# Patient Record
Sex: Male | Born: 1935 | Race: White | Hispanic: No | Smoking: Former smoker
Health system: Southern US, Community
[De-identification: ages and names within clinical notes are randomized; demographics above are authoritative.]

## PROBLEM LIST (undated history)

## (undated) DIAGNOSIS — IMO0001 Reserved for inherently not codable concepts without codable children: Secondary | ICD-10-CM

## (undated) DIAGNOSIS — E785 Hyperlipidemia, unspecified: Secondary | ICD-10-CM

## (undated) DIAGNOSIS — I10 Essential (primary) hypertension: Secondary | ICD-10-CM

## (undated) DIAGNOSIS — M1990 Unspecified osteoarthritis, unspecified site: Secondary | ICD-10-CM

## (undated) DIAGNOSIS — I2510 Atherosclerotic heart disease of native coronary artery without angina pectoris: Secondary | ICD-10-CM

## (undated) DIAGNOSIS — Z5189 Encounter for other specified aftercare: Secondary | ICD-10-CM

## (undated) DIAGNOSIS — C801 Malignant (primary) neoplasm, unspecified: Secondary | ICD-10-CM

## (undated) DIAGNOSIS — I739 Peripheral vascular disease, unspecified: Secondary | ICD-10-CM

## (undated) DIAGNOSIS — N289 Disorder of kidney and ureter, unspecified: Secondary | ICD-10-CM

## (undated) DIAGNOSIS — Z8613 Personal history of malaria: Secondary | ICD-10-CM

## (undated) HISTORY — DX: Disorder of kidney and ureter, unspecified: N28.9

## (undated) HISTORY — DX: Personal history of malaria: Z86.13

## (undated) HISTORY — DX: Hyperlipidemia, unspecified: E78.5

## (undated) HISTORY — PX: PACEMAKER INSERTION: SHX728

## (undated) HISTORY — DX: Essential (primary) hypertension: I10

## (undated) HISTORY — PX: INSERT / REPLACE / REMOVE PACEMAKER: SUR710

## (undated) HISTORY — PX: SPINE SURGERY: SHX786

## (undated) HISTORY — DX: Peripheral vascular disease, unspecified: I73.9

---
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
## 1999-02-05 HISTORY — PX: CORONARY ARTERY BYPASS GRAFT: SHX141

## 2009-02-04 HISTORY — PX: FEMORAL BYPASS: SHX50

## 2010-05-18 ENCOUNTER — Encounter: Payer: Medicare Other | Admitting: Cardiothoracic Surgery

## 2010-06-05 ENCOUNTER — Encounter: Payer: Medicare Other | Admitting: Cardiothoracic Surgery

## 2010-07-06 ENCOUNTER — Encounter: Payer: Medicare Other | Admitting: Cardiothoracic Surgery

## 2010-09-11 ENCOUNTER — Ambulatory Visit: Payer: Medicare Other | Admitting: Physical Medicine and Rehabilitation

## 2010-09-28 ENCOUNTER — Ambulatory Visit (INDEPENDENT_AMBULATORY_CARE_PROVIDER_SITE_OTHER): Payer: Medicare Other | Admitting: Internal Medicine

## 2010-09-28 ENCOUNTER — Encounter: Payer: Self-pay | Admitting: Internal Medicine

## 2010-09-28 DIAGNOSIS — E785 Hyperlipidemia, unspecified: Secondary | ICD-10-CM | POA: Insufficient documentation

## 2010-09-28 DIAGNOSIS — E1151 Type 2 diabetes mellitus with diabetic peripheral angiopathy without gangrene: Secondary | ICD-10-CM | POA: Insufficient documentation

## 2010-09-28 DIAGNOSIS — E1140 Type 2 diabetes mellitus with diabetic neuropathy, unspecified: Secondary | ICD-10-CM | POA: Insufficient documentation

## 2010-09-28 DIAGNOSIS — E1340 Other specified diabetes mellitus with diabetic neuropathy, unspecified: Secondary | ICD-10-CM

## 2010-09-28 DIAGNOSIS — I10 Essential (primary) hypertension: Secondary | ICD-10-CM

## 2010-09-28 DIAGNOSIS — IMO0001 Reserved for inherently not codable concepts without codable children: Secondary | ICD-10-CM

## 2010-09-28 DIAGNOSIS — E119 Type 2 diabetes mellitus without complications: Secondary | ICD-10-CM

## 2010-09-28 DIAGNOSIS — M48061 Spinal stenosis, lumbar region without neurogenic claudication: Secondary | ICD-10-CM | POA: Insufficient documentation

## 2010-09-28 DIAGNOSIS — Z79899 Other long term (current) drug therapy: Secondary | ICD-10-CM

## 2010-09-28 DIAGNOSIS — Z1211 Encounter for screening for malignant neoplasm of colon: Secondary | ICD-10-CM | POA: Insufficient documentation

## 2010-09-28 DIAGNOSIS — E1351 Other specified diabetes mellitus with diabetic peripheral angiopathy without gangrene: Secondary | ICD-10-CM | POA: Insufficient documentation

## 2010-09-28 DIAGNOSIS — I240 Acute coronary thrombosis not resulting in myocardial infarction: Secondary | ICD-10-CM

## 2010-09-28 NOTE — Patient Instructions (Addendum)
You may increase your hydrocodone usage to 3 daily, or even 4 if you need to get your pain under control.  Start with adding  one at bedtime , and then one during the day if needed.  You can add dulcolax or ex lax if needed for constipation to your prune intake.

## 2010-09-28 NOTE — Progress Notes (Signed)
  Subjective:    Patient ID: [name], male    DOB: [birth year], 77 y.o.   MRN: [id number]  HPI 77 yr old male presents for establishment of primary care.  He has no chief complaint today, but has chronic low back pain due to  spinal stenosiswhich has been treated medically, with prior gabapentin trial, now taking vicodin now with persistent back pain radiating to both legs  HIs medications are prescribed by Dr. Loistine Chance the new neurosurgon at [place].   He has been prescribed 3 daily but has only been taking 2 daily and his pain is not controlled.  He has not been sleeping well due to pain he  Denies urinary retention and constipation.  Review of Systems  Constitutional: Negative for fever, chills, diaphoresis, activity change, appetite change, fatigue and unexpected weight change.  HENT: Positive for hearing loss. Negative for ear pain, nosebleeds, congestion, sore throat, facial swelling, rhinorrhea, sneezing, drooling, mouth sores, trouble swallowing, neck pain, neck stiffness, dental problem, voice change, postnasal drip, sinus pressure, tinnitus and ear discharge.   Eyes: Negative for photophobia, pain, discharge, redness, itching and visual disturbance.  Respiratory: Negative for apnea, cough, choking, chest tightness, shortness of breath, wheezing and stridor.   Cardiovascular: Negative for chest pain, palpitations and leg swelling.  Gastrointestinal: Negative for nausea, vomiting, abdominal pain, diarrhea, constipation, blood in stool, abdominal distention, anal bleeding and rectal pain.  Genitourinary: Negative for dysuria, urgency, frequency, hematuria, flank pain, decreased urine volume, scrotal swelling, difficulty urinating and testicular pain.  Musculoskeletal: Positive for back pain. Negative for myalgias, joint swelling, arthralgias and gait problem.  Skin: Negative for color change, rash and wound.  Neurological: Negative for dizziness, tremors, seizures, syncope, speech  difficulty, weakness, light-headedness, numbness and headaches.  Psychiatric/Behavioral: Negative for suicidal ideas, hallucinations, behavioral problems, confusion, sleep disturbance, dysphoric mood, decreased concentration and agitation. The patient is not nervous/anxious.        Objective:   Physical Exam  Constitutional: He is oriented to person, place, and time. He appears well-developed and well-nourished.  HENT:  Head: Normocephalic.  Eyes: EOM are normal. Pupils are equal, round, and reactive to light.  Neck: Normal range of motion. Neck supple. No thyromegaly present.  Cardiovascular: Normal rate, regular rhythm and normal heart sounds.   Pulmonary/Chest: Effort normal and breath sounds normal.  Abdominal: Soft. Bowel sounds are normal.  Musculoskeletal: Normal range of motion.  Neurological: He is alert and oriented to person, place, and time.  Skin: Skin is warm and dry.  Psychiatric: He has a normal mood and affect.          Assessment & Plan:

## 2010-09-29 NOTE — Assessment & Plan Note (Signed)
He does not c remember the value of his last a1c which was one month ago.  Sugars running 150 to 200.   No changes today, repeat A1c in 3 months.  Up to date on eye exams

## 2010-09-29 NOTE — Assessment & Plan Note (Addendum)
Currently asymptomatic, tolerating statin ,ARB plavix , and beta blocker.

## 2010-09-29 NOTE — Assessment & Plan Note (Signed)
Well controlled currently on current medications

## 2010-09-29 NOTE — Assessment & Plan Note (Signed)
With persistent pain radiating to both legs,  Not controlled on two vicodin daily.  Discussed increasing his dose to three or 4 tablets daily

## 2010-09-29 NOTE — Assessment & Plan Note (Signed)
Prior gabapentin trial was apparently ineffective.  Symptoms are mild.

## 2010-10-30 ENCOUNTER — Other Ambulatory Visit: Payer: Self-pay | Admitting: Internal Medicine

## 2010-10-31 MED ORDER — CYCLOBENZAPRINE HCL 10 MG PO TABS: 10.0000 mg | ORAL_TABLET | Freq: Every day | ORAL | Status: DC

## 2010-10-31 MED ORDER — ATORVASTATIN CALCIUM 20 MG PO TABS: 20.0000 mg | ORAL_TABLET | Freq: Every day | ORAL | Status: DC

## 2010-11-13 ENCOUNTER — Other Ambulatory Visit (HOSPITAL_COMMUNITY): Payer: Self-pay | Admitting: Neurosurgery

## 2010-11-13 DIAGNOSIS — M48061 Spinal stenosis, lumbar region without neurogenic claudication: Secondary | ICD-10-CM

## 2010-12-18 ENCOUNTER — Ambulatory Visit (HOSPITAL_COMMUNITY)
Admission: RE | Admit: 2010-12-18 | Discharge: 2010-12-18 | Disposition: A | Discharge status: Home / Self Care | Payer: Medicare Other | Source: Ambulatory Visit | Attending: Neurosurgery | Admitting: Neurosurgery

## 2010-12-18 DIAGNOSIS — M51379 Other intervertebral disc degeneration, lumbosacral region without mention of lumbar back pain or lower extremity pain: Secondary | ICD-10-CM | POA: Insufficient documentation

## 2010-12-18 DIAGNOSIS — M519 Unspecified thoracic, thoracolumbar and lumbosacral intervertebral disc disorder: Secondary | ICD-10-CM | POA: Insufficient documentation

## 2010-12-18 DIAGNOSIS — M545 Low back pain, unspecified: Secondary | ICD-10-CM | POA: Insufficient documentation

## 2010-12-18 DIAGNOSIS — M48061 Spinal stenosis, lumbar region without neurogenic claudication: Secondary | ICD-10-CM | POA: Insufficient documentation

## 2010-12-18 DIAGNOSIS — M5137 Other intervertebral disc degeneration, lumbosacral region: Secondary | ICD-10-CM | POA: Insufficient documentation

## 2010-12-18 LAB — GLUCOSE, CAPILLARY
Glucose-Capillary: 166 mg/dL — ABNORMAL HIGH (ref 70–99)
Glucose-Capillary: 144 mg/dL — ABNORMAL HIGH (ref 70–99)

## 2010-12-18 MED ORDER — IOHEXOL 180 MG/ML  SOLN
20.0000 mL | Freq: Once | INTRAMUSCULAR | Status: AC | PRN
  Administered 2010-12-18: 20 mL via INTRATHECAL

## 2010-12-18 MED ORDER — OXYCODONE-ACETAMINOPHEN 5-325 MG PO TABS
ORAL_TABLET | ORAL | Status: AC
  Administered 2010-12-18: 1 via GASTROSTOMY
  Filled 2010-12-18: qty 1

## 2010-12-18 MED ORDER — MORPHINE SULFATE 2 MG/ML IJ SOLN: 2.0000 mg | INTRAMUSCULAR | Status: DC | PRN

## 2010-12-18 MED ORDER — OXYCODONE-ACETAMINOPHEN 5-325 MG PO TABS: 1.0000 | ORAL_TABLET | ORAL | Status: DC | PRN

## 2010-12-18 MED ORDER — DIAZEPAM 5 MG PO TABS
10.0000 mg | ORAL_TABLET | Freq: Once | ORAL | Status: AC
  Administered 2010-12-18: 10 mg via ORAL

## 2010-12-18 NOTE — Progress Notes (Signed)
Family at bedside.  Taking po's

## 2010-12-18 NOTE — Procedures (Signed)
Brief history: The patient complains of back pain.  Procedure: Lumbar myelogram  Surgeon: Dr. Earle Gell  Asst.: None  Level injected:l5/s1  Dye injected: 15 cc of Omnipaque 180  CSF appearance: Clear  Estimated blood loss minimal  Complications: None

## 2010-12-18 NOTE — H&P (Addendum)
  Brief history: The patient is an 77 year old white male with back hip and leg pain.  Past medical history, past surgical history, present medications, allergies, family medical history, social history: As per 11/13/2010 office note   Physical exam: Has not changed and is as per his 11/13/2010 physical  Assessment and plan: Lumbar spinal stenosis lumbago neurogenic claudication. I discussed situation with patient. We discussed further workup with a lumbar myelo CT. I've explained the procedure the risks benefits and alternatives as well as expected outcome of the procedure. The patient was to proceed.

## 2010-12-31 ENCOUNTER — Other Ambulatory Visit: Payer: Self-pay | Admitting: Neurosurgery

## 2010-12-31 ENCOUNTER — Ambulatory Visit (INDEPENDENT_AMBULATORY_CARE_PROVIDER_SITE_OTHER): Payer: Medicare Other | Admitting: Internal Medicine

## 2010-12-31 ENCOUNTER — Encounter: Payer: Self-pay | Admitting: Internal Medicine

## 2010-12-31 DIAGNOSIS — N183 Chronic kidney disease, stage 3 unspecified: Secondary | ICD-10-CM

## 2010-12-31 DIAGNOSIS — E1121 Type 2 diabetes mellitus with diabetic nephropathy: Secondary | ICD-10-CM | POA: Insufficient documentation

## 2010-12-31 DIAGNOSIS — E1349 Other specified diabetes mellitus with other diabetic neurological complication: Secondary | ICD-10-CM

## 2010-12-31 DIAGNOSIS — E1142 Type 2 diabetes mellitus with diabetic polyneuropathy: Secondary | ICD-10-CM

## 2010-12-31 DIAGNOSIS — I739 Peripheral vascular disease, unspecified: Secondary | ICD-10-CM | POA: Insufficient documentation

## 2010-12-31 DIAGNOSIS — E119 Type 2 diabetes mellitus without complications: Secondary | ICD-10-CM

## 2010-12-31 DIAGNOSIS — E1340 Other specified diabetes mellitus with diabetic neuropathy, unspecified: Secondary | ICD-10-CM

## 2010-12-31 DIAGNOSIS — E785 Hyperlipidemia, unspecified: Secondary | ICD-10-CM

## 2010-12-31 DIAGNOSIS — E1149 Type 2 diabetes mellitus with other diabetic neurological complication: Secondary | ICD-10-CM

## 2010-12-31 MED ORDER — OXYCODONE-ACETAMINOPHEN 7.5-325 MG PO TABS: 1.0000 | ORAL_TABLET | ORAL | Status: DC | PRN

## 2010-12-31 NOTE — Patient Instructions (Addendum)
Try the percocet up to 4 daily for pain .   Please use dulcolax  ( a stimulant laxative,  or Ex L ax or Sennakot)   every night to prevent constipation IF you use  the percocet.   Pick up a small paperback that estimates the carbohydrate content of common foods.   We are checking an LDL and your HgbA1c today.  Please send me a log of your last 2 weeks of blood sugars so we can make some adjustments to your insulin if needed..  My goals is to reduce your injections to twice daily and to keep your fasting sugars between 120 to 150,  2 hour Post prandials of 120 to 150 as well

## 2010-12-31 NOTE — Progress Notes (Signed)
Subjective:    Patient ID: [name], male    DOB: [birth year], 77 y.o.   MRN: [id number]  HPI  77 yo white male with history of chronic back pain secondary to spinal stenosis here for followup on medical conditions which include diabetes.  He had a recent positive myelogram and is scheduled for back surgery  by Earle Gell on Dec 12th .  His pain is currently not relieved with vicodin.  His only comfortable position is sitting in an upright chair.  He has been sleeping in a recliner.  He denies bowel or bladder incontinence. Has one or two bowel movements daily normally. He hs had preoperative clearance by his cardiologist.  His second issue is Diabetes.  His sugars have been reported as uncontrolled because he has been following the advice of his daughter who is a physician and  has been advocating looser glycemic control.  His fasting glucoses have been averaging from 180s to 200s and he has been using a sliding scale insulin regimen of 70/30 18 units three times daily .    He saw his cardiologist Paraschos in October for preoperative clearance.  Received flu shot at [facility]. Pneumonia vaccine has been at least twice.  Recalls having a tetanus shot in [place] before his move from [place].   Past Medical History  Diagnosis Date  . H/O: malaria 78    in [place]  . Hyperlipidemia   . Hypertension   . Diabetes mellitus   . Renal insufficiency     by Nov 2012 labs, no old records available  . Peripheral artery disease     folowed by [place] bypass 2011 [place]   Current Outpatient Prescriptions on File Prior to Visit  Medication Sig Dispense Refill  . amLODipine (NORVASC) 10 MG tablet Take 10 mg by mouth daily.        Marland Kitchen aspirin 325 MG tablet Take 325 mg by mouth daily.        Marland Kitchen atorvastatin (LIPITOR) 20 MG tablet Take 1 tablet (20 mg total) by mouth daily.  90 tablet  2  . Calcium Carbonate-Vitamin D (CALCIUM + D) 600-200 MG-UNIT TABS Take 1 tablet by mouth 2 (two) times  daily.        . carbamazepine (TEGRETOL) 200 MG tablet Take 200 mg by mouth daily.        . clopidogrel (PLAVIX) 75 MG tablet Take 75 mg by mouth daily.        . cyclobenzaprine (FLEXERIL) 10 MG tablet Take 1 tablet (10 mg total) by mouth daily.  90 tablet  0  . fenofibrate (TRICOR) 48 MG tablet Take 48 mg by mouth daily.        . Insulin Aspart Prot & Aspart (NOVOLOG MIX 70/30 FLEXPEN [place]) Inject 17 Units into the skin 3 (three) times daily before meals.        . metoprolol (LOPRESSOR) 50 MG tablet Take 50 mg by mouth 2 (two) times daily.        . valsartan (DIOVAN) 160 MG tablet Take 160 mg by mouth daily.           Review of Systems  Constitutional: Negative for fever, chills, diaphoresis, activity change, appetite change, fatigue and unexpected weight change.  HENT: Positive for hearing loss. Negative for ear pain, nosebleeds, congestion, sore throat, facial swelling, rhinorrhea, sneezing, drooling, mouth sores, trouble swallowing, neck pain, neck stiffness, dental problem, voice change, postnasal drip, sinus pressure, tinnitus and ear discharge.  Eyes: Negative for photophobia, pain, discharge, redness, itching and visual disturbance.  Respiratory: Negative for apnea, cough, choking, chest tightness, shortness of breath, wheezing and stridor.   Cardiovascular: Negative for chest pain, palpitations and leg swelling.  Gastrointestinal: Negative for nausea, vomiting, abdominal pain, diarrhea, constipation, blood in stool, abdominal distention, anal bleeding and rectal pain.  Genitourinary: Negative for dysuria, urgency, frequency, hematuria, flank pain, decreased urine volume, scrotal swelling, difficulty urinating and testicular pain.  Musculoskeletal: Positive for back pain. Negative for myalgias, joint swelling, arthralgias and gait problem.  Skin: Negative for color change, rash and wound.  Neurological: Negative for dizziness, tremors, seizures, syncope, speech difficulty, weakness,  light-headedness, numbness and headaches.  Psychiatric/Behavioral: Negative for suicidal ideas, hallucinations, behavioral problems, confusion, sleep disturbance, dysphoric mood, decreased concentration and agitation. The patient is not nervous/anxious.        Objective:   Physical Exam  Constitutional: He is oriented to person, place, and time. He appears well-developed and well-nourished.  HENT:  Head: Normocephalic.  Eyes: EOM are normal. Pupils are equal, round, and reactive to light.  Neck: Normal range of motion. Neck supple. No thyromegaly present.  Cardiovascular: Normal rate, regular rhythm and normal heart sounds.   Pulmonary/Chest: Effort normal and breath sounds normal.  Abdominal: Soft. Bowel sounds are normal.  Musculoskeletal: Normal range of motion.  Neurological: He is alert and oriented to person, place, and time.  Skin: Skin is warm and dry.  Psychiatric: He has a normal mood and affect.          Assessment & Plan:

## 2011-01-01 ENCOUNTER — Encounter: Payer: Self-pay | Admitting: Internal Medicine

## 2011-01-01 ENCOUNTER — Encounter (HOSPITAL_COMMUNITY): Payer: Self-pay | Admitting: Pharmacy Technician

## 2011-01-01 LAB — MICROALBUMIN / CREATININE URINE RATIO: Microalb Creat Ratio: 3.2 mg/g (ref 0.0–30.0)

## 2011-01-01 LAB — HEMOGLOBIN A1C: Hgb A1c MFr Bld: 8 % — ABNORMAL HIGH (ref 4.6–6.5)

## 2011-01-01 MED ORDER — ATORVASTATIN CALCIUM 40 MG PO TABS: 40.0000 mg | ORAL_TABLET | Freq: Every day | ORAL | Status: DC

## 2011-01-01 NOTE — Assessment & Plan Note (Signed)
pER PATIENT, HE HAS HAD STABLE KIDNEY DISEASE. HOWEVER NO RECORDS FOR REVIEW ARE CURRENTLY AVAILABLE.

## 2011-01-01 NOTE — Assessment & Plan Note (Signed)
His LDL is 98.  Will increase his Lipitor to 40 mg daily

## 2011-01-01 NOTE — Progress Notes (Signed)
Addended by: Deborra Medina on: 01/01/2011 11:15 PM   Modules accepted: Orders

## 2011-01-01 NOTE — Assessment & Plan Note (Signed)
We ( again ) discussed the goals of glycemic control in the elderly,  the noncritical patient, and the postoperative patient. I reiterated that his current blood sugars will not likely correspond to a hgba1c of 7.0, and recommended that he send me a 2 week log of his blood sugars so I can adjust his regimen and eventually discontinue the lunchtime dose of 70/30, which I esplained is going to overlap with his other insulin doses and cause lows.

## 2011-01-02 ENCOUNTER — Other Ambulatory Visit: Payer: Self-pay | Admitting: Internal Medicine

## 2011-01-02 ENCOUNTER — Telehealth: Payer: Self-pay | Admitting: Internal Medicine

## 2011-01-02 MED ORDER — INSULIN NPH ISOPHANE & REGULAR (70-30) 100 UNIT/ML ~~LOC~~ SUSP: SUBCUTANEOUS | Status: DC

## 2011-01-02 NOTE — Telephone Encounter (Signed)
Received his log of blood sugars,  He needs to reduce his insulin does to twice daily, before breakfast and before dinner.  Start with 20 units of insulin  before breakfast and  26 units before dinner.  Rather than giving him an absolute number of carbohydrates,  I would estimate a ballpark of around80  carbs per day, which he can do if he is limiting his starches to two or 3 servings per day and avoiding cereal for breakfast Going forward, I want him to substitute a  2 hour post prandial blood sugar once a day , pick a different meal each time so we get a sense of how the new insulin regimen controls his sugars after a meal.

## 2011-01-02 NOTE — Telephone Encounter (Signed)
Patient notified. He will call and let us know what his readings are.

## 2011-01-03 ENCOUNTER — Telehealth: Payer: Self-pay | Admitting: Internal Medicine

## 2011-01-03 NOTE — Telephone Encounter (Signed)
Dr. Derrel Nip has already sent in Rx for patient.

## 2011-01-03 NOTE — Telephone Encounter (Signed)
[phone] Pt came in he needs rx for atorvastatin tab 40mg  take 1 daily.  What he has now is atorvastatin tab 20mg   He takes 2 daily Please send to [facility]

## 2011-01-09 ENCOUNTER — Encounter (HOSPITAL_COMMUNITY): Payer: Self-pay

## 2011-01-09 ENCOUNTER — Other Ambulatory Visit: Payer: Self-pay

## 2011-01-09 ENCOUNTER — Encounter (HOSPITAL_COMMUNITY)
Admission: RE | Admit: 2011-01-09 | Discharge: 2011-01-09 | Disposition: A | Discharge status: Home / Self Care | Payer: Medicare Other | Source: Ambulatory Visit | Attending: Neurosurgery | Admitting: Neurosurgery

## 2011-01-09 ENCOUNTER — Encounter (HOSPITAL_COMMUNITY)
Admission: RE | Admit: 2011-01-09 | Discharge: 2011-01-09 | Disposition: A | Discharge status: Home / Self Care | Payer: Medicare Other | Source: Ambulatory Visit | Attending: Anesthesiology | Admitting: Anesthesiology

## 2011-01-09 HISTORY — DX: Reserved for inherently not codable concepts without codable children: IMO0001

## 2011-01-09 HISTORY — DX: Encounter for other specified aftercare: Z51.89

## 2011-01-09 LAB — BASIC METABOLIC PANEL
BUN: 31 mg/dL — ABNORMAL HIGH (ref 6–23)
CO2: 24 mEq/L (ref 19–32)
Calcium: 9.9 mg/dL (ref 8.4–10.5)
Creatinine, Ser: 1.71 mg/dL — ABNORMAL HIGH (ref 0.50–1.35)
Glucose, Bld: 132 mg/dL — ABNORMAL HIGH (ref 70–99)

## 2011-01-09 LAB — CBC
MCH: 31.7 pg (ref 26.0–34.0)
MCHC: 32.5 g/dL (ref 30.0–36.0)
MCV: 97.6 fL (ref 78.0–100.0)
Platelets: 215 10*3/uL (ref 150–400)
RBC: 4.1 MIL/uL — ABNORMAL LOW (ref 4.22–5.81)
RDW: 14.4 % (ref 11.5–15.5)

## 2011-01-09 LAB — SURGICAL PCR SCREEN: MRSA, PCR: NEGATIVE

## 2011-01-09 NOTE — Progress Notes (Signed)
Cardiac Clearance from Dr Saralyn Pilar received from [facility].  ICD order sheet faxed to Dr Saralyn Pilar at [facility].

## 2011-01-09 NOTE — Pre-Procedure Instructions (Signed)
[place]  01/09/2011   Your procedure is scheduled on:  DEC. 12  Report to [place] at 0930 AM.  Call this number if you have problems the morning of surgery: [phone]   Remember:   Do not eat food:After Midnight.  May have clear liquids: up to 4 Hours before arrival.  Clear liquids include soda, tea, black coffee, apple or grape juice, broth.  Take these medicines the morning of surgery with A SIP OF WATER: NORVASC,METOPROLOL,PERCOCET,STOP ASPIRIN AND PLAVIX   Do not wear jewelry, make-up or nail polish.  Do not wear lotions, powders, or perfumes. You may wear deodorant.  Do not shave 48 hours prior to surgery.  Do not bring valuables to the hospital.  Contacts, dentures or bridgework may not be worn into surgery.  Leave suitcase in the car. After surgery it may be brought to your room.  For patients admitted to the hospital, checkout time is 11:00 AM the day of discharge.   Patients discharged the day of surgery will not be allowed to drive home.  Name and phone number of your driver: PHYLIS  [phone]  Special Instructions: CHG Shower Use Special Wash: 1/2 bottle night before surgery and 1/2 bottle morning of surgery.   Please read over the following fact sheets that you were given: Pain Booklet, Blood Transfusion Information and Surgical Site Infection Prevention

## 2011-01-10 NOTE — Progress Notes (Signed)
Left message for [facility] at Dr. Saralyn Pilar office about AICD form- need to have it faxed back.

## 2011-01-15 ENCOUNTER — Other Ambulatory Visit: Payer: Self-pay | Admitting: Internal Medicine

## 2011-01-15 MED ORDER — CEFAZOLIN SODIUM-DEXTROSE 2-3 GM-% IV SOLR
2.0000 g | INTRAVENOUS | Status: AC
  Administered 2011-01-16: 2 g via INTRAVENOUS
  Filled 2011-01-15: qty 50

## 2011-01-15 MED ORDER — INSULIN ASPART PROT & ASPART (70-30 MIX) 100 UNIT/ML ~~LOC~~ SUSP: SUBCUTANEOUS | Status: DC

## 2011-01-16 ENCOUNTER — Encounter (HOSPITAL_COMMUNITY): Payer: Self-pay | Admitting: Anesthesiology

## 2011-01-16 ENCOUNTER — Inpatient Hospital Stay (HOSPITAL_COMMUNITY): Payer: Medicare Other | Admitting: Anesthesiology

## 2011-01-16 ENCOUNTER — Inpatient Hospital Stay (HOSPITAL_COMMUNITY): Payer: Medicare Other

## 2011-01-16 ENCOUNTER — Encounter (HOSPITAL_COMMUNITY): Payer: Self-pay

## 2011-01-16 ENCOUNTER — Encounter (HOSPITAL_COMMUNITY)
Admission: RE | Disposition: A | Discharge status: Home / Self Care | Payer: Self-pay | Source: Ambulatory Visit | Attending: Neurosurgery

## 2011-01-16 ENCOUNTER — Telehealth: Payer: Self-pay | Admitting: Internal Medicine

## 2011-01-16 ENCOUNTER — Inpatient Hospital Stay (HOSPITAL_COMMUNITY)
Admission: RE | Admit: 2011-01-16 | Discharge: 2011-01-17 | DRG: 491 | Disposition: A | Discharge status: Home / Self Care | Payer: Medicare Other | Source: Ambulatory Visit | Attending: Neurosurgery | Admitting: Neurosurgery

## 2011-01-16 DIAGNOSIS — I10 Essential (primary) hypertension: Secondary | ICD-10-CM | POA: Diagnosis present

## 2011-01-16 DIAGNOSIS — Z794 Long term (current) use of insulin: Secondary | ICD-10-CM

## 2011-01-16 DIAGNOSIS — Z87891 Personal history of nicotine dependence: Secondary | ICD-10-CM

## 2011-01-16 DIAGNOSIS — IMO0002 Reserved for concepts with insufficient information to code with codable children: Secondary | ICD-10-CM | POA: Diagnosis present

## 2011-01-16 DIAGNOSIS — E785 Hyperlipidemia, unspecified: Secondary | ICD-10-CM

## 2011-01-16 DIAGNOSIS — M48062 Spinal stenosis, lumbar region with neurogenic claudication: Principal | ICD-10-CM | POA: Diagnosis present

## 2011-01-16 DIAGNOSIS — I739 Peripheral vascular disease, unspecified: Secondary | ICD-10-CM | POA: Diagnosis present

## 2011-01-16 DIAGNOSIS — Z7982 Long term (current) use of aspirin: Secondary | ICD-10-CM

## 2011-01-16 DIAGNOSIS — Z950 Presence of cardiac pacemaker: Secondary | ICD-10-CM

## 2011-01-16 DIAGNOSIS — Z951 Presence of aortocoronary bypass graft: Secondary | ICD-10-CM

## 2011-01-16 DIAGNOSIS — E119 Type 2 diabetes mellitus without complications: Secondary | ICD-10-CM | POA: Diagnosis present

## 2011-01-16 DIAGNOSIS — I2510 Atherosclerotic heart disease of native coronary artery without angina pectoris: Secondary | ICD-10-CM | POA: Diagnosis present

## 2011-01-16 DIAGNOSIS — Z79899 Other long term (current) drug therapy: Secondary | ICD-10-CM

## 2011-01-16 HISTORY — PX: LUMBAR LAMINECTOMY/DECOMPRESSION MICRODISCECTOMY: SHX5026

## 2011-01-16 HISTORY — DX: Atherosclerotic heart disease of native coronary artery without angina pectoris: I25.10

## 2011-01-16 LAB — GLUCOSE, CAPILLARY
Glucose-Capillary: 102 mg/dL — ABNORMAL HIGH (ref 70–99)
Glucose-Capillary: 123 mg/dL — ABNORMAL HIGH (ref 70–99)

## 2011-01-16 SURGERY — LUMBAR LAMINECTOMY/DECOMPRESSION MICRODISCECTOMY
Anesthesia: General | Site: Back | Wound class: Clean

## 2011-01-16 MED ORDER — ACETAMINOPHEN 650 MG RE SUPP: 650.0000 mg | RECTAL | Status: DC | PRN

## 2011-01-16 MED ORDER — LACTATED RINGERS IV SOLN: INTRAVENOUS | Status: DC

## 2011-01-16 MED ORDER — THROMBIN 5000 UNITS EX KIT
PACK | CUTANEOUS | Status: DC | PRN
  Administered 2011-01-16: 5000 [IU] via TOPICAL

## 2011-01-16 MED ORDER — DOCUSATE SODIUM 100 MG PO CAPS
100.0000 mg | ORAL_CAPSULE | Freq: Two times a day (BID) | ORAL | Status: DC
  Administered 2011-01-16: 100 mg via ORAL
  Filled 2011-01-16: qty 1

## 2011-01-16 MED ORDER — EPHEDRINE SULFATE 50 MG/ML IJ SOLN
INTRAMUSCULAR | Status: DC | PRN
  Administered 2011-01-16: 10 mg via INTRAVENOUS
  Administered 2011-01-16 (×3): 5 mg via INTRAVENOUS

## 2011-01-16 MED ORDER — SODIUM CHLORIDE 0.9 % IR SOLN
Status: DC | PRN
  Administered 2011-01-16: 1000 mL

## 2011-01-16 MED ORDER — PHENOL 1.4 % MT LIQD: 1.0000 | OROMUCOSAL | Status: DC | PRN

## 2011-01-16 MED ORDER — HYDROCODONE-ACETAMINOPHEN 5-325 MG PO TABS: 1.0000 | ORAL_TABLET | ORAL | Status: DC | PRN

## 2011-01-16 MED ORDER — OLMESARTAN MEDOXOMIL 20 MG PO TABS
20.0000 mg | ORAL_TABLET | Freq: Every day | ORAL | Status: DC
  Administered 2011-01-16: 20 mg via ORAL
  Filled 2011-01-16 (×2): qty 1

## 2011-01-16 MED ORDER — FENTANYL CITRATE 0.05 MG/ML IJ SOLN
INTRAMUSCULAR | Status: DC | PRN
  Administered 2011-01-16: 50 ug via INTRAVENOUS
  Administered 2011-01-16: 100 ug via INTRAVENOUS

## 2011-01-16 MED ORDER — AMLODIPINE BESYLATE 10 MG PO TABS
10.0000 mg | ORAL_TABLET | Freq: Every day | ORAL | Status: DC
Start: 2011-01-16 — End: 2011-01-17
  Filled 2011-01-16 (×2): qty 1

## 2011-01-16 MED ORDER — ONDANSETRON HCL 4 MG/2ML IJ SOLN: 4.0000 mg | Freq: Once | INTRAMUSCULAR | Status: DC | PRN

## 2011-01-16 MED ORDER — HYDROMORPHONE HCL PF 1 MG/ML IJ SOLN: 0.2500 mg | INTRAMUSCULAR | Status: DC | PRN

## 2011-01-16 MED ORDER — PROPOFOL 10 MG/ML IV EMUL
INTRAVENOUS | Status: DC | PRN
  Administered 2011-01-16: 200 mg via INTRAVENOUS

## 2011-01-16 MED ORDER — HEMOSTATIC AGENTS (NO CHARGE) OPTIME
TOPICAL | Status: DC | PRN
  Administered 2011-01-16: 1 via TOPICAL

## 2011-01-16 MED ORDER — ONDANSETRON HCL 4 MG/2ML IJ SOLN: 4.0000 mg | INTRAMUSCULAR | Status: DC | PRN

## 2011-01-16 MED ORDER — LACTATED RINGERS IV SOLN
INTRAVENOUS | Status: DC | PRN
  Administered 2011-01-16 (×2): via INTRAVENOUS

## 2011-01-16 MED ORDER — FENOFIBRATE 54 MG PO TABS
54.0000 mg | ORAL_TABLET | Freq: Every day | ORAL | Status: DC
  Administered 2011-01-16: 54 mg via ORAL
  Filled 2011-01-16 (×2): qty 1

## 2011-01-16 MED ORDER — PHENYLEPHRINE HCL 10 MG/ML IJ SOLN
INTRAMUSCULAR | Status: DC | PRN
  Administered 2011-01-16: 80 ug via INTRAVENOUS
  Administered 2011-01-16: 40 ug via INTRAVENOUS
  Administered 2011-01-16: 80 ug via INTRAVENOUS

## 2011-01-16 MED ORDER — BUPIVACAINE LIPOSOME 1.3 % IJ SUSP
20.0000 mL | Freq: Once | INTRAMUSCULAR | Status: DC
  Filled 2011-01-16: qty 20

## 2011-01-16 MED ORDER — INSULIN ASPART 100 UNIT/ML ~~LOC~~ SOLN
0.0000 [IU] | Freq: Three times a day (TID) | SUBCUTANEOUS | Status: DC
  Administered 2011-01-17: 2 [IU] via SUBCUTANEOUS

## 2011-01-16 MED ORDER — OXYCODONE-ACETAMINOPHEN 5-325 MG PO TABS
1.0000 | ORAL_TABLET | ORAL | Status: DC | PRN
  Administered 2011-01-16 – 2011-01-17 (×2): 1 via ORAL
  Filled 2011-01-16 (×2): qty 1

## 2011-01-16 MED ORDER — DIAZEPAM 5 MG PO TABS
5.0000 mg | ORAL_TABLET | Freq: Four times a day (QID) | ORAL | Status: DC | PRN
  Administered 2011-01-16 – 2011-01-17 (×2): 5 mg via ORAL
  Filled 2011-01-16 (×2): qty 1

## 2011-01-16 MED ORDER — METOPROLOL TARTRATE 50 MG PO TABS
50.0000 mg | ORAL_TABLET | Freq: Two times a day (BID) | ORAL | Status: DC
  Filled 2011-01-16 (×3): qty 1

## 2011-01-16 MED ORDER — ACETAMINOPHEN 325 MG PO TABS: 650.0000 mg | ORAL_TABLET | ORAL | Status: DC | PRN

## 2011-01-16 MED ORDER — INSULIN ASPART PROT & ASPART (70-30 MIX) 100 UNIT/ML ~~LOC~~ SUSP
20.0000 [IU] | Freq: Two times a day (BID) | SUBCUTANEOUS | Status: DC
  Administered 2011-01-16 – 2011-01-17 (×2): 20 [IU] via SUBCUTANEOUS
  Filled 2011-01-16 (×3): qty 3

## 2011-01-16 MED ORDER — BUPIVACAINE-EPINEPHRINE PF 0.5-1:200000 % IJ SOLN
INTRAMUSCULAR | Status: DC | PRN
  Administered 2011-01-16: 10 mL

## 2011-01-16 MED ORDER — ROCURONIUM BROMIDE 100 MG/10ML IV SOLN
INTRAVENOUS | Status: DC | PRN
  Administered 2011-01-16: 50 mg via INTRAVENOUS

## 2011-01-16 MED ORDER — CEFAZOLIN SODIUM 1-5 GM-% IV SOLN
1.0000 g | Freq: Three times a day (TID) | INTRAVENOUS | Status: AC
  Administered 2011-01-16 – 2011-01-17 (×2): 1 g via INTRAVENOUS
  Filled 2011-01-16 (×2): qty 50

## 2011-01-16 MED ORDER — MENTHOL 3 MG MT LOZG: 1.0000 | LOZENGE | OROMUCOSAL | Status: DC | PRN

## 2011-01-16 MED ORDER — INSULIN ASPART 100 UNIT/ML ~~LOC~~ SOLN: 0.0000 [IU] | Freq: Every day | SUBCUTANEOUS | Status: DC

## 2011-01-16 MED ORDER — MORPHINE SULFATE 4 MG/ML IJ SOLN: 1.0000 mg | INTRAMUSCULAR | Status: DC | PRN

## 2011-01-16 MED ORDER — BACITRACIN ZINC 500 UNIT/GM EX OINT
TOPICAL_OINTMENT | CUTANEOUS | Status: DC | PRN
  Administered 2011-01-16: 1 via TOPICAL

## 2011-01-16 MED ORDER — CARBAMAZEPINE 200 MG PO TABS
200.0000 mg | ORAL_TABLET | Freq: Every day | ORAL | Status: DC
Start: 2011-01-16 — End: 2011-01-17
  Administered 2011-01-16: 200 mg via ORAL
  Filled 2011-01-16 (×2): qty 1

## 2011-01-16 MED ORDER — SIMVASTATIN 40 MG PO TABS
40.0000 mg | ORAL_TABLET | Freq: Every day | ORAL | Status: DC
  Administered 2011-01-16: 40 mg via ORAL
  Filled 2011-01-16 (×2): qty 1

## 2011-01-16 MED ORDER — ZOLPIDEM TARTRATE 5 MG PO TABS: 5.0000 mg | ORAL_TABLET | Freq: Every evening | ORAL | Status: DC | PRN

## 2011-01-16 MED ORDER — INSULIN ASPART 100 UNIT/ML ~~LOC~~ SOLN
0.0000 [IU] | SUBCUTANEOUS | Status: DC
  Filled 2011-01-16: qty 3

## 2011-01-16 MED ORDER — SODIUM CHLORIDE 0.9 % IR SOLN
Status: DC | PRN
  Administered 2011-01-16: 14:00:00

## 2011-01-16 MED ORDER — BUPIVACAINE LIPOSOME 1.3 % IJ SUSP
INTRAMUSCULAR | Status: DC | PRN
  Administered 2011-01-16: 20 mL

## 2011-01-16 SURGICAL SUPPLY — 53 items
BAG DECANTER FOR FLEXI CONT (MISCELLANEOUS) ×2 IMPLANT
BENZOIN TINCTURE PRP APPL 2/3 (GAUZE/BANDAGES/DRESSINGS) ×2 IMPLANT
BLADE SURG ROTATE 9660 (MISCELLANEOUS) IMPLANT
BRUSH SCRUB EZ PLAIN DRY (MISCELLANEOUS) ×2 IMPLANT
BUR ACORN 6.0 (BURR) ×2 IMPLANT
BUR MATCHSTICK NEURO 3.0 LAGG (BURR) ×2 IMPLANT
CANISTER SUCTION 2500CC (MISCELLANEOUS) ×2 IMPLANT
CLOTH BEACON ORANGE TIMEOUT ST (SAFETY) ×2 IMPLANT
CONT SPEC 4OZ CLIKSEAL STRL BL (MISCELLANEOUS) ×2 IMPLANT
DRAPE LAPAROTOMY 100X72X124 (DRAPES) ×2 IMPLANT
DRAPE MICROSCOPE LEICA (MISCELLANEOUS) ×2 IMPLANT
DRAPE POUCH INSTRU U-SHP 10X18 (DRAPES) ×2 IMPLANT
DRAPE SURG 17X23 STRL (DRAPES) ×8 IMPLANT
ELECT BLADE 4.0 EZ CLEAN MEGAD (MISCELLANEOUS) ×2
ELECT REM PT RETURN 9FT ADLT (ELECTROSURGICAL) ×2
ELECTRODE BLDE 4.0 EZ CLN MEGD (MISCELLANEOUS) ×1 IMPLANT
ELECTRODE REM PT RTRN 9FT ADLT (ELECTROSURGICAL) ×1 IMPLANT
GAUZE SPONGE 4X4 12PLY STRL LF (GAUZE/BANDAGES/DRESSINGS) ×2 IMPLANT
GAUZE SPONGE 4X4 16PLY XRAY LF (GAUZE/BANDAGES/DRESSINGS) ×2 IMPLANT
GLOVE BIO SURGEON STRL SZ8.5 (GLOVE) ×2 IMPLANT
GLOVE BIOGEL PI IND STRL 7.0 (GLOVE) ×1 IMPLANT
GLOVE BIOGEL PI INDICATOR 7.0 (GLOVE) ×1
GLOVE EXAM NITRILE LRG STRL (GLOVE) IMPLANT
GLOVE EXAM NITRILE MD LF STRL (GLOVE) ×2 IMPLANT
GLOVE EXAM NITRILE XL STR (GLOVE) IMPLANT
GLOVE EXAM NITRILE XS STR PU (GLOVE) IMPLANT
GLOVE SS BIOGEL STRL SZ 8 (GLOVE) ×1 IMPLANT
GLOVE SUPERSENSE BIOGEL SZ 8 (GLOVE) ×1
GLOVE SURG SS PI 6.5 STRL IVOR (GLOVE) ×2 IMPLANT
GOWN BRE IMP SLV AUR LG STRL (GOWN DISPOSABLE) ×2 IMPLANT
GOWN BRE IMP SLV AUR XL STRL (GOWN DISPOSABLE) ×6 IMPLANT
GOWN STRL REIN 2XL LVL4 (GOWN DISPOSABLE) IMPLANT
KIT BASIN OR (CUSTOM PROCEDURE TRAY) ×2 IMPLANT
KIT ROOM TURNOVER OR (KITS) ×2 IMPLANT
NEEDLE HYPO 21X1.5 SAFETY (NEEDLE) ×2 IMPLANT
NEEDLE HYPO 22GX1.5 SAFETY (NEEDLE) ×2 IMPLANT
NS IRRIG 1000ML POUR BTL (IV SOLUTION) ×2 IMPLANT
PACK LAMINECTOMY NEURO (CUSTOM PROCEDURE TRAY) ×2 IMPLANT
PAD ARMBOARD 7.5X6 YLW CONV (MISCELLANEOUS) ×6 IMPLANT
PATTIES SURGICAL .5 X1 (DISPOSABLE) IMPLANT
RUBBERBAND STERILE (MISCELLANEOUS) ×4 IMPLANT
SPONGE GAUZE 4X4 12PLY (GAUZE/BANDAGES/DRESSINGS) ×2 IMPLANT
SPONGE SURGIFOAM ABS GEL SZ50 (HEMOSTASIS) ×2 IMPLANT
STRIP CLOSURE SKIN 1/2X4 (GAUZE/BANDAGES/DRESSINGS) ×2 IMPLANT
SUT VIC AB 1 CT1 18XBRD ANBCTR (SUTURE) ×2 IMPLANT
SUT VIC AB 1 CT1 8-18 (SUTURE) ×2
SUT VIC AB 2-0 CP2 18 (SUTURE) ×4 IMPLANT
SYR 20CC LL (SYRINGE) ×2 IMPLANT
SYR 20ML ECCENTRIC (SYRINGE) ×2 IMPLANT
TAPE HYPAFIX 4 X10 (GAUZE/BANDAGES/DRESSINGS) ×2 IMPLANT
TOWEL OR 17X24 6PK STRL BLUE (TOWEL DISPOSABLE) ×2 IMPLANT
TOWEL OR 17X26 10 PK STRL BLUE (TOWEL DISPOSABLE) ×2 IMPLANT
WATER STERILE IRR 1000ML POUR (IV SOLUTION) ×2 IMPLANT

## 2011-01-16 NOTE — Telephone Encounter (Signed)
Reviewed his sugars,  Pleas ask him to increase his evening dose to 28 units. Regarding the insulni bottle he received due to our error in ordering it instead of a pen,  Please apologizem  and We cannot accept the insulin either,  It will keep for a while,  He may need it if he ever enters the "doughnut hole"

## 2011-01-16 NOTE — Anesthesia Preprocedure Evaluation (Addendum)
Anesthesia Evaluation  Patient identified by MRN, date of birth, ID band Patient awake    Reviewed: Allergy & Precautions, H&P , NPO status , Patient's Chart, lab work & pertinent test results  Airway Mallampati: I TM Distance: >3 FB Neck ROM: full    Dental  (+) Teeth Intact   Pulmonary former smoker   Pulmonary exam normal       Cardiovascular hypertension, + CAD and + CABG + pacemaker regular Normal    Neuro/Psych Negative Neurological ROS     GI/Hepatic negative GI ROS, Neg liver ROS,   Endo/Other  Diabetes mellitus-, Well Controlled, Type 2, Insulin Dependent  Renal/GU negative Renal ROS  Genitourinary negative   Musculoskeletal   Abdominal   Peds  Hematology negative hematology ROS (+)   Anesthesia Other Findings   Reproductive/Obstetrics                          Anesthesia Physical Anesthesia Plan  ASA: III  Anesthesia Plan: General ETT and General   Post-op Pain Management:    Induction: Intravenous  Airway Management Planned: Oral ETT  Additional Equipment:   Intra-op Plan:   Post-operative Plan:   Informed Consent: I have reviewed the patients History and Physical, chart, labs and discussed the procedure including the risks, benefits and alternatives for the proposed anesthesia with the patient or authorized representative who has indicated his/her understanding and acceptance.     Plan Discussed with: Anesthesiologist, CRNA and Surgeon  Anesthesia Plan Comments:         Anesthesia Quick Evaluation

## 2011-01-16 NOTE — Preoperative (Signed)
Beta Blockers   Reason not to administer Beta Blockers:Not Applicable 

## 2011-01-16 NOTE — Anesthesia Procedure Notes (Signed)
Procedure Name: Intubation Date/Time: 01/16/2011 1:40 PM Performed by: Sabra Heck Pre-anesthesia Checklist: Patient identified, Timeout performed, Emergency Drugs available, Suction available and Patient being monitored Patient Re-evaluated:Patient Re-evaluated prior to inductionOxygen Delivery Method: Circle System Utilized Preoxygenation: Pre-oxygenation with 100% oxygen Intubation Type: IV induction Ventilation: Mask ventilation without difficulty and Oral airway inserted - appropriate to patient size Laryngoscope Size: Miller and 2 Grade View: Grade I Tube type: Oral Tube size: 7.5 mm Number of attempts: 1 Airway Equipment and Method: stylet Placement Confirmation: ETT inserted through vocal cords under direct vision,  breath sounds checked- equal and bilateral and positive ETCO2 Secured at: 21 cm Tube secured with: Tape Dental Injury: Teeth and Oropharynx as per pre-operative assessment

## 2011-01-16 NOTE — Progress Notes (Signed)
CALL  To ASamul Dada, reviewed notes & cardio test results that have arrived from [employer], pt. Ready for OR

## 2011-01-16 NOTE — Progress Notes (Signed)
Discussed case briefly with Myra Gianotti, [facility], currently will try to get records from cardiac.

## 2011-01-16 NOTE — Progress Notes (Signed)
Subjective:  Is mildly somnolent but easily arousable. He looks well and has no complaints.  Objective: Vital signs in last 24 hours: Temp:  [97.8 F (36.6 C)-98 F (36.7 C)] 98 F (36.7 C) (12/12 1530) Pulse Rate:  [75] 75  (12/12 0922) Resp:  [18] 18  (12/12 0922) BP: (168)/(70) 168/70 mmHg (12/12 0922) SpO2:  [99 %] 99 % (12/12 0922)  Intake/Output from previous day:   Intake/Output this shift: Total I/O In: 1300 [I.V.:1300] Out: 100 [Blood:100]  Physical exam the patient is somnolent but easily arousable. Glasgow Coma Scale 13. He is moving all 4 extremities well. His dressing is clean and dry.  Lab Results: No results found for this basename: WBC:2,HGB:2,HCT:2,PLT:2 in the last 72 hours BMET No results found for this basename: NA:2,K:2,CL:2,CO2:2,GLUCOSE:2,BUN:2,CREATININE:2,CALCIUM:2 in the last 72 hours  Studies/Results: No results found.  Assessment/Plan: The patient is doing well.  LOS: 0 days     [name] D 01/16/2011, 3:37 PM

## 2011-01-16 NOTE — Progress Notes (Signed)
Call to [place], [place] , spoke with Lelan Pons, she reports that the pt. Has only been to their office to establish care for his pacemaker, a full H&P was not accomplished. Call to Dr. Derrel Nip, [place], [place], line busy.

## 2011-01-16 NOTE — Progress Notes (Signed)
Call to [place], Dr. Derrel Nip who sees pt. as PCP, was prev. /w [place]. [facility], to call their office for records. Call to [facility], in [place] spoke with Lelon Frohlich & she will fax last ov note & stress/echo.

## 2011-01-16 NOTE — Op Note (Signed)
Brief history: The patient is an 77 year old white male who has complained of back buttocks and leg pain consistent with neurogenic claudication. He has failed medical management worked up with a lumbar MRI and a lumbar myelo CT. These demonstrated patient's spinal stenosis at L4-5 and L5-S1. I discussed the various treatment options with the patient including surgery. The patient has weighed the risks, benefits, and alternatives to the surgery and decided proceed with the operation.  Preoperative diagnosis: L4-5 and L5-S1 spinal stenosis, neurogenic claudication, lumbar radiculopathy, lumbago.  Postoperative diagnosis: The same  Procedure: L4 and L5 laminectomy with L3 laminotomies to decompress the bilateral L4, L5 and S1 nerve roots using microdissection.  Surgeon: Dr. Earle Gell  Asst.: Dr. Erline Levine  Anesthesia: Gen. endotracheal  Estimated blood loss: 75 cc  Drains: None  Complications: None  Description of procedure: The patient was brought to the operating room by the anesthesia team. General endotracheal anesthesia was induced. The patient was turned to the prone position on the Wilson frame. The patient's lumbosacral region was then prepared with Betadine scrub and Betadine solution. Sterile drapes were applied.  I then injected the area to be incised with Marcaine with epinephrine solution. I then used a scalpel to make a linear midline incision over the L3-4, L4-5, L5-S1 intervertebral disc space. I then used electrocautery to perform a bilateral  subperiosteal dissection exposing the spinous process and lamina of L3, L4, L5 and the upper sacrum. We obtained intraoperative radiograph to confirm our location. I then inserted the [facility] retractor for exposure.  We then brought the operative microscope into the field. Under its magnification and illumination we completed the microdissection. I used a high-speed drill to perform a laminotomy at L3, L4 and L5 bilaterally. I  then used a Kerrison punches to widen the laminotomy at L3 and complete the laminectomy L4 and L5 and removed the ligamentum flavum at L3-4, L4-5 and L5-S1. We then used microdissection to free up the thecal sac and the bilateral L4, L5 and S1 nerve root from the epidural tissue. I then used a Kerrison punch to perform a foraminotomy at about the bilateral L4, L5 and S1 nerve root. We then using the nerve root retractor to gently retract the thecal sac and the nerve roots medially. This exposed the intervertebral disc. We inspected the bilateral L3-4, L4-5 and L5-S1 intervertebral disc bilaterally. There were no significant disc herniations   I then palpated along the ventral surface of the thecal sac and along exit route of the bilateral L4, L5 and S1 nerve root and noted that the neural structures were well decompressed. This completed the decompression.  We then obtained hemostasis using bipolar electrocautery. We irrigated the wound out with bacitracin solution. We then removed the retractor. We then reapproximated the patient's thoracolumbar fascia with interrupted #1 Vicryl suture. We then reapproximated the patient's subcutaneous tissue with interrupted 3-0 Vicryl suture. We then reapproximated patient's skin with Steri-Strips and benzoin. The was then coated with bacitracin ointment. The drapes were removed. The patient was subsequently returned to the supine position where they were extubated by the anesthesia team. The patient was then transported to the postanesthesia care unit in stable condition. All sponge instrument and needle counts were correct at the end of this case.

## 2011-01-16 NOTE — Progress Notes (Signed)
Pt. moved here from [place], [place], April 2012

## 2011-01-16 NOTE — Transfer of Care (Signed)
Immediate Anesthesia Transfer of Care Note  Patient: [name]  Procedure(s) Performed:  LUMBAR LAMINECTOMY/DECOMPRESSION MICRODISCECTOMY - Lumbar four and lumbar five laminectomies   Patient Location: PACU  Anesthesia Type: General  Level of Consciousness: awake, alert  and oriented  Airway & Oxygen Therapy: Patient Spontanous Breathing and Patient connected to nasal cannula oxygen  Post-op Assessment: Report given to PACU RN, Post -op Vital signs reviewed and stable and Patient moving all extremities X 4  Post vital signs: Reviewed and stable  Complications: No apparent anesthesia complications

## 2011-01-16 NOTE — H&P (Signed)
Subjective: The patient is an 77 year old white male complains of chronic back and leg pain. He has failed medical management and was further worked up with a lumbar myelo CT. This demonstrated the patient had spinal stenosis at L4-5 and L5-S1. I discussed the various treatment options with him including surgery. The patient has weighed the risks, benefits, and alternatives surgery decided proceed with the operation.   Past Medical History  Diagnosis Date  . H/O: malaria 40    in [place]  . Hyperlipidemia   . Hypertension   . Diabetes mellitus   . Renal insufficiency     by Nov 2012 labs, no old records available  . Peripheral artery disease     folowed by Dew post bypass 2011 [place]  . Blood transfusion   . Coronary artery disease     h/o bypass coronary- 2001 & peripheral- 2011 , last full cardiac visit  in [place] , [place], [employer]    Past Surgical History  Procedure Date  . Spine surgery   . Coronary artery bypass graft 2001    4 vessel, done [place]   . Pacemaker insertion   . Femoral bypass 2011    done in [place],  now followed by [facility]  . Insert / replace / remove pacemaker     2004    No Known Allergies  History  Substance Use Topics  . Smoking status: Former Smoker    Quit date: 09/27/1985  . Smokeless tobacco: Never Used  . Alcohol Use: No    Family History  Problem Relation Age of Onset  . Diabetes Mother   . Stroke Father   . Anesthesia problems Neg Hx   . Hypotension Neg Hx   . Malignant hyperthermia Neg Hx   . Pseudochol deficiency Neg Hx    Prior to Admission medications   Medication Sig Start Date End Date Taking? Authorizing Provider  amLODipine (NORVASC) 10 MG tablet Take 10 mg by mouth daily.      Historical Provider, MD  aspirin 325 MG tablet Take 325 mg by mouth daily.      Historical Provider, MD  atorvastatin (LIPITOR) 40 MG tablet Take 1 tablet (40 mg total) by mouth daily. 01/01/11   Deborra Medina, MD  Calcium  Carbonate-Vitamin D (CALCIUM + D) 600-200 MG-UNIT TABS Take 1 tablet by mouth 2 (two) times daily.      Historical Provider, MD  carbamazepine (TEGRETOL) 200 MG tablet Take 200 mg by mouth daily.      Historical Provider, MD  clopidogrel (PLAVIX) 75 MG tablet Take 75 mg by mouth daily.      Historical Provider, MD  cyclobenzaprine (FLEXERIL) 10 MG tablet Take 1 tablet (10 mg total) by mouth daily. 10/30/10   Deborra Medina, MD  fenofibrate (TRICOR) 48 MG tablet Take 48 mg by mouth daily.      Historical Provider, MD  insulin aspart protamine-insulin aspart (NOVOLOG MIX 70/30 FLEXPEN) (70-30) 100 UNIT/ML injection 20 units before breakfast, 26 units before dinner 01/15/11   Deborra Medina, MD  metoprolol (LOPRESSOR) 50 MG tablet Take 50 mg by mouth 2 (two) times daily.      Historical Provider, MD  oxyCODONE-acetaminophen (PERCOCET) 7.5-325 MG per tablet Take 1 tablet by mouth every 4 (four) hours as needed. For pain    Historical Provider, MD  valsartan (DIOVAN) 160 MG tablet Take 160 mg by mouth daily.      Historical Provider, MD     Review of Systems  Positive ROS: Negative except as above  All other systems have been reviewed and were otherwise negative with the exception of those mentioned in the HPI and as above.  Objective: Vital signs in last 24 hours: Temp:  [97.8 F (36.6 C)] 97.8 F (36.6 C) (12/12 0922) Pulse Rate:  [75] 75  (12/12 0922) Resp:  [18] 18  (12/12 0922) BP: (168)/(70) 168/70 mmHg (12/12 0922) SpO2:  [99 %] 99 % (12/12 0922)  General Appearance: Alert, cooperative, no distress, appears stated age Head: Normocephalic, without obvious abnormality, atraumatic Eyes: PERRL, conjunctiva/corneas clear, EOM's intact, fundi benign, both eyes      Ears: Normal TM's and external ear canals, both ears Throat: Lips, mucosa, and tongue normal; teeth and gums normal Neck: Supple, symmetrical, trachea midline, no adenopathy; thyroid: No enlargement/tenderness/nodules; no carotid  bruit or JVD Back: Symmetric, no curvature, ROM normal, no CVA tenderness Lungs: Clear to auscultation bilaterally, respirations unlabored Heart: Regular rate and rhythm, S1 and S2 normal, no murmur, rub or gallop Abdomen: Soft, non-tender, bowel sounds active all four quadrants, no masses, no organomegaly Extremities: Extremities normal, atraumatic, no cyanosis or edema Pulses: 2+ and symmetric all extremities Skin: Skin color, texture, turgor normal, no rashes or lesions  NEUROLOGIC:   Mental status: alert and oriented, no aphasia, good attention span, Fund of knowledge/ memory ok Motor Exam - grossly normal Sensory Exam - grossly normal Reflexes:  Coordination - grossly normal Gait - grossly normal Balance - grossly normal Cranial Nerves: I: smell Not tested  II: visual acuity  OS: Normal    OD: Normal   II: visual fields Full to confrontation  II: pupils Equal, round, reactive to light  III,VII: ptosis None  III,IV,VI: extraocular muscles  Full ROM  V: mastication Normal  V: facial light touch sensation  Normal  V,VII: corneal reflex  Present  VII: facial muscle function - upper  Normal  VII: facial muscle function - lower Normal  VIII: hearing Not tested  IX: soft palate elevation  Normal  IX,X: gag reflex Present  XI: trapezius strength  5/5  XI: sternocleidomastoid strength 5/5  XI: neck flexion strength  5/5  XII: tongue strength  Normal    Data Review Lab Results  Component Value Date   WBC 6.4 01/09/2011   HGB 13.0 01/09/2011   HCT 40.0 01/09/2011   MCV 97.6 01/09/2011   PLT 215 01/09/2011   Lab Results  Component Value Date   NA 143 01/09/2011   K 4.5 01/09/2011   CL 109 01/09/2011   CO2 24 01/09/2011   BUN 31* 01/09/2011   CREATININE 1.71* 01/09/2011   GLUCOSE 132* 01/09/2011   No results found for this basename: INR, PROTIME    Imaging studies: I reviewed the patient's lumbar MRI performed at [facility] on 09/11/2010. It demonstrates  the patient had spinal stenosis most common and L4-5.  I've also reviewed the patient's lumbar myelogram/CT scan formed at [facility] 12/18/2010. Demonstrate severe multifactorial spinal stenosis at L4-5 with more moderate stenosis at L5-S1.  Assessment/Plan: L4-5 and L5-S1 spinal stenosis, lumbago, lumbar radiculopathy, neurogenic claudication: I discussed situation with patient as well as with his wife and son. I reviewed the imaging studies with them and pointed out the abnormalities. We have discussed the various treatment options including surgery. I described the surgical option of an L4 and L5 laminectomy to treat his spinal stenosis at L4-5 and L5-S1. I discussed the risks, benefits, alternatives and the likelihood of achieving  our goals. I have answered all the patient and his family's questions. He wants to proceed with surgery.   Nazareth Norenberg D 01/16/2011 1:00 PM

## 2011-01-16 NOTE — Anesthesia Postprocedure Evaluation (Signed)
  Anesthesia Post-op Note  Patient: [name]  Procedure(s) Performed:  LUMBAR LAMINECTOMY/DECOMPRESSION MICRODISCECTOMY - Lumbar four and lumbar five laminectomies   Patient Location: PACU  Anesthesia Type: General  Level of Consciousness: awake, oriented, sedated and patient cooperative  Airway and Oxygen Therapy: Patient Spontanous Breathing and Patient connected to nasal cannula oxygen  Post-op Pain: mild  Post-op Assessment: Post-op Vital signs reviewed, Patient's Cardiovascular Status Stable, Respiratory Function Stable, Patent Airway, No signs of Nausea or vomiting and Pain level controlled  Post-op Vital Signs: stable  Complications: No apparent anesthesia complications

## 2011-01-17 ENCOUNTER — Encounter (HOSPITAL_COMMUNITY): Payer: Self-pay | Admitting: Neurosurgery

## 2011-01-17 MED ORDER — OXYCODONE-ACETAMINOPHEN 5-325 MG PO TABS: 1.0000 | ORAL_TABLET | ORAL | Status: AC | PRN

## 2011-01-17 MED ORDER — DSS 100 MG PO CAPS: 100.0000 mg | ORAL_CAPSULE | Freq: Two times a day (BID) | ORAL | Status: AC

## 2011-01-17 NOTE — Progress Notes (Signed)
Order received, chart reviewed, Mr. Wilfert is up and walking the hallways and is already dressed in his street clothes.  He is aware of his back precautions and says his wife can A him with LB dressing prn. No needs identified, thus eval not completed. Acute OT signing off.  Golden Circle, OTR/L [id number] 01/17/2011

## 2011-01-17 NOTE — Progress Notes (Signed)
Physical Therapy Evaluation Patient Details Name: [name] MRN: [id number] DOB: [birth year] Today's Date: 01/17/2011  Problem List:  Patient Active Problem List  Diagnoses  . Diabetes mellitus  . Hypertension  . Hyperlipidemia LDL goal < 70  . Coronary artery occlusion without or not resulting in myocardial infarction  . Spinal stenosis of lumbar region  . Screening for colon cancer  . Neuropathy due to secondary diabetes  . Peripheral vascular disease due to secondary diabetes  . Peripheral artery disease  . Kidney disease, chronic, stage III (moderate, EGFR 30-59 ml/min)    Past Medical History:  Past Medical History  Diagnosis Date  . H/O: malaria 7    in [place]  . Hyperlipidemia   . Hypertension   . Diabetes mellitus   . Renal insufficiency     by Nov 2012 labs, no old records available  . Peripheral artery disease     folowed by Dew post bypass 2011 [place]  . Blood transfusion   . Coronary artery disease     h/o bypass coronary- 2001 & peripheral- 2011 , last full cardiac visit  in [place] , [place], [employer]   Past Surgical History:  Past Surgical History  Procedure Date  . Spine surgery   . Coronary artery bypass graft 2001    4 vessel, done [place]   . Pacemaker insertion   . Femoral bypass 2011    done in [place],  now followed by [facility]  . Insert / replace / remove pacemaker     2004    PT Assessment/Plan/Recommendation PT Assessment Clinical Impression Statement: Patient presents status post L4/5 laminectomies. He has been educated in correct posture, back precautions, and body mechanics for optimal healing. No further skilled PT needs. PT Recommendation/Assessment: Patent does not need any further PT services No Skilled PT: All education completed;Patient is supervision for all activity/mobility PT Recommendation Follow Up Recommendations: None Equipment Recommended: None recommended by PT  PT  Evaluation Precautions/Restrictions  Precautions Precautions: Back Precaution Booklet Issued: Yes (comment) Precaution Comments: Educated in posture, body mechanics, and back precautions as per post-op handout. Also discussed potential future need for cane secondary to peripheral neuropathy leading to increased fall risk Prior Functioning  Home Living Lives With: Spouse Receives Help From: Family Type of Home: Apartment Home Layout: One level Home Access: Level entry Bathroom Shower/Tub: Walk-in Sales promotion account executive: Standard Home Adaptive Equipment: None Additional Comments: Wife has equipment and an elevated toilet in her bathroom Prior Function Level of Independence: Independent with basic ADLs;Independent with homemaking with ambulation Vocation: Retired Public librarian Arousal/Alertness: Awake/alert Overall Cognitive Status: Appears within functional limits for tasks assessed Orientation Level: Oriented X4 Sensation/Coordination Sensation Light Touch: Impaired Detail Additional Comments: Peripheral neuropathy Extremity Assessment RLE Assessment RLE Assessment: Within Functional Limits LLE Assessment LLE Assessment: Within Functional Limits Mobility (including Balance) Bed Mobility Bed Mobility: Yes Rolling Right: 6: Modified independent (Device/Increase time) Right Sidelying to Sit: 6: Modified independent (Device/Increase time) Sitting - Scoot to Edge of Bed: 6: Modified independent (Device/Increase time) Sit to Supine - Right: 6: Modified independent (Device/Increase time) Transfers Transfers: Yes Sit to Stand: 6: Modified independent (Device/Increase time) Stand to Sit: 6: Modified independent (Device/Increase time) Ambulation/Gait Ambulation/Gait: Yes Ambulation/Gait Assistance: 5: Supervision Ambulation/Gait Assistance Details (indicate cue type and reason): Initial guarding secondary to increased postural sway, but imporved with increased  distance ? related to peripheral neuropathy.  Ambulation Distance (Feet): 150 Feet Assistive device: None Gait Pattern: Decreased stride length;Decreased dorsiflexion -  right;Decreased dorsiflexion - left    End of Session PT - End of Session Activity Tolerance: Patient tolerated treatment well Patient left: in bed;with family/visitor present Nurse Communication: Mobility status for ambulation General Behavior During Session: [facility] for tasks performed Cognition: [facility] for tasks performed  Jake Bathe, PT  Pager [phone]  01/17/2011, 8:33 AM

## 2011-01-17 NOTE — Discharge Summary (Signed)
Physician Discharge Summary  Patient ID: [name] MRN: [id number] DOB/AGE: Jul 15, 1924 77 y.o.  Admit date: 01/16/2011 Discharge date: 01/17/2011  Admission Diagnoses:  Discharge Diagnoses:  Active Problems:  * No active hospital problems. *    Discharged Condition: good  Hospital Course: I admitted the patient to [facility] on 01/16/2011. On that day I performed a L4 and L5 laminectomy. The surgery went well. Patient's postoperative course was unremarkable he was discharged home postop day #1.  Consults: None Significant Diagnostic Studies: None Treatments: L4 and L5 laminectomy Discharge Exam: Blood pressure 117/43, pulse 70, temperature 98.8 F (37.1 C), temperature source Oral, resp. rate 18, SpO2 97.00%. The patient is alert and oriented. His strength is normal.  Disposition: Home  Discharge Orders    Future Appointments: Provider: Department: Dept Phone: Center:   04/02/2011 2:00 PM Deborra Medina, MD [facility] [phone] None     Future Orders Please Complete By Expires   Diet - low sodium heart healthy      Increase activity slowly      Discharge instructions      Comments:   Call [phone] for a follow up appointment.   Remove dressing in 48 hours      Call MD for:  temperature >100.4      Call MD for:  persistant nausea and vomiting      Call MD for:  severe uncontrolled pain      Call MD for:  redness, tenderness, or signs of infection (pain, swelling, redness, odor or green/yellow discharge around incision site)      Call MD for:  difficulty breathing, headache or visual disturbances      Call MD for:  hives      Call MD for:  persistant dizziness or light-headedness      Call MD for:  extreme fatigue        Current Discharge Medication List    START taking these medications   Details  docusate sodium 100 MG CAPS Take 100 mg by mouth 2 (two) times daily. Qty: 60 capsule, Refills: 1    oxyCODONE-acetaminophen (PERCOCET)  5-325 MG per tablet Take 1-2 tablets by mouth every 4 (four) hours as needed. Qty: 100 tablet, Refills: 0      CONTINUE these medications which have NOT CHANGED   Details  amLODipine (NORVASC) 10 MG tablet Take 10 mg by mouth daily.      aspirin 325 MG tablet Take 325 mg by mouth daily.      atorvastatin (LIPITOR) 40 MG tablet Take 1 tablet (40 mg total) by mouth daily. Qty: 90 tablet, Refills: 2   Associated Diagnoses: Other and unspecified hyperlipidemia    Calcium Carbonate-Vitamin D (CALCIUM + D) 600-200 MG-UNIT TABS Take 1 tablet by mouth 2 (two) times daily.      carbamazepine (TEGRETOL) 200 MG tablet Take 200 mg by mouth daily.      clopidogrel (PLAVIX) 75 MG tablet Take 75 mg by mouth daily.      cyclobenzaprine (FLEXERIL) 10 MG tablet Take 1 tablet (10 mg total) by mouth daily. Qty: 90 tablet, Refills: 0    fenofibrate (TRICOR) 48 MG tablet Take 48 mg by mouth daily.      insulin aspart protamine-insulin aspart (NOVOLOG MIX 70/30 FLEXPEN) (70-30) 100 UNIT/ML injection 20 units before breakfast, 26 units before dinner Qty: 10 mL, Refills: 12    metoprolol (LOPRESSOR) 50 MG tablet Take 50 mg by mouth 2 (two) times daily.  valsartan (DIOVAN) 160 MG tablet Take 160 mg by mouth daily.        STOP taking these medications     oxyCODONE-acetaminophen (PERCOCET) 7.5-325 MG per tablet          Signed: Dyshaun Bonzo D 01/17/2011, 7:48 AM

## 2011-01-17 NOTE — Progress Notes (Signed)
Patient ID: [name], male   DOB: [birth year], 77 y.o.   MRN: [id number] Subjective:  The patient is alert and pleasant. He looks well. He wants to go home.  Objective: Vital signs in last 24 hours: Temp:  [97 F (36.1 C)-98.8 F (37.1 C)] 98.8 F (37.1 C) (12/13 0400) Pulse Rate:  [60-75] 70  (12/13 0400) Resp:  [11-19] 18  (12/13 0400) BP: (117-168)/(43-73) 117/43 mmHg (12/13 0400) SpO2:  [97 %-100 %] 97 % (12/13 0400)  Intake/Output from previous day: 12/12 0701 - 12/13 0700 In: 1760 [P.O.:360; I.V.:1300; IV Piggyback:100] Out: 100 [Blood:100] Intake/Output this shift:    Physical exam the patient is alert and oriented. Strength is normal his bilateral lower extremities.  Lab Results: No results found for this basename: WBC:2,HGB:2,HCT:2,PLT:2 in the last 72 hours BMET No results found for this basename: NA:2,K:2,CL:2,CO2:2,GLUCOSE:2,BUN:2,CREATININE:2,CALCIUM:2 in the last 72 hours  Studies/Results: Dg Lumbar Spine 1 View  01/16/2011  *RADIOLOGY REPORT*  Clinical Data: 05/09/2008.  LUMBAR SPINE - 1 VIEW  Comparison: 12/18/2010 post myelogram CT.  Findings: Single intraoperative lateral view of the lumbar spine submitted for review at surgery.  Metallic probe directed towards the L5-S1 level.  Metallic rakes at the [id number] level.  Surgical sponge space between the upper L4 and the lower L5 vertebra level.  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  Aortic stent is in place.  IMPRESSION: Localization from the upper L4 to the lower L5 level as detailed above.  Original Report Authenticated By: Doug Sou, M.D.    Assessment/Plan: Postop day #1: The patient is doing well. I will discharge him to home. I have given him oral and written discharge instructions. I answered all the patient and his son's questions.  LOS: 1 day     [name] D 01/17/2011, 7:47 AM

## 2011-01-23 ENCOUNTER — Telehealth: Payer: Self-pay | Admitting: Internal Medicine

## 2011-01-23 MED ORDER — GLUCOSE BLOOD VI STRP: ORAL_STRIP | Status: DC

## 2011-01-23 NOTE — Telephone Encounter (Signed)
Ok to refill 

## 2011-01-23 NOTE — Telephone Encounter (Signed)
Rx sent to pharmacy, line at home number was busy.

## 2011-01-23 NOTE — Telephone Encounter (Signed)
Yes you can refill  #120/month or 360/90 day . thanks

## 2011-01-23 NOTE — Telephone Encounter (Signed)
[id number] Pt called his rx for accu check aviva plus test strips  He test 4 times daily  90 day supply [facility] mail order

## 2011-02-07 ENCOUNTER — Telehealth: Payer: Self-pay | Admitting: Internal Medicine

## 2011-02-07 MED ORDER — GLUCOSE BLOOD VI STRP: ORAL_STRIP | Status: DC

## 2011-02-07 NOTE — Telephone Encounter (Signed)
Pt requests 3 month supply on test strips, he states he checks his blood sugar 4 times a day.

## 2011-03-05 DIAGNOSIS — I70219 Atherosclerosis of native arteries of extremities with intermittent claudication, unspecified extremity: Secondary | ICD-10-CM | POA: Diagnosis not present

## 2011-03-05 DIAGNOSIS — I739 Peripheral vascular disease, unspecified: Secondary | ICD-10-CM | POA: Diagnosis not present

## 2011-03-05 DIAGNOSIS — I10 Essential (primary) hypertension: Secondary | ICD-10-CM | POA: Diagnosis not present

## 2011-03-05 DIAGNOSIS — M79609 Pain in unspecified limb: Secondary | ICD-10-CM | POA: Diagnosis not present

## 2011-03-11 ENCOUNTER — Telehealth: Payer: Self-pay | Admitting: Internal Medicine

## 2011-03-11 MED ORDER — CLOPIDOGREL BISULFATE 75 MG PO TABS: 75.0000 mg | ORAL_TABLET | Freq: Every day | ORAL | Status: DC

## 2011-03-11 NOTE — Telephone Encounter (Signed)
[id number] Pt walked in needed rx for clopidogrel tab 75mg  [facility] mail order

## 2011-03-26 DIAGNOSIS — I495 Sick sinus syndrome: Secondary | ICD-10-CM | POA: Diagnosis not present

## 2011-03-26 DIAGNOSIS — I10 Essential (primary) hypertension: Secondary | ICD-10-CM | POA: Diagnosis not present

## 2011-03-26 DIAGNOSIS — I2510 Atherosclerotic heart disease of native coronary artery without angina pectoris: Secondary | ICD-10-CM | POA: Diagnosis not present

## 2011-03-28 ENCOUNTER — Other Ambulatory Visit (INDEPENDENT_AMBULATORY_CARE_PROVIDER_SITE_OTHER): Payer: Medicare Other | Admitting: *Deleted

## 2011-03-28 DIAGNOSIS — E119 Type 2 diabetes mellitus without complications: Secondary | ICD-10-CM | POA: Diagnosis not present

## 2011-03-28 DIAGNOSIS — Z79899 Other long term (current) drug therapy: Secondary | ICD-10-CM | POA: Diagnosis not present

## 2011-03-28 LAB — COMPREHENSIVE METABOLIC PANEL
Albumin: 4 g/dL (ref 3.5–5.2)
Alkaline Phosphatase: 70 U/L (ref 39–117)
BUN: 25 mg/dL — ABNORMAL HIGH (ref 6–23)
CO2: 27 mEq/L (ref 19–32)
GFR: 37.38 mL/min — ABNORMAL LOW (ref >60.00)
Glucose, Bld: 72 mg/dL (ref 70–99)
Sodium: 145 mEq/L (ref 135–145)
Total Bilirubin: 0.3 mg/dL (ref 0.3–1.2)
Total Protein: 6.8 g/dL (ref 6.0–8.3)

## 2011-03-28 NOTE — Progress Notes (Signed)
Addended by: Alois Cliche on: 03/28/2011 01:57 PM   Modules accepted: Orders

## 2011-04-02 ENCOUNTER — Ambulatory Visit (INDEPENDENT_AMBULATORY_CARE_PROVIDER_SITE_OTHER): Payer: Medicare Other | Admitting: Internal Medicine

## 2011-04-02 ENCOUNTER — Encounter: Payer: Self-pay | Admitting: Internal Medicine

## 2011-04-02 DIAGNOSIS — E1159 Type 2 diabetes mellitus with other circulatory complications: Secondary | ICD-10-CM

## 2011-04-02 DIAGNOSIS — I739 Peripheral vascular disease, unspecified: Secondary | ICD-10-CM

## 2011-04-02 DIAGNOSIS — I798 Other disorders of arteries, arterioles and capillaries in diseases classified elsewhere: Secondary | ICD-10-CM | POA: Diagnosis not present

## 2011-04-02 DIAGNOSIS — I10 Essential (primary) hypertension: Secondary | ICD-10-CM

## 2011-04-02 DIAGNOSIS — M48061 Spinal stenosis, lumbar region without neurogenic claudication: Secondary | ICD-10-CM

## 2011-04-02 DIAGNOSIS — E1151 Type 2 diabetes mellitus with diabetic peripheral angiopathy without gangrene: Secondary | ICD-10-CM

## 2011-04-02 NOTE — Assessment & Plan Note (Signed)
His glycemic control has improved and as I anticipated and predicted at his last visit his use of insulin 3 times daily is causing lows due to overlapping peaks. He insists that her trials of twice daily were not effective. Therefore he reduced his insulin today to the following schedule 18 units in the morning 14 units at lunch 16 units at dinner this represents a reducing reduction of 6 units compared to prior total insulin use.

## 2011-04-02 NOTE — Assessment & Plan Note (Signed)
Well-controlled on current regimen. No changes today. 

## 2011-04-02 NOTE — Assessment & Plan Note (Signed)
Prior persistent unrelenting back pain and radiculopathy. He is now pain-free after successful lumbar laminectomy and microdiscectomy in December by Dr. Arnoldo Morale. We are stopping his Flexeril and Tegretol.

## 2011-04-02 NOTE — Progress Notes (Signed)
Subjective:    Patient ID: [name], male    DOB: Oct 23, 1924, 78 y.o.   MRN: [id number]  HPI [name] is an 78 year old white male with a history of diabetes mellitus, insulin requiring hypertension, hyperlipidemia, sick sinus syndrome status post pacemaker, and peripheral vascular disease who presents for regular followup of chronic medical conditions. He underwent a lumbar laminectomy and microdiscectomy in December by Dr. Earle Gell and for the first time in 5 or 6 years has been pain-free. He is quite pleased with the results and is eager to get back to be physically active. He brings a log of his blood sugars in with him. He has been taking 18 units of 7030 insulin 3 times daily as are prior trial of twice daily 7030 able to achieve good results with glycemic control. His blood sugars indicate that he is having frequent sugars in the 70s in the morning and for his evening meal. He has had no blood sugars over 120. His last hemoglobin A1c was markedly improved at 6.7 compared to a 8.0 months ago. His blood pressures have been well controlled. The pain in his lower legs has resolved and he is requesting discontinuation of his Flexeril and carbamazepine. He continues to have recurrent insomnia which he manages with over-the-counter Tylenol PM.  Past Medical History  Diagnosis Date  . H/O: malaria 42    in [place]  . Hyperlipidemia   . Hypertension   . Diabetes mellitus   . Renal insufficiency     by Nov 2012 labs, no old records available  . Peripheral artery disease     folowed by Dew post bypass 2011 [place]  . Blood transfusion   . Coronary artery disease     h/o bypass coronary- 2001 & peripheral- 2011 , last full cardiac visit  in [place] , [place], [employer] [facility]   Current Outpatient Prescriptions on File Prior to Visit  Medication Sig Dispense Refill  . amLODipine (NORVASC) 10 MG tablet Take 10 mg by mouth daily.        Marland Kitchen aspirin 325 MG tablet Take 325 mg by mouth daily.         Marland Kitchen atorvastatin (LIPITOR) 40 MG tablet Take 1 tablet (40 mg total) by mouth daily.  90 tablet  2  . Calcium Carbonate-Vitamin D (CALCIUM + D) 600-200 MG-UNIT TABS Take 1 tablet by mouth 2 (two) times daily.        . carbamazepine (TEGRETOL) 200 MG tablet Take 200 mg by mouth daily.        . clopidogrel (PLAVIX) 75 MG tablet Take 1 tablet (75 mg total) by mouth daily.  90 tablet  3  . cyclobenzaprine (FLEXERIL) 10 MG tablet Take 1 tablet (10 mg total) by mouth daily.  90 tablet  0  . fenofibrate (TRICOR) 48 MG tablet Take 48 mg by mouth daily.        Marland Kitchen glucose blood (ACCU-CHEK AVIVA PLUS) test strip Check blood sugar 4 times a day  400 each  3  . metoprolol (LOPRESSOR) 50 MG tablet Take 50 mg by mouth 2 (two) times daily.        . valsartan (DIOVAN) 160 MG tablet Take 160 mg by mouth daily.          Review of Systems  Constitutional: Negative for fever, chills, diaphoresis, activity change, appetite change, fatigue and unexpected weight change.  HENT: Positive for hearing loss. Negative for ear pain, nosebleeds, congestion, sore throat, facial swelling, rhinorrhea, sneezing,  drooling, mouth sores, trouble swallowing, neck pain, neck stiffness, dental problem, voice change, postnasal drip, sinus pressure, tinnitus and ear discharge.   Eyes: Negative for photophobia, pain, discharge, redness, itching and visual disturbance.  Respiratory: Negative for apnea, cough, choking, chest tightness, shortness of breath, wheezing and stridor.   Cardiovascular: Negative for chest pain, palpitations and leg swelling.  Gastrointestinal: Negative for nausea, vomiting, abdominal pain, diarrhea, constipation, blood in stool, abdominal distention, anal bleeding and rectal pain.  Genitourinary: Negative for dysuria, urgency, frequency, hematuria, flank pain, decreased urine volume, scrotal swelling, difficulty urinating and testicular pain.  Musculoskeletal: Negative for myalgias, back pain, joint swelling,  arthralgias and gait problem.  Skin: Negative for color change, rash and wound.  Neurological: Negative for dizziness, tremors, seizures, syncope, speech difficulty, weakness, light-headedness, numbness and headaches.  Psychiatric/Behavioral: Positive for sleep disturbance. Negative for suicidal ideas, hallucinations, behavioral problems, confusion, dysphoric mood, decreased concentration and agitation. The patient is not nervous/anxious.        Objective:   Physical Exam  Constitutional: He is oriented to person, place, and time.  HENT:  Head: Normocephalic and atraumatic.  Mouth/Throat: Oropharynx is clear and moist.  Eyes: Conjunctivae and EOM are normal.  Neck: Normal range of motion. Neck supple. No JVD present. No thyromegaly present.  Cardiovascular: Normal rate, regular rhythm and normal heart sounds.   Pulmonary/Chest: Effort normal and breath sounds normal. He has no wheezes. He has no rales.  Abdominal: Soft. Bowel sounds are normal. He exhibits no mass. There is no tenderness. There is no rebound.  Musculoskeletal: Normal range of motion. He exhibits no edema.  Neurological: He is alert and oriented to person, place, and time.  Skin: Skin is warm and dry.  Psychiatric: He has a normal mood and affect.       Assessment & Plan:   Diabetes mellitus type 2 with peripheral artery disease His glycemic control has improved and as I anticipated and predicted at his last visit his use of insulin 3 times daily is causing lows due to overlapping peaks. He insists that her trials of twice daily were not effective. Therefore he reduced his insulin today to the following schedule 18 units in the morning 14 units at lunch 16 units at dinner this represents a reducing reduction of 6 units compared to prior total insulin use.  Hypertension Well-controlled on current regimen. No changes today.  Kidney disease, chronic, stage III (moderate, EGFR 30-59 ml/min) His GFR is stable. He has had  no nephrology followup in years. We'll recommend followup with local nephrologist in the in the coming months. Continue valsartan, strict control of hypertension and diabetes.  Spinal stenosis of lumbar region Prior persistent unrelenting back pain and radiculopathy. He is now pain-free after successful lumbar laminectomy and microdiscectomy in December by Dr. Arnoldo Morale. We are stopping his Flexeril and Tegretol.    Updated Medication List Outpatient Encounter Prescriptions as of 04/02/2011  Medication Sig Dispense Refill  . amLODipine (NORVASC) 10 MG tablet Take 10 mg by mouth daily.        Marland Kitchen aspirin 325 MG tablet Take 325 mg by mouth daily.        Marland Kitchen atorvastatin (LIPITOR) 40 MG tablet Take 1 tablet (40 mg total) by mouth daily.  90 tablet  2  . Calcium Carbonate-Vitamin D (CALCIUM + D) 600-200 MG-UNIT TABS Take 1 tablet by mouth 2 (two) times daily.        . carbamazepine (TEGRETOL) 200 MG tablet Take 200 mg by mouth  daily.        . clopidogrel (PLAVIX) 75 MG tablet Take 1 tablet (75 mg total) by mouth daily.  90 tablet  3  . cyclobenzaprine (FLEXERIL) 10 MG tablet Take 1 tablet (10 mg total) by mouth daily.  90 tablet  0  . fenofibrate (TRICOR) 48 MG tablet Take 48 mg by mouth daily.        Marland Kitchen glucose blood (ACCU-CHEK AVIVA PLUS) test strip Check blood sugar 4 times a day  400 each  3  . insulin aspart protamine-insulin aspart (NOVOLOG 70/30) (70-30) 100 UNIT/ML injection 18 units three times a day      . metoprolol (LOPRESSOR) 50 MG tablet Take 50 mg by mouth 2 (two) times daily.        . valsartan (DIOVAN) 160 MG tablet Take 160 mg by mouth daily.        Marland Kitchen DISCONTD: insulin aspart protamine-insulin aspart (NOVOLOG MIX 70/30 FLEXPEN) (70-30) 100 UNIT/ML injection 20 units before breakfast, 26 units before dinner  10 mL  12

## 2011-04-02 NOTE — Assessment & Plan Note (Signed)
His GFR is stable. He has had no nephrology followup in years. We'll recommend followup with local nephrologist in the in the coming months. Continue valsartan, strict control of hypertension and diabetes.

## 2011-04-03 ENCOUNTER — Telehealth: Payer: Self-pay | Admitting: Internal Medicine

## 2011-04-03 MED ORDER — VALSARTAN 160 MG PO TABS: 160.0000 mg | ORAL_TABLET | Freq: Every day | ORAL | Status: DC

## 2011-04-03 MED ORDER — FENOFIBRATE 48 MG PO TABS: 48.0000 mg | ORAL_TABLET | Freq: Every day | ORAL | Status: DC

## 2011-04-03 NOTE — Telephone Encounter (Signed)
Both Rxs have been faxed in.

## 2011-04-03 NOTE — Telephone Encounter (Signed)
[id number] Pt came in wanted to get refill rx for  diovan tab 160 mg 1 tablet once daily 90 day supply Fenofibrate tab 48mg  take 1 tablet once daily 90 day supply [facility]

## 2011-04-12 DIAGNOSIS — H4010X0 Unspecified open-angle glaucoma, stage unspecified: Secondary | ICD-10-CM | POA: Diagnosis not present

## 2011-04-23 DIAGNOSIS — I495 Sick sinus syndrome: Secondary | ICD-10-CM | POA: Diagnosis not present

## 2011-05-15 ENCOUNTER — Telehealth: Payer: Self-pay | Admitting: Internal Medicine

## 2011-05-15 MED ORDER — AMLODIPINE BESYLATE 10 MG PO TABS: 10.0000 mg | ORAL_TABLET | Freq: Every day | ORAL | Status: DC

## 2011-05-15 MED ORDER — METOPROLOL TARTRATE 50 MG PO TABS: 50.0000 mg | ORAL_TABLET | Freq: Two times a day (BID) | ORAL | Status: DC

## 2011-05-15 NOTE — Telephone Encounter (Signed)
[id number] Pt came in to get refill on  Metoprolol tab tar 50mg  take 1 tablet twice a day Amlodipine besylate tablet 10mg   1 daily  [facility] mail order

## 2011-06-10 ENCOUNTER — Telehealth: Payer: Self-pay | Admitting: Internal Medicine

## 2011-06-10 DIAGNOSIS — E119 Type 2 diabetes mellitus without complications: Secondary | ICD-10-CM

## 2011-06-10 NOTE — Telephone Encounter (Signed)
Patient wants to know if he needs blood work prior to his appointment on 5.29.13.

## 2011-06-11 NOTE — Telephone Encounter (Signed)
Yes:  Fasting lipids, HgbA1c urine microalb.cr ratio,  And CMET .  I will place orders

## 2011-06-12 NOTE — Telephone Encounter (Signed)
Patient notified, he made an appt.

## 2011-06-24 ENCOUNTER — Other Ambulatory Visit: Payer: Medicare Other

## 2011-06-26 ENCOUNTER — Other Ambulatory Visit (INDEPENDENT_AMBULATORY_CARE_PROVIDER_SITE_OTHER): Payer: Medicare Other | Admitting: *Deleted

## 2011-06-26 DIAGNOSIS — E119 Type 2 diabetes mellitus without complications: Secondary | ICD-10-CM | POA: Diagnosis not present

## 2011-06-26 DIAGNOSIS — IMO0001 Reserved for inherently not codable concepts without codable children: Secondary | ICD-10-CM

## 2011-06-26 LAB — LIPID PANEL
Cholesterol: 85 mg/dL (ref 0–200)
LDL Cholesterol: 33 mg/dL (ref 0–99)
Triglycerides: 95 mg/dL (ref 0.0–149.0)
VLDL: 19 mg/dL (ref 0.0–40.0)

## 2011-06-26 LAB — HEMOGLOBIN A1C: Hgb A1c MFr Bld: 6.6 % — ABNORMAL HIGH (ref 4.6–6.5)

## 2011-06-26 LAB — MICROALBUMIN / CREATININE URINE RATIO: Microalb, Ur: 5.1 mg/dL — ABNORMAL HIGH (ref 0.0–1.9)

## 2011-06-27 LAB — COMPLETE METABOLIC PANEL WITH GFR
Albumin: 3.9 g/dL (ref 3.5–5.2)
BUN: 27 mg/dL — ABNORMAL HIGH (ref 6–23)
Calcium: 9.1 mg/dL (ref 8.4–10.5)
Chloride: 111 mEq/L (ref 96–112)
Creat: 1.7 mg/dL — ABNORMAL HIGH (ref 0.50–1.35)
GFR, Est Non African American: 36 mL/min — ABNORMAL LOW
Glucose, Bld: 99 mg/dL (ref 70–99)
Potassium: 5 mEq/L (ref 3.5–5.3)

## 2011-07-01 ENCOUNTER — Other Ambulatory Visit: Payer: Medicare Other

## 2011-07-03 ENCOUNTER — Encounter: Payer: Self-pay | Admitting: Internal Medicine

## 2011-07-03 ENCOUNTER — Ambulatory Visit (INDEPENDENT_AMBULATORY_CARE_PROVIDER_SITE_OTHER): Payer: Medicare Other | Admitting: Internal Medicine

## 2011-07-03 VITALS — BP 148/60 | HR 86 | Temp 98.4°F | Resp 18 | Wt 196.0 lb

## 2011-07-03 DIAGNOSIS — E1349 Other specified diabetes mellitus with other diabetic neurological complication: Secondary | ICD-10-CM

## 2011-07-03 DIAGNOSIS — E1149 Type 2 diabetes mellitus with other diabetic neurological complication: Secondary | ICD-10-CM

## 2011-07-03 DIAGNOSIS — E785 Hyperlipidemia, unspecified: Secondary | ICD-10-CM | POA: Diagnosis not present

## 2011-07-03 DIAGNOSIS — E1159 Type 2 diabetes mellitus with other circulatory complications: Secondary | ICD-10-CM

## 2011-07-03 DIAGNOSIS — G4700 Insomnia, unspecified: Secondary | ICD-10-CM | POA: Insufficient documentation

## 2011-07-03 DIAGNOSIS — E1151 Type 2 diabetes mellitus with diabetic peripheral angiopathy without gangrene: Secondary | ICD-10-CM | POA: Insufficient documentation

## 2011-07-03 DIAGNOSIS — E1142 Type 2 diabetes mellitus with diabetic polyneuropathy: Secondary | ICD-10-CM

## 2011-07-03 DIAGNOSIS — E1340 Other specified diabetes mellitus with diabetic neuropathy, unspecified: Secondary | ICD-10-CM

## 2011-07-03 DIAGNOSIS — I798 Other disorders of arteries, arterioles and capillaries in diseases classified elsewhere: Secondary | ICD-10-CM | POA: Diagnosis not present

## 2011-07-03 DIAGNOSIS — I10 Essential (primary) hypertension: Secondary | ICD-10-CM

## 2011-07-03 DIAGNOSIS — L989 Disorder of the skin and subcutaneous tissue, unspecified: Secondary | ICD-10-CM | POA: Diagnosis not present

## 2011-07-03 MED ORDER — "INSULIN SYRINGE-NEEDLE U-100 31G X 5/16"" 0.5 ML MISC": 1.0000 "application " | Freq: Three times a day (TID) | Status: DC

## 2011-07-03 MED ORDER — INSULIN ASPART PROT & ASPART (70-30 MIX) 100 UNIT/ML ~~LOC~~ SUSP: 18.0000 [IU] | Freq: Three times a day (TID) | SUBCUTANEOUS | Status: DC

## 2011-07-03 MED ORDER — GABAPENTIN 100 MG PO CAPS: ORAL_CAPSULE | ORAL | Status: DC

## 2011-07-03 MED ORDER — ATORVASTATIN CALCIUM 40 MG PO TABS: 20.0000 mg | ORAL_TABLET | Freq: Every day | ORAL | Status: DC

## 2011-07-03 NOTE — Patient Instructions (Signed)
We are reducing your lipitor to 20 mg daily  For 3 months   trial of gapentin for insomnia and tingling  Return in 3 monhtsw for fasting labs and OV

## 2011-07-07 ENCOUNTER — Encounter: Payer: Self-pay | Admitting: Internal Medicine

## 2011-07-07 NOTE — Assessment & Plan Note (Signed)
His GFR has been stable.  continue ARB.

## 2011-07-07 NOTE — Assessment & Plan Note (Signed)
Trial of gabapentin offered today for qhs use.

## 2011-07-07 NOTE — Assessment & Plan Note (Signed)
Well controlled on current medications.  No changes today. 

## 2011-07-07 NOTE — Assessment & Plan Note (Signed)
Managed with OTC aids.  Trial of gabapentin for neuropathy discussed with sedating effects of medication hopefully helpful to condition.

## 2011-07-07 NOTE — Progress Notes (Signed)
Patient ID: [name], male   DOB: 1924/03/21, 78 y.o.   MRN: [id number] Patient Active Problem List  Diagnoses  . Hypertension  . Hyperlipidemia LDL goal < 70  . Coronary artery occlusion without or not resulting in myocardial infarction  . Spinal stenosis of lumbar region  . Screening for colon cancer  . Neuropathy due to secondary diabetes  . Peripheral vascular disease due to secondary diabetes  . Peripheral artery disease  . Kidney disease, chronic, stage III (moderate, EGFR 30-59 ml/min)  . Diabetes mellitus with peripheral vascular disease  . Skin lesion of scalp  . Insomnia    Subjective:  CC:   Chief Complaint  Patient presents with  . Follow-up    3 month    HPI:   [name] is a 78 y.o. male who presents Follow up on chronic conditions including diabetes mellitus type 2 now insulin dependent., hypertension and hyperlipidemia.  He has been using 70/30 insulin three times daily despite my recommendations at prior visits to reduce to twice daily . He denies any history of hypglycemic episodes  And has ho trouble keeping his blood sugars in range with current regimen.  Fasting sugars have for the most part been less than 140.  Post prandials < 180.  Jenell Milliner some tingling/numbness/burnign in feet mainly at night,  Which is interfering with sleep. Checks the soles of his feet daily with bathing and does not walk barefoot.Marland Kitchen  Has seen his opthalmologist within th e last 12 months.    Past Medical History  Diagnosis Date  . H/O: malaria 48    in [place]  . Hyperlipidemia   . Hypertension   . Diabetes mellitus   . Renal insufficiency     by Nov 2012 labs, no old records available  . Peripheral artery disease     folowed by Dew post bypass 2011 [place]  . Blood transfusion   . Coronary artery disease     h/o bypass coronary- 2001 & peripheral- 2011 , last full cardiac visit  in [place] , [place], [employer]    Past Surgical History  Procedure Date    . Spine surgery   . Coronary artery bypass graft 2001    4 vessel, done [place]   . Pacemaker insertion   . Femoral bypass 2011    done in [place],  now followed by [facility]  . Insert / replace / remove pacemaker     2004  . Lumbar laminectomy/decompression microdiscectomy 01/16/2011    Procedure: LUMBAR LAMINECTOMY/DECOMPRESSION MICRODISCECTOMY;  Surgeon: Ophelia Charter;  Location: [facility];  Service: Neurosurgery;  Laterality: N/A;  Lumbar four and lumbar five laminectomies          The following portions of the patient's history were reviewed and updated as appropriate: Allergies, current medications, and problem list.    Review of Systems:   12 Pt  review of systems was negative except those addressed in the HPI,     History   Social History  . Marital Status: Married    Spouse Name: N/A    Number of Children: N/A  . Years of Education: N/A   Occupational History  . Not on file.   Social History Main Topics  . Smoking status: Former Smoker    Quit date: 09/27/1985  . Smokeless tobacco: Never Used  . Alcohol Use: No  . Drug Use: No  . Sexually Active: Not on file   Other Topics Concern  .  Not on file   Social History Narrative  . No narrative on file    Objective:  BP 148/60  Pulse 86  Temp(Src) 98.4 F (36.9 C) (Oral)  Resp 18  Wt 196 lb (88.905 kg)  SpO2 97%  General appearance: alert, cooperative and appears stated age Ears: normal TM's and external ear canals both ears Throat: lips, mucosa, and tongue normal; teeth and gums normal Neck: no adenopathy, no carotid bruit, supple, symmetrical, trachea midline and thyroid not enlarged, symmetric, no tenderness/mass/nodules Back: symmetric, no curvature. ROM normal. No CVA tenderness. Lungs: clear to auscultation bilaterally Heart: regular rate and rhythm, S1, S2 normal, no murmur, click, rub or gallop Abdomen: soft, non-tender; bowel sounds normal; no masses,  no organomegaly Pulses:  2+ and symmetric Skin: Skin color, texture, turgor normal. No rashes or lesions Lymph nodes: Cervical, supraclavicular, and axillary nodes normal.  Assessment and Plan:  Hyperlipidemia LDL goal < 70 His LDL is now 40 due to increase in dose of statin after last fasting lipids panel,  Will decrease dose today for goal LDL of 70.   Hypertension Well controlled on current medications.  No changes today.  Neuropathy due to secondary diabetes Trial of gabapentin offered today for qhs use.   Kidney disease, chronic, stage III (moderate, EGFR 30-59 ml/min) His GFR has been stable.  continue ARB.   Insomnia Managed with OTC aids.  Trial of gabapentin for neuropathy discussed with sedating effects of medication hopefully helpful to condition.     Updated Medication List Outpatient Encounter Prescriptions as of 07/03/2011  Medication Sig Dispense Refill  . amLODipine (NORVASC) 10 MG tablet Take 1 tablet (10 mg total) by mouth daily.  90 tablet  3  . aspirin 325 MG tablet Take 325 mg by mouth daily.        Marland Kitchen atorvastatin (LIPITOR) 40 MG tablet Take 0.5 tablets (20 mg total) by mouth daily.  90 tablet  2  . Calcium Carbonate-Vitamin D (CALCIUM + D) 600-200 MG-UNIT TABS Take 1 tablet by mouth 2 (two) times daily.        . carbamazepine (TEGRETOL) 200 MG tablet Take 200 mg by mouth daily.        . clopidogrel (PLAVIX) 75 MG tablet Take 1 tablet (75 mg total) by mouth daily.  90 tablet  3  . fenofibrate (TRICOR) 48 MG tablet Take 1 tablet (48 mg total) by mouth daily.  90 tablet  3  . glucose blood (ACCU-CHEK AVIVA PLUS) test strip Check blood sugar 4 times a day  400 each  3  . insulin aspart protamine-insulin aspart (NOVOLOG 70/30) (70-30) 100 UNIT/ML injection Inject 18 Units into the skin 3 (three) times daily before meals. 18 units three times a day  Means he need  7.5 pens per month or 22 pens per 90 day period  72 mL  3  . metoprolol (LOPRESSOR) 50 MG tablet Take 1 tablet (50 mg total) by  mouth 2 (two) times daily.  90 tablet  3  . valsartan (DIOVAN) 160 MG tablet Take 1 tablet (160 mg total) by mouth daily.  90 tablet  3  . DISCONTD: atorvastatin (LIPITOR) 40 MG tablet Take 1 tablet (40 mg total) by mouth daily.  90 tablet  2  . DISCONTD: insulin aspart protamine-insulin aspart (NOVOLOG 70/30) (70-30) 100 UNIT/ML injection 18 units three times a day      . gabapentin (NEURONTIN) 100 MG capsule 1 to 3 capsules at bedtime as needed for tingling/numbness  90 capsule  3  . Insulin Syringe-Needle U-100 (B-D INS SYRINGE 0.5CC/31GX5/16) 31G X 5/16" 0.5 ML MISC 1 application by Does not apply route 3 (three) times daily before meals.  300 each  3  . DISCONTD: cyclobenzaprine (FLEXERIL) 10 MG tablet Take 1 tablet (10 mg total) by mouth daily.  90 tablet  0     No orders of the defined types were placed in this encounter.    No Follow-up on file.

## 2011-07-07 NOTE — Assessment & Plan Note (Signed)
His LDL is now 40 due to increase in dose of statin after last fasting lipids panel,  Will decrease dose today for goal LDL of 70.

## 2011-07-12 DIAGNOSIS — Z961 Presence of intraocular lens: Secondary | ICD-10-CM | POA: Diagnosis not present

## 2011-07-12 DIAGNOSIS — H40009 Preglaucoma, unspecified, unspecified eye: Secondary | ICD-10-CM | POA: Diagnosis not present

## 2011-09-06 DIAGNOSIS — I70219 Atherosclerosis of native arteries of extremities with intermittent claudication, unspecified extremity: Secondary | ICD-10-CM | POA: Diagnosis not present

## 2011-09-16 DIAGNOSIS — L570 Actinic keratosis: Secondary | ICD-10-CM | POA: Diagnosis not present

## 2011-09-16 DIAGNOSIS — D235 Other benign neoplasm of skin of trunk: Secondary | ICD-10-CM | POA: Diagnosis not present

## 2011-09-17 DIAGNOSIS — I495 Sick sinus syndrome: Secondary | ICD-10-CM | POA: Diagnosis not present

## 2011-09-17 DIAGNOSIS — I2510 Atherosclerotic heart disease of native coronary artery without angina pectoris: Secondary | ICD-10-CM | POA: Diagnosis not present

## 2011-09-30 ENCOUNTER — Telehealth: Payer: Self-pay | Admitting: Internal Medicine

## 2011-09-30 DIAGNOSIS — E119 Type 2 diabetes mellitus without complications: Secondary | ICD-10-CM

## 2011-09-30 NOTE — Telephone Encounter (Signed)
Left detailed message asking patient to call the office back to schedule an appt.

## 2011-09-30 NOTE — Telephone Encounter (Addendum)
Patient wants blood work prior to him coming for his office visit on September 10 th. I don't see any labs in the system. Please give patient an appointment if he is to have lab work.

## 2011-09-30 NOTE — Telephone Encounter (Signed)
Please call patient with lab appt.

## 2011-10-09 ENCOUNTER — Ambulatory Visit: Payer: Medicare Other | Admitting: Internal Medicine

## 2011-10-09 ENCOUNTER — Other Ambulatory Visit: Payer: Medicare Other

## 2011-10-10 ENCOUNTER — Other Ambulatory Visit: Payer: Medicare Other

## 2011-10-11 ENCOUNTER — Other Ambulatory Visit (INDEPENDENT_AMBULATORY_CARE_PROVIDER_SITE_OTHER): Payer: Medicare Other | Admitting: *Deleted

## 2011-10-11 DIAGNOSIS — E119 Type 2 diabetes mellitus without complications: Secondary | ICD-10-CM

## 2011-10-11 LAB — LIPID PANEL
Total CHOL/HDL Ratio: 3
Triglycerides: 92 mg/dL (ref 0.0–149.0)

## 2011-10-11 LAB — COMPREHENSIVE METABOLIC PANEL
Alkaline Phosphatase: 68 U/L (ref 39–117)
BUN: 27 mg/dL — ABNORMAL HIGH (ref 6–23)
Creatinine, Ser: 1.7 mg/dL — ABNORMAL HIGH (ref 0.4–1.5)
GFR: 40.63 mL/min — ABNORMAL LOW (ref >60.00)
Glucose, Bld: 86 mg/dL (ref 70–99)
Sodium: 142 mEq/L (ref 135–145)
Total Bilirubin: 0.5 mg/dL (ref 0.3–1.2)
Total Protein: 7 g/dL (ref 6.0–8.3)

## 2011-10-11 LAB — MICROALBUMIN / CREATININE URINE RATIO: Microalb, Ur: 6.9 mg/dL — ABNORMAL HIGH (ref 0.0–1.9)

## 2011-10-11 LAB — HEMOGLOBIN A1C: Hgb A1c MFr Bld: 6.5 % (ref 4.6–6.5)

## 2011-10-15 ENCOUNTER — Ambulatory Visit: Payer: Medicare Other | Admitting: Internal Medicine

## 2011-10-15 DIAGNOSIS — I495 Sick sinus syndrome: Secondary | ICD-10-CM | POA: Diagnosis not present

## 2011-10-21 ENCOUNTER — Encounter: Payer: Self-pay | Admitting: Internal Medicine

## 2011-10-21 ENCOUNTER — Ambulatory Visit (INDEPENDENT_AMBULATORY_CARE_PROVIDER_SITE_OTHER): Payer: Medicare Other | Admitting: Internal Medicine

## 2011-10-21 VITALS — BP 140/62 | HR 88 | Temp 98.5°F | Resp 16 | Wt 197.5 lb

## 2011-10-21 DIAGNOSIS — Z23 Encounter for immunization: Secondary | ICD-10-CM

## 2011-10-21 DIAGNOSIS — I798 Other disorders of arteries, arterioles and capillaries in diseases classified elsewhere: Secondary | ICD-10-CM

## 2011-10-21 DIAGNOSIS — I10 Essential (primary) hypertension: Secondary | ICD-10-CM | POA: Diagnosis not present

## 2011-10-21 DIAGNOSIS — E785 Hyperlipidemia, unspecified: Secondary | ICD-10-CM

## 2011-10-21 DIAGNOSIS — E1340 Other specified diabetes mellitus with diabetic neuropathy, unspecified: Secondary | ICD-10-CM

## 2011-10-21 DIAGNOSIS — R600 Localized edema: Secondary | ICD-10-CM | POA: Insufficient documentation

## 2011-10-21 DIAGNOSIS — E1151 Type 2 diabetes mellitus with diabetic peripheral angiopathy without gangrene: Secondary | ICD-10-CM

## 2011-10-21 DIAGNOSIS — E1149 Type 2 diabetes mellitus with other diabetic neurological complication: Secondary | ICD-10-CM

## 2011-10-21 DIAGNOSIS — E1349 Other specified diabetes mellitus with other diabetic neurological complication: Secondary | ICD-10-CM

## 2011-10-21 DIAGNOSIS — R609 Edema, unspecified: Secondary | ICD-10-CM

## 2011-10-21 DIAGNOSIS — E1142 Type 2 diabetes mellitus with diabetic polyneuropathy: Secondary | ICD-10-CM

## 2011-10-21 DIAGNOSIS — E1159 Type 2 diabetes mellitus with other circulatory complications: Secondary | ICD-10-CM

## 2011-10-21 MED ORDER — FUROSEMIDE 20 MG PO TABS: ORAL_TABLET | ORAL | Status: DC

## 2011-10-21 MED ORDER — ATORVASTATIN CALCIUM 20 MG PO TABS: 20.0000 mg | ORAL_TABLET | Freq: Every day | ORAL | Status: DC

## 2011-10-21 NOTE — Assessment & Plan Note (Signed)
Well-controlled on a torsed and 40 mg we have reduced his dose to 20 mg for LDL 46 currently.

## 2011-10-21 NOTE — Progress Notes (Signed)
Patient ID: [name], male   DOB: Mar 19, 1924, 78 y.o.   MRN: [id number]  Patient Active Problem List  Diagnosis  . Hypertension  . Hyperlipidemia LDL goal < 70  . Coronary artery occlusion without or not resulting in myocardial infarction  . Spinal stenosis of lumbar region  . Screening for colon cancer  . Neuropathy due to secondary diabetes  . Peripheral vascular disease due to secondary diabetes  . Peripheral artery disease  . Kidney disease, chronic, stage III (moderate, EGFR 30-59 ml/min)  . Diabetes mellitus with peripheral vascular disease  . Skin lesion of scalp  . Insomnia  . Edema extremities    Subjective:  CC:   Chief Complaint  Patient presents with  . Follow-up    3 month    HPI:   [name] a 78 y.o. male who presents for a 3 month follow up on diabetes, hyperlipidemia and neuropathy.  He has brought a log of his blood sugars which shows that his fastings are in her ranging from 78-120. His evenings are usually 110 to 140. He has had several occasions where his blood sugar in the evening is 150-170 at which time he has started taking 12 units of insulin this as he has noticed that if he does note that his fastings are in the 150 range the following morning. He continues to have insomnia which has not improved with trial of gabapentin. He has noticed increased lower extremity edema since starting the gabapentin. He does not have lower extremity pain but is bothered by a feeling og numbness affecting both feet.    Past Medical History  Diagnosis Date  . H/O: malaria 38    in [place]  . Hyperlipidemia   . Hypertension   . Diabetes mellitus   . Renal insufficiency     by Nov 2012 labs, no old records available  . Peripheral artery disease     folowed by Dew post bypass 2011 [place]  . Blood transfusion   . Coronary artery disease     h/o bypass coronary- 2001 & peripheral- 2011 , last full cardiac visit  in [place] , [place], [employer]     Past Surgical History  Procedure Date  . Spine surgery   . Coronary artery bypass graft 2001    4 vessel, done [place]   . Pacemaker insertion   . Femoral bypass 2011    done in [place],  now followed by [facility]  . Insert / replace / remove pacemaker     2004  . Lumbar laminectomy/decompression microdiscectomy 01/16/2011    Procedure: LUMBAR LAMINECTOMY/DECOMPRESSION MICRODISCECTOMY;  Surgeon: Ophelia Charter;  Location: [facility];  Service: Neurosurgery;  Laterality: N/A;  Lumbar four and lumbar five laminectomies          The following portions of the patient's history were reviewed and updated as appropriate: Allergies, current medications, and problem list.    Review of Systems:   A comprehensive ROS was done and positive for edema and numbness.   The rest was negative.  History   Social History  . Marital Status: Married    Spouse Name: N/A    Number of Children: N/A  . Years of Education: N/A   Occupational History  . Not on file.   Social History Main Topics  . Smoking status: Former Smoker    Quit date: 09/27/1985  . Smokeless tobacco: Never Used  . Alcohol Use: No  . Drug Use:  No  . Sexually Active: Not on file   Other Topics Concern  . Not on file   Social History Narrative  . No narrative on file    Objective:  BP 140/62  Pulse 88  Temp 98.5 F (36.9 C) (Oral)  Resp 16  Wt 197 lb 8 oz (89.585 kg)  SpO2 96%  General appearance: alert, cooperative and appears stated age Ears: normal TM's and external ear canals both ears Throat: lips, mucosa, and tongue normal; teeth and gums normal Neck: no adenopathy, no carotid bruit, supple, symmetrical, trachea midline and thyroid not enlarged, symmetric, no tenderness/mass/nodules Back: symmetric, no curvature. ROM normal. No CVA tenderness. Lungs: clear to auscultation bilaterally Heart: regular rate and rhythm, S1, S2 normal, no murmur, click, rub or gallop Abdomen: soft, non-tender;  bowel sounds normal; no masses,  no organomegaly Pulses: 2+ and symmetric Skin: Skin color, texture, turgor normal. No rashes or lesions Lymph nodes: Cervical, supraclavicular, and axillary nodes normal.  Assessment and Plan:  Hyperlipidemia LDL goal < 70 Well-controlled on a torsed and 40 mg we have reduced his dose to 20 mg for LDL 46 currently.  Hypertension Well controlled on current regimen. Renal function stable, no changes today.  Neuropathy due to secondary diabetes Since then gabapentin he has noticed increased lower extremity edema and his nausea the gabapentin is helping at all. Suggested he stop it to see whether he is making a difference in his neuropathic symptoms.  Diabetes mellitus with peripheral vascular disease Been well controlled on tid 70/30 insulin.  He's had no recent hypoglycemic events. A1c is less than 7. No changes today   Edema extremities Chronic, aggravated by use of gabapentin. Continue suppressive and not more than twice weekly.   Updated Medication List Outpatient Encounter Prescriptions as of 10/21/2011  Medication Sig Dispense Refill  . amLODipine (NORVASC) 10 MG tablet Take 1 tablet (10 mg total) by mouth daily.  90 tablet  3  . aspirin 325 MG tablet Take 325 mg by mouth daily.        . Calcium Carbonate-Vitamin D (CALCIUM + D) 600-200 MG-UNIT TABS Take 1 tablet by mouth 2 (two) times daily.        . clopidogrel (PLAVIX) 75 MG tablet Take 1 tablet (75 mg total) by mouth daily.  90 tablet  3  . fenofibrate (TRICOR) 48 MG tablet Take 1 tablet (48 mg total) by mouth daily.  90 tablet  3  . gabapentin (NEURONTIN) 100 MG capsule 1 to 3 capsules at bedtime as needed for tingling/numbness  90 capsule  3  . glucose blood (ACCU-CHEK AVIVA PLUS) test strip Check blood sugar 4 times a day  400 each  3  . insulin aspart protamine-insulin aspart (NOVOLOG 70/30) (70-30) 100 UNIT/ML injection Inject 18 Units into the skin 3 (three) times daily before meals. 18  units three times a day  Means he need  7.5 pens per month or 22 pens per 90 day period  72 mL  3  . Insulin Syringe-Needle U-100 (B-D INS SYRINGE 0.5CC/31GX5/16) 31G X 5/16" 0.5 ML MISC 1 application by Does not apply route 3 (three) times daily before meals.  300 each  3  . metoprolol (LOPRESSOR) 50 MG tablet Take 1 tablet (50 mg total) by mouth 2 (two) times daily.  90 tablet  3  . valsartan (DIOVAN) 160 MG tablet Take 1 tablet (160 mg total) by mouth daily.  90 tablet  3  . DISCONTD: atorvastatin (LIPITOR) 40 MG  tablet Take 0.5 tablets (20 mg total) by mouth daily.  90 tablet  2  . atorvastatin (LIPITOR) 20 MG tablet Take 1 tablet (20 mg total) by mouth daily.  90 tablet  3  . furosemide (LASIX) 20 MG tablet One tablet  once or twice weekly as needed for fluid retention  30 tablet  3  . DISCONTD: carbamazepine (TEGRETOL) 200 MG tablet Take 200 mg by mouth daily.           No orders of the defined types were placed in this encounter.    Return in about 3 months (around 01/20/2012).

## 2011-10-21 NOTE — Assessment & Plan Note (Signed)
Chronic, aggravated by use of gabapentin. Continue suppressive and not more than twice weekly.

## 2011-10-21 NOTE — Patient Instructions (Addendum)
Your diabetes and cholesterol are under excellent control  I have sent a new prescription for 20 [facility]atin ([facility]itor ) [facility]   Your gabapentin may be aggravating your lower extremity edema.  Suspend the gabapentin to see if it is making a difference in your foot numbness. If it isn't , stop it.   You can use furosemide once or twice a week if needed for  fluid retention.,  Take in the morning whe you first get up.

## 2011-10-21 NOTE — Assessment & Plan Note (Signed)
Well controlled on current regimen. Renal function stable, no changes today. 

## 2011-10-21 NOTE — Assessment & Plan Note (Signed)
Since then gabapentin he has noticed increased lower extremity edema and his nausea the gabapentin is helping at all. Suggested he stop it to see whether he is making a difference in his neuropathic symptoms.

## 2011-10-21 NOTE — Assessment & Plan Note (Signed)
Been well controlled on tid 70/30 insulin.  He's had no recent hypoglycemic events. A1c is less than 7. No changes today

## 2011-11-05 ENCOUNTER — Encounter: Payer: Self-pay | Admitting: Internal Medicine

## 2011-11-06 NOTE — Telephone Encounter (Signed)
Dr. Derrel Nip, can you help with this?

## 2011-11-13 DIAGNOSIS — H40009 Preglaucoma, unspecified, unspecified eye: Secondary | ICD-10-CM | POA: Diagnosis not present

## 2011-12-12 ENCOUNTER — Other Ambulatory Visit: Payer: Self-pay | Admitting: Internal Medicine

## 2011-12-12 ENCOUNTER — Other Ambulatory Visit: Payer: Self-pay

## 2011-12-12 MED ORDER — METOPROLOL TARTRATE 50 MG PO TABS: 50.0000 mg | ORAL_TABLET | Freq: Two times a day (BID) | ORAL | Status: DC

## 2011-12-12 NOTE — Telephone Encounter (Signed)
Refill request for Metoprolol 50 mg #90 1 R sent to [facility]. Advised patient needs OV for further refills.

## 2012-01-10 ENCOUNTER — Telehealth: Payer: Self-pay | Admitting: *Deleted

## 2012-01-10 NOTE — Telephone Encounter (Signed)
This patient has labs (Monday, Dec 9th) what labs and diagnostic code would you like for this patient?

## 2012-01-10 NOTE — Telephone Encounter (Signed)
cmet and a1c  250.00 thanks

## 2012-01-13 ENCOUNTER — Other Ambulatory Visit (INDEPENDENT_AMBULATORY_CARE_PROVIDER_SITE_OTHER): Payer: Medicare Other

## 2012-01-13 DIAGNOSIS — E119 Type 2 diabetes mellitus without complications: Secondary | ICD-10-CM

## 2012-01-13 LAB — HEMOGLOBIN A1C: Hgb A1c MFr Bld: 6.7 % — ABNORMAL HIGH (ref 4.6–6.5)

## 2012-01-13 LAB — COMPREHENSIVE METABOLIC PANEL
ALT: 16 U/L (ref 0–53)
AST: 17 U/L (ref 0–37)
Albumin: 4.1 g/dL (ref 3.5–5.2)
Alkaline Phosphatase: 62 U/L (ref 39–117)
Calcium: 9.1 mg/dL (ref 8.4–10.5)
Chloride: 107 mEq/L (ref 96–112)
Potassium: 4.5 mEq/L (ref 3.5–5.1)
Sodium: 141 mEq/L (ref 135–145)
Total Protein: 6.9 g/dL (ref 6.0–8.3)

## 2012-01-20 ENCOUNTER — Encounter: Payer: Self-pay | Admitting: Internal Medicine

## 2012-01-20 ENCOUNTER — Ambulatory Visit (INDEPENDENT_AMBULATORY_CARE_PROVIDER_SITE_OTHER): Payer: Medicare Other | Admitting: Internal Medicine

## 2012-01-20 VITALS — BP 138/62 | HR 95 | Temp 98.0°F | Resp 12 | Ht 67.5 in | Wt 193.8 lb

## 2012-01-20 DIAGNOSIS — E1349 Other specified diabetes mellitus with other diabetic neurological complication: Secondary | ICD-10-CM

## 2012-01-20 DIAGNOSIS — Z1331 Encounter for screening for depression: Secondary | ICD-10-CM

## 2012-01-20 DIAGNOSIS — I798 Other disorders of arteries, arterioles and capillaries in diseases classified elsewhere: Secondary | ICD-10-CM

## 2012-01-20 DIAGNOSIS — E1159 Type 2 diabetes mellitus with other circulatory complications: Secondary | ICD-10-CM

## 2012-01-20 DIAGNOSIS — E119 Type 2 diabetes mellitus without complications: Secondary | ICD-10-CM

## 2012-01-20 DIAGNOSIS — Z1211 Encounter for screening for malignant neoplasm of colon: Secondary | ICD-10-CM

## 2012-01-20 DIAGNOSIS — E1142 Type 2 diabetes mellitus with diabetic polyneuropathy: Secondary | ICD-10-CM

## 2012-01-20 DIAGNOSIS — I10 Essential (primary) hypertension: Secondary | ICD-10-CM

## 2012-01-20 DIAGNOSIS — E785 Hyperlipidemia, unspecified: Secondary | ICD-10-CM

## 2012-01-20 DIAGNOSIS — E1151 Type 2 diabetes mellitus with diabetic peripheral angiopathy without gangrene: Secondary | ICD-10-CM

## 2012-01-20 DIAGNOSIS — E1340 Other specified diabetes mellitus with diabetic neuropathy, unspecified: Secondary | ICD-10-CM

## 2012-01-20 NOTE — Progress Notes (Signed)
Patient ID: [name], male   DOB: Feb 09, 1924, 78 y.o.   MRN: [id number]  Patient Active Problem List  Diagnosis  . Hypertension  . Hyperlipidemia LDL goal < 70  . Coronary artery occlusion without or not resulting in myocardial infarction  . Spinal stenosis of lumbar region  . Screening for colon cancer  . Neuropathy due to secondary diabetes  . Peripheral vascular disease due to secondary diabetes  . Peripheral artery disease  . Kidney disease, chronic, stage III (moderate, EGFR 30-59 ml/min)  . Diabetes mellitus with peripheral vascular disease  . Skin lesion of scalp  . Insomnia  . Edema extremities    Subjective:  CC:   Chief Complaint  Patient presents with  . Follow-up    HPI:   [name] a 78 y.o. male who presents for 3 month followup on diabetes hypertension and hyperlipidemia. He feels generally well. He is taking his medications without side effects or lapses. His blood sugars have been within range of 80-120 both fasting and preprandial. Postprandial blood sugars have been from 120-150 historically. He is sleeping well with occasional episodes of insomnia managed without medications. He is active physically. He has had a diabetic eye exam in the last year.   Past Medical History  Diagnosis Date  . H/O: malaria 55    in [place]  . Hyperlipidemia   . Hypertension   . Diabetes mellitus   . Renal insufficiency     by Nov 2012 labs, no old records available  . Peripheral artery disease     folowed by Dew post bypass 2011 [place]  . Blood transfusion   . Coronary artery disease     h/o bypass coronary- 2001 & peripheral- 2011 , last full cardiac visit  in [place] , [place], [employer]    Past Surgical History  Procedure Date  . Spine surgery   . Coronary artery bypass graft 2001    4 vessel, done [place]   . Pacemaker insertion   . Femoral bypass 2011    done in [place],  now followed by [facility]  . Insert / replace / remove  pacemaker     2004  . Lumbar laminectomy/decompression microdiscectomy 01/16/2011    Procedure: LUMBAR LAMINECTOMY/DECOMPRESSION MICRODISCECTOMY;  Surgeon: Ophelia Charter;  Location: [place] [facility];  Service: Neurosurgery;  Laterality: N/A;  Lumbar four and lumbar five laminectomies          The following portions of the patient's history were reviewed and updated as appropriate: Allergies, current medications, and problem list.    Review of Systems:  Patient denies headache, fevers, malaise, unintentional weight loss, skin rash, eye pain, sinus congestion and sinus pain, sore throat, dysphagia,  hemoptysis , cough, dyspnea, wheezing, chest pain, palpitations, orthopnea, edema, abdominal pain, nausea, melena, diarrhea, constipation, flank pain, dysuria, hematuria, urinary  Frequency, nocturia, numbness, tingling, seizures,  Focal weakness, Loss of consciousness,  Tremor, insomnia, depression, anxiety, and suicidal ideation.       History   Social History  . Marital Status: Married    Spouse Name: N/A    Number of Children: N/A  . Years of Education: N/A   Occupational History  . Not on file.   Social History Main Topics  . Smoking status: Former Smoker    Quit date: 09/27/1985  . Smokeless tobacco: Never Used  . Alcohol Use: No  . Drug Use: No  . Sexually Active: Not on file   Other Topics Concern  .  Not on file   Social History Narrative  . No narrative on file    Objective:  BP 138/62  Pulse 95  Temp 98 F (36.7 C) (Oral)  Resp 12  Ht 5' 7.5" (1.715 m)  Wt 193 lb 12 oz (87.884 kg)  BMI 29.90 kg/m2  SpO2 97%  General appearance: alert, cooperative and appears stated age Ears: normal TM's and external ear canals both ears Throat: lips, mucosa, and tongue normal; teeth and gums normal Neck: no adenopathy, no carotid bruit, supple, symmetrical, trachea midline and thyroid not enlarged, symmetric, no tenderness/mass/nodules Back: symmetric, no curvature.  ROM normal. No CVA tenderness. Lungs: clear to auscultation bilaterally Heart: regular rate and rhythm, S1, S2 normal, no murmur, click, rub or gallop Abdomen: soft, non-tender; bowel sounds normal; no masses,  no organomegaly Pulses: 2+ and symmetric Skin: Skin color, texture, turgor normal. No rashes or lesions Lymph nodes: Cervical, supraclavicular, and axillary nodes normal.  Assessment and Plan:  Neuropathy due to secondary diabetes Feet are in good condition. Trial of gabapentin did not help his insomnia.  Hypertension well controlled on current regimen. Renal function is stable. No changes today.   Hyperlipidemia LDL goal < 70 Well-controlled on current regimen. No changes today.  Diabetes mellitus with peripheral vascular disease Well-controlled on current regimen. No changes today.  Kidney disease, chronic, stage III (moderate, EGFR 30-59 ml/min) Secondary to diabetes mellitus. His creatinine is stable by repeat labs.  Screening for colon cancer No further screening given his age.   Updated Medication List Outpatient Encounter Prescriptions as of 01/20/2012  Medication Sig Dispense Refill  . amLODipine (NORVASC) 10 MG tablet Take 1 tablet (10 mg total) by mouth daily.  90 tablet  3  . aspirin 325 MG tablet Take 325 mg by mouth daily.        Marland Kitchen atorvastatin (LIPITOR) 20 MG tablet Take 1 tablet (20 mg total) by mouth daily.  90 tablet  3  . Calcium Carbonate-Vitamin D (CALCIUM + D) 600-200 MG-UNIT TABS Take 1 tablet by mouth 2 (two) times daily.        . clopidogrel (PLAVIX) 75 MG tablet Take 1 tablet (75 mg total) by mouth daily.  90 tablet  3  . fenofibrate (TRICOR) 48 MG tablet Take 1 tablet (48 mg total) by mouth daily.  90 tablet  3  . glucose blood (ACCU-CHEK AVIVA PLUS) test strip Check blood sugar 4 times a day  400 each  3  . insulin aspart protamine-insulin aspart (NOVOLOG 70/30) (70-30) 100 UNIT/ML injection Inject 18 Units into the skin 3 (three) times daily  before meals. 18 units three times a day  Means he need  7.5 pens per month or 22 pens per 90 day period  72 mL  3  . Insulin Syringe-Needle U-100 (B-D INS SYRINGE 0.5CC/31GX5/16) 31G X 5/16" 0.5 ML MISC 1 application by Does not apply route 3 (three) times daily before meals.  300 each  3  . metoprolol (LOPRESSOR) 50 MG tablet Take 1 tablet (50 mg total) by mouth 2 (two) times daily.  90 tablet  1  . valsartan (DIOVAN) 160 MG tablet Take 1 tablet (160 mg total) by mouth daily.  90 tablet  3  . furosemide (LASIX) 20 MG tablet One tablet  once or twice weekly as needed for fluid retention  30 tablet  3  . gabapentin (NEURONTIN) 100 MG capsule 1 to 3 capsules at bedtime as needed for tingling/numbness  90 capsule  3  Orders Placed This Encounter  Procedures  . Microalbumin / creatinine urine ratio  . Comprehensive metabolic panel  . Hemoglobin A1c  . Lipid panel    No Follow-up on file.

## 2012-01-22 ENCOUNTER — Encounter: Payer: Self-pay | Admitting: Internal Medicine

## 2012-01-22 NOTE — Assessment & Plan Note (Signed)
No further screening given his age.

## 2012-01-22 NOTE — Assessment & Plan Note (Signed)
Feet are in good condition. Trial of gabapentin did not help his insomnia.

## 2012-01-22 NOTE — Assessment & Plan Note (Signed)
well controlled on current regimen. Renal function is stable. No changes today.

## 2012-01-22 NOTE — Assessment & Plan Note (Signed)
Secondary to diabetes mellitus. His creatinine is stable by repeat labs.

## 2012-01-22 NOTE — Assessment & Plan Note (Signed)
Well-controlled on current regimen. No changes today. 

## 2012-01-27 ENCOUNTER — Other Ambulatory Visit: Payer: Self-pay | Admitting: Internal Medicine

## 2012-01-27 ENCOUNTER — Other Ambulatory Visit: Payer: Self-pay | Admitting: General Practice

## 2012-01-27 MED ORDER — GLUCOSE BLOOD VI STRP: ORAL_STRIP | Status: DC

## 2012-01-27 NOTE — Telephone Encounter (Signed)
Med filled.  

## 2012-01-27 NOTE — Telephone Encounter (Signed)
Pt came in today needing refill on accu-check tes avia pl Use to check blood sugar 4 times a day  Pt has about 5 left [address] [address]

## 2012-03-10 ENCOUNTER — Other Ambulatory Visit: Payer: Self-pay | Admitting: General Practice

## 2012-03-10 MED ORDER — METOPROLOL TARTRATE 50 MG PO TABS: 50.0000 mg | ORAL_TABLET | Freq: Two times a day (BID) | ORAL | Status: DC

## 2012-03-11 DIAGNOSIS — I70219 Atherosclerosis of native arteries of extremities with intermittent claudication, unspecified extremity: Secondary | ICD-10-CM | POA: Diagnosis not present

## 2012-03-11 DIAGNOSIS — E785 Hyperlipidemia, unspecified: Secondary | ICD-10-CM | POA: Diagnosis not present

## 2012-03-11 DIAGNOSIS — I10 Essential (primary) hypertension: Secondary | ICD-10-CM | POA: Diagnosis not present

## 2012-03-11 DIAGNOSIS — I2510 Atherosclerotic heart disease of native coronary artery without angina pectoris: Secondary | ICD-10-CM | POA: Diagnosis not present

## 2012-03-25 DIAGNOSIS — Z961 Presence of intraocular lens: Secondary | ICD-10-CM | POA: Diagnosis not present

## 2012-03-25 DIAGNOSIS — H40009 Preglaucoma, unspecified, unspecified eye: Secondary | ICD-10-CM | POA: Diagnosis not present

## 2012-03-26 DIAGNOSIS — I10 Essential (primary) hypertension: Secondary | ICD-10-CM | POA: Diagnosis not present

## 2012-03-26 DIAGNOSIS — I2510 Atherosclerotic heart disease of native coronary artery without angina pectoris: Secondary | ICD-10-CM | POA: Diagnosis not present

## 2012-03-26 DIAGNOSIS — I495 Sick sinus syndrome: Secondary | ICD-10-CM | POA: Diagnosis not present

## 2012-04-01 ENCOUNTER — Other Ambulatory Visit: Payer: Self-pay | Admitting: *Deleted

## 2012-04-02 MED ORDER — CLOPIDOGREL BISULFATE 75 MG PO TABS: 75.0000 mg | ORAL_TABLET | Freq: Every day | ORAL | Status: DC

## 2012-04-02 MED ORDER — FENOFIBRATE 48 MG PO TABS: 48.0000 mg | ORAL_TABLET | Freq: Every day | ORAL | Status: DC

## 2012-04-02 MED ORDER — VALSARTAN 160 MG PO TABS: 160.0000 mg | ORAL_TABLET | Freq: Every day | ORAL | Status: DC

## 2012-04-20 ENCOUNTER — Other Ambulatory Visit (INDEPENDENT_AMBULATORY_CARE_PROVIDER_SITE_OTHER): Payer: Medicare Other

## 2012-04-20 DIAGNOSIS — E119 Type 2 diabetes mellitus without complications: Secondary | ICD-10-CM

## 2012-04-20 LAB — COMPREHENSIVE METABOLIC PANEL
ALT: 15 U/L (ref 0–53)
AST: 17 U/L (ref 0–37)
Albumin: 4 g/dL (ref 3.5–5.2)
BUN: 31 mg/dL — ABNORMAL HIGH (ref 6–23)
CO2: 25 mEq/L (ref 19–32)
Calcium: 8.9 mg/dL (ref 8.4–10.5)
Chloride: 108 mEq/L (ref 96–112)
Creatinine, Ser: 1.8 mg/dL — ABNORMAL HIGH (ref 0.4–1.5)
GFR: 37.52 mL/min — ABNORMAL LOW (ref >60.00)
Potassium: 4.2 mEq/L (ref 3.5–5.1)

## 2012-04-20 LAB — LIPID PANEL
Cholesterol: 100 mg/dL (ref 0–200)
LDL Cholesterol: 48 mg/dL (ref 0–99)
Total CHOL/HDL Ratio: 3

## 2012-04-21 ENCOUNTER — Encounter: Payer: Self-pay | Admitting: Internal Medicine

## 2012-04-23 ENCOUNTER — Encounter: Payer: Self-pay | Admitting: General Practice

## 2012-04-27 ENCOUNTER — Ambulatory Visit (INDEPENDENT_AMBULATORY_CARE_PROVIDER_SITE_OTHER): Payer: Medicare Other | Admitting: Internal Medicine

## 2012-04-27 ENCOUNTER — Encounter: Payer: Self-pay | Admitting: Internal Medicine

## 2012-04-27 VITALS — BP 128/64 | HR 78 | Temp 97.3°F | Resp 16 | Wt 187.0 lb

## 2012-04-27 DIAGNOSIS — E1151 Type 2 diabetes mellitus with diabetic peripheral angiopathy without gangrene: Secondary | ICD-10-CM

## 2012-04-27 DIAGNOSIS — I10 Essential (primary) hypertension: Secondary | ICD-10-CM | POA: Diagnosis not present

## 2012-04-27 DIAGNOSIS — E663 Overweight: Secondary | ICD-10-CM | POA: Insufficient documentation

## 2012-04-27 DIAGNOSIS — E785 Hyperlipidemia, unspecified: Secondary | ICD-10-CM | POA: Diagnosis not present

## 2012-04-27 DIAGNOSIS — E669 Obesity, unspecified: Secondary | ICD-10-CM

## 2012-04-27 DIAGNOSIS — N183 Chronic kidney disease, stage 3 unspecified: Secondary | ICD-10-CM

## 2012-04-27 DIAGNOSIS — I798 Other disorders of arteries, arterioles and capillaries in diseases classified elsewhere: Secondary | ICD-10-CM

## 2012-04-27 DIAGNOSIS — E1159 Type 2 diabetes mellitus with other circulatory complications: Secondary | ICD-10-CM

## 2012-04-27 LAB — HM DIABETES FOOT EXAM

## 2012-04-27 NOTE — Patient Instructions (Addendum)
This is  One version of a  "Low GI"  Diet:  It will still lower your blood sugars and allow you to lose 4 to 8  lbs  per month if you follow it carefully and combine it with 30 minutes of aerobic exercise 5 days per week .   All of the foods can be found at grocery stores and in bulk at [employer].  The Atkins protein bars and shakes are available in more varieties at [employer], [employer] and [place].     7 AM Breakfast:  Choose from the following:  Low carbohydrate Protein  Shakes (I recommend the EAS AdvantEdge "Carb Control" shakes  Or the low carb shakes by Atkins.    2.5 carbs   Arnold's "Sandwhich Thin"toasted  w/ peanut butter (no jelly: about 20 net carbs  "Bagel Thin" with cream cheese and salmon: about 20 carbs   a scrambled egg/bacon/cheese burrito made with Mission's "carb balance" whole wheat tortilla  (about 10 net carbs )   Avoid cereal and bananas, oatmeal and cream of wheat and grits. They are loaded with carbohydrates!   10 AM: high protein snack  Protein bar by Atkins (the snack size, under 200 cal, usually < 6 net carbs).    A stick of cheese:  Around 1 carb,  100 cal     Dannon Light n Fit [place] Yogurt  (80 cal, 8 carbs)  Other so called "protein bars" and Greek yogurts tend to be loaded with carbohydrates.  Remember, in food advertising, the word "energy" is synonymous for " carbohydrate."  Lunch:   A Sandwich using the bread choices listed, Can use any  Eggs,  lunchmeat, grilled meat or canned tuna), avocado, regular mayo/mustard  and cheese.  A Salad using clue cheese, ranch,  Goddess or vinagrette,  No croutons or "confetti" and no "candied nuts" but regular nuts OK.   No pretzels or chips.  Pickles and miniature sweet peppers are a good low carb alternative  The bread is the only source or carbohydrate that can be decreased (Joseph's makes a pita bread and a flat bread that are 50 cal and 4 net carbs ; Toufayan makes a low carb flatbread that's 100 cal and 9 net  carbs  and  Mission's carb balance whole wheat tortilla  That is 210 cal and 6 net carbs) Avoid "Low fat dressings, as well as Barry Brunner and Buhler dressings They are loaded with sugar!   3 PM/ Mid day  Snack:  Consider  1 ounce of  almonds, walnuts, pistachios, pecans, peanuts,  Macadamia nuts or a nut medley.  Avoid "granola"; the dried cranberries and raisins are loaded with carbohydrates. Mixed nuts ok if no raisins or cranberries or dried fruit.     6 PM  Dinner:    "mean and green, "  Meat/chicken/fish with a green salad, and broccoli, cauliflower, green beans, spinach, brussel sprouts or  Lima beans::       There is a low carb pasta by Dreamfield's available at [employer] that is acceptable and tastes great only 5 diestible carbs/serving.   Try Michel Angelo's chicken piccata or chicken or eggplant parm over low carb pasta.   Marjory Lies Sanchez's "Carnitas" (pulled pork, no sauce,  0 carbs) or his beef pot roast to make a dinner burrito  Whole wheat pasta is still full of digestible carbs and  Not as low in glycemic index as Dreamfield's.   Brown rice is still rice,  So skip the rice and noodles if you eat [place] or [place]  9 PM snack :   Breyer's "low carb" fudgsicle or  ice cream bar (Carb Smart line), or  Weight Watcher's ice cream bar , or another "no sugar added" ice cream;  a serving of fresh berries/cherries with whipped cream   Cheese or yogurt  Avoid bananas, pineapple, grapes  and watermelon on a regular basis because they are high in sugar)   Remember that snack Substitutions should be less than 10 carbs per serving and meals < 20 carbs. Remember to subtract fiber grams to get the "net carbs."

## 2012-04-27 NOTE — Progress Notes (Signed)
Patient ID: [name], male   DOB: [birth year], 79 y.o.   MRN: [id number]       Patient Active Problem List  Diagnosis  . Hypertension  . Hyperlipidemia LDL goal < 70  . Coronary artery occlusion without or not resulting in myocardial infarction  . Spinal stenosis of lumbar region  . Screening for colon cancer  . Neuropathy due to secondary diabetes  . Peripheral vascular disease due to secondary diabetes  . Peripheral artery disease  . Kidney disease, chronic, stage III (moderate, EGFR 30-59 ml/min)  . Diabetes mellitus with peripheral vascular disease  . Skin lesion of scalp  . Insomnia  . Edema extremities  . Obesity, unspecified    Subjective:  CC:   Chief Complaint  Patient presents with  . Follow-up    HPI:   [name] a 79 y.o. male who presents 3 month diabetes, hypertension and hyperliidemia follow up. He has No new issues.  His blood sugars are well controlled so he does not check them on a daily basis. He has lost 6 pounds by cutting back on his carbohydrates. He denies chest pain shortness of breath abdominal pain. He continues to have neuropathy affecting his feet which does not affect his sleep.   Had cardiology follow up last week;  A  TTE was done last week by paraschos.  His last functional study was a nuc med study done in narrowing of prior to his move to [place].     Past Medical History  Diagnosis Date  . H/O: malaria 18    in [place]  . Hyperlipidemia   . Hypertension   . Diabetes mellitus   . Renal insufficiency     by Nov 2012 labs, no old records available  . Peripheral artery disease     folowed by Dew post bypass 2011 [place]  . Blood transfusion   . Coronary artery disease     h/o bypass coronary- 2001 & peripheral- 2011 , last full cardiac visit  in [place] , [place], [employer]    Past Surgical History  Procedure Laterality Date  . Spine surgery    . Coronary artery bypass graft  2001    4 vessel,  done [place]   . Pacemaker insertion    . Femoral bypass  2011    done in [place],  now followed by [facility]  . Insert / replace / remove pacemaker      2004  . Lumbar laminectomy/decompression microdiscectomy  01/16/2011    Procedure: LUMBAR LAMINECTOMY/DECOMPRESSION MICRODISCECTOMY;  Surgeon: Ophelia Charter;  Location: [facility];  Service: Neurosurgery;  Laterality: N/A;  Lumbar four and lumbar five laminectomies        The following portions of the patient's history were reviewed and updated as appropriate: Allergies, current medications, and problem list.    Review of Systems:   Patient denies headache, fevers, malaise, unintentional weight loss, skin rash, eye pain, sinus congestion and sinus pain, sore throat, dysphagia,  hemoptysis , cough, dyspnea, wheezing, chest pain, palpitations, orthopnea, edema, abdominal pain, nausea, melena, diarrhea, constipation, flank pain, dysuria, hematuria, urinary  Frequency, nocturia, numbness, tingling, seizures,  Focal weakness, Loss of consciousness,  Tremor, insomnia, depression, anxiety, and suicidal ideation.       History   Social History  . Marital Status: Married    Spouse Name: N/A    Number of Children: N/A  . Years of Education: N/A   Occupational History  .  Not on file.   Social History Main Topics  . Smoking status: Former Smoker    Quit date: 09/27/1985  . Smokeless tobacco: Never Used  . Alcohol Use: No  . Drug Use: No  . Sexually Active: Not on file   Other Topics Concern  . Not on file   Social History Narrative  . No narrative on file    Objective:  BP 128/64  Pulse 78  Temp(Src) 97.3 F (36.3 C) (Oral)  Resp 16  Wt 187 lb (84.823 kg)  BMI 28.84 kg/m2  SpO2 95%  General appearance: alert, cooperative and appears stated age Ears: normal TM's and external ear canals both ears Throat: lips, mucosa, and tongue normal; teeth and gums normal Neck: no adenopathy, no carotid bruit, supple,  symmetrical, trachea midline and thyroid not enlarged, symmetric, no tenderness/mass/nodules Back: symmetric, no curvature. ROM normal. No CVA tenderness. Lungs: clear to auscultation bilaterally Heart: regular rate and rhythm, S1, S2 normal, no murmur, click, rub or gallop Abdomen: soft, non-tender; bowel sounds normal; no masses,  no organomegaly Pulses: 2+ and symmetric Skin: Skin color, texture, turgor normal. No rashes or lesions Lymph nodes: Cervical, supraclavicular, and axillary nodes normal. Foot exam:  Nails are well trimmed,  No callouses,  Sensation not intact to microfilament  Assessment and Plan:  Hypertension Well controlled on current regimen. Renal function stable, no changes today.  Hyperlipidemia LDL goal < 70 LDL and triglycerides are at goal on current medications. He has no side effects and liver enzymes are normal. No changes today   Kidney disease, chronic, stage III (moderate, EGFR 30-59 ml/min) Renal function is stable.   Obesity, unspecified Addressed with low carb diet. His lost 6 pounds in the last 3 months and his BMI is now below 30. Encouragement given  Diabetes mellitus with peripheral vascular disease Well-controlled on current regimen of -70/30 insulin twice daily. A1c is below 7.0. No changes today.  Foot exam was done. He is on appropriate medications. He is up-to-date on his eye exams.   Updated Medication List Outpatient Encounter Prescriptions as of 04/27/2012  Medication Sig Dispense Refill  . amLODipine (NORVASC) 10 MG tablet Take 1 tablet (10 mg total) by mouth daily.  90 tablet  3  . aspirin 325 MG tablet Take 325 mg by mouth daily.        Marland Kitchen atorvastatin (LIPITOR) 20 MG tablet Take 1 tablet (20 mg total) by mouth daily.  90 tablet  3  . Calcium Carbonate-Vitamin D (CALCIUM + D) 600-200 MG-UNIT TABS Take 1 tablet by mouth 2 (two) times daily.        . clopidogrel (PLAVIX) 75 MG tablet Take 1 tablet (75 mg total) by mouth daily.  90 tablet   3  . fenofibrate (TRICOR) 48 MG tablet Take 1 tablet (48 mg total) by mouth daily.  90 tablet  3  . furosemide (LASIX) 20 MG tablet One tablet  once or twice weekly as needed for fluid retention  30 tablet  3  . gabapentin (NEURONTIN) 100 MG capsule 1 to 3 capsules at bedtime as needed for tingling/numbness  90 capsule  3  . glucose blood (ACCU-CHEK AVIVA PLUS) test strip Check blood sugar 4 times a day  400 each  3  . insulin aspart protamine-insulin aspart (NOVOLOG 70/30) (70-30) 100 UNIT/ML injection Inject 18 Units into the skin 3 (three) times daily before meals. 18 units three times a day  Means he need  7.5 pens per month or  22 pens per 90 day period  72 mL  3  . Insulin Syringe-Needle U-100 (B-D INS SYRINGE 0.5CC/31GX5/16) 31G X 5/16" 0.5 ML MISC 1 application by Does not apply route 3 (three) times daily before meals.  300 each  3  . metoprolol (LOPRESSOR) 50 MG tablet Take 1 tablet (50 mg total) by mouth 2 (two) times daily.  180 tablet  1  . valsartan (DIOVAN) 160 MG tablet Take 1 tablet (160 mg total) by mouth daily.  90 tablet  3  . travoprost, benzalkonium, (TRAVATAN) 0.004 % ophthalmic solution Place 1 drop into both eyes at bedtime.        No facility-administered encounter medications on file as of 04/27/2012.     Orders Placed This Encounter  Procedures  . HM DIABETES EYE EXAM  . HM DIABETES FOOT EXAM    No Follow-up on file.

## 2012-04-28 ENCOUNTER — Encounter: Payer: Self-pay | Admitting: Internal Medicine

## 2012-04-28 NOTE — Assessment & Plan Note (Signed)
Addressed with low carb diet. His lost 6 pounds in the last 3 months and his BMI is now below 30. Encouragement given

## 2012-04-28 NOTE — Assessment & Plan Note (Addendum)
Well-controlled on current regimen of -70/30 insulin twice daily. A1c is below 7.0. No changes today.  Foot exam was done. He is on appropriate medications. He is up-to-date on his eye exams.

## 2012-04-28 NOTE — Assessment & Plan Note (Signed)
Well controlled on current regimen. Renal function stable, no changes today. 

## 2012-04-28 NOTE — Assessment & Plan Note (Signed)
Renal function is stable.

## 2012-04-28 NOTE — Assessment & Plan Note (Signed)
LDL and triglycerides are at goal on current medications. He has no side effects and liver enzymes are normal. No changes today

## 2012-05-01 ENCOUNTER — Other Ambulatory Visit: Payer: Self-pay | Admitting: Internal Medicine

## 2012-05-01 NOTE — Telephone Encounter (Signed)
Dr Derrel Nip, please help me determine how many ml's patient needs for a 3 month supply of pens.  He uses 54 units a day.  Thanks.

## 2012-05-04 ENCOUNTER — Other Ambulatory Visit: Payer: Self-pay | Admitting: General Practice

## 2012-05-04 MED ORDER — INSULIN ASPART PROT & ASPART (70-30 MIX) 100 UNIT/ML ~~LOC~~ SUSP: 18.0000 [IU] | Freq: Three times a day (TID) | SUBCUTANEOUS | Status: DC

## 2012-05-04 NOTE — Telephone Encounter (Signed)
Pt insulin filled for [facility] days. [facility] was [facility].

## 2012-05-27 ENCOUNTER — Other Ambulatory Visit: Payer: Self-pay | Admitting: *Deleted

## 2012-05-27 MED ORDER — AMLODIPINE BESYLATE 10 MG PO TABS: 10.0000 mg | ORAL_TABLET | Freq: Every day | ORAL | Status: DC

## 2012-05-27 NOTE — Telephone Encounter (Signed)
Pharmacy called stating patient no longer wants to use mail order pharmacy, [facility]. Okayed by Dr. Derrel Nip

## 2012-06-11 DIAGNOSIS — I495 Sick sinus syndrome: Secondary | ICD-10-CM | POA: Diagnosis not present

## 2012-06-24 ENCOUNTER — Other Ambulatory Visit: Payer: Self-pay | Admitting: *Deleted

## 2012-06-24 MED ORDER — AMLODIPINE BESYLATE 10 MG PO TABS: 10.0000 mg | ORAL_TABLET | Freq: Every day | ORAL | Status: DC

## 2012-06-24 NOTE — Telephone Encounter (Signed)
Pharmacy called stating patient stated he always get a 90 day supply not sure why only 30 was sent. New prescription sent to pharmacy

## 2012-07-21 ENCOUNTER — Encounter: Payer: Self-pay | Admitting: Internal Medicine

## 2012-07-22 DIAGNOSIS — H40009 Preglaucoma, unspecified, unspecified eye: Secondary | ICD-10-CM | POA: Diagnosis not present

## 2012-07-28 ENCOUNTER — Ambulatory Visit (INDEPENDENT_AMBULATORY_CARE_PROVIDER_SITE_OTHER): Payer: Medicare Other | Admitting: Internal Medicine

## 2012-07-28 ENCOUNTER — Encounter: Payer: Self-pay | Admitting: Internal Medicine

## 2012-07-28 VITALS — BP 148/60 | HR 88 | Temp 97.8°F | Resp 14 | Wt 191.8 lb

## 2012-07-28 DIAGNOSIS — E1129 Type 2 diabetes mellitus with other diabetic kidney complication: Secondary | ICD-10-CM

## 2012-07-28 DIAGNOSIS — Z0000 Encounter for general adult medical examination without abnormal findings: Secondary | ICD-10-CM | POA: Diagnosis not present

## 2012-07-28 DIAGNOSIS — E785 Hyperlipidemia, unspecified: Secondary | ICD-10-CM

## 2012-07-28 DIAGNOSIS — Z7189 Other specified counseling: Secondary | ICD-10-CM | POA: Insufficient documentation

## 2012-07-28 DIAGNOSIS — E1151 Type 2 diabetes mellitus with diabetic peripheral angiopathy without gangrene: Secondary | ICD-10-CM

## 2012-07-28 DIAGNOSIS — R350 Frequency of micturition: Secondary | ICD-10-CM

## 2012-07-28 DIAGNOSIS — E1029 Type 1 diabetes mellitus with other diabetic kidney complication: Secondary | ICD-10-CM | POA: Diagnosis not present

## 2012-07-28 DIAGNOSIS — I798 Other disorders of arteries, arterioles and capillaries in diseases classified elsewhere: Secondary | ICD-10-CM

## 2012-07-28 DIAGNOSIS — Z6825 Body mass index (BMI) 25.0-25.9, adult: Secondary | ICD-10-CM

## 2012-07-28 DIAGNOSIS — E1351 Other specified diabetes mellitus with diabetic peripheral angiopathy without gangrene: Secondary | ICD-10-CM

## 2012-07-28 DIAGNOSIS — E1159 Type 2 diabetes mellitus with other circulatory complications: Secondary | ICD-10-CM

## 2012-07-28 DIAGNOSIS — E663 Overweight: Secondary | ICD-10-CM

## 2012-07-28 DIAGNOSIS — E1121 Type 2 diabetes mellitus with diabetic nephropathy: Secondary | ICD-10-CM

## 2012-07-28 DIAGNOSIS — N058 Unspecified nephritic syndrome with other morphologic changes: Secondary | ICD-10-CM

## 2012-07-28 LAB — COMPREHENSIVE METABOLIC PANEL
ALT: 14 U/L (ref 0–53)
Alkaline Phosphatase: 62 U/L (ref 39–117)
Sodium: 142 mEq/L (ref 135–145)
Total Bilirubin: 0.5 mg/dL (ref 0.3–1.2)
Total Protein: 7.1 g/dL (ref 6.0–8.3)

## 2012-07-28 LAB — MICROALBUMIN / CREATININE URINE RATIO: Microalb Creat Ratio: 3.4 mg/g (ref 0.0–30.0)

## 2012-07-28 MED ORDER — ZOSTER VACCINE LIVE 19400 UNT/0.65ML ~~LOC~~ SOLR: 0.6500 mL | Freq: Once | SUBCUTANEOUS | Status: DC

## 2012-07-28 MED ORDER — TETANUS-DIPHTH-ACELL PERTUSSIS 5-2.5-18.5 LF-MCG/0.5 IM SUSP: 0.5000 mL | Freq: Once | INTRAMUSCULAR | Status: DC

## 2012-07-28 NOTE — Assessment & Plan Note (Signed)
Sess dew again in august for 6 month follow up on LE PAD.Marland Kitchen  Foot exam was normal excep for diminished pulses.no symptoms except numbvness forem spinal stunoes,,

## 2012-07-28 NOTE — Progress Notes (Signed)
Patient ID: [name], male   DOB: [birth year], 79 y.o.   MRN: [id number]  The patient is here for his annual Medicare wellness examination and management of other chronic and acute problems.   The risk factors are reflected in the social history.  The roster of all physicians providing medical care to patient - is listed in the Snapshot section of the chart.  Activities of daily living:  The patient is 100% independent in all ADLs: dressing, toileting, feeding as well as independent mobility  Home safety : The patient has smoke detectors in the home. They wear seatbelts.  There are no firearms at home. There is no violence in the home.   There is no risks for hepatitis, STDs or HIV. There is no   history of blood transfusion. They have no travel history to infectious disease endemic areas of the world.  The patient has seen their dentist in the last six month. They have seen their eye doctor in the last year. They admit to slight hearing difficulty with regard to whispered voices and some television programs.  They have deferred audiologic testing in the last year.  They do not  have excessive sun exposure. Discussed the need for sun protection: hats, long sleeves and use of sunscreen if there is significant sun exposure.   Diet: the importance of a healthy diet is discussed. They do have a healthy diet.  The benefits of regular aerobic exercise were discussed. She walks 4 times per week ,  20 minutes.   Depression screen: there are no signs or vegative symptoms of depression- irritability, change in appetite, anhedonia, sadness/tearfullness.  Cognitive assessment: the patient manages all their financial and personal affairs and is actively engaged. They could relate day,date,year and events; recalled 2/3 objects at 3 minutes; performed clock-face test normally.  The following portions of the patient's history were reviewed and updated as appropriate: allergies, current medications, past  family history, past medical history,  past surgical history, past social history  and problem list.  Visual acuity was not assessed per patient preference since she has regular follow up with her ophthalmologist. Hearing and body mass index were assessed and reviewed.   During the course of the visit the patient was educated and counseled about appropriate screening and preventive services including : fall prevention , diabetes screening, nutrition counseling, colorectal cancer screening, and recommended immunizations.    Objective:  BP 148/60  Pulse 88  Temp(Src) 97.8 F (36.6 C) (Oral)  Resp 14  Wt 191 lb 12 oz (86.977 kg)  BMI 29.57 kg/m2  SpO2 98%  .BP 148/60  Pulse 88  Temp(Src) 97.8 F (36.6 C) (Oral)  Resp 14  Wt 191 lb 12 oz (86.977 kg)  BMI 29.57 kg/m2  SpO2 98%  General Appearance:    Alert, cooperative, no distress, appears stated age  Head:    Normocephalic, without obvious abnormality, atraumatic  Eyes:    PERRL, conjunctiva/corneas clear, EOM's intact, fundi    benign, both eyes       Ears:    Normal TM's and external ear canals, both ears  Nose:   Nares normal, septum midline, mucosa normal, no drainage   or sinus tenderness  Throat:   Lips, mucosa, and tongue normal; teeth and gums normal  Neck:   Supple, symmetrical, trachea midline, no adenopathy;       thyroid:  No enlargement/tenderness/nodules; no carotid   bruit or JVD  Back:     Symmetric, no curvature,  ROM normal, no CVA tenderness  Lungs:     Clear to auscultation bilaterally, respirations unlabored  Chest wall:    No tenderness or deformity  Heart:    Regular rate and rhythm, S1 and S2 normal, no murmur, rub   or gallop  Abdomen:     Soft, non-tender, bowel sounds active all four quadrants,    no masses, no organomegaly  Extremities:   Extremities normal, atraumatic, no cyanosis or edema  Pulses:   2+ and symmetric all extremities  Skin:   Skin color, texture, turgor normal, no rashes or  lesions  Lymph nodes:   Cervical, supraclavicular, and axillary nodes normal  Neurologic:   CNII-XII intact. Normal strength, sensation and reflexes      throughout   Assessment and Plan  Peripheral vascular disease due to secondary diabetes Sess dew again in august for 6 month follow up on LE PAD.Marland Kitchen  Foot exam was normal excep for diminished pulses.no symptoms except numbvness forem spinal stunoes,,    Diabetes mellitus with peripheral vascular disease Historically well-controlled on current medications.  hemoglobin A1c has been consistently less than 7.0 .He has had occasional lows in mid morning  And has been adjusting his insulin.  Suggested that he reduce his morning dose by 5  Units  He is up-to-date on eye exams and his foot exam is unchanged from prior. we'll repeat his cholesterol  at next visit. He is on the appropriate medications.  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 He is on the appropriate medications.     Hyperlipidemia LDL goal < 70 LDL and triglycerides are well under goal on current medications. He has no side effects and liver enzymes are normal. No changes today     Overweight (BMI 25.0-29.9) BMI is stable at 29.  I have addressed  BMI and encouraged a  low glycemic index diet utilizing smaller more frequent meals to increase metabolism.    Routine general medical examination at a health care facility Annual wellness  exam was done excluding testicular and prostate exam due to age and lack of urinary symptoms. Colon ca screening was addressed with home stool test    Updated Medication List Outpatient Encounter Prescriptions as of 07/28/2012  Medication Sig Dispense Refill  . amLODipine (NORVASC) 10 MG tablet Take 1 tablet (10 mg total) by mouth daily.  90 tablet  1  . aspirin 325 MG tablet Take 325 mg by mouth daily.        Marland Kitchen atorvastatin (LIPITOR) 20 MG tablet Take 1 tablet (20 mg total) by mouth daily.  90  tablet  3  . Calcium Carbonate-Vitamin D (CALCIUM + D) 600-200 MG-UNIT TABS Take 1 tablet by mouth 2 (two) times daily.        . clopidogrel (PLAVIX) 75 MG tablet Take 1 tablet (75 mg total) by mouth daily.  90 tablet  3  . fenofibrate (TRICOR) 48 MG tablet Take 1 tablet (48 mg total) by mouth daily.  90 tablet  3  . furosemide (LASIX) 20 MG tablet One tablet  once or twice weekly as needed for fluid retention  30 tablet  3  . glucose blood (ACCU-CHEK AVIVA PLUS) test strip Check blood sugar 4 times a day  400 each  3  . insulin aspart protamine-insulin aspart (NOVOLOG MIX 70/30 FLEXPEN) (70-30) 100 UNIT/ML injection Inject 18 Units into the skin 3 (three) times daily before meals.  60 mL  3  . Insulin  Syringe-Needle U-100 (B-D INS SYRINGE 0.5CC/31GX5/16) 31G X 5/16" 0.5 ML MISC 1 application by Does not apply route 3 (three) times daily before meals.  300 each  3  . metoprolol (LOPRESSOR) 50 MG tablet Take 1 tablet (50 mg total) by mouth 2 (two) times daily.  180 tablet  1  . travoprost, benzalkonium, (TRAVATAN) 0.004 % ophthalmic solution Place 1 drop into both eyes at bedtime.       . valsartan (DIOVAN) 160 MG tablet Take 1 tablet (160 mg total) by mouth daily.  90 tablet  3  . gabapentin (NEURONTIN) 100 MG capsule 1 to 3 capsules at bedtime as needed for tingling/numbness  90 capsule  3  . TDaP (BOOSTRIX) 5-2.5-18.5 LF-MCG/0.5 injection Inject 0.5 mLs into the muscle once.  0.5 mL  0  . zoster vaccine live, PF, (ZOSTAVAX) 13244 UNT/0.65ML injection Inject 19,400 Units into the skin once.  1 each  0   No facility-administered encounter medications on file as of 07/28/2012.

## 2012-07-28 NOTE — Patient Instructions (Addendum)
You had your annual Medicare wellness exam today   Please use the stool kit to send [place] back a sample to test for blood.  This is your colon CA screening test.   I recommedn that you have a TDaP vaccine and a Shingles vaccine.  I have given you prescriptions for thses because they will be cheaper at the health Dept or at your local  pharmacy because Medicare will not reimburse for them.    We will contact you with the bloodwork results

## 2012-07-29 NOTE — Assessment & Plan Note (Signed)
Renal function is stable. He is on the appropriate medications.

## 2012-07-29 NOTE — Assessment & Plan Note (Signed)
Annual wellness  exam was done excluding testicular and prostate exam due to age and lack of urinary symptoms. Colon ca screening was addressed with home stool test

## 2012-07-29 NOTE — Assessment & Plan Note (Addendum)
Historically well-controlled on current medications.  hemoglobin A1c has been consistently less than 7.0 .He has had occasional lows in mid morning  And has been adjusting his insulin.  Suggested that he reduce his morning dose by 5  Units  He is up-to-date on eye exams and his foot exam is unchanged from prior. we'll repeat his cholesterol  at next visit. He is on the appropriate medications.

## 2012-07-29 NOTE — Assessment & Plan Note (Signed)
BMI is stable at 29.  I have addressed  BMI and encouraged a  low glycemic index diet utilizing smaller more frequent meals to increase metabolism.

## 2012-07-29 NOTE — Assessment & Plan Note (Signed)
LDL and triglycerides are well under goal on current medications. He has no side effects and liver enzymes are normal. No changes today

## 2012-08-02 ENCOUNTER — Encounter: Payer: Self-pay | Admitting: Internal Medicine

## 2012-08-10 ENCOUNTER — Other Ambulatory Visit (INDEPENDENT_AMBULATORY_CARE_PROVIDER_SITE_OTHER): Payer: Medicare Other

## 2012-08-10 ENCOUNTER — Other Ambulatory Visit: Payer: Self-pay | Admitting: *Deleted

## 2012-08-10 DIAGNOSIS — Z1211 Encounter for screening for malignant neoplasm of colon: Secondary | ICD-10-CM

## 2012-08-11 ENCOUNTER — Encounter: Payer: Self-pay | Admitting: *Deleted

## 2012-08-11 MED ORDER — ZOSTER VACCINE LIVE 19400 UNT/0.65ML ~~LOC~~ SOLR: 0.6500 mL | Freq: Once | SUBCUTANEOUS | Status: DC

## 2012-08-11 MED ORDER — TETANUS-DIPHTH-ACELL PERTUSSIS 5-2.5-18.5 LF-MCG/0.5 IM SUSP: 0.5000 mL | Freq: Once | INTRAMUSCULAR | Status: DC

## 2012-08-11 NOTE — Telephone Encounter (Signed)
Pt lost immunization Rx. Faxed to pharmacy.

## 2012-08-14 DIAGNOSIS — Z23 Encounter for immunization: Secondary | ICD-10-CM | POA: Diagnosis not present

## 2012-09-01 ENCOUNTER — Other Ambulatory Visit: Payer: Self-pay | Admitting: Internal Medicine

## 2012-09-08 DIAGNOSIS — E119 Type 2 diabetes mellitus without complications: Secondary | ICD-10-CM | POA: Diagnosis not present

## 2012-09-08 DIAGNOSIS — I739 Peripheral vascular disease, unspecified: Secondary | ICD-10-CM | POA: Diagnosis not present

## 2012-09-08 DIAGNOSIS — E785 Hyperlipidemia, unspecified: Secondary | ICD-10-CM | POA: Diagnosis not present

## 2012-09-08 DIAGNOSIS — I10 Essential (primary) hypertension: Secondary | ICD-10-CM | POA: Diagnosis not present

## 2012-09-09 ENCOUNTER — Other Ambulatory Visit: Payer: Self-pay

## 2012-09-15 DIAGNOSIS — L570 Actinic keratosis: Secondary | ICD-10-CM | POA: Diagnosis not present

## 2012-09-15 DIAGNOSIS — L219 Seborrheic dermatitis, unspecified: Secondary | ICD-10-CM | POA: Diagnosis not present

## 2012-09-16 ENCOUNTER — Other Ambulatory Visit: Payer: Self-pay | Admitting: *Deleted

## 2012-09-16 MED ORDER — ATORVASTATIN CALCIUM 20 MG PO TABS: 20.0000 mg | ORAL_TABLET | Freq: Every day | ORAL | Status: DC

## 2012-09-21 ENCOUNTER — Encounter: Payer: Self-pay | Admitting: Internal Medicine

## 2012-09-29 DIAGNOSIS — R0609 Other forms of dyspnea: Secondary | ICD-10-CM | POA: Diagnosis not present

## 2012-09-29 DIAGNOSIS — I2510 Atherosclerotic heart disease of native coronary artery without angina pectoris: Secondary | ICD-10-CM | POA: Diagnosis not present

## 2012-09-29 DIAGNOSIS — E782 Mixed hyperlipidemia: Secondary | ICD-10-CM | POA: Diagnosis not present

## 2012-09-29 DIAGNOSIS — I10 Essential (primary) hypertension: Secondary | ICD-10-CM | POA: Diagnosis not present

## 2012-10-19 DIAGNOSIS — E782 Mixed hyperlipidemia: Secondary | ICD-10-CM | POA: Diagnosis not present

## 2012-10-19 DIAGNOSIS — I2510 Atherosclerotic heart disease of native coronary artery without angina pectoris: Secondary | ICD-10-CM | POA: Diagnosis not present

## 2012-10-19 DIAGNOSIS — I10 Essential (primary) hypertension: Secondary | ICD-10-CM | POA: Diagnosis not present

## 2012-10-27 DIAGNOSIS — H409 Unspecified glaucoma: Secondary | ICD-10-CM | POA: Diagnosis not present

## 2012-11-03 ENCOUNTER — Encounter: Payer: Self-pay | Admitting: Internal Medicine

## 2012-11-03 ENCOUNTER — Ambulatory Visit (INDEPENDENT_AMBULATORY_CARE_PROVIDER_SITE_OTHER): Payer: Medicare Other | Admitting: Internal Medicine

## 2012-11-03 VITALS — BP 136/60 | HR 85 | Temp 98.3°F | Resp 14 | Ht 67.5 in | Wt 193.0 lb

## 2012-11-03 DIAGNOSIS — N058 Unspecified nephritic syndrome with other morphologic changes: Secondary | ICD-10-CM | POA: Diagnosis not present

## 2012-11-03 DIAGNOSIS — E1129 Type 2 diabetes mellitus with other diabetic kidney complication: Secondary | ICD-10-CM

## 2012-11-03 DIAGNOSIS — E875 Hyperkalemia: Secondary | ICD-10-CM

## 2012-11-03 DIAGNOSIS — R609 Edema, unspecified: Secondary | ICD-10-CM | POA: Diagnosis not present

## 2012-11-03 DIAGNOSIS — Z23 Encounter for immunization: Secondary | ICD-10-CM

## 2012-11-03 DIAGNOSIS — E1149 Type 2 diabetes mellitus with other diabetic neurological complication: Secondary | ICD-10-CM | POA: Diagnosis not present

## 2012-11-03 DIAGNOSIS — I240 Acute coronary thrombosis not resulting in myocardial infarction: Secondary | ICD-10-CM

## 2012-11-03 DIAGNOSIS — E1121 Type 2 diabetes mellitus with diabetic nephropathy: Secondary | ICD-10-CM

## 2012-11-03 DIAGNOSIS — IMO0001 Reserved for inherently not codable concepts without codable children: Secondary | ICD-10-CM | POA: Diagnosis not present

## 2012-11-03 DIAGNOSIS — E1151 Type 2 diabetes mellitus with diabetic peripheral angiopathy without gangrene: Secondary | ICD-10-CM

## 2012-11-03 DIAGNOSIS — E1159 Type 2 diabetes mellitus with other circulatory complications: Secondary | ICD-10-CM | POA: Diagnosis not present

## 2012-11-03 DIAGNOSIS — I798 Other disorders of arteries, arterioles and capillaries in diseases classified elsewhere: Secondary | ICD-10-CM

## 2012-11-03 LAB — HM DIABETES FOOT EXAM

## 2012-11-03 MED ORDER — ZOSTER VACCINE LIVE 19400 UNT/0.65ML ~~LOC~~ SOLR: 0.6500 mL | Freq: Once | SUBCUTANEOUS | Status: DC

## 2012-11-03 NOTE — Assessment & Plan Note (Signed)
Chronic, managed with twice weekly Lasix. Last known EF was 57% by stress test in 2010. Records from Dr. Saralyn Pilar are not currently available.

## 2012-11-03 NOTE — Patient Instructions (Addendum)
Remember that your insulin does will overlap if you take more than 2-3 shots daily and could cuase an extremely low blood sugar   Try [employer]'s Carb Smart Ice cream Line.  It is lower in sugar:   Chocolate Vanilla fudgesicle Ice cream bar (covered in chocolate with our without almonds)   Found at [employer].  [employer] , and [employer]   Try Dannon light N fit Greek yogurt in [employer] ( [employer] and [place])

## 2012-11-03 NOTE — Assessment & Plan Note (Signed)
Well-controlled on current medications.  hemoglobin A1c has been consistently less than 7.0 . He is up-to-date on eye exams and his foot exam is done with each visit due to neuropathy. Marland Kitchen He is on the appropriate medications. I have again stressed to him the need to avoid using 70/30 insulin more than twice daily because of the risk of hypoglycemia due to overlapping peaks. He appears to understand this today.

## 2012-11-03 NOTE — Assessment & Plan Note (Signed)
He is currently asymptomatic and tolerating all medications

## 2012-11-03 NOTE — Progress Notes (Signed)
Patient ID: [name], male   DOB: 1924-11-06, 79 y.o.   MRN: [id number]  Patient Active Problem List   Diagnosis Date Noted  . Edema 11/03/2012  . Routine general medical examination at a health care facility 07/28/2012  . Overweight (BMI 25.0-29.9) 04/27/2012  . Diabetes mellitus with peripheral vascular disease 07/03/2011  . Skin lesion of scalp 07/03/2011  . Insomnia 07/03/2011  . 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 Hypertension 09/28/2010  . Hyperlipidemia LDL goal < 70 09/28/2010  . Coronary artery occlusion without or not resulting in myocardial infarction 09/28/2010  . Spinal stenosis of lumbar region 09/28/2010  . Screening for colon cancer 09/28/2010  . Neuropathy due to secondary diabetes 09/28/2010  . Peripheral vascular disease due to secondary diabetes 09/28/2010    Subjective:  CC:   No chief complaint on file.   HPI:   [name] a 79 y.o. male who presents  Past Medical History  Diagnosis Date  . H/O: malaria 28    in [place]  . Hyperlipidemia   . Hypertension   . Diabetes mellitus   . Renal insufficiency     by Nov 2012 labs, no old records available  . Peripheral artery disease     folowed by Dew post bypass 2011 [place]  . Blood transfusion   . Coronary artery disease     h/o bypass coronary- 2001 & peripheral- 2011 , last full cardiac visit  in [place] , [place], [employer]    Past Surgical History  Procedure Laterality Date  . Spine surgery    . Coronary artery bypass graft  2001    4 vessel, done in [place]   . Pacemaker insertion    . Femoral bypass  2011    done in [place],  now followed by [facility]  . Insert / replace / remove pacemaker      2004  . Lumbar laminectomy/decompression microdiscectomy  01/16/2011    Procedure: LUMBAR LAMINECTOMY/DECOMPRESSION MICRODISCECTOMY;  Surgeon: Ophelia Charter;  Location: [facility];  Service: Neurosurgery;  Laterality: N/A;  Lumbar  four and lumbar five laminectomies        The following portions of the patient's history were reviewed and updated as appropriate: Allergies, current medications, and problem list.    Review of Systems:   12 Pt  review of systems was negative except those addressed in the HPI,     History   Social History  . Marital Status: Married    Spouse Name: N/A    Number of Children: N/A  . Years of Education: N/A   Occupational History  . Not on file.   Social History Main Topics  . Smoking status: Former Smoker    Quit date: 09/27/1985  . Smokeless tobacco: Never Used  . Alcohol Use: No  . Drug Use: No  . Sexual Activity: Not on file   Other Topics Concern  . Not on file   Social History Narrative  . No narrative on file    Objective:  Filed Vitals:   11/03/12 1439  BP: 136/60  Pulse: 85  Temp: 98.3 F (36.8 C)  Resp: 14     General appearance: alert, cooperative and appears stated age Ears: normal TM's and external ear canals both ears Throat: lips, mucosa, and tongue normal; teeth and gums normal Neck: no adenopathy, no carotid bruit, supple, symmetrical, trachea midline and thyroid not enlarged, symmetric, no tenderness/mass/nodules Back: symmetric, no curvature. ROM  normal. No CVA tenderness. Lungs: clear to auscultation bilaterally Heart: regular rate and rhythm, S1, S2 normal, no murmur, click, rub or gallop Abdomen: soft, non-tender; bowel sounds normal; no masses,  no organomegaly Pulses: 2+ and symmetric Skin: Skin color, texture, turgor normal. No rashes or lesions Lymph nodes: Cervical, supraclavicular, and axillary nodes normal. Foot: Decreased sensation to light touch bilaterally, skin intact, nails well trimmed    Assessment and Plan:  Diabetes mellitus with peripheral vascular disease Well-controlled on current medications.  hemoglobin A1c has been consistently less than 7.0 . He is up-to-date on eye exams and his foot exam is done with  each visit due to neuropathy. Marland Kitchen He is on the appropriate medications. I have again stressed to him the need to avoid using 70/30 insulin more than twice daily because of the risk of hypoglycemia due to overlapping peaks. He appears to understand this today.   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 He does not desire nephrology followup. His diabetes is well-controlled and he is on angiotensin receptor blocker ,statin, and aspirin  Edema Chronic, managed with twice weekly Lasix. Last known EF was 57% by stress test in 2010. Records from Dr. Saralyn Pilar are not currently available.   Updated Medication List Outpatient Encounter Prescriptions as of 11/03/2012  Medication Sig Dispense Refill  . amLODipine (NORVASC) 10 MG tablet Take 1 tablet (10 mg total) by mouth daily.  90 tablet  1  . aspirin 325 MG tablet Take 325 mg by mouth daily.        Marland Kitchen atorvastatin (LIPITOR) 20 MG tablet Take 1 tablet (20 mg total) by mouth daily.  90 tablet  1  . Calcium Carbonate-Vitamin D (CALCIUM + D) 600-200 MG-UNIT TABS Take 1 tablet by mouth 2 (two) times daily.        . clopidogrel (PLAVIX) 75 MG tablet Take 1 tablet (75 mg total) by mouth daily.  90 tablet  3  . fenofibrate (TRICOR) 48 MG tablet Take 1 tablet (48 mg total) by mouth daily.  90 tablet  3  . furosemide (LASIX) 20 MG tablet One tablet  once or twice weekly as needed for fluid retention  30 tablet  3  . glucose blood (ACCU-CHEK AVIVA PLUS) test strip Check blood sugar 4 times a day  400 each  3  . insulin aspart protamine-insulin aspart (NOVOLOG MIX 70/30 FLEXPEN) (70-30) 100 UNIT/ML injection Inject 18 Units into the skin 3 (three) times daily before meals.  60 mL  3  . Insulin Syringe-Needle U-100 (B-D INS SYRINGE 0.5CC/31GX5/16) 31G X 5/16" 0.5 ML MISC 1 application  by Does not apply route 3 (three) times daily before meals.  300 each  3  . metoprolol (LOPRESSOR) 50 MG tablet TAKE 1 TABLET BY MOUTH TWICE A DAY  180 tablet  1  . TDaP (BOOSTRIX) 5-2.5-18.5 LF-MCG/0.5 injection Inject 0.5 mLs into the muscle once.  0.5 mL  0  . travoprost, benzalkonium, (TRAVATAN) 0.004 % ophthalmic solution Place 1 drop into both eyes at bedtime.       . valsartan (DIOVAN) 160 MG tablet Take 1 tablet (160 mg total) by mouth daily.  90 tablet  3  . zoster vaccine live, PF, (ZOSTAVAX) 60454 UNT/0.65ML injection Inject 19,400 Units into the skin once.  1 each  0  . gabapentin (NEURONTIN) 100 MG capsule 1 to  3 capsules at bedtime as needed for tingling/numbness  90 capsule  3  . zoster vaccine live, PF, (ZOSTAVAX) 57846 UNT/0.65ML injection Inject 19,400 Units into the skin once.  1 each  0   No facility-administered encounter medications on file as of 11/03/2012.     Orders Placed This Encounter  Procedures  . Flu Vaccine QUAD 36+ mos PF IM (Fluarix)  . Hemoglobin A1c  . LDL cholesterol, direct  . Comprehensive metabolic panel    No Follow-up on file.

## 2012-11-03 NOTE — Assessment & Plan Note (Signed)
His creatinine is stable. He does not desire nephrology followup. His diabetes is well-controlled and he is on angiotensin receptor blocker ,statin, and aspirin

## 2012-11-04 LAB — COMPREHENSIVE METABOLIC PANEL
Albumin: 4 g/dL (ref 3.5–5.2)
BUN: 30 mg/dL — ABNORMAL HIGH (ref 6–23)
Calcium: 9.2 mg/dL (ref 8.4–10.5)
Chloride: 110 mEq/L (ref 96–112)
Creatinine, Ser: 2 mg/dL — ABNORMAL HIGH (ref 0.4–1.5)
Glucose, Bld: 179 mg/dL — ABNORMAL HIGH (ref 70–99)
Potassium: 5.3 mEq/L — ABNORMAL HIGH (ref 3.5–5.1)

## 2012-11-05 NOTE — Addendum Note (Signed)
Addended by: Crecencio Mc on: 11/05/2012 01:41 PM   Modules accepted: Orders

## 2012-11-06 ENCOUNTER — Encounter: Payer: Self-pay | Admitting: Internal Medicine

## 2012-11-11 ENCOUNTER — Other Ambulatory Visit (INDEPENDENT_AMBULATORY_CARE_PROVIDER_SITE_OTHER): Payer: Medicare Other

## 2012-11-11 DIAGNOSIS — E875 Hyperkalemia: Secondary | ICD-10-CM

## 2012-11-11 LAB — BASIC METABOLIC PANEL
Calcium: 9.4 mg/dL (ref 8.4–10.5)
GFR: 35.03 mL/min — ABNORMAL LOW (ref >60.00)
Potassium: 4.9 mEq/L (ref 3.5–5.1)
Sodium: 145 mEq/L (ref 135–145)

## 2012-11-12 ENCOUNTER — Encounter: Payer: Self-pay | Admitting: Internal Medicine

## 2012-12-11 ENCOUNTER — Other Ambulatory Visit: Payer: Self-pay | Admitting: Internal Medicine

## 2012-12-15 DIAGNOSIS — I495 Sick sinus syndrome: Secondary | ICD-10-CM | POA: Diagnosis not present

## 2012-12-22 ENCOUNTER — Other Ambulatory Visit: Payer: Self-pay | Admitting: *Deleted

## 2012-12-22 MED ORDER — FUROSEMIDE 20 MG PO TABS: ORAL_TABLET | ORAL | Status: DC

## 2013-01-27 ENCOUNTER — Other Ambulatory Visit: Payer: Self-pay | Admitting: Internal Medicine

## 2013-02-02 ENCOUNTER — Telehealth: Payer: Self-pay | Admitting: *Deleted

## 2013-02-02 ENCOUNTER — Encounter: Payer: Self-pay | Admitting: Internal Medicine

## 2013-02-02 ENCOUNTER — Ambulatory Visit (INDEPENDENT_AMBULATORY_CARE_PROVIDER_SITE_OTHER): Payer: Medicare Other | Admitting: Internal Medicine

## 2013-02-02 VITALS — BP 130/60 | HR 66 | Temp 97.8°F | Resp 12 | Wt 191.0 lb

## 2013-02-02 DIAGNOSIS — I10 Essential (primary) hypertension: Secondary | ICD-10-CM

## 2013-02-02 DIAGNOSIS — E119 Type 2 diabetes mellitus without complications: Secondary | ICD-10-CM | POA: Diagnosis not present

## 2013-02-02 DIAGNOSIS — E1159 Type 2 diabetes mellitus with other circulatory complications: Secondary | ICD-10-CM | POA: Diagnosis not present

## 2013-02-02 DIAGNOSIS — E785 Hyperlipidemia, unspecified: Secondary | ICD-10-CM | POA: Diagnosis not present

## 2013-02-02 DIAGNOSIS — E1121 Type 2 diabetes mellitus with diabetic nephropathy: Secondary | ICD-10-CM

## 2013-02-02 DIAGNOSIS — Z6825 Body mass index (BMI) 25.0-25.9, adult: Secondary | ICD-10-CM

## 2013-02-02 DIAGNOSIS — I240 Acute coronary thrombosis not resulting in myocardial infarction: Secondary | ICD-10-CM

## 2013-02-02 DIAGNOSIS — E1151 Type 2 diabetes mellitus with diabetic peripheral angiopathy without gangrene: Secondary | ICD-10-CM

## 2013-02-02 DIAGNOSIS — IMO0001 Reserved for inherently not codable concepts without codable children: Secondary | ICD-10-CM

## 2013-02-02 DIAGNOSIS — E663 Overweight: Secondary | ICD-10-CM

## 2013-02-02 DIAGNOSIS — E1129 Type 2 diabetes mellitus with other diabetic kidney complication: Secondary | ICD-10-CM

## 2013-02-02 DIAGNOSIS — N058 Unspecified nephritic syndrome with other morphologic changes: Secondary | ICD-10-CM

## 2013-02-02 DIAGNOSIS — I798 Other disorders of arteries, arterioles and capillaries in diseases classified elsewhere: Secondary | ICD-10-CM

## 2013-02-02 LAB — COMPREHENSIVE METABOLIC PANEL
Albumin: 4.3 g/dL (ref 3.5–5.2)
CO2: 25 mEq/L (ref 19–32)
Calcium: 9.2 mg/dL (ref 8.4–10.5)
Chloride: 111 mEq/L (ref 96–112)
Creatinine, Ser: 2 mg/dL — ABNORMAL HIGH (ref 0.4–1.5)
GFR: 33.23 mL/min — ABNORMAL LOW (ref >60.00)
Glucose, Bld: 45 mg/dL — CL (ref 70–99)
Sodium: 141 mEq/L (ref 135–145)
Total Bilirubin: 0.6 mg/dL (ref 0.3–1.2)
Total Protein: 6.9 g/dL (ref 6.0–8.3)

## 2013-02-02 LAB — HM DIABETES EYE EXAM

## 2013-02-02 NOTE — Telephone Encounter (Signed)
CRITICAL GLUCOSE  45  THIS WAS DRAWN TODAY AT 233

## 2013-02-02 NOTE — Progress Notes (Signed)
Patient ID: [name], male   DOB: [birth year], 79 y.o.   MRN: [id number]   Patient Active Problem List   Diagnosis Date Noted  . Edema 11/03/2012  . Routine general medical examination at a health care facility 07/28/2012  . Overweight (BMI 25.0-29.9) 04/27/2012  . Diabetes mellitus with peripheral vascular disease 07/03/2011  . Skin lesion of scalp 07/03/2011  . Insomnia 07/03/2011  . 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 Hypertension 09/28/2010  . Hyperlipidemia LDL goal < 70 09/28/2010  . Coronary artery occlusion without or not resulting in myocardial infarction 09/28/2010  . Spinal stenosis of lumbar region 09/28/2010  . Screening for colon cancer 09/28/2010  . Neuropathy due to secondary diabetes 09/28/2010  . Peripheral vascular disease due to secondary diabetes 09/28/2010    Subjective:  CC:   Chief Complaint  Patient presents with  . Follow-up    3 months    HPI:   [name] a 79 y.o. male who presents for 3 month follow up on DM type 2, hyperlipidemia and hypertension.  He is not feeling well currently and requesting orange juice for low blood sugar.   cbg 58 .  Occurred because he skipped lunch and took 18 units of 70/30 this morning, his usual dose.  continues to use 70/30 three times daily despite my warnings to reduce to bid. Has had rare hypoglycemic events in the recent past .     Past Medical History  Diagnosis Date  . H/O: malaria 56    in [place]  . Hyperlipidemia   . Hypertension   . Diabetes mellitus   . Renal insufficiency     by Nov 2012 labs, no old records available  . Peripheral artery disease     folowed by Dew post bypass 2011 [place]  . Blood transfusion   . Coronary artery disease     h/o bypass coronary- 2001 & peripheral- 2011 , last full cardiac visit  in [employer] , [place], [employer]    Past Surgical History  Procedure Laterality Date  . Spine surgery    . Coronary  artery bypass graft  2001    4 vessel, done in [place]   . Pacemaker insertion    . Femoral bypass  2011    done in [place],  now followed by Leotis Pain  . Insert / replace / remove pacemaker      2004  . Lumbar laminectomy/decompression microdiscectomy  01/16/2011    Procedure: LUMBAR LAMINECTOMY/DECOMPRESSION MICRODISCECTOMY;  Surgeon: Ophelia Charter;  Location: [facility];  Service: Neurosurgery;  Laterality: N/A;  Lumbar four and lumbar five laminectomies        The following portions of the patient's history were reviewed and updated as appropriate: Allergies, current medications, and problem list.    Review of Systems:   12 Pt  review of systems was negative except those addressed in the HPI,     History   Social History  . Marital Status: Married    Spouse Name: N/A    Number of Children: N/A  . Years of Education: N/A   Occupational History  . Not on file.   Social History Main Topics  . Smoking status: Former Smoker    Quit date: 09/27/1985  . Smokeless tobacco: Never Used  . Alcohol Use: No  . Drug Use: No  . Sexual Activity: Not on file   Other Topics Concern  . Not on file  Social History Narrative  . No narrative on file    Objective:  Filed Vitals:   02/02/13 1351  BP: 130/60  Pulse: 66  Temp: 97.8 F (36.6 C)  Resp: 12     General appearance: alert, cooperative and appears stated age Ears: normal TM's and external ear canals both ears Throat: lips, mucosa, and tongue normal; teeth and gums normal Neck: no adenopathy, no carotid bruit, supple, symmetrical, trachea midline and thyroid not enlarged, symmetric, no tenderness/mass/nodules Back: symmetric, no curvature. ROM normal. No CVA tenderness. Lungs: clear to auscultation bilaterally Heart: regular rate and rhythm, S1, S2 normal, no murmur, click, rub or gallop Abdomen: soft, non-tender; bowel sounds normal; no masses,  no organomegaly Pulses: 2+ and symmetric Skin: Skin color,  texture, turgor normal. No rashes or lesions Lymph nodes: Cervical, supraclavicular, and axillary nodes normal.  Assessment and Plan:  Diabetes mellitus with peripheral vascular disease Having recurrent hypoglycemia due to continued overuse of 70/30 insulin tid.  Reducing am dose to 15 units and advised to omit lunchtime dose and not skip meals.  His repeat blood sugar was > 80 prior to leaving the office today.    hemoglobin A1c has been consistently less than 7.0 . He is up-to-date on eye exams and his foot exam is abnormal due to neuropathy and PAD.  e'll repeat his urine microalbumin to creatinine ratio at next visit. He is on the appropriate medications.  Lab Results  Component Value Date   HGBA1C 6.3 02/02/2013   Lab Results  Component Value Date   MICROALBUR 7.1* 07/28/2012    Coronary artery occlusion without or not resulting in myocardial infarction He is currently asymptomatic and tolerating all medications  Lab Results  Component Value Date   CHOL 100 04/20/2012   HDL 30.80* 04/20/2012   LDLCALC 48 04/20/2012   LDLDIRECT 58.7 11/03/2012   TRIG 106.0 04/20/2012   CHOLHDL 3 04/20/2012    Hyperlipidemia LDL goal < 70 He is on the apprpriate therapy based on current ACC guidelines.   Liver enzymes are normal , no changes today.  Lab Results  Component Value Date   ALT 17 02/02/2013   AST 18 02/02/2013   ALKPHOS 55 02/02/2013   BILITOT 0.6 02/02/2013    Hypertension Well controlled on current regimen. Renal function stable, no changes today.  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  I have addressed  BMI and recommended a low glycemic index diet utilizing smaller more frequent meals to increase metabolism.  He is  exercising with a goal of 30 minutes of aerobic exercise a minimum of 5 days per week.    Updated Medication List Outpatient Encounter Prescriptions as of 02/02/2013  Medication Sig  . amLODipine (NORVASC) 10 MG tablet TAKE 1 TABLET (10 MG TOTAL) BY MOUTH DAILY.  Marland Kitchen aspirin 325 MG tablet Take 325 mg by mouth daily.    Marland Kitchen atorvastatin (LIPITOR) 20 MG tablet Take 1 tablet (20 mg total) by mouth daily.  . Calcium Carbonate-Vitamin D (CALCIUM + D) 600-200 MG-UNIT  TABS Take 1 tablet by mouth 2 (two) times daily.    . clopidogrel (PLAVIX) 75 MG tablet Take 1 tablet (75 mg total) by mouth daily.  . fenofibrate (TRICOR) 48 MG tablet Take 1 tablet (48 mg total) by mouth daily.  . furosemide (LASIX) 20 MG tablet One tablet  once or twice weekly as needed for fluid retention  . gabapentin (NEURONTIN) 100 MG capsule 1 to 3 capsules at bedtime as needed for tingling/numbness  . glucose blood (ACCU-CHEK AVIVA PLUS) test strip Check blood sugar 4 times daily Dx 250.70  . insulin aspart protamine-insulin aspart (NOVOLOG MIX 70/30 FLEXPEN) (70-30) 100 UNIT/ML injection Inject 18 Units into the skin 3 (three) times daily before meals.  . Insulin Syringe-Needle U-100 (B-D INS SYRINGE 0.5CC/31GX5/16) 31G X 5/16" 0.5 ML MISC 1 application by Does not apply route 3 (three) times daily before meals.  . metoprolol (LOPRESSOR) 50 MG tablet TAKE 1 TABLET BY MOUTH TWICE A DAY  . travoprost, benzalkonium, (TRAVATAN) 0.004 % ophthalmic solution Place 1 drop into both eyes at bedtime.   . valsartan (DIOVAN) 160 MG tablet Take 1 tablet (160 mg total) by mouth daily.  . TDaP (BOOSTRIX) 5-2.5-18.5 LF-MCG/0.5 injection Inject 0.5 mLs into the muscle once.  . zoster vaccine live, PF, (ZOSTAVAX) 53664 UNT/0.65ML injection Inject 19,400 Units into the skin once.  . zoster vaccine  live, PF, (ZOSTAVAX) 40347 UNT/0.65ML injection Inject 19,400 Units into the skin once.

## 2013-02-02 NOTE — Patient Instructions (Addendum)
Your low blood sugar today could have been dangerous if you were driving,   Please decrease your morning insulin dose to 15 units To avoid recurrence

## 2013-02-02 NOTE — Telephone Encounter (Signed)
This was addressed in office.  Patient had skipped lunch and had symptoms and was treated.  Repeat was 85 before he left.

## 2013-02-02 NOTE — Assessment & Plan Note (Signed)
ow today of 54,  Was treated with glucose tablets and peanubt butter crackers.  repeat was 85.  Patient had skipped lunch. mornign dose of insulin was reduced to 15 units .

## 2013-02-02 NOTE — Progress Notes (Signed)
Pre-visit discussion using our clinic review tool. No additional management support is needed unless otherwise documented below in the visit note.  

## 2013-02-04 ENCOUNTER — Encounter: Payer: Self-pay | Admitting: Internal Medicine

## 2013-02-04 NOTE — Assessment & Plan Note (Signed)
He is on the apprpriate therapy based on current ACC guidelines.   Liver enzymes are normal , no changes today.  Lab Results  Component Value Date   ALT 17 02/02/2013   AST 18 02/02/2013   ALKPHOS 55 02/02/2013   BILITOT 0.6 02/02/2013

## 2013-02-04 NOTE — Assessment & Plan Note (Signed)
He is currently asymptomatic and tolerating all medications  Lab Results  Component Value Date   CHOL 100 04/20/2012   HDL 30.80* 04/20/2012   LDLCALC 48 04/20/2012   LDLDIRECT 58.7 11/03/2012   TRIG 106.0 04/20/2012   CHOLHDL 3 04/20/2012

## 2013-02-04 NOTE — Assessment & Plan Note (Signed)
Well controlled on current regimen. Renal function stable, no changes today.  Lab Results  Component Value Date   CREATININE 2.0* 02/02/2013

## 2013-02-04 NOTE — Assessment & Plan Note (Addendum)
Having recurrent hypoglycemia due to continued overuse of 70/30 insulin tid.  Reducing am dose to 15 units and advised to omit lunchtime dose and not skip meals.  His repeat blood sugar was > 80 prior to leaving the office today.    hemoglobin A1c has been consistently less than 7.0 . He is up-to-date on eye exams and his foot exam is abnormal due to neuropathy and PAD.  e'll repeat his urine microalbumin to creatinine ratio at next visit. He is on the appropriate medications.  Lab Results  Component Value Date   HGBA1C 6.3 02/02/2013   Lab Results  Component Value Date   MICROALBUR 7.1* 07/28/2012

## 2013-02-04 NOTE — Assessment & Plan Note (Signed)
Renal function is stable,  A1c < 7.0  Lab Results  Component Value Date   MICROALBUR 7.1* 07/28/2012   Lab Results  Component Value Date   CREATININE 2.0* 02/02/2013   Lab Results  Component Value Date   HGBA1C 6.3 02/02/2013

## 2013-02-04 NOTE — Assessment & Plan Note (Signed)
Has lost 2 ls since last visit, BMI now 29.  I have addressed  BMI and recommended a low glycemic index diet utilizing smaller more frequent meals to increase metabolism.  He is  exercising with a goal of 30 minutes of aerobic exercise a minimum of 5 days per week.

## 2013-02-07 ENCOUNTER — Other Ambulatory Visit: Payer: Self-pay | Admitting: Internal Medicine

## 2013-02-08 ENCOUNTER — Other Ambulatory Visit: Payer: Self-pay | Admitting: Internal Medicine

## 2013-02-25 ENCOUNTER — Other Ambulatory Visit: Payer: Self-pay | Admitting: Internal Medicine

## 2013-03-10 ENCOUNTER — Other Ambulatory Visit: Payer: Self-pay | Admitting: Internal Medicine

## 2013-03-16 ENCOUNTER — Other Ambulatory Visit: Payer: Self-pay | Admitting: Internal Medicine

## 2013-04-14 ENCOUNTER — Other Ambulatory Visit: Payer: Self-pay | Admitting: Internal Medicine

## 2013-04-16 DIAGNOSIS — I10 Essential (primary) hypertension: Secondary | ICD-10-CM | POA: Diagnosis not present

## 2013-04-16 DIAGNOSIS — I2510 Atherosclerotic heart disease of native coronary artery without angina pectoris: Secondary | ICD-10-CM | POA: Diagnosis not present

## 2013-04-16 DIAGNOSIS — I495 Sick sinus syndrome: Secondary | ICD-10-CM | POA: Diagnosis not present

## 2013-04-20 DIAGNOSIS — H409 Unspecified glaucoma: Secondary | ICD-10-CM | POA: Diagnosis not present

## 2013-05-04 ENCOUNTER — Ambulatory Visit (INDEPENDENT_AMBULATORY_CARE_PROVIDER_SITE_OTHER): Payer: Medicare Other | Admitting: Internal Medicine

## 2013-05-04 ENCOUNTER — Encounter: Payer: Self-pay | Admitting: Internal Medicine

## 2013-05-04 VITALS — BP 136/70 | HR 89 | Temp 98.0°F | Resp 16 | Wt 191.2 lb

## 2013-05-04 DIAGNOSIS — I798 Other disorders of arteries, arterioles and capillaries in diseases classified elsewhere: Secondary | ICD-10-CM

## 2013-05-04 DIAGNOSIS — I10 Essential (primary) hypertension: Secondary | ICD-10-CM | POA: Diagnosis not present

## 2013-05-04 DIAGNOSIS — E1151 Type 2 diabetes mellitus with diabetic peripheral angiopathy without gangrene: Secondary | ICD-10-CM

## 2013-05-04 DIAGNOSIS — E1129 Type 2 diabetes mellitus with other diabetic kidney complication: Secondary | ICD-10-CM

## 2013-05-04 DIAGNOSIS — E1351 Other specified diabetes mellitus with diabetic peripheral angiopathy without gangrene: Secondary | ICD-10-CM | POA: Diagnosis not present

## 2013-05-04 DIAGNOSIS — E1159 Type 2 diabetes mellitus with other circulatory complications: Secondary | ICD-10-CM

## 2013-05-04 DIAGNOSIS — Z23 Encounter for immunization: Secondary | ICD-10-CM

## 2013-05-04 DIAGNOSIS — E1121 Type 2 diabetes mellitus with diabetic nephropathy: Secondary | ICD-10-CM

## 2013-05-04 DIAGNOSIS — R609 Edema, unspecified: Secondary | ICD-10-CM

## 2013-05-04 DIAGNOSIS — N058 Unspecified nephritic syndrome with other morphologic changes: Secondary | ICD-10-CM

## 2013-05-04 NOTE — Assessment & Plan Note (Signed)
His creatinine is stable. He does not desire nephrology followup. His diabetes is well-controlled and he is on angiotensin receptor blocker ,statin, and aspirin.  Lab Results  Component Value Date   CREATININE 2.0* 02/02/2013

## 2013-05-04 NOTE — Progress Notes (Signed)
Patient ID: [name], male   DOB: [birth year], 81 y.o.   MRN: [id number]   Patient Active Problem List   Diagnosis Date Noted  . Diabetes mellitus with peripheral vascular disease 07/03/2011    Priority: Medium  . Edema 11/03/2012  . Routine general medical examination at a health care facility 07/28/2012  . Overweight (BMI 25.0-29.9) 04/27/2012  . Skin lesion of scalp 07/03/2011  . Insomnia 07/03/2011  . 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 Hypertension 09/28/2010  . Hyperlipidemia LDL goal < 70 09/28/2010  . Coronary artery occlusion without or not resulting in myocardial infarction 09/28/2010  . Spinal stenosis of lumbar region 09/28/2010  . Screening for colon cancer 09/28/2010  . Neuropathy due to secondary diabetes 09/28/2010  . Peripheral vascular disease due to secondary diabetes 09/28/2010    Subjective:  CC:   Chief Complaint  Patient presents with  . Follow-up    3 month    HPI:   [name] is a 80 y.o. male who presents for   Past Medical History  Diagnosis Date  . H/O: malaria 84    in [place]  . Hyperlipidemia   . Hypertension   . Diabetes mellitus   . Renal insufficiency     by Nov 2012 labs, no old records available  . Peripheral artery disease     folowed by Dew post bypass 2011 [place]  . Blood transfusion   . Coronary artery disease     h/o bypass coronary- 2001 & peripheral- 2011 , last full cardiac visit  in [place] , [place], [employer]    Past Surgical History  Procedure Laterality Date  . Spine surgery    . Coronary artery bypass graft  2001    4 vessel, done in [place]   . Pacemaker insertion    . Femoral bypass  2011    done in [place],  now followed by Leotis Pain  . Insert / replace / remove pacemaker      2004  . Lumbar laminectomy/decompression microdiscectomy  01/16/2011    Procedure: LUMBAR LAMINECTOMY/DECOMPRESSION MICRODISCECTOMY;  Surgeon: Ophelia Charter;   Location: [facility];  Service: Neurosurgery;  Laterality: N/A;  Lumbar four and lumbar five laminectomies        The following portions of the patient's history were reviewed and updated as appropriate: Allergies, current medications, and problem list.    Review of Systems:   Patient denies headache, fevers, malaise, unintentional weight loss, skin rash, eye pain, sinus congestion and sinus pain, sore throat, dysphagia,  hemoptysis , cough, dyspnea, wheezing, chest pain, palpitations, orthopnea, edema, abdominal pain, nausea, melena, diarrhea, constipation, flank pain, dysuria, hematuria, urinary  Frequency, nocturia, numbness, tingling, seizures,  Focal weakness, Loss of consciousness,  Tremor, insomnia, depression, anxiety, and suicidal ideation.     History   Social History  . Marital Status: Married    Spouse Name: N/A    Number of Children: N/A  . Years of Education: N/A   Occupational History  . Not on file.   Social History Main Topics  . Smoking status: Former Smoker    Quit date: 09/27/1985  . Smokeless tobacco: Never Used  . Alcohol Use: No  . Drug Use: No  . Sexual Activity: Not on file   Other Topics Concern  . Not on file   Social History Narrative  . No narrative on file    Objective:  Filed Vitals:   05/04/13 1156  BP: 136/70  Pulse: 89  Temp: 98 F (36.7 C)  Resp: 16     General appearance: alert, cooperative and appears stated age Ears: normal TM's and external ear canals both ears Throat: lips, mucosa, and tongue normal; teeth and gums normal Neck: no adenopathy, no carotid bruit, supple, symmetrical, trachea midline and thyroid not enlarged, symmetric, no tenderness/mass/nodules Back: symmetric, no curvature. ROM normal. No CVA tenderness. Lungs: clear to auscultation bilaterally Heart: regular rate and rhythm, S1, S2 normal, no murmur, click, rub or gallop Abdomen: soft, non-tender; bowel sounds normal; no masses,  no  organomegaly Pulses: 2+ and symmetric Skin: Skin color, texture, turgor normal. No rashes or lesions Lymph nodes: Cervical, supraclavicular, and axillary nodes normal.  Assessment and Plan:  Diabetes mellitus with peripheral vascular disease Well controlled with recurrent  hypoglycemia occurring from overuse of insulin.  Discussed today .   He needs to reduce his use of 70/30 insulin to twice daily.  Lab Results  Component Value Date   MICROALBUR 7.1* 07/28/2012   Lab Results  Component Value Date   CREATININE 2.0* 02/02/2013   Lab Results  Component Value Date   HGBA1C 6.3 02/02/2013   Hypertension Well controlled on current regimen. Renal function  due, no changes today.  Peripheral vascular disease due to secondary diabetes He has known PVD of LE.Marland Kitchen  Foot exam was normal except for diminished pulses.no symptoms except numbness from spinal stenosis.  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 He does not desire nephrology followup. His diabetes is well-controlled and he is on angiotensin receptor blocker ,statin, and aspirin.  Lab Results  Component Value Date   CREATININE 2.0* 02/02/2013    Edema Chronic, managed with twice weekly Lasix. Last known EF was 57% by stress test in 2010.     Updated Medication List Outpatient Encounter Prescriptions as of 05/04/2013  Medication Sig  . amLODipine (NORVASC) 10 MG tablet TAKE 1 TABLET (10 MG TOTAL) BY MOUTH DAILY.  Marland Kitchen aspirin 325 MG tablet Take 325 mg by mouth daily.    Marland Kitchen atorvastatin (LIPITOR) 20 MG tablet TAKE 1 TABLET (20 MG TOTAL) BY MOUTH DAILY.  Marland Kitchen B-D ULTRAFINE III SHORT PEN 31G X 8 MM MISC USE AS DIRECTED 3 TIMES A DAY  . Calcium Carbonate-Vitamin D (CALCIUM + D) 600-200 MG-UNIT TABS Take 1 tablet by mouth 2 (two) times daily.    . clopidogrel (PLAVIX) 75 MG tablet TAKE 1 TABLET (75 MG TOTAL) BY MOUTH DAILY.  . fenofibrate  (TRICOR) 48 MG tablet TAKE 1 TABLET BY MOUTH DAILY  . furosemide (LASIX) 20 MG tablet One tablet  once or twice weekly as needed for fluid retention  . glucose blood (ACCU-CHEK AVIVA PLUS) test strip Check blood sugar 4 times daily Dx 250.70  . insulin aspart protamine-insulin aspart (NOVOLOG MIX 70/30 FLEXPEN) (70-30) 100 UNIT/ML injection Inject 18 Units into the skin 3 (three) times daily before meals.  . Insulin Syringe-Needle U-100 (B-D INS SYRINGE 0.5CC/31GX5/16) 31G X 5/16" 0.5 ML MISC 1 application by Does not apply route 3 (three) times daily before meals.  . metoprolol (LOPRESSOR) 50 MG tablet TAKE 1 TABLET BY MOUTH TWICE A DAY  . NOVOLOG MIX 70/30 FLEXPEN (70-30) 100 UNIT/ML Pen INJECT 18 UNITS INTO THE SKIN 3 (THREE) TIMES DAILY BEFORE MEALS.  Marland Kitchen travoprost, benzalkonium, (TRAVATAN) 0.004 % ophthalmic solution Place 1 drop into both eyes at  bedtime.   . valsartan (DIOVAN) 160 MG tablet TAKE 1 TABLET (160 MG TOTAL) BY MOUTH DAILY.  Marland Kitchen zoster vaccine live, PF, (ZOSTAVAX) 21308 UNT/0.65ML injection Inject 19,400 Units into the skin once.  . zoster vaccine live, PF, (ZOSTAVAX) 65784 UNT/0.65ML injection Inject 19,400 Units into the skin once.  . TDaP (BOOSTRIX) 5-2.5-18.5 LF-MCG/0.5 injection Inject 0.5 mLs into the muscle once.  . [DISCONTINUED] gabapentin (NEURONTIN) 100 MG capsule 1 to 3 capsules at bedtime as needed for tingling/numbness     Orders Placed This Encounter  Procedures  . Pneumococcal conjugate vaccine 13-valent  . Ambulatory referral to Vascular Surgery    Return in about 3 months (around 08/03/2013).

## 2013-05-04 NOTE — Assessment & Plan Note (Signed)
Well controlled on current regimen. Renal function due.,   no changes today. 

## 2013-05-04 NOTE — Assessment & Plan Note (Signed)
Chronic, managed with twice weekly Lasix. Last known EF was 57% by stress test in 2010.

## 2013-05-04 NOTE — Progress Notes (Signed)
Pre-visit discussion using our clinic review tool. No additional management support is needed unless otherwise documented below in the visit note.  

## 2013-05-04 NOTE — Assessment & Plan Note (Addendum)
He has known PVD of LE.Marland Kitchen  Foot exam was normal except for diminished pulses.no symptoms except numbness from spinal stenosis.  He is due for 6 month follow up with [facility]

## 2013-05-04 NOTE — Patient Instructions (Addendum)
You should not be having low bloodsugars if you are eating regularly and taking the right amount of insulin  70/30 is NOT meant to be taken three times daily;  This is th cause of your low blood sugars  Please submit a log of your blood sugars and the amoutn of insuli yo have been taking in one week so I can adjust your insulin  Return for fasting labs ASAP

## 2013-05-04 NOTE — Assessment & Plan Note (Signed)
Well controlled with hypoglycemi occurring from overuse of insulin.  Discussed today   Lab Results  Component Value Date   MICROALBUR 7.1* 07/28/2012   Lab Results  Component Value Date   CREATININE 2.0* 02/02/2013   Lab Results  Component Value Date   HGBA1C 6.3 02/02/2013

## 2013-05-11 DIAGNOSIS — I70219 Atherosclerosis of native arteries of extremities with intermittent claudication, unspecified extremity: Secondary | ICD-10-CM | POA: Diagnosis not present

## 2013-05-11 DIAGNOSIS — M79609 Pain in unspecified limb: Secondary | ICD-10-CM | POA: Diagnosis not present

## 2013-05-12 ENCOUNTER — Telehealth: Payer: Self-pay | Admitting: *Deleted

## 2013-05-12 DIAGNOSIS — E119 Type 2 diabetes mellitus without complications: Secondary | ICD-10-CM

## 2013-05-12 NOTE — Telephone Encounter (Signed)
Pt is coming in tomorrow what labs and dx?  

## 2013-05-13 ENCOUNTER — Other Ambulatory Visit (INDEPENDENT_AMBULATORY_CARE_PROVIDER_SITE_OTHER): Payer: Medicare Other

## 2013-05-13 DIAGNOSIS — E119 Type 2 diabetes mellitus without complications: Secondary | ICD-10-CM | POA: Diagnosis not present

## 2013-05-13 LAB — COMPREHENSIVE METABOLIC PANEL
ALT: 14 U/L (ref 0–53)
AST: 18 U/L (ref 0–37)
Albumin: 3.8 g/dL (ref 3.5–5.2)
Alkaline Phosphatase: 52 U/L (ref 39–117)
BUN: 28 mg/dL — ABNORMAL HIGH (ref 6–23)
CALCIUM: 9.2 mg/dL (ref 8.4–10.5)
CHLORIDE: 109 meq/L (ref 96–112)
CO2: 27 mEq/L (ref 19–32)
Creatinine, Ser: 1.8 mg/dL — ABNORMAL HIGH (ref 0.4–1.5)
GFR: 38.15 mL/min — ABNORMAL LOW (ref >60.00)
Glucose, Bld: 85 mg/dL (ref 70–99)
POTASSIUM: 4.5 meq/L (ref 3.5–5.1)
Sodium: 142 mEq/L (ref 135–145)
Total Bilirubin: 0.7 mg/dL (ref 0.3–1.2)
Total Protein: 6.6 g/dL (ref 6.0–8.3)

## 2013-05-13 LAB — LIPID PANEL
CHOLESTEROL: 96 mg/dL (ref 0–200)
HDL: 33.5 mg/dL — AB (ref >39.00)
LDL Cholesterol: 45 mg/dL (ref 0–99)
TRIGLYCERIDES: 88 mg/dL (ref 0.0–149.0)
Total CHOL/HDL Ratio: 3
VLDL: 17.6 mg/dL (ref 0.0–40.0)

## 2013-05-13 LAB — MICROALBUMIN / CREATININE URINE RATIO
Creatinine,U: 133.2 mg/dL
MICROALB/CREAT RATIO: 3.5 mg/g (ref 0.0–30.0)
Microalb, Ur: 4.6 mg/dL — ABNORMAL HIGH (ref 0.0–1.9)

## 2013-05-13 LAB — HEMOGLOBIN A1C: Hgb A1c MFr Bld: 6.6 % — ABNORMAL HIGH (ref 4.6–6.5)

## 2013-05-14 ENCOUNTER — Encounter: Payer: Self-pay | Admitting: Internal Medicine

## 2013-05-14 ENCOUNTER — Telehealth: Payer: Self-pay | Admitting: Internal Medicine

## 2013-05-17 ENCOUNTER — Telehealth: Payer: Self-pay | Admitting: Internal Medicine

## 2013-05-17 NOTE — Telephone Encounter (Signed)
Pt dropped off envelope for Dr. Derrel Nip.  Placed in Dr. Lupita Dawn box.

## 2013-05-17 NOTE — Telephone Encounter (Signed)
Placed in red folder  

## 2013-05-18 ENCOUNTER — Telehealth: Payer: Self-pay | Admitting: Internal Medicine

## 2013-05-18 ENCOUNTER — Telehealth: Payer: Self-pay

## 2013-05-18 NOTE — Telephone Encounter (Signed)
Patient notified and voiced understanding.

## 2013-05-18 NOTE — Telephone Encounter (Signed)
Relevant patient education assigned to patient using Emmi. ° °

## 2013-05-18 NOTE — Telephone Encounter (Signed)
New blood sugars reviewed. i would like him to resume the twice daily dosing of 70/30 insulin that I recommended, but increase to 20 units in the morning and 25 units before evening meal. This is an increase from our starting point of 18 units  and 22 units.  There is no danger in having a few elevated blood sugars   a low blood sugar from roo much insulin can kill him in his sleep.

## 2013-05-24 ENCOUNTER — Telehealth: Payer: Self-pay | Admitting: Internal Medicine

## 2013-05-24 MED ORDER — INSULIN ASPART PROT & ASPART (70-30 MIX) 100 UNIT/ML ~~LOC~~ SUSP: SUBCUTANEOUS | Status: DC

## 2013-05-24 NOTE — Telephone Encounter (Signed)
Patient notified and voiced understanding.

## 2013-05-24 NOTE — Telephone Encounter (Signed)
Pt dropped off envelope with paperwork.  Placed in Dr. Lupita Dawn box.

## 2013-05-24 NOTE — Telephone Encounter (Signed)
In red folder. 

## 2013-05-24 NOTE — Telephone Encounter (Signed)
New blood sugars reviewed per April 20 letter.  We are making progress, and avoiding lows.  Please Continue twice daily insulin injections, using the same  20 units in the morning and increase evening dose to 30 units

## 2013-06-02 ENCOUNTER — Telehealth: Payer: Self-pay | Admitting: Internal Medicine

## 2013-06-02 NOTE — Telephone Encounter (Signed)
Pt responded via mychart, message forwarded to Dr. Derrel Nip.

## 2013-06-02 NOTE — Telephone Encounter (Signed)
Please confirm that your are  taking your 70/30  insulin BEFORE dinner.  Your most recent  log of blood sugars makes no sense, because they are often much  higher 2 hours after dinner than they are at dinner, which is unusual. If you are taking it correctly,  Increase your insulin to 35 units before dinner.

## 2013-06-05 ENCOUNTER — Other Ambulatory Visit: Payer: Self-pay | Admitting: Internal Medicine

## 2013-06-11 DIAGNOSIS — E119 Type 2 diabetes mellitus without complications: Secondary | ICD-10-CM | POA: Diagnosis not present

## 2013-06-11 DIAGNOSIS — I70219 Atherosclerosis of native arteries of extremities with intermittent claudication, unspecified extremity: Secondary | ICD-10-CM | POA: Diagnosis not present

## 2013-06-11 DIAGNOSIS — I739 Peripheral vascular disease, unspecified: Secondary | ICD-10-CM | POA: Diagnosis not present

## 2013-06-11 DIAGNOSIS — I10 Essential (primary) hypertension: Secondary | ICD-10-CM | POA: Diagnosis not present

## 2013-06-14 ENCOUNTER — Telehealth: Payer: Self-pay | Admitting: Internal Medicine

## 2013-06-14 MED ORDER — INSULIN ASPART PROT & ASPART (70-30 MIX) 100 UNIT/ML ~~LOC~~ SUSP: SUBCUTANEOUS | Status: DC

## 2013-06-14 MED ORDER — INSULIN ASPART PROT & ASPART (70-30 MIX) 100 UNIT/ML PEN: PEN_INJECTOR | SUBCUTANEOUS | Status: DC

## 2013-06-14 NOTE — Telephone Encounter (Signed)
plase ask [name] to add 10 units of 70/30 at lunch time.  continue 20 units in the am and 35 units in the pm

## 2013-06-15 ENCOUNTER — Encounter: Payer: Self-pay | Admitting: Internal Medicine

## 2013-06-15 NOTE — Telephone Encounter (Signed)
Patient voiced understanding repeated directions as given.

## 2013-06-24 ENCOUNTER — Telehealth: Payer: Self-pay | Admitting: Internal Medicine

## 2013-06-24 NOTE — Telephone Encounter (Signed)
Patient notified and voiced understanding and placed CBG's to be scanned to chart.

## 2013-06-24 NOTE — Telephone Encounter (Signed)
Recent blood sugars are much better,  continue current regimen

## 2013-07-09 LAB — HM DIABETES EYE EXAM

## 2013-07-26 DIAGNOSIS — H409 Unspecified glaucoma: Secondary | ICD-10-CM | POA: Diagnosis not present

## 2013-08-03 ENCOUNTER — Other Ambulatory Visit: Payer: Self-pay | Admitting: *Deleted

## 2013-08-03 MED ORDER — FUROSEMIDE 20 MG PO TABS: ORAL_TABLET | ORAL | Status: DC

## 2013-08-09 ENCOUNTER — Ambulatory Visit: Payer: Medicare Other | Admitting: Internal Medicine

## 2013-08-23 ENCOUNTER — Other Ambulatory Visit: Payer: Self-pay | Admitting: *Deleted

## 2013-08-23 MED ORDER — METOPROLOL TARTRATE 50 MG PO TABS: ORAL_TABLET | ORAL | Status: DC

## 2013-08-23 MED ORDER — CLOPIDOGREL BISULFATE 75 MG PO TABS: ORAL_TABLET | ORAL | Status: DC

## 2013-08-27 ENCOUNTER — Ambulatory Visit: Payer: Medicare Other | Admitting: Internal Medicine

## 2013-09-03 ENCOUNTER — Other Ambulatory Visit: Payer: Self-pay | Admitting: Internal Medicine

## 2013-09-08 ENCOUNTER — Encounter: Payer: Self-pay | Admitting: Internal Medicine

## 2013-09-08 ENCOUNTER — Ambulatory Visit (INDEPENDENT_AMBULATORY_CARE_PROVIDER_SITE_OTHER): Payer: Medicare Other | Admitting: Internal Medicine

## 2013-09-08 VITALS — BP 140/62 | HR 83 | Temp 97.7°F | Resp 16 | Ht 67.5 in | Wt 191.0 lb

## 2013-09-08 DIAGNOSIS — I10 Essential (primary) hypertension: Secondary | ICD-10-CM | POA: Diagnosis not present

## 2013-09-08 DIAGNOSIS — E1151 Type 2 diabetes mellitus with diabetic peripheral angiopathy without gangrene: Secondary | ICD-10-CM

## 2013-09-08 DIAGNOSIS — I798 Other disorders of arteries, arterioles and capillaries in diseases classified elsewhere: Secondary | ICD-10-CM

## 2013-09-08 DIAGNOSIS — E1159 Type 2 diabetes mellitus with other circulatory complications: Secondary | ICD-10-CM

## 2013-09-08 DIAGNOSIS — E1351 Other specified diabetes mellitus with diabetic peripheral angiopathy without gangrene: Secondary | ICD-10-CM

## 2013-09-08 DIAGNOSIS — E1121 Type 2 diabetes mellitus with diabetic nephropathy: Secondary | ICD-10-CM

## 2013-09-08 DIAGNOSIS — E785 Hyperlipidemia, unspecified: Secondary | ICD-10-CM

## 2013-09-08 DIAGNOSIS — N058 Unspecified nephritic syndrome with other morphologic changes: Secondary | ICD-10-CM

## 2013-09-08 DIAGNOSIS — E1129 Type 2 diabetes mellitus with other diabetic kidney complication: Secondary | ICD-10-CM | POA: Diagnosis not present

## 2013-09-08 LAB — COMPREHENSIVE METABOLIC PANEL
ALK PHOS: 58 U/L (ref 39–117)
ALT: 13 U/L (ref 0–53)
AST: 18 U/L (ref 0–37)
Albumin: 3.9 g/dL (ref 3.5–5.2)
BUN: 29 mg/dL — ABNORMAL HIGH (ref 6–23)
CO2: 25 mEq/L (ref 19–32)
CREATININE: 1.9 mg/dL — AB (ref 0.4–1.5)
Calcium: 9.3 mg/dL (ref 8.4–10.5)
Chloride: 108 mEq/L (ref 96–112)
GFR: 36.93 mL/min — ABNORMAL LOW (ref >60.00)
Glucose, Bld: 74 mg/dL (ref 70–99)
POTASSIUM: 4.4 meq/L (ref 3.5–5.1)
Sodium: 141 mEq/L (ref 135–145)
Total Bilirubin: 0.6 mg/dL (ref 0.2–1.2)
Total Protein: 6.4 g/dL (ref 6.0–8.3)

## 2013-09-08 LAB — HEMOGLOBIN A1C: Hgb A1c MFr Bld: 6.6 % — ABNORMAL HIGH (ref 4.6–6.5)

## 2013-09-08 LAB — HM DIABETES FOOT EXAM

## 2013-09-08 MED ORDER — INSULIN ASPART PROT & ASPART (70-30 MIX) 100 UNIT/ML PEN: PEN_INJECTOR | SUBCUTANEOUS | Status: DC

## 2013-09-08 NOTE — Patient Instructions (Signed)
You will need 5 boxes of insulin pens for the next 90 days   See you in 3 months

## 2013-09-08 NOTE — Progress Notes (Signed)
Pre-visit discussion using our clinic review tool. No additional management support is needed unless otherwise documented below in the visit note.  

## 2013-09-09 ENCOUNTER — Encounter: Payer: Self-pay | Admitting: Internal Medicine

## 2013-09-11 ENCOUNTER — Other Ambulatory Visit: Payer: Self-pay | Admitting: Internal Medicine

## 2013-09-12 NOTE — Assessment & Plan Note (Addendum)
His creatinine is stable. He has no claudication symptoms and does not desire nephrology followup. His diabetes is well-controlled currently using lower dose of 70/30 tid without recurrent hypoglycemic events, and he is on angiotensin receptor blocker ,statin, and aspirin.  Lab Results  Component Value Date   HGBA1C 6.6* 09/08/2013     Lab Results  Component Value Date   CREATININE 1.9* 09/08/2013   Lab Results  Component Value Date   MICROALBUR 4.6* 05/13/2013

## 2013-09-12 NOTE — Assessment & Plan Note (Signed)
Well controlled on current statin therapy.   Liver enzymes are normal , no changes today.  Lab Results  Component Value Date   CHOL 96 05/13/2013   HDL 33.50* 05/13/2013   LDLCALC 45 05/13/2013   LDLDIRECT 58.7 11/03/2012   TRIG 88.0 05/13/2013   CHOLHDL 3 05/13/2013   Lab Results  Component Value Date   ALT 13 09/08/2013   AST 18 09/08/2013   ALKPHOS 58 09/08/2013   BILITOT 0.6 09/08/2013

## 2013-09-12 NOTE — Assessment & Plan Note (Signed)
He has known PVD of LE.Marland Kitchen  Foot exam was normal except for diminished pulses, and he has no symptoms except numbness from spinal stenosis.

## 2013-09-12 NOTE — Assessment & Plan Note (Addendum)
His creatinine is stable. He does not desire nephrology followup. His diabetes is well-controlled and he is on angiotensin receptor blocker ,statin, and aspirin.  Lab Results  Component Value Date   CREATININE 1.9* 09/08/2013   Lab Results  Component Value Date   MICROALBUR 4.6* 05/13/2013

## 2013-09-12 NOTE — Progress Notes (Signed)
Patient ID: [name], male   DOB: September 10, 1924, 80 y.o.   MRN: [id number]  Patient Active Problem List   Diagnosis Date Noted  . Diabetes mellitus with peripheral vascular disease 07/03/2011    Priority: Medium  . Edema 11/03/2012  . Routine general medical examination at a health care facility 07/28/2012  . Overweight (BMI 25.0-29.9) 04/27/2012  . Skin lesion of scalp 07/03/2011  . Insomnia 07/03/2011  . 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 Hypertension 09/28/2010  . Hyperlipidemia with target LDL less than 70 09/28/2010  . Coronary artery occlusion without or not resulting in myocardial infarction 09/28/2010  . Spinal stenosis of lumbar region 09/28/2010  . Screening for colon cancer 09/28/2010  . Neuropathy due to secondary diabetes 09/28/2010  . Peripheral vascular disease due to secondary diabetes 09/28/2010    Subjective:  CC:   Chief Complaint  Patient presents with  . Follow-up  . Diabetes    HPI:   [name] is a 80 y.o. male who presents for 3 month follow up on DM Type 2, controlled with insulin, hypertension, and PAD. Since his last visit his insulin regimen was modified to avoid recurrent hypoglycemic events.  He had been taking 70/30 three times daily despite multiple recommendations to stop using it so often. His blood sugars were elevated for a few weeks, but weekly titrations were done  And currently he has them under control.  He has no new issues.       Past Medical History  Diagnosis Date  . H/O: malaria 58    in [place]  . Hyperlipidemia   . Hypertension   . Diabetes mellitus   . Renal insufficiency     by Nov 2012 labs, no old records available  . Peripheral artery disease     folowed by Dew post bypass 2011 [place]  . Blood transfusion   . Coronary artery disease     h/o bypass coronary- 2001 & peripheral- 2011 , last full cardiac visit  in [place] , [place], [employer]    Past  Surgical History  Procedure Laterality Date  . Spine surgery    . Coronary artery bypass graft  2001    4 vessel, done [place]   . Pacemaker insertion    . Femoral bypass  2011    done in [place],  now followed by [facility]  . Insert / replace / remove pacemaker      2004  . Lumbar laminectomy/decompression microdiscectomy  01/16/2011    Procedure: LUMBAR LAMINECTOMY/DECOMPRESSION MICRODISCECTOMY;  Surgeon: Ophelia Charter;  Location: [facility];  Service: Neurosurgery;  Laterality: N/A;  Lumbar four and lumbar five laminectomies        The following portions of the patient's history were reviewed and updated as appropriate: Allergies, current medications, and problem list.    Review of Systems:   Patient denies headache, fevers, malaise, unintentional weight loss, skin rash, eye pain, sinus congestion and sinus pain, sore throat, dysphagia,  hemoptysis , cough, dyspnea, wheezing, chest pain, palpitations, orthopnea, edema, abdominal pain, nausea, melena, diarrhea, constipation, flank pain, dysuria, hematuria, urinary  Frequency, nocturia, numbness, tingling, seizures,  Focal weakness, Loss of consciousness,  Tremor, insomnia, depression, anxiety, and suicidal ideation.     History   Social History  . Marital Status: Married    Spouse Name: N/A    Number of Children: N/A  . Years of Education: N/A   Occupational History  .  Not on file.   Social History Main Topics  . Smoking status: Former Smoker    Quit date: 09/27/1985  . Smokeless tobacco: Never Used  . Alcohol Use: No  . Drug Use: No  . Sexual Activity: Not on file   Other Topics Concern  . Not on file   Social History Narrative  . No narrative on file    Objective:  Filed Vitals:   09/08/13 1438  BP: 140/62  Pulse: 83  Temp: 97.7 F (36.5 C)  Resp: 16     General appearance: alert, cooperative and appears stated age Ears: normal TM's and external ear canals both ears Throat: lips, mucosa, and  tongue normal; teeth and gums normal Neck: no adenopathy, no carotid bruit, supple, symmetrical, trachea midline and thyroid not enlarged, symmetric, no tenderness/mass/nodules Back: symmetric, no curvature. ROM normal. No CVA tenderness. Lungs: clear to auscultation bilaterally Heart: regular rate and rhythm, S1, S2 normal, no murmur, click, rub or gallop Abdomen: soft, non-tender; bowel sounds normal; no masses,  no organomegaly Pulses: 2+ and symmetric Skin: Skin color, texture, turgor normal. No rashes or lesions Lymph nodes: Cervical, supraclavicular, and axillary nodes normal.  Assessment and Plan:  Diabetes mellitus with peripheral vascular disease His creatinine is stable. He has no claudication symptoms and does not desire nephrology followup. His diabetes is well-controlled currently using lower dose of 70/30 tid without recurrent hypoglycemic events, and he is on angiotensin receptor blocker ,statin, and aspirin.  Lab Results  Component Value Date   HGBA1C 6.6* 09/08/2013     Lab Results  Component Value Date   CREATININE 1.9* 09/08/2013   Lab Results  Component Value Date   MICROALBUR 4.6* 05/13/2013        Peripheral vascular disease due to secondary diabetes He has known PVD of LE.Marland Kitchen  Foot exam was normal except for diminished pulses, and he has no symptoms except numbness from spinal stenosis.      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 He does not desire nephrology followup. His diabetes is well-controlled and he is on angiotensin receptor blocker ,statin, and aspirin.  Lab Results  Component Value Date   CREATININE 1.9* 09/08/2013   Lab Results  Component Value Date   MICROALBUR 4.6* 05/13/2013        Hypertension Well controlled on current regimen. Renal function stable, no changes today.  Lab Results  Component Value Date   CREATININE 1.9* 09/08/2013    Hyperlipidemia with target LDL less than  56 Well controlled on current statin therapy.   Liver enzymes are normal , no changes today.  Lab Results  Component Value Date   CHOL 96 05/13/2013   HDL 33.50* 05/13/2013   LDLCALC 45 05/13/2013   LDLDIRECT 58.7 11/03/2012   TRIG 88.0 05/13/2013   CHOLHDL 3 05/13/2013   Lab Results  Component Value Date   ALT 13 09/08/2013   AST 18 09/08/2013   ALKPHOS 58 09/08/2013   BILITOT 0.6 09/08/2013       Updated Medication List Outpatient Encounter Prescriptions as of 09/08/2013  Medication Sig  . amLODipine (NORVASC) 10 MG tablet TAKE 1 TABLET (10 MG TOTAL) BY MOUTH DAILY.  Marland Kitchen aspirin 325 MG tablet Take 325 mg by mouth daily.    Marland Kitchen atorvastatin (LIPITOR) 20 MG tablet TAKE 1 TABLET (20 MG TOTAL) BY MOUTH DAILY.  Marland Kitchen B-D ULTRAFINE III SHORT PEN 31G X 8 MM MISC USE AS DIRECTED 3 TIMES A DAY  .  Calcium Carbonate-Vitamin D (CALCIUM + D) 600-200 MG-UNIT TABS Take 1 tablet by mouth 2 (two) times daily.    . clopidogrel (PLAVIX) 75 MG tablet TAKE 1 TABLET (75 MG TOTAL) BY MOUTH DAILY.  . fenofibrate (TRICOR) 48 MG tablet TAKE 1 TABLET BY MOUTH DAILY  . furosemide (LASIX) 20 MG tablet One tablet  once or twice weekly as needed for fluid retention  . glucose blood (ACCU-CHEK AVIVA PLUS) test strip Check blood sugar 4 times daily Dx 250.70  . Insulin Aspart Prot & Aspart (NOVOLOG 70/30 MIX) (70-30) 100 UNIT/ML Pen INJECT 20 UNITS  Before breakfast,.   20 units at lunch,  And 35 units before dinner  . Insulin Syringe-Needle U-100 (B-D INS SYRINGE 0.5CC/31GX5/16) 31G X 5/16" 0.5 ML MISC 1 application by Does not apply route 3 (three) times daily before meals.  . metoprolol (LOPRESSOR) 50 MG tablet TAKE 1 TABLET BY MOUTH TWICE A DAY  . travoprost, benzalkonium, (TRAVATAN) 0.004 % ophthalmic solution Place 1 drop into both eyes at bedtime.   . valsartan (DIOVAN) 160 MG tablet TAKE 1 TABLET BY MOUTH DAILY  . [DISCONTINUED] Insulin Aspart Prot & Aspart (NOVOLOG 70/30 MIX) (70-30) 100 UNIT/ML Pen INJECT 20 UNITS  Before  breakfast,.   20 units at lunch,  And 35 units before dinner  . [DISCONTINUED] Insulin Aspart Prot & Aspart (NOVOLOG MIX 70/30 FLEXPEN) (70-30) 100 UNIT/ML Pen INJECT 20 UNITS  Before breakfast,.   10 units at lunch,  And 35 units before dinner  . [DISCONTINUED] insulin aspart protamine- aspart (NOVOLOG MIX 70/30) (70-30) 100 UNIT/ML injection 20 units in the AM and 30 units in the PM before meals  . [DISCONTINUED] TDaP (BOOSTRIX) 5-2.5-18.5 LF-MCG/0.5 injection Inject 0.5 mLs into the muscle once.  . [DISCONTINUED] zoster vaccine live, PF, (ZOSTAVAX) 16109 UNT/0.65ML injection Inject 19,400 Units into the skin once.  . [DISCONTINUED] zoster vaccine live, PF, (ZOSTAVAX) 60454 UNT/0.65ML injection Inject 19,400 Units into the skin once.     Orders Placed This Encounter  Procedures  . Hemoglobin A1c  . Comprehensive metabolic panel  . HM DIABETES EYE EXAM  . HM DIABETES FOOT EXAM    Return in about 3 months (around 12/09/2013) for follow up diabetes.

## 2013-09-12 NOTE — Assessment & Plan Note (Signed)
Well controlled on current regimen. Renal function stable, no changes today.  Lab Results  Component Value Date   CREATININE 1.9* 09/08/2013

## 2013-09-14 DIAGNOSIS — L219 Seborrheic dermatitis, unspecified: Secondary | ICD-10-CM | POA: Diagnosis not present

## 2013-09-14 DIAGNOSIS — D235 Other benign neoplasm of skin of trunk: Secondary | ICD-10-CM | POA: Diagnosis not present

## 2013-09-14 DIAGNOSIS — L570 Actinic keratosis: Secondary | ICD-10-CM | POA: Diagnosis not present

## 2013-10-01 ENCOUNTER — Other Ambulatory Visit: Payer: Self-pay | Admitting: *Deleted

## 2013-10-01 MED ORDER — GLUCOSE BLOOD VI STRP: ORAL_STRIP | Status: DC

## 2013-10-01 NOTE — Progress Notes (Signed)
Patient needed refill on test strips

## 2013-10-18 DIAGNOSIS — I25810 Atherosclerosis of coronary artery bypass graft(s) without angina pectoris: Secondary | ICD-10-CM | POA: Diagnosis not present

## 2013-10-18 DIAGNOSIS — I10 Essential (primary) hypertension: Secondary | ICD-10-CM | POA: Diagnosis not present

## 2013-10-18 DIAGNOSIS — E785 Hyperlipidemia, unspecified: Secondary | ICD-10-CM | POA: Diagnosis not present

## 2013-10-30 ENCOUNTER — Ambulatory Visit (INDEPENDENT_AMBULATORY_CARE_PROVIDER_SITE_OTHER): Payer: Medicare Other

## 2013-10-30 DIAGNOSIS — Z23 Encounter for immunization: Secondary | ICD-10-CM | POA: Diagnosis not present

## 2013-11-09 DIAGNOSIS — H4011X2 Primary open-angle glaucoma, moderate stage: Secondary | ICD-10-CM | POA: Diagnosis not present

## 2013-12-05 ENCOUNTER — Other Ambulatory Visit: Payer: Self-pay | Admitting: Internal Medicine

## 2013-12-10 ENCOUNTER — Telehealth: Payer: Self-pay | Admitting: *Deleted

## 2013-12-10 ENCOUNTER — Other Ambulatory Visit (INDEPENDENT_AMBULATORY_CARE_PROVIDER_SITE_OTHER): Payer: Medicare Other

## 2013-12-10 DIAGNOSIS — E119 Type 2 diabetes mellitus without complications: Secondary | ICD-10-CM

## 2013-12-10 LAB — COMPREHENSIVE METABOLIC PANEL
ALBUMIN: 3.4 g/dL — AB (ref 3.5–5.2)
ALT: 12 U/L (ref 0–53)
AST: 15 U/L (ref 0–37)
Alkaline Phosphatase: 51 U/L (ref 39–117)
BUN: 33 mg/dL — ABNORMAL HIGH (ref 6–23)
CO2: 27 mEq/L (ref 19–32)
Calcium: 9.3 mg/dL (ref 8.4–10.5)
Chloride: 111 mEq/L (ref 96–112)
Creatinine, Ser: 2.2 mg/dL — ABNORMAL HIGH (ref 0.4–1.5)
GFR: 30.22 mL/min — AB (ref >60.00)
GLUCOSE: 58 mg/dL — AB (ref 70–99)
POTASSIUM: 4.5 meq/L (ref 3.5–5.1)
SODIUM: 144 meq/L (ref 135–145)
Total Bilirubin: 0.5 mg/dL (ref 0.2–1.2)
Total Protein: 6.4 g/dL (ref 6.0–8.3)

## 2013-12-10 LAB — LIPID PANEL
Cholesterol: 103 mg/dL (ref 0–200)
HDL: 26.9 mg/dL — ABNORMAL LOW (ref >39.00)
LDL Cholesterol: 60 mg/dL (ref 0–99)
NonHDL: 76.1
TRIGLYCERIDES: 81 mg/dL (ref 0.0–149.0)
Total CHOL/HDL Ratio: 4
VLDL: 16.2 mg/dL (ref 0.0–40.0)

## 2013-12-10 LAB — HEMOGLOBIN A1C: Hgb A1c MFr Bld: 6.4 % (ref 4.6–6.5)

## 2013-12-10 NOTE — Telephone Encounter (Signed)
What labs and dx?  

## 2013-12-13 ENCOUNTER — Encounter: Payer: Self-pay | Admitting: Internal Medicine

## 2013-12-13 ENCOUNTER — Ambulatory Visit (INDEPENDENT_AMBULATORY_CARE_PROVIDER_SITE_OTHER): Payer: Medicare Other | Admitting: Internal Medicine

## 2013-12-13 VITALS — BP 118/62 | HR 88 | Temp 97.7°F | Resp 16 | Ht 67.5 in | Wt 191.0 lb

## 2013-12-13 DIAGNOSIS — E1121 Type 2 diabetes mellitus with diabetic nephropathy: Secondary | ICD-10-CM

## 2013-12-13 DIAGNOSIS — N183 Chronic kidney disease, stage 3 unspecified: Secondary | ICD-10-CM

## 2013-12-13 DIAGNOSIS — E785 Hyperlipidemia, unspecified: Secondary | ICD-10-CM | POA: Diagnosis not present

## 2013-12-13 DIAGNOSIS — E1151 Type 2 diabetes mellitus with diabetic peripheral angiopathy without gangrene: Secondary | ICD-10-CM | POA: Diagnosis not present

## 2013-12-13 MED ORDER — INSULIN ASPART PROT & ASPART (70-30 MIX) 100 UNIT/ML PEN: PEN_INJECTOR | SUBCUTANEOUS | Status: DC

## 2013-12-13 NOTE — Progress Notes (Signed)
Pre-visit discussion using our clinic review tool. No additional management support is needed unless otherwise documented below in the visit note.  

## 2013-12-13 NOTE — Patient Instructions (Addendum)
Your diabetes remains under excellent control  .   Please continue your current medications. return in 6 months for follow up on diabetes and make sure you are seeing your eye doctor at least once a year.     I want you to eat an ounce of nuts daily to help bring up your HDL (the good cholesterol)

## 2013-12-13 NOTE — Progress Notes (Signed)
Patient ID: [name], male   DOB: 26-Nov-1924, 80 y.o.   MRN: [id number]  Patient Active Problem List   Diagnosis Date Noted  . Diabetes mellitus with peripheral vascular disease 07/03/2011    Priority: Medium  . CKD (chronic kidney disease) stage 3, GFR 30-59 ml/min 12/14/2013  . Edema 11/03/2012  . Routine general medical examination at a health care facility 07/28/2012  . Overweight (BMI 25.0-29.9) 04/27/2012  . Skin lesion of scalp 07/03/2011  . Insomnia 07/03/2011  . 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 Hypertension 09/28/2010  . Hyperlipidemia with target LDL less than 70 09/28/2010  . Coronary artery occlusion without or not resulting in myocardial infarction 09/28/2010  . Spinal stenosis of lumbar region 09/28/2010  . Screening for colon cancer 09/28/2010  . Neuropathy due to secondary diabetes 09/28/2010  . Peripheral vascular disease due to secondary diabetes 09/28/2010    Subjective:  CC:   Chief Complaint  Patient presents with  . Follow-up  . Diabetes    HPI:   [name] is a 80 y.o. male who presents for 3 month follow up on type 2 DM controlled with insulin.  After an attempt to simply his regimen to two injections of 70/30 daily,  He has reverted back to his former habit of administering 18 units 70/30 twice daily and breakfast and lunch,  And 36 units before dinner.  He denies any hypoglycemic events.  Fasting Labs reviewed.     He has no complaints today.        Past Medical History  Diagnosis Date  . H/O: malaria 66    in [place]  . Hyperlipidemia   . Hypertension   . Diabetes mellitus   . Renal insufficiency     by Nov 2012 labs, no old records available  . Peripheral artery disease     folowed by Dew post bypass 2011 [place]  . Blood transfusion   . Coronary artery disease     h/o bypass coronary- 2001 & peripheral- 2011 , last full cardiac visit  in [place] , [place], [employer]     Past Surgical History  Procedure Laterality Date  . Spine surgery    . Coronary artery bypass graft  2001    4 vessel, done in [place]   . Pacemaker insertion    . Femoral bypass  2011    done in [place],  now followed by [facility]  . Insert / replace / remove pacemaker      2004  . Lumbar laminectomy/decompression microdiscectomy  01/16/2011    Procedure: LUMBAR LAMINECTOMY/DECOMPRESSION MICRODISCECTOMY;  Surgeon: Ophelia Charter;  Location: [facility];  Service: Neurosurgery;  Laterality: N/A;  Lumbar four and lumbar five laminectomies        The following portions of the patient's history were reviewed and updated as appropriate: Allergies, current medications, and problem list.    Review of Systems:   Patient denies headache, fevers, malaise, unintentional weight loss, skin rash, eye pain, sinus congestion and sinus pain, sore throat, dysphagia,  hemoptysis , cough, dyspnea, wheezing, chest pain, palpitations, orthopnea, edema, abdominal pain, nausea, melena, diarrhea, constipation, flank pain, dysuria, hematuria, urinary  Frequency, nocturia, numbness, tingling, seizures,  Focal weakness, Loss of consciousness,  Tremor, insomnia, depression, anxiety, and suicidal ideation.     History   Social History  . Marital Status: Married    Spouse Name: N/A    Number of Children: N/A  . Years  of Education: N/A   Occupational History  . Not on file.   Social History Main Topics  . Smoking status: Former Smoker    Quit date: 09/27/1985  . Smokeless tobacco: Never Used  . Alcohol Use: No  . Drug Use: No  . Sexual Activity: Not on file   Other Topics Concern  . Not on file   Social History Narrative    Objective:  Filed Vitals:   12/13/13 1423  BP: 118/62  Pulse: 88  Temp: 97.7 F (36.5 C)  Resp: 16     General appearance: alert, cooperative and appears stated age Ears: normal TM's and external ear canals both ears Throat: lips, mucosa, and tongue normal;  teeth and gums normal Neck: no adenopathy, no carotid bruit, supple, symmetrical, trachea midline and thyroid not enlarged, symmetric, no tenderness/mass/nodules Back: symmetric, no curvature. ROM normal. No CVA tenderness. Lungs: clear to auscultation bilaterally Heart: regular rate and rhythm, S1, S2 normal, no murmur, click, rub or gallop Abdomen: soft, non-tender; bowel sounds normal; no masses,  no organomegaly Pulses: 2+ and symmetric Skin: Skin color, texture, turgor normal. No rashes or lesions Lymph nodes: Cervical, supraclavicular, and axillary nodes normal.  Assessment and Plan:  Diabetes mellitus with peripheral vascular disease  His diabetes is well-controlled currently using lower dose of 70/30 tid without recurrent hypoglycemic events, and he is on angiotensin receptor blocker ,statin, and aspirin.  Lab Results  Component Value Date   HGBA1C 6.4 12/10/2013     Lab Results  Component Value Date   CREATININE 2.1* 12/13/2013   Lab Results  Component Value Date   MICROALBUR 4.6* 05/13/2013           CKD (chronic kidney disease) stage 3, GFR 30-59 ml/min His creatinine  Has increased recently but is  stable. He does not desire nephrology followup. His diabetes is well-controlled and he is on angiotensin receptor blocker ,statin, and aspirin.  Lab Results  Component Value Date   CREATININE 2.1* 12/13/2013   Lab Results  Component Value Date   MICROALBUR 4.6* 05/13/2013          Hyperlipidemia with target LDL less than 33 Well controlled on current statin therapy.   Liver enzymes are normal , no changes today.  Lab Results  Component Value Date   CHOL 103 12/10/2013   HDL 26.90* 12/10/2013   LDLCALC 60 12/10/2013   LDLDIRECT 58.7 11/03/2012   TRIG 81.0 12/10/2013   CHOLHDL 4 12/10/2013   Lab Results  Component Value Date   ALT 12 12/10/2013   AST 15 12/10/2013   ALKPHOS 51 12/10/2013   BILITOT 0.5 12/10/2013       Type 2 diabetes  mellitus with established diabetic nephropathy His creatinine has increased slightly on recent fasting labs and on repeat is .   He does not desire nephrology followup. His diabetes is well-controlled and he is on angiotensin receptor blocker ,statin, and aspirin. H eis not using NSAIDs. Will repeat Cr nonfasting   Lab Results  Component Value Date   CREATININE 2.1* 12/13/2013   Lab Results  Component Value Date   MICROALBUR 4.6* 05/13/2013           Updated Medication List Outpatient Encounter Prescriptions as of 12/13/2013  Medication Sig  . amLODipine (NORVASC) 10 MG tablet TAKE 1 TABLET (10 MG TOTAL) BY MOUTH DAILY.  Marland Kitchen aspirin 325 MG tablet Take 325 mg by mouth daily.    Marland Kitchen atorvastatin (LIPITOR) 20 MG tablet TAKE 1 TABLET (  20 MG TOTAL) BY MOUTH DAILY.  Marland Kitchen B-D ULTRAFINE III SHORT PEN 31G X 8 MM MISC USE AS DIRECTED 3 TIMES A DAY  . Calcium Carbonate-Vitamin D (CALCIUM + D) 600-200 MG-UNIT TABS Take 1 tablet by mouth 2 (two) times daily.    . clopidogrel (PLAVIX) 75 MG tablet TAKE 1 TABLET (75 MG TOTAL) BY MOUTH DAILY.  . fenofibrate (TRICOR) 48 MG tablet TAKE 1 TABLET BY MOUTH DAILY  . furosemide (LASIX) 20 MG tablet One tablet  once or twice weekly as needed for fluid retention  . glucose blood (ACCU-CHEK AVIVA PLUS) test strip Check blood sugar 4 times daily, and PRN Dx 250.70  . hydrocortisone 2.5 % cream Apply 1 application topically 2 (two) times daily as needed.  . Insulin Aspart Prot & Aspart (NOVOLOG 70/30 MIX) (70-30) 100 UNIT/ML Pen INJECT 24 UNITS  Before breakfast,.   24 units at lunch,  And 35 units before dinner as needed  . metoprolol (LOPRESSOR) 50 MG tablet TAKE 1 TABLET BY MOUTH TWICE A DAY  . travoprost, benzalkonium, (TRAVATAN) 0.004 % ophthalmic solution Place 1 drop into both eyes at bedtime.   . valsartan (DIOVAN) 160 MG tablet TAKE 1 TABLET BY MOUTH DAILY  . [DISCONTINUED] Insulin Aspart Prot & Aspart (NOVOLOG 70/30 MIX) (70-30) 100 UNIT/ML Pen INJECT 20  UNITS  Before breakfast,.   20 units at lunch,  And 35 units before dinner  . Insulin Syringe-Needle U-100 (B-D INS SYRINGE 0.5CC/31GX5/16) 31G X 5/16" 0.5 ML MISC 1 application by Does not apply route 3 (three) times daily before meals.     Orders Placed This Encounter  Procedures  . Basic metabolic panel  . PTH, intact and calcium    Return in about 6 months (around 06/13/2014) for follow up diabetes.

## 2013-12-14 DIAGNOSIS — N183 Chronic kidney disease, stage 3 unspecified: Secondary | ICD-10-CM | POA: Insufficient documentation

## 2013-12-14 LAB — BASIC METABOLIC PANEL
BUN: 35 mg/dL — AB (ref 6–23)
CO2: 22 meq/L (ref 19–32)
CREATININE: 2.1 mg/dL — AB (ref 0.4–1.5)
Calcium: 9.4 mg/dL (ref 8.4–10.5)
Chloride: 109 mEq/L (ref 96–112)
GFR: 32.24 mL/min — ABNORMAL LOW (ref >60.00)
Glucose, Bld: 95 mg/dL (ref 70–99)
POTASSIUM: 4.6 meq/L (ref 3.5–5.1)
Sodium: 144 mEq/L (ref 135–145)

## 2013-12-14 LAB — PTH, INTACT AND CALCIUM
CALCIUM: 9.4 mg/dL (ref 8.4–10.5)
PTH: 58 pg/mL (ref 14–64)

## 2013-12-14 NOTE — Assessment & Plan Note (Signed)
His diabetes is well-controlled currently using lower dose of 70/30 tid without recurrent hypoglycemic events, and he is on angiotensin receptor blocker ,statin, and aspirin.  Lab Results  Component Value Date   HGBA1C 6.4 12/10/2013     Lab Results  Component Value Date   CREATININE 2.1* 12/13/2013   Lab Results  Component Value Date   MICROALBUR 4.6* 05/13/2013

## 2013-12-14 NOTE — Assessment & Plan Note (Signed)
Well controlled on current statin therapy.   Liver enzymes are normal , no changes today.  Lab Results  Component Value Date   CHOL 103 12/10/2013   HDL 26.90* 12/10/2013   LDLCALC 60 12/10/2013   LDLDIRECT 58.7 11/03/2012   TRIG 81.0 12/10/2013   CHOLHDL 4 12/10/2013   Lab Results  Component Value Date   ALT 12 12/10/2013   AST 15 12/10/2013   ALKPHOS 51 12/10/2013   BILITOT 0.5 12/10/2013

## 2013-12-14 NOTE — Assessment & Plan Note (Signed)
His creatinine  Has increased recently but is  stable. He does not desire nephrology followup. His diabetes is well-controlled and he is on angiotensin receptor blocker ,statin, and aspirin.  Lab Results  Component Value Date   CREATININE 2.1* 12/13/2013   Lab Results  Component Value Date   MICROALBUR 4.6* 05/13/2013

## 2013-12-14 NOTE — Assessment & Plan Note (Signed)
His creatinine has increased slightly on recent fasting labs and on repeat is .   He does not desire nephrology followup. His diabetes is well-controlled and he is on angiotensin receptor blocker ,statin, and aspirin. H eis not using NSAIDs. Will repeat Cr nonfasting   Lab Results  Component Value Date   CREATININE 2.1* 12/13/2013   Lab Results  Component Value Date   MICROALBUR 4.6* 05/13/2013

## 2013-12-17 ENCOUNTER — Encounter: Payer: Self-pay | Admitting: Internal Medicine

## 2014-02-17 ENCOUNTER — Other Ambulatory Visit: Payer: Self-pay | Admitting: Internal Medicine

## 2014-02-23 ENCOUNTER — Other Ambulatory Visit: Payer: Self-pay | Admitting: *Deleted

## 2014-02-23 MED ORDER — GLUCOSE BLOOD VI STRP: ORAL_STRIP | Status: DC

## 2014-02-28 ENCOUNTER — Other Ambulatory Visit: Payer: Self-pay | Admitting: Internal Medicine

## 2014-03-09 DIAGNOSIS — E119 Type 2 diabetes mellitus without complications: Secondary | ICD-10-CM | POA: Diagnosis not present

## 2014-03-09 DIAGNOSIS — H4011X1 Primary open-angle glaucoma, mild stage: Secondary | ICD-10-CM | POA: Diagnosis not present

## 2014-03-09 DIAGNOSIS — H52223 Regular astigmatism, bilateral: Secondary | ICD-10-CM | POA: Diagnosis not present

## 2014-03-09 DIAGNOSIS — Z961 Presence of intraocular lens: Secondary | ICD-10-CM | POA: Diagnosis not present

## 2014-03-17 ENCOUNTER — Other Ambulatory Visit: Payer: Self-pay | Admitting: Internal Medicine

## 2014-04-14 DIAGNOSIS — I25810 Atherosclerosis of coronary artery bypass graft(s) without angina pectoris: Secondary | ICD-10-CM | POA: Diagnosis not present

## 2014-04-14 DIAGNOSIS — I10 Essential (primary) hypertension: Secondary | ICD-10-CM | POA: Diagnosis not present

## 2014-04-14 DIAGNOSIS — I701 Atherosclerosis of renal artery: Secondary | ICD-10-CM | POA: Diagnosis not present

## 2014-04-14 DIAGNOSIS — E119 Type 2 diabetes mellitus without complications: Secondary | ICD-10-CM | POA: Diagnosis not present

## 2014-04-19 DIAGNOSIS — I495 Sick sinus syndrome: Secondary | ICD-10-CM | POA: Diagnosis not present

## 2014-05-16 ENCOUNTER — Other Ambulatory Visit: Payer: Self-pay | Admitting: Internal Medicine

## 2014-05-16 MED ORDER — GLUCOSE BLOOD VI STRP: ORAL_STRIP | Status: DC

## 2014-05-16 NOTE — Telephone Encounter (Signed)
Sent patient script in for blood glucose test strip electronically

## 2014-05-29 DIAGNOSIS — J00 Acute nasopharyngitis [common cold]: Secondary | ICD-10-CM | POA: Diagnosis not present

## 2014-05-30 ENCOUNTER — Other Ambulatory Visit: Payer: Self-pay | Admitting: Internal Medicine

## 2014-06-10 ENCOUNTER — Telehealth: Payer: Self-pay | Admitting: Internal Medicine

## 2014-06-10 NOTE — Telephone Encounter (Signed)
Can we please send a sympathy card to  [name] on the passing of his wife [name]?  Thank you  TT

## 2014-06-10 NOTE — Telephone Encounter (Signed)
I have left it in your blue folder.

## 2014-06-13 ENCOUNTER — Encounter: Payer: Self-pay | Admitting: Internal Medicine

## 2014-06-13 ENCOUNTER — Ambulatory Visit (INDEPENDENT_AMBULATORY_CARE_PROVIDER_SITE_OTHER): Payer: Medicare Other | Admitting: Internal Medicine

## 2014-06-13 VITALS — BP 126/60 | HR 78 | Temp 98.4°F | Resp 14 | Ht 67.5 in | Wt 191.5 lb

## 2014-06-13 DIAGNOSIS — I10 Essential (primary) hypertension: Secondary | ICD-10-CM

## 2014-06-13 DIAGNOSIS — N189 Chronic kidney disease, unspecified: Secondary | ICD-10-CM

## 2014-06-13 DIAGNOSIS — R010 Benign and innocent cardiac murmurs: Secondary | ICD-10-CM | POA: Diagnosis not present

## 2014-06-13 DIAGNOSIS — E1151 Type 2 diabetes mellitus with diabetic peripheral angiopathy without gangrene: Secondary | ICD-10-CM

## 2014-06-13 DIAGNOSIS — E119 Type 2 diabetes mellitus without complications: Secondary | ICD-10-CM

## 2014-06-13 DIAGNOSIS — E785 Hyperlipidemia, unspecified: Secondary | ICD-10-CM

## 2014-06-13 DIAGNOSIS — E1340 Other specified diabetes mellitus with diabetic neuropathy, unspecified: Secondary | ICD-10-CM

## 2014-06-13 DIAGNOSIS — R609 Edema, unspecified: Secondary | ICD-10-CM

## 2014-06-13 DIAGNOSIS — E1122 Type 2 diabetes mellitus with diabetic chronic kidney disease: Secondary | ICD-10-CM

## 2014-06-13 DIAGNOSIS — R011 Cardiac murmur, unspecified: Secondary | ICD-10-CM

## 2014-06-13 DIAGNOSIS — F4321 Adjustment disorder with depressed mood: Secondary | ICD-10-CM | POA: Diagnosis not present

## 2014-06-13 LAB — COMPREHENSIVE METABOLIC PANEL
ALT: 16 U/L (ref 0–53)
AST: 20 U/L (ref 0–37)
Albumin: 3.8 g/dL (ref 3.5–5.2)
Alkaline Phosphatase: 57 U/L (ref 39–117)
BUN: 36 mg/dL — ABNORMAL HIGH (ref 6–23)
CO2: 27 meq/L (ref 19–32)
CREATININE: 1.97 mg/dL — AB (ref 0.40–1.50)
Calcium: 9.7 mg/dL (ref 8.4–10.5)
Chloride: 109 mEq/L (ref 96–112)
GFR: 34.29 mL/min — ABNORMAL LOW (ref >60.00)
Glucose, Bld: 107 mg/dL — ABNORMAL HIGH (ref 70–99)
Potassium: 4.4 mEq/L (ref 3.5–5.1)
Sodium: 142 mEq/L (ref 135–145)
Total Bilirubin: 0.4 mg/dL (ref 0.2–1.2)
Total Protein: 6.7 g/dL (ref 6.0–8.3)

## 2014-06-13 LAB — LIPID PANEL
CHOL/HDL RATIO: 3
Cholesterol: 95 mg/dL (ref 0–200)
HDL: 31.6 mg/dL — AB (ref >39.00)
LDL Cholesterol: 38 mg/dL (ref 0–99)
NonHDL: 63.4
Triglycerides: 129 mg/dL (ref 0.0–149.0)
VLDL: 25.8 mg/dL (ref 0.0–40.0)

## 2014-06-13 LAB — HEMOGLOBIN A1C: Hgb A1c MFr Bld: 6.6 % — ABNORMAL HIGH (ref 4.6–6.5)

## 2014-06-13 LAB — MICROALBUMIN / CREATININE URINE RATIO
Creatinine,U: 147.8 mg/dL
MICROALB UR: 2.7 mg/dL — AB (ref 0.0–1.9)
Microalb Creat Ratio: 1.8 mg/g (ref 0.0–30.0)

## 2014-06-13 LAB — LDL CHOLESTEROL, DIRECT: Direct LDL: 45 mg/dL

## 2014-06-13 LAB — TSH: TSH: 1.49 u[IU]/mL (ref 0.35–4.50)

## 2014-06-13 NOTE — Progress Notes (Signed)
Patient ID: [name], male   DOB: September 16, 1924, 81 y.o.   MRN: [id number]  Patient Active Problem List   Diagnosis Date Noted  . Diabetes mellitus with peripheral vascular disease 07/03/2011    Priority: Medium  . Grief reaction 06/14/2014  . Diabetes mellitus with chronic kidney disease 06/14/2014  . Undiagnosed cardiac murmurs 06/13/2014  . CKD (chronic kidney disease) stage 3, GFR 30-59 ml/min 12/14/2013  . Edema 11/03/2012  . Routine general medical examination at a health care facility 07/28/2012  . Overweight (BMI 25.0-29.9) 04/27/2012  . Skin lesion of scalp 07/03/2011  . Insomnia 07/03/2011  . 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 Hypertension 09/28/2010  . Hyperlipidemia with target LDL less than 70 09/28/2010  . Coronary artery occlusion without or not resulting in myocardial infarction 09/28/2010  . Spinal stenosis of lumbar region 09/28/2010  . Screening for colon cancer 09/28/2010  . Neuropathy due to secondary diabetes 09/28/2010  . Peripheral vascular disease due to secondary diabetes 09/28/2010    Subjective:  CC:   Chief Complaint  Patient presents with  . Follow-up  . Diabetes    HPI:   [name] is a 81 y.o. male who presents for  3 month follow up on DM type 2 , controlled with CKD, hypertension, hyperlipidemia and history of CAD.  In  The interim his beloved wife of 31 years had declining health due to chronic respiratory failure and passed away last week with Hospice .  He has 3 children who are currently staying with him for emotional support.  "I never thought she would die before with , with all my problems."  Only sleeping 3 hours per night,  But this is chronic, and not changed.  Has been eating porroly due to being at her bedside during recurrent hospitalizations.  Was not  Not taking meds on a scheduled basis either fo =r the last 2 weeks. Marland Kitchen  He does some walking for exericse but is limited by  neuropathy of feet    Past Medical History  Diagnosis Date  . H/O: malaria 39    in [place]  . Hyperlipidemia   . Hypertension   . Diabetes mellitus   . Renal insufficiency     by Nov 2012 labs, no old records available  . Peripheral artery disease     folowed by Dew post bypass 2011 [place]  . Blood transfusion   . Coronary artery disease     h/o bypass coronary- 2001 & peripheral- 2011 , last full cardiac visit  in [place] , [place], [employer]    Past Surgical History  Procedure Laterality Date  . Spine surgery    . Coronary artery bypass graft  2001    4 vessel, done in [place]   . Pacemaker insertion    . Femoral bypass  2011    done in [place],  now followed by [facility]  . Insert / replace / remove pacemaker      2004  . Lumbar laminectomy/decompression microdiscectomy  01/16/2011    Procedure: LUMBAR LAMINECTOMY/DECOMPRESSION MICRODISCECTOMY;  Surgeon: Ophelia Charter;  Location: [facility];  Service: Neurosurgery;  Laterality: N/A;  Lumbar four and lumbar five laminectomies        The following portions of the patient's history were reviewed and updated as appropriate: Allergies, current medications, and problem list.    Review of Systems:   Patient denies headache, fevers, malaise, unintentional weight loss, skin rash,  eye pain, sinus congestion and sinus pain, sore throat, dysphagia,  hemoptysis , cough, dyspnea, wheezing, chest pain, palpitations, orthopnea, edema, abdominal pain, nausea, melena, diarrhea, constipation, flank pain, dysuria, hematuria, urinary  Frequency, nocturia, numbness, tingling, seizures,  Focal weakness, Loss of consciousness,  Tremor, insomnia, depression, anxiety, and suicidal ideation.     History   Social History  . Marital Status: Married    Spouse Name: Silva Bandy   . Number of Children: 3  . Years of Education: 16   Occupational History  . retired    Social History Main Topics  . Smoking status: Former Smoker     Quit date: 09/27/1985  . Smokeless tobacco: Never Used  . Alcohol Use: No  . Drug Use: No  . Sexual Activity: No   Other Topics Concern  . Not on file   Social History Narrative   Widowed, May 2016     Objective:  Filed Vitals:   06/13/14 1328  BP: 126/60  Pulse: 78  Temp: 98.4 F (36.9 C)  Resp: 14     General appearance: alert, cooperative and appears stated age Ears: normal TM's and external ear canals both ears Throat: lips, mucosa, and tongue normal; teeth and gums normal Neck: no adenopathy, no carotid bruit, supple, symmetrical, trachea midline and thyroid not enlarged, symmetric, no tenderness/mass/nodules Back: symmetric, no curvature. ROM normal. No CVA tenderness. Lungs: clear to auscultation bilaterally Heart: regular rate and rhythm, S1, S2 normal, no murmur, click, rub or gallop Abdomen: soft, non-tender; bowel sounds normal; no masses,  no organomegaly Pulses: 2+ and symmetric Skin: Skin color, texture, turgor normal. No rashes or lesions Lymph nodes: Cervical, supraclavicular, and axillary nodes normal.  Assessment and Plan:  Edema Pitting o exam taody,  Takes lasix 20 mg twice seekly ,;last dose yesterday,  No echo on chart from [facility].  Advised to increase to 3 times weekly and echo requested    Grief reaction Patient is dealing with the unexpected loss of spouse and has adequate coping skills and emotional support .  i have asked patient to return in one month to examine for signs of unresolving grief.    Undiagnosed cardiac murmurs Heard on exam today.  No echo in chart  Sees paraschos  Asymptomatic,  Does not want further workup     Diabetes mellitus with peripheral vascular disease . His diabetes is well-controlled and he is on angiotensin receptor blocker ,statin, and aspirin. He is not using NSAIDs.He does not desire nephrology follow p for CKD Lab Results  Component Value Date   HGBA1C 6.6* 06/13/2014    Lab Results  Component  Value Date   CREATININE 1.97* 06/13/2014   Lab Results  Component Value Date   MICROALBUR 2.7* 06/13/2014             Neuropathy due to secondary diabetes Stable symptoms,  Limiting his exercise due to daytime symptoms,  Recommended swimming.  Foot exam is normal except for decreased sensation.    Hyperlipidemia with target LDL less than 70 LDL and triglycerides are at goal on current medications. He has no side effects and liver enzymes are normal. No changes today   Lab Results  Component Value Date   CHOL 95 06/13/2014   HDL 31.60* 06/13/2014   LDLCALC 38 06/13/2014   LDLDIRECT 45.0 06/13/2014   TRIG 129.0 06/13/2014   CHOLHDL 3 06/13/2014   Lab Results  Component Value Date   ALT 16 06/13/2014   AST 20 06/13/2014   ALKPHOS  57 06/13/2014   BILITOT 0.4 06/13/2014      Diabetes mellitus with chronic kidney disease His creatinine  Has increased recently but is  stable. He does not desire nephrology followup. His diabetes is well-controlled and he is on angiotensin receptor blocker ,statin, and aspirin.  Lab Results  Component Value Date   CREATININE 1.97* 06/13/2014   Lab Results  Component Value Date   MICROALBUR 2.7* 06/13/2014              Updated Medication List Outpatient Encounter Prescriptions as of 06/13/2014  Medication Sig  . amLODipine (NORVASC) 10 MG tablet TAKE 1 TABLET (10 MG TOTAL) BY MOUTH DAILY.  Marland Kitchen aspirin 325 MG tablet Take 325 mg by mouth daily.    Marland Kitchen atorvastatin (LIPITOR) 20 MG tablet TAKE 1 TABLET (20 MG TOTAL) BY MOUTH DAILY.  Marland Kitchen B-D ULTRAFINE III SHORT PEN 31G X 8 MM MISC USE AS DIRECTED 3 TIMES A DAY  . Calcium Carbonate-Vitamin D (CALCIUM + D) 600-200 MG-UNIT TABS Take 1 tablet by mouth 2 (two) times daily.    . clopidogrel (PLAVIX) 75 MG tablet TAKE 1 TABLET (75 MG TOTAL) BY MOUTH DAILY.  . fenofibrate (TRICOR) 48 MG tablet TAKE 1 TABLET BY MOUTH DAILY  . furosemide (LASIX) 20 MG tablet One tablet  once or twice weekly  as needed for fluid retention  . glucose blood (ACCU-CHEK AVIVA PLUS) test strip Check blood sugar 4 to 5 times daily, and PRN Dx E11.51  . hydrocortisone 2.5 % cream Apply 1 application topically 2 (two) times daily as needed.  . Insulin Aspart Prot & Aspart (NOVOLOG 70/30 MIX) (70-30) 100 UNIT/ML Pen INJECT 24 UNITS  Before breakfast,.   24 units at lunch,  And 35 units before dinner as needed  . Insulin Syringe-Needle U-100 (B-D INS SYRINGE 0.5CC/31GX5/16) 31G X 5/16" 0.5 ML MISC 1 application by Does not apply route 3 (three) times daily before meals.  . metoprolol (LOPRESSOR) 50 MG tablet TAKE 1 TABLET BY MOUTH TWICE A DAY  . travoprost, benzalkonium, (TRAVATAN) 0.004 % ophthalmic solution Place 1 drop into both eyes at bedtime.   . valsartan (DIOVAN) 160 MG tablet TAKE 1 TABLET BY MOUTH DAILY   No facility-administered encounter medications on file as of 06/13/2014.   A total of 25 minutes of face to face time was spent with patient more than half of which was spent in counselling about the above mentioned conditions  and coordination of care   Orders Placed This Encounter  Procedures  . TSH  . Lipid panel  . Comprehensive metabolic panel  . Hemoglobin A1c  . LDL cholesterol, direct  . Microalbumin / creatinine urine ratio    Return in about 6 months (around 12/14/2014).

## 2014-06-13 NOTE — Patient Instructions (Addendum)
Please increase your lasix to three times weekly every other day  For now   If you have more trouble sleeping, please let me know

## 2014-06-13 NOTE — Assessment & Plan Note (Signed)
Pitting o exam taody,  Takes lasix 20 mg twice seekly ,;last dose yesterday,  No echo on chart from [facility].  Advised to increase to 3 times weekly and echo requested

## 2014-06-13 NOTE — Progress Notes (Signed)
Pre-visit discussion using our clinic review tool. No additional management support is needed unless otherwise documented below in the visit note.  

## 2014-06-14 ENCOUNTER — Encounter: Payer: Self-pay | Admitting: Internal Medicine

## 2014-06-14 DIAGNOSIS — F4320 Adjustment disorder, unspecified: Secondary | ICD-10-CM | POA: Insufficient documentation

## 2014-06-14 DIAGNOSIS — F4321 Adjustment disorder with depressed mood: Secondary | ICD-10-CM | POA: Insufficient documentation

## 2014-06-14 DIAGNOSIS — E1122 Type 2 diabetes mellitus with diabetic chronic kidney disease: Secondary | ICD-10-CM | POA: Insufficient documentation

## 2014-06-14 NOTE — Assessment & Plan Note (Addendum)
Heard on exam today.  No echo in chart  Sees paraschos  Asymptomatic,  Does not want further workup

## 2014-06-14 NOTE — Assessment & Plan Note (Signed)
LDL and triglycerides are at goal on current medications. He has no side effects and liver enzymes are normal. No changes today   Lab Results  Component Value Date   CHOL 95 06/13/2014   HDL 31.60* 06/13/2014   LDLCALC 38 06/13/2014   LDLDIRECT 45.0 06/13/2014   TRIG 129.0 06/13/2014   CHOLHDL 3 06/13/2014   Lab Results  Component Value Date   ALT 16 06/13/2014   AST 20 06/13/2014   ALKPHOS 57 06/13/2014   BILITOT 0.4 06/13/2014

## 2014-06-14 NOTE — Assessment & Plan Note (Signed)
His creatinine  Has increased recently but is  stable. He does not desire nephrology followup. His diabetes is well-controlled and he is on angiotensin receptor blocker ,statin, and aspirin.  Lab Results  Component Value Date   CREATININE 1.97* 06/13/2014   Lab Results  Component Value Date   MICROALBUR 2.7* 06/13/2014

## 2014-06-14 NOTE — Assessment & Plan Note (Signed)
.   His diabetes is well-controlled and he is on angiotensin receptor blocker ,statin, and aspirin. He is not using NSAIDs.He does not desire nephrology follow p for CKD Lab Results  Component Value Date   HGBA1C 6.6* 06/13/2014    Lab Results  Component Value Date   CREATININE 1.97* 06/13/2014   Lab Results  Component Value Date   MICROALBUR 2.7* 06/13/2014

## 2014-06-14 NOTE — Assessment & Plan Note (Addendum)
Stable symptoms,  Limiting his exercise due to daytime symptoms,  Recommended swimming.  Foot exam is normal except for decreased sensation.

## 2014-06-14 NOTE — Assessment & Plan Note (Signed)
Patient is dealing with the unexpected loss of spouse and has adequate coping skills and emotional support .  i have asked patient to return in one month to examine for signs of unresolving grief.  

## 2014-08-08 ENCOUNTER — Other Ambulatory Visit: Payer: Self-pay | Admitting: Internal Medicine

## 2014-08-09 DIAGNOSIS — I10 Essential (primary) hypertension: Secondary | ICD-10-CM | POA: Diagnosis not present

## 2014-08-09 DIAGNOSIS — E785 Hyperlipidemia, unspecified: Secondary | ICD-10-CM | POA: Diagnosis not present

## 2014-08-09 DIAGNOSIS — I739 Peripheral vascular disease, unspecified: Secondary | ICD-10-CM | POA: Diagnosis not present

## 2014-08-09 DIAGNOSIS — E119 Type 2 diabetes mellitus without complications: Secondary | ICD-10-CM | POA: Diagnosis not present

## 2014-08-18 ENCOUNTER — Other Ambulatory Visit: Payer: Self-pay | Admitting: Internal Medicine

## 2014-08-23 ENCOUNTER — Other Ambulatory Visit: Payer: Self-pay | Admitting: Internal Medicine

## 2014-08-29 ENCOUNTER — Other Ambulatory Visit: Payer: Self-pay | Admitting: Internal Medicine

## 2014-09-13 DIAGNOSIS — L570 Actinic keratosis: Secondary | ICD-10-CM | POA: Diagnosis not present

## 2014-09-13 DIAGNOSIS — L821 Other seborrheic keratosis: Secondary | ICD-10-CM | POA: Diagnosis not present

## 2014-09-13 DIAGNOSIS — X32XXXA Exposure to sunlight, initial encounter: Secondary | ICD-10-CM | POA: Diagnosis not present

## 2014-09-13 DIAGNOSIS — D485 Neoplasm of uncertain behavior of skin: Secondary | ICD-10-CM | POA: Diagnosis not present

## 2014-09-20 DIAGNOSIS — Z961 Presence of intraocular lens: Secondary | ICD-10-CM | POA: Diagnosis not present

## 2014-09-20 DIAGNOSIS — H4011X2 Primary open-angle glaucoma, moderate stage: Secondary | ICD-10-CM | POA: Diagnosis not present

## 2014-10-20 DIAGNOSIS — I739 Peripheral vascular disease, unspecified: Secondary | ICD-10-CM | POA: Diagnosis not present

## 2014-10-20 DIAGNOSIS — Z951 Presence of aortocoronary bypass graft: Secondary | ICD-10-CM | POA: Diagnosis not present

## 2014-10-20 DIAGNOSIS — I25810 Atherosclerosis of coronary artery bypass graft(s) without angina pectoris: Secondary | ICD-10-CM | POA: Diagnosis not present

## 2014-10-20 DIAGNOSIS — E119 Type 2 diabetes mellitus without complications: Secondary | ICD-10-CM | POA: Diagnosis not present

## 2014-10-20 DIAGNOSIS — I701 Atherosclerosis of renal artery: Secondary | ICD-10-CM | POA: Diagnosis not present

## 2014-10-20 DIAGNOSIS — I495 Sick sinus syndrome: Secondary | ICD-10-CM | POA: Diagnosis not present

## 2014-10-20 DIAGNOSIS — I10 Essential (primary) hypertension: Secondary | ICD-10-CM | POA: Diagnosis not present

## 2014-10-20 DIAGNOSIS — E780 Pure hypercholesterolemia: Secondary | ICD-10-CM | POA: Diagnosis not present

## 2014-10-23 ENCOUNTER — Encounter: Payer: Self-pay | Admitting: Internal Medicine

## 2014-10-23 ENCOUNTER — Other Ambulatory Visit: Payer: Self-pay | Admitting: *Deleted

## 2014-10-23 MED ORDER — FUROSEMIDE 20 MG PO TABS: ORAL_TABLET | ORAL | Status: DC

## 2014-11-15 DIAGNOSIS — L281 Prurigo nodularis: Secondary | ICD-10-CM | POA: Diagnosis not present

## 2014-11-24 ENCOUNTER — Other Ambulatory Visit: Payer: Self-pay | Admitting: Internal Medicine

## 2014-11-28 ENCOUNTER — Other Ambulatory Visit: Payer: Self-pay | Admitting: *Deleted

## 2014-11-28 MED ORDER — GLUCOSE BLOOD VI STRP: ORAL_STRIP | Status: DC

## 2014-11-28 NOTE — Progress Notes (Signed)
Reduced test strips from 400 to 270 to test CBGs 3 times daily.

## 2014-12-08 ENCOUNTER — Other Ambulatory Visit (INDEPENDENT_AMBULATORY_CARE_PROVIDER_SITE_OTHER): Payer: Medicare Other

## 2014-12-08 ENCOUNTER — Telehealth: Payer: Self-pay | Admitting: *Deleted

## 2014-12-08 DIAGNOSIS — E0822 Diabetes mellitus due to underlying condition with diabetic chronic kidney disease: Secondary | ICD-10-CM

## 2014-12-08 DIAGNOSIS — Z794 Long term (current) use of insulin: Secondary | ICD-10-CM | POA: Diagnosis not present

## 2014-12-08 DIAGNOSIS — N181 Chronic kidney disease, stage 1: Secondary | ICD-10-CM

## 2014-12-08 DIAGNOSIS — E785 Hyperlipidemia, unspecified: Secondary | ICD-10-CM

## 2014-12-08 DIAGNOSIS — N183 Chronic kidney disease, stage 3 unspecified: Secondary | ICD-10-CM

## 2014-12-08 LAB — LIPID PANEL
Cholesterol: 99 mg/dL (ref 0–200)
HDL: 35.6 mg/dL — ABNORMAL LOW (ref >39.00)
LDL Cholesterol: 45 mg/dL (ref 0–99)
NONHDL: 63.57
Total CHOL/HDL Ratio: 3
Triglycerides: 95 mg/dL (ref 0.0–149.0)
VLDL: 19 mg/dL (ref 0.0–40.0)

## 2014-12-08 LAB — COMPREHENSIVE METABOLIC PANEL
ALK PHOS: 49 U/L (ref 39–117)
ALT: 13 U/L (ref 0–53)
AST: 15 U/L (ref 0–37)
Albumin: 3.7 g/dL (ref 3.5–5.2)
BILIRUBIN TOTAL: 0.4 mg/dL (ref 0.2–1.2)
BUN: 27 mg/dL — AB (ref 6–23)
CO2: 28 mEq/L (ref 19–32)
Calcium: 9.5 mg/dL (ref 8.4–10.5)
Chloride: 109 mEq/L (ref 96–112)
Creatinine, Ser: 1.81 mg/dL — ABNORMAL HIGH (ref 0.40–1.50)
GFR: 37.77 mL/min — ABNORMAL LOW (ref >60.00)
GLUCOSE: 97 mg/dL (ref 70–99)
POTASSIUM: 4.6 meq/L (ref 3.5–5.1)
SODIUM: 144 meq/L (ref 135–145)
TOTAL PROTEIN: 6.2 g/dL (ref 6.0–8.3)

## 2014-12-08 LAB — LDL CHOLESTEROL, DIRECT: LDL DIRECT: 52 mg/dL

## 2014-12-08 LAB — HEMOGLOBIN A1C: Hgb A1c MFr Bld: 5.9 % (ref 4.6–6.5)

## 2014-12-08 NOTE — Telephone Encounter (Signed)
Labs and dx?  

## 2014-12-10 ENCOUNTER — Encounter: Payer: Self-pay | Admitting: Internal Medicine

## 2014-12-12 ENCOUNTER — Ambulatory Visit (INDEPENDENT_AMBULATORY_CARE_PROVIDER_SITE_OTHER): Payer: Medicare Other | Admitting: Internal Medicine

## 2014-12-12 ENCOUNTER — Encounter: Payer: Self-pay | Admitting: Internal Medicine

## 2014-12-12 VITALS — BP 128/64 | HR 71 | Temp 98.0°F | Resp 14 | Ht 67.5 in | Wt 190.6 lb

## 2014-12-12 DIAGNOSIS — E1151 Type 2 diabetes mellitus with diabetic peripheral angiopathy without gangrene: Secondary | ICD-10-CM | POA: Diagnosis not present

## 2014-12-12 DIAGNOSIS — Z23 Encounter for immunization: Secondary | ICD-10-CM | POA: Diagnosis not present

## 2014-12-12 DIAGNOSIS — E1340 Other specified diabetes mellitus with diabetic neuropathy, unspecified: Secondary | ICD-10-CM | POA: Diagnosis not present

## 2014-12-12 DIAGNOSIS — I10 Essential (primary) hypertension: Secondary | ICD-10-CM

## 2014-12-12 DIAGNOSIS — E1351 Other specified diabetes mellitus with diabetic peripheral angiopathy without gangrene: Secondary | ICD-10-CM

## 2014-12-12 NOTE — Patient Instructions (Addendum)
Your A1c is too low for your age,  Your goal is 7.0,  And you are at 5.9   Suspend your lunchtime dose of insulin for one week  Send me readings of the following times:  (max 3 times daily):   Pre lunch  Pre dinner Pre bed  Fastings  Lower carb choices to try , all can be found at [employer]   Try the Premier Protein chocolate shake instead of SlimFast  Low carb bread choices   Try Ezekiel bread  Or joseph's pita bread and Lavash  Bread   Instead of Beef Pot Pie:  Try the Atkins dinner ,entrees (frozen meal section )

## 2014-12-12 NOTE — Progress Notes (Signed)
Pre visit review using our clinic review tool, if applicable. No additional management support is needed unless otherwise documented below in the visit note. 

## 2014-12-12 NOTE — Progress Notes (Signed)
Subjective:  Patient ID: [name], male    DOB: February 18, 1924  Age: 81 y.o. MRN: [id number]  CC: The primary encounter diagnosis was Encounter for immunization. Diagnoses of Need for prophylactic vaccination against Streptococcus pneumoniae (pneumococcus), Diabetes mellitus with peripheral vascular disease ([place]), Essential hypertension, Neuropathy due to secondary diabetes ([place]), and Peripheral vascular disease due to secondary diabetes [facility]) were also pertinent to this visit.  HPI [name] presents for follow up on Type 2 DM with CKD, ,  Hyperlipidemia and hypertension.  Recent labs were done and reviewed with patient today.    He continues to  use mixed insulin three times daily at mealtime.   Using 70/30:  At thefollowing doses   24 units before breakfast,  24 units before lunch  and  35 units before dinner.  despite my warnings/advice. He Checks sugars 4 times daily.    Low blood sugars occurring  around lunch 2 or 3 times per week.   Pre dinner sugars are low from 70 to 90 . Diet reviewed:  Drinking Slim Fast for breakfast, alternating with toast made from Sugar Free bread with sugar free blackberry jam  Does not eat out muh.  Wife died in July 18, 2014.   Eats a lot of prepared meals.  Beef pot pie 2/week,  Filet of beef  Acc by [place] fries,  Eats 1/4 head of lettuce salad daily .  Bedtime sugars are taken at 2 am and  Are reported to be 140 to 150 .  Sleeps only 3-4 hours per night chronically.  Rarely naps during the day   Tyring to lose weight,  Lost 40 lbs 10 years ago on same diet,  Now he ca't despite reducing his calories,    Outpatient Prescriptions Prior to Visit  Medication Sig Dispense Refill  . amLODipine (NORVASC) 10 MG tablet TAKE 1 TABLET (10 MG TOTAL) BY MOUTH DAILY. 90 tablet 1  . aspirin 325 MG tablet Take 325 mg by mouth daily.      Marland Kitchen atorvastatin (LIPITOR) 20 MG tablet TAKE 1 TABLET (20 MG TOTAL) BY MOUTH DAILY. 90 tablet 1  . B-D ULTRAFINE III  SHORT PEN 31G X 8 MM MISC USE AS DIRECTED 3 TIMES A DAY 300 each 5  . Calcium Carbonate-Vitamin D (CALCIUM + D) 600-200 MG-UNIT TABS Take 1 tablet by mouth 2 (two) times daily.      . clopidogrel (PLAVIX) 75 MG tablet TAKE 1 TABLET (75 MG TOTAL) BY MOUTH DAILY. 90 tablet 1  . fenofibrate (TRICOR) 48 MG tablet TAKE 1 TABLET BY MOUTH DAILY 90 tablet 1  . furosemide (LASIX) 20 MG tablet TAKE 1 TABLET EVERY OTHER DAY FOR FLUID RETENTION 15 tablet 5  . glucose blood (ACCU-CHEK AVIVA PLUS) test strip Check blood sugar 3 times daily, and PRN Dx E11.51 270 each 3  . hydrocortisone 2.5 % cream Apply 1 application topically 2 (two) times daily as needed.    . Insulin Syringe-Needle U-100 (B-D INS SYRINGE 0.5CC/31GX5/16) 31G X 5/16" 0.5 ML MISC 1 application by Does not apply route 3 (three) times daily before meals. 300 each 3  . metoprolol (LOPRESSOR) 50 MG tablet TAKE 1 TABLET BY MOUTH TWICE A DAY 180 tablet 1  . travoprost, benzalkonium, (TRAVATAN) 0.004 % ophthalmic solution Place 1 drop into both eyes at bedtime.     . valsartan (DIOVAN) 160 MG tablet TAKE 1 TABLET BY MOUTH DAILY 90 tablet 1  . Insulin Aspart Prot & Aspart (NOVOLOG  70/30 MIX) (70-30) 100 UNIT/ML Pen INJECT 24 UNITS  Before breakfast,.   24 units at lunch,  And 35 units before dinner as needed 90 mL 3   No facility-administered medications prior to visit.    Review of Systems;  Patient denies headache, fevers, malaise, unintentional weight loss, skin rash, eye pain, sinus congestion and sinus pain, sore throat, dysphagia,  hemoptysis , cough, dyspnea, wheezing, chest pain, palpitations, orthopnea, edema, abdominal pain, nausea, melena, diarrhea, constipation, flank pain, dysuria, hematuria, urinary  Frequency, nocturia, numbness, tingling, seizures,  Focal weakness, Loss of consciousness,  Tremor, insomnia, depression, anxiety, and suicidal ideation.      Objective:  BP 128/64 mmHg  Pulse 71  Temp(Src) 98 F (36.7 C) (Oral)   Resp 14  Ht 5' 7.5" (1.715 m)  Wt 190 lb 9.6 oz (86.456 kg)  BMI 29.39 kg/m2  SpO2 98%  BP Readings from Last 3 Encounters:  12/12/14 128/64  06/13/14 126/60  12/13/13 118/62    Wt Readings from Last 3 Encounters:  12/12/14 190 lb 9.6 oz (86.456 kg)  06/13/14 191 lb 8 oz (86.864 kg)  12/13/13 191 lb (86.637 kg)    General appearance: alert, cooperative and appears stated age Ears: normal TM's and external ear canals both ears Throat: lips, mucosa, and tongue normal; teeth and gums normal Neck: no adenopathy, no carotid bruit, supple, symmetrical, trachea midline and thyroid not enlarged, symmetric, no tenderness/mass/nodules Back: symmetric, no curvature. ROM normal. No CVA tenderness. Lungs: clear to auscultation bilaterally Heart: regular rate and rhythm, S1, S2 normal, no murmur, click, rub or gallop Abdomen: soft, non-tender; bowel sounds normal; no masses,  no organomegaly Pulses: 2+ and symmetric Skin: Skin color, texture, turgor normal. No rashes or lesions Lymph nodes: Cervical, supraclavicular, and axillary nodes normal.  Lab Results  Component Value Date   HGBA1C 5.9 12/08/2014   HGBA1C 6.6* 06/13/2014   HGBA1C 6.4 12/10/2013    Lab Results  Component Value Date   CREATININE 1.81* 12/08/2014   CREATININE 1.97* 06/13/2014   CREATININE 2.1* 12/13/2013    Lab Results  Component Value Date   WBC 6.4 01/09/2011   HGB 13.0 01/09/2011   HCT 40.0 01/09/2011   PLT 215 01/09/2011   GLUCOSE 97 12/08/2014   CHOL 99 12/08/2014   TRIG 95.0 12/08/2014   HDL 35.60* 12/08/2014   LDLDIRECT 52.0 12/08/2014   LDLCALC 45 12/08/2014   ALT 13 12/08/2014   AST 15 12/08/2014   NA 144 12/08/2014   K 4.6 12/08/2014   CL 109 12/08/2014   CREATININE 1.81* 12/08/2014   BUN 27* 12/08/2014   CO2 28 12/08/2014   TSH 1.49 06/13/2014   HGBA1C 5.9 12/08/2014   MICROALBUR 2.7* 06/13/2014    Dg Chest 2 View  01/09/2011  *RADIOLOGY REPORT* Clinical Data: Preop back surgery.  CHEST - 2 VIEW Comparison: None. Findings: Post CABG.  Sequential pacemaker enters from the left with the tips at the level of the right atrium and right ventricle. Cardiomegaly. Central pulmonary vascular prominence. No infiltrate, congestive heart failure or pneumothorax. Calcified aorta.  Mild thoracic spine degenerative changes. IMPRESSION: Cardiomegaly with pacemaker in place. No infiltrate, congestive heart failure or pneumothorax. Original Report Authenticated By: Doug Sou, M.D.   Assessment & Plan:   Problem List Items Addressed This Visit    Diabetes mellitus with peripheral vascular disease ([place])    Frequent hypoglycemic events noted and discussed in light of aqc of 5.9 Advised to eliminate the lunchtime insulin since he  refuses to use short acting at that time.  Diet discussed and low carb protein drinks recommended instead of slimfast.  Lab Results  Component Value Date   HGBA1C 5.9 12/08/2014         Relevant Medications   insulin aspart protamine - aspart (NOVOLOG 70/30 MIX) (70-30) 100 UNIT/ML FlexPen   Hypertension    Well controlled on current regimen. Renal function stable, no changes today.  Lab Results  Component Value Date   CREATININE 1.81* 12/08/2014   Lab Results  Component Value Date   NA 144 12/08/2014   K 4.6 12/08/2014   CL 109 12/08/2014   CO2 28 12/08/2014         Neuropathy due to secondary diabetes ([place])    He does not walk barefoot outside and has well fitting shoes.  No ulcers or callouses on exam today.      Relevant Medications   insulin aspart protamine - aspart (NOVOLOG 70/30 MIX) (70-30) 100 UNIT/ML FlexPen   Peripheral vascular disease due to secondary diabetes ([place])    Feet are well perfused and pulses are palpable,  No claudication symptoms.       Relevant Medications   insulin aspart protamine - aspart (NOVOLOG 70/30 MIX) (70-30) 100 UNIT/ML FlexPen    Other Visit Diagnoses    Encounter for immunization    -  Primary      Need for prophylactic vaccination against Streptococcus pneumoniae (pneumococcus)        Relevant Orders    Pneumococcal polysaccharide vaccine 23-valent greater than or equal to 2yo subcutaneous/IM (Completed)       I have changed Mr. Zabawa's insulin aspart protamine - aspart. I am also having him maintain his aspirin, Calcium Carbonate-Vitamin D, Insulin Syringe-Needle U-100, travoprost (benzalkonium), B-D ULTRAFINE III SHORT PEN, hydrocortisone, metoprolol, clopidogrel, valsartan, atorvastatin, fenofibrate, furosemide, amLODipine, glucose blood, and latanoprost.  Meds ordered this encounter  Medications  . latanoprost (XALATAN) 0.005 % ophthalmic solution    Sig: Place 1 drop into both eyes at bedtime.    Refill:  3  . insulin aspart protamine - aspart (NOVOLOG 70/30 MIX) (70-30) 100 UNIT/ML FlexPen    Sig: INJECT 24 UNITS  Before breakfast,.  and 35 units before dinner adjust as as needed    Dispense:  90 mL    Refill:  3    NEEDS 6 BOXES PER MONTH    Medications Discontinued During This Encounter  Medication Reason  . Insulin Aspart Prot & Aspart (NOVOLOG 70/30 MIX) (70-30) 100 UNIT/ML Pen Reorder    Follow-up: No Follow-up on file.   Crecencio Mc, MD

## 2014-12-13 MED ORDER — INSULIN ASPART PROT & ASPART (70-30 MIX) 100 UNIT/ML PEN: PEN_INJECTOR | SUBCUTANEOUS | Status: DC

## 2014-12-13 NOTE — Assessment & Plan Note (Signed)
Well controlled on current regimen. Renal function stable, no changes today.  Lab Results  Component Value Date   CREATININE 1.81* 12/08/2014   Lab Results  Component Value Date   NA 144 12/08/2014   K 4.6 12/08/2014   CL 109 12/08/2014   CO2 28 12/08/2014

## 2014-12-13 NOTE — Assessment & Plan Note (Signed)
Frequent hypoglycemic events noted and discussed in light of aqc of 5.9 Advised to eliminate the lunchtime insulin since he refuses to use short acting at that time.  Diet discussed and low carb protein drinks recommended instead of slimfast.  Lab Results  Component Value Date   HGBA1C 5.9 12/08/2014

## 2014-12-13 NOTE — Assessment & Plan Note (Signed)
He does not walk barefoot outside and has well fitting shoes.  No ulcers or callouses on exam today.

## 2014-12-13 NOTE — Assessment & Plan Note (Signed)
Feet are well perfused and pulses are palpable,  No claudication symptoms.

## 2014-12-21 ENCOUNTER — Telehealth: Payer: Self-pay | Admitting: Internal Medicine

## 2014-12-21 NOTE — Telephone Encounter (Signed)
Results put in folder for review.

## 2014-12-21 NOTE — Telephone Encounter (Signed)
Pt stopped by and dropped off his A1C levels.. Placed in Dr. Derrel Nip box. Please advise pt with any questions

## 2014-12-26 ENCOUNTER — Encounter: Payer: Self-pay | Admitting: Internal Medicine

## 2014-12-26 NOTE — Telephone Encounter (Signed)
Called and advised patient a to written order by MD to decrease morning by 2 units and to increase evening dose by 2 units and to check am fasting and pre-lunch and report in one month.

## 2015-01-24 DIAGNOSIS — H401112 Primary open-angle glaucoma, right eye, moderate stage: Secondary | ICD-10-CM | POA: Diagnosis not present

## 2015-01-24 DIAGNOSIS — Z961 Presence of intraocular lens: Secondary | ICD-10-CM | POA: Diagnosis not present

## 2015-01-24 DIAGNOSIS — H52223 Regular astigmatism, bilateral: Secondary | ICD-10-CM | POA: Diagnosis not present

## 2015-02-07 ENCOUNTER — Telehealth: Payer: Self-pay | Admitting: Internal Medicine

## 2015-02-07 DIAGNOSIS — E1151 Type 2 diabetes mellitus with diabetic peripheral angiopathy without gangrene: Secondary | ICD-10-CM

## 2015-02-07 NOTE — Telephone Encounter (Signed)
Pt came in an dropped off his A1C level.. Placed in Dr. Demetrios Isaacs box..please advise pt if any questions

## 2015-02-08 NOTE — Telephone Encounter (Signed)
In results folder. 

## 2015-02-09 NOTE — Telephone Encounter (Signed)
Patient notified and lab appointment scheduled and patient voiced understanding to new dosage regimen.

## 2015-02-09 NOTE — Telephone Encounter (Signed)
He dropped off a log of his  blood sugars which reflect recurrent hypoglycemia before lunch ( 9 episodes ) and  7 episodes of higher than normal sugars at bedtime.  All others are acceptable.    So  He should reduce his morning dose to 20 units before breakfast  And increase his pre dinner dose to 40 units before his evening meal.   Please remind him that at his age,  Low blood sugars (<80) can lead to coma and death, where as occasional high blood sugars will not affect him at all.     Next A1c is due on or after February 3 and has been ordered. nonfasting labs   Lab Results  Component Value Date   HGBA1C 5.9 12/08/2014

## 2015-02-15 ENCOUNTER — Other Ambulatory Visit: Payer: Self-pay | Admitting: Internal Medicine

## 2015-02-19 ENCOUNTER — Other Ambulatory Visit: Payer: Self-pay | Admitting: Internal Medicine

## 2015-02-24 ENCOUNTER — Other Ambulatory Visit: Payer: Self-pay

## 2015-02-24 ENCOUNTER — Telehealth: Payer: Self-pay | Admitting: Internal Medicine

## 2015-02-24 MED ORDER — INSULIN ASPART PROT & ASPART (70-30 MIX) 100 UNIT/ML PEN: PEN_INJECTOR | SUBCUTANEOUS | Status: DC

## 2015-02-24 NOTE — Telephone Encounter (Signed)
Rx request has been filled

## 2015-02-24 NOTE — Telephone Encounter (Signed)
Pt called needing a refill insulin aspart protamine - aspart (NOVOLOG 70/30 MIX) (70-30) 100 UNIT/ML FlexPen. Pt only has one pin left. Pharmacy is [id number] - [place], [place]. Call pt @ [phone]. Thank You!

## 2015-02-27 ENCOUNTER — Other Ambulatory Visit: Payer: Self-pay

## 2015-02-27 ENCOUNTER — Encounter: Payer: Self-pay | Admitting: Internal Medicine

## 2015-02-27 ENCOUNTER — Telehealth: Payer: Self-pay | Admitting: *Deleted

## 2015-02-27 MED ORDER — INSULIN ASPART PROT & ASPART (70-30 MIX) 100 UNIT/ML PEN: PEN_INJECTOR | SUBCUTANEOUS | Status: DC

## 2015-02-27 NOTE — Telephone Encounter (Signed)
Patient has requested a medication refill for Novolog 70-30 [facility]acy [facility] [address]

## 2015-02-27 NOTE — Telephone Encounter (Signed)
Pt emailed Dr Derrel Nip this morning, but he called back in stating he needs his medication immediately he may be running out. I did tell him she may not had a time to read it yet being that he just emailed her at 9:15am. See email below. Thank you!

## 2015-03-13 ENCOUNTER — Other Ambulatory Visit (INDEPENDENT_AMBULATORY_CARE_PROVIDER_SITE_OTHER): Payer: Medicare Other

## 2015-03-13 DIAGNOSIS — E785 Hyperlipidemia, unspecified: Secondary | ICD-10-CM | POA: Diagnosis not present

## 2015-03-13 DIAGNOSIS — E1151 Type 2 diabetes mellitus with diabetic peripheral angiopathy without gangrene: Secondary | ICD-10-CM

## 2015-03-13 LAB — COMPREHENSIVE METABOLIC PANEL
ALT: 14 U/L (ref 0–53)
AST: 16 U/L (ref 0–37)
Albumin: 4.1 g/dL (ref 3.5–5.2)
Alkaline Phosphatase: 52 U/L (ref 39–117)
BILIRUBIN TOTAL: 0.5 mg/dL (ref 0.2–1.2)
BUN: 38 mg/dL — ABNORMAL HIGH (ref 6–23)
CALCIUM: 9.6 mg/dL (ref 8.4–10.5)
CO2: 26 meq/L (ref 19–32)
CREATININE: 1.89 mg/dL — AB (ref 0.40–1.50)
Chloride: 110 mEq/L (ref 96–112)
GFR: 35.91 mL/min — ABNORMAL LOW (ref >60.00)
Glucose, Bld: 99 mg/dL (ref 70–99)
Potassium: 4.8 mEq/L (ref 3.5–5.1)
Sodium: 143 mEq/L (ref 135–145)
Total Protein: 7 g/dL (ref 6.0–8.3)

## 2015-03-13 LAB — MICROALBUMIN / CREATININE URINE RATIO
Creatinine,U: 24.9 mg/dL
MICROALB UR: 1.2 mg/dL (ref 0.0–1.9)
Microalb Creat Ratio: 4.8 mg/g (ref 0.0–30.0)

## 2015-03-13 LAB — HEMOGLOBIN A1C: Hgb A1c MFr Bld: 6.6 % — ABNORMAL HIGH (ref 4.6–6.5)

## 2015-03-13 LAB — LDL CHOLESTEROL, DIRECT: Direct LDL: 50 mg/dL

## 2015-03-18 ENCOUNTER — Encounter: Payer: Self-pay | Admitting: Internal Medicine

## 2015-03-23 ENCOUNTER — Other Ambulatory Visit: Payer: Self-pay | Admitting: Internal Medicine

## 2015-03-24 ENCOUNTER — Encounter: Payer: Self-pay | Admitting: Internal Medicine

## 2015-03-27 ENCOUNTER — Encounter: Payer: Self-pay | Admitting: Internal Medicine

## 2015-03-28 NOTE — Telephone Encounter (Signed)
Patient has been getting 15 pen for 90 day supply can we send?

## 2015-03-29 MED ORDER — INSULIN ASPART PROT & ASPART (70-30 MIX) 100 UNIT/ML PEN: PEN_INJECTOR | SUBCUTANEOUS | Status: DC

## 2015-03-29 NOTE — Telephone Encounter (Signed)
Prior need was  6 boxes per his e mail.   But rx revised to reflect his new reduced daily use of 60 units per day.   see mychart message,   So that's 18 pens or 4 boxes needed for 90 day supply. Sent to pharmacy

## 2015-04-13 DIAGNOSIS — I495 Sick sinus syndrome: Secondary | ICD-10-CM | POA: Diagnosis not present

## 2015-04-19 DIAGNOSIS — I2510 Atherosclerotic heart disease of native coronary artery without angina pectoris: Secondary | ICD-10-CM | POA: Diagnosis not present

## 2015-04-19 DIAGNOSIS — Z951 Presence of aortocoronary bypass graft: Secondary | ICD-10-CM | POA: Diagnosis not present

## 2015-04-19 DIAGNOSIS — I10 Essential (primary) hypertension: Secondary | ICD-10-CM | POA: Diagnosis not present

## 2015-04-19 DIAGNOSIS — E7800 Pure hypercholesterolemia, unspecified: Secondary | ICD-10-CM | POA: Diagnosis not present

## 2015-04-19 DIAGNOSIS — I701 Atherosclerosis of renal artery: Secondary | ICD-10-CM | POA: Diagnosis not present

## 2015-04-19 DIAGNOSIS — E119 Type 2 diabetes mellitus without complications: Secondary | ICD-10-CM | POA: Diagnosis not present

## 2015-04-19 DIAGNOSIS — I739 Peripheral vascular disease, unspecified: Secondary | ICD-10-CM | POA: Diagnosis not present

## 2015-04-24 DIAGNOSIS — T82858A Stenosis of vascular prosthetic devices, implants and grafts, initial encounter: Secondary | ICD-10-CM | POA: Diagnosis not present

## 2015-04-24 DIAGNOSIS — I10 Essential (primary) hypertension: Secondary | ICD-10-CM | POA: Diagnosis not present

## 2015-04-24 DIAGNOSIS — N186 End stage renal disease: Secondary | ICD-10-CM | POA: Diagnosis not present

## 2015-04-24 DIAGNOSIS — E785 Hyperlipidemia, unspecified: Secondary | ICD-10-CM | POA: Diagnosis not present

## 2015-04-24 DIAGNOSIS — Z992 Dependence on renal dialysis: Secondary | ICD-10-CM | POA: Diagnosis not present

## 2015-04-24 DIAGNOSIS — E119 Type 2 diabetes mellitus without complications: Secondary | ICD-10-CM | POA: Diagnosis not present

## 2015-04-24 DIAGNOSIS — I739 Peripheral vascular disease, unspecified: Secondary | ICD-10-CM | POA: Diagnosis not present

## 2015-05-09 DIAGNOSIS — E119 Type 2 diabetes mellitus without complications: Secondary | ICD-10-CM | POA: Diagnosis not present

## 2015-05-09 DIAGNOSIS — H52223 Regular astigmatism, bilateral: Secondary | ICD-10-CM | POA: Diagnosis not present

## 2015-05-09 DIAGNOSIS — Z961 Presence of intraocular lens: Secondary | ICD-10-CM | POA: Diagnosis not present

## 2015-05-11 ENCOUNTER — Other Ambulatory Visit: Payer: Self-pay | Admitting: Internal Medicine

## 2015-05-18 ENCOUNTER — Other Ambulatory Visit: Payer: Self-pay | Admitting: Internal Medicine

## 2015-05-18 NOTE — Telephone Encounter (Signed)
Rx refill sent to pharmacy. 

## 2015-06-09 ENCOUNTER — Encounter: Payer: Self-pay | Admitting: Internal Medicine

## 2015-06-10 MED ORDER — GLUCOSE BLOOD VI STRP: ORAL_STRIP | Status: DC

## 2015-06-26 ENCOUNTER — Encounter: Payer: Self-pay | Admitting: Internal Medicine

## 2015-06-26 ENCOUNTER — Ambulatory Visit (INDEPENDENT_AMBULATORY_CARE_PROVIDER_SITE_OTHER): Payer: Medicare Other | Admitting: Internal Medicine

## 2015-06-26 VITALS — BP 132/68 | HR 85 | Temp 97.5°F | Resp 12 | Ht 67.5 in | Wt 186.2 lb

## 2015-06-26 DIAGNOSIS — R601 Generalized edema: Secondary | ICD-10-CM

## 2015-06-26 DIAGNOSIS — Z79899 Other long term (current) drug therapy: Secondary | ICD-10-CM

## 2015-06-26 DIAGNOSIS — E785 Hyperlipidemia, unspecified: Secondary | ICD-10-CM | POA: Diagnosis not present

## 2015-06-26 DIAGNOSIS — Z794 Long term (current) use of insulin: Secondary | ICD-10-CM

## 2015-06-26 DIAGNOSIS — E1121 Type 2 diabetes mellitus with diabetic nephropathy: Secondary | ICD-10-CM

## 2015-06-26 DIAGNOSIS — E1340 Other specified diabetes mellitus with diabetic neuropathy, unspecified: Secondary | ICD-10-CM | POA: Diagnosis not present

## 2015-06-26 LAB — COMPREHENSIVE METABOLIC PANEL
ALBUMIN: 4.1 g/dL (ref 3.5–5.2)
ALK PHOS: 54 U/L (ref 39–117)
ALT: 12 U/L (ref 0–53)
AST: 13 U/L (ref 0–37)
BILIRUBIN TOTAL: 0.6 mg/dL (ref 0.2–1.2)
BUN: 34 mg/dL — AB (ref 6–23)
CALCIUM: 9.7 mg/dL (ref 8.4–10.5)
CO2: 25 mEq/L (ref 19–32)
CREATININE: 1.55 mg/dL — AB (ref 0.40–1.50)
Chloride: 110 mEq/L (ref 96–112)
GFR: 45.11 mL/min — ABNORMAL LOW (ref >60.00)
Glucose, Bld: 129 mg/dL — ABNORMAL HIGH (ref 70–99)
Potassium: 4.2 mEq/L (ref 3.5–5.1)
SODIUM: 142 meq/L (ref 135–145)
TOTAL PROTEIN: 6.4 g/dL (ref 6.0–8.3)

## 2015-06-26 LAB — LDL CHOLESTEROL, DIRECT: LDL DIRECT: 49 mg/dL

## 2015-06-26 LAB — VITAMIN B12: Vitamin B-12: 206 pg/mL — ABNORMAL LOW (ref 211–911)

## 2015-06-26 LAB — HEMOGLOBIN A1C: HEMOGLOBIN A1C: 6.9 % — AB (ref 4.6–6.5)

## 2015-06-26 NOTE — Patient Instructions (Addendum)
Reduce blood sugar checks to pre lunch and bedtime   We can use gabapentin for your foot pain is it gets bad enough   try using the pool for exercise so your feet don't bear the brunt of your weight  Gabapentin capsules or tablets What is this medicine? GABAPENTIN (GA ba pen tin) is used to control partial seizures in adults with epilepsy. It is also used to treat certain types of nerve pain. This medicine may be used for other purposes; ask your health care provider or pharmacist if you have questions. What should I tell my health care provider before I take this medicine? They need to know if you have any of these conditions: -kidney disease -suicidal thoughts, plans, or attempt; a previous suicide attempt by you or a family member -an unusual or allergic reaction to gabapentin, other medicines, foods, dyes, or preservatives -pregnant or trying to get pregnant -breast-feeding How should I use this medicine? Take this medicine by mouth with a glass of water. Follow the directions on the prescription label. You can take it with or without food. If it upsets your stomach, take it with food.Take your medicine at regular intervals. Do not take it more often than directed. Do not stop taking except on your doctor's advice. If you are directed to break the 600 or 800 mg tablets in half as part of your dose, the extra half tablet should be used for the next dose. If you have not used the extra half tablet within 28 days, it should be thrown away. A special MedGuide will be given to you by the pharmacist with each prescription and refill. Be sure to read this information carefully each time. Talk to your pediatrician regarding the use of this medicine in children. Special care may be needed. Overdosage: If you think you have taken too much of this medicine contact a poison control center or emergency room at once. NOTE: This medicine is only for you. Do not share this medicine with others. What if I  miss a dose? If you miss a dose, take it as soon as you can. If it is almost time for your next dose, take only that dose. Do not take double or extra doses. What may interact with this medicine? Do not take this medicine with any of the following medications: -other gabapentin products This medicine may also interact with the following medications: -alcohol -antacids -antihistamines for allergy, cough and cold -certain medicines for anxiety or sleep -certain medicines for depression or psychotic disturbances -homatropine; hydrocodone -naproxen -narcotic medicines (opiates) for pain -phenothiazines like chlorpromazine, mesoridazine, prochlorperazine, thioridazine This list may not describe all possible interactions. Give your health care provider a list of all the medicines, herbs, non-prescription drugs, or dietary supplements you use. Also tell them if you smoke, drink alcohol, or use illegal drugs. Some items may interact with your medicine. What should I watch for while using this medicine? Visit your doctor or health care professional for regular checks on your progress. You may want to keep a record at home of how you feel your condition is responding to treatment. You may want to share this information with your doctor or health care professional at each visit. You should contact your doctor or health care professional if your seizures get worse or if you have any new types of seizures. Do not stop taking this medicine or any of your seizure medicines unless instructed by your doctor or health care professional. Stopping your medicine suddenly can  increase your seizures or their severity. Wear a medical identification bracelet or chain if you are taking this medicine for seizures, and carry a card that lists all your medications. You may get drowsy, dizzy, or have blurred vision. Do not drive, use machinery, or do anything that needs mental alertness until you know how this medicine affects  you. To reduce dizzy or fainting spells, do not sit or stand up quickly, especially if you are an older patient. Alcohol can increase drowsiness and dizziness. Avoid alcoholic drinks. Your mouth may get dry. Chewing sugarless gum or sucking hard candy, and drinking plenty of water will help. The use of this medicine may increase the chance of suicidal thoughts or actions. Pay special attention to how you are responding while on this medicine. Any worsening of mood, or thoughts of suicide or dying should be reported to your health care professional right away. Women who become pregnant while using this medicine may enroll in the [place] Pregnancy Registry by calling [phone]. This registry collects information about the safety of antiepileptic drug use during pregnancy. What side effects may I notice from receiving this medicine? Side effects that you should report to your doctor or health care professional as soon as possible: -allergic reactions like skin rash, itching or hives, swelling of the face, lips, or tongue -worsening of mood, thoughts or actions of suicide or dying Side effects that usually do not require medical attention (report to your doctor or health care professional if they continue or are bothersome): -constipation -difficulty walking or controlling muscle movements -dizziness -nausea -slurred speech -tiredness -tremors -weight gain This list may not describe all possible side effects. Call your doctor for medical advice about side effects. You may report side effects to FDA at 1-800-FDA-1088. Where should I keep my medicine? Keep out of reach of children. This medicine may cause accidental overdose and death if it taken by other adults, children, or pets. Mix any unused medicine with a substance like cat litter or coffee grounds. Then throw the medicine away in a sealed container like a sealed bag or a coffee can with a lid. Do not use the  medicine after the expiration date. Store at room temperature between 15 and 30 degrees C (59 and 86 degrees F). NOTE: This sheet is a summary. It may not cover all possible information. If you have questions about this medicine, talk to your doctor, pharmacist, or health care provider.    2016, Elsevier/Gold Standard. (2013-03-19 15:26:50)

## 2015-06-26 NOTE — Progress Notes (Signed)
Subjective:  Patient ID: [name], male    DOB: 10-05-1924  Age: 82 y.o. MRN: [id number]  CC: The primary encounter diagnosis was Type 2 diabetes mellitus with diabetic nephropathy, with long-term current use of insulin ([place]). Diagnoses of Hyperlipidemia with target LDL less than 70, Long-term use of high-risk medication, Neuropathy due to secondary diabetes ([place]), and Generalized edema were also pertinent to this visit.  HPI [name] presents for follow up on Type 2 DM controlled with mixed insulin, and CKD , stable (no referral per patient ) .  He continues to check his  sugars 4 times daily habitually .  Fasting and pre lunch BS are all normal.  Occasional elevations at dinner and bedtime.  Rare lows.     Lab Results  Component Value Date   HGBA1C 6.9* 06/26/2015   Lab Results  Component Value Date   CREATININE 1.55* 06/26/2015    feet have been bothering him lately  due to neuropathy. Only bothers him when he uses his feet. Interfering with exercise.    Upset about a billing mistake that [facility] made , charged him for a surgical service on March 20th,  No such visit occurred .Marland Kitchen Has made several calls and gets told it has been rectified,  But continues to get phone calls from  [place].      Outpatient Prescriptions Prior to Visit  Medication Sig Dispense Refill  . amLODipine (NORVASC) 10 MG tablet TAKE 1 TABLET (10 MG TOTAL) BY MOUTH DAILY. 90 tablet 2  . aspirin 325 MG tablet Take 325 mg by mouth daily.      Marland Kitchen atorvastatin (LIPITOR) 20 MG tablet TAKE 1 TABLET (20 MG TOTAL) BY MOUTH DAILY. 90 tablet 1  . B-D ULTRAFINE III SHORT PEN 31G X 8 MM MISC USE AS DIRECTED 3 TIMES A DAY 300 each 5  . Calcium Carbonate-Vitamin D (CALCIUM + D) 600-200 MG-UNIT TABS Take 1 tablet by mouth 2 (two) times daily.      . clopidogrel (PLAVIX) 75 MG tablet TAKE 1 TABLET (75 MG TOTAL) BY MOUTH DAILY. 90 tablet 1  . fenofibrate (TRICOR) 48 MG tablet TAKE 1 TABLET  BY MOUTH DAILY 90 tablet 1  . furosemide (LASIX) 20 MG tablet TAKE 1 TABLET EVERY OTHER DAY FOR FLUID RETENTION 15 tablet 5  . [START ON 07/11/2015] glucose blood (ACCU-CHEK AVIVA PLUS) test strip Check blood sugar 4 times daily, and PRN Dx E11.51 360 each 3  . hydrocortisone 2.5 % cream Apply 1 application topically 2 (two) times daily as needed.    . insulin aspart protamine - aspart (NOVOLOG MIX 70/30 FLEXPEN) (70-30) 100 UNIT/ML FlexPen 21 units in the am and 41 units in the pm pre meal 20 pen 1  . Insulin Syringe-Needle U-100 (B-D INS SYRINGE 0.5CC/31GX5/16) 31G X 5/16" 0.5 ML MISC 1 application by Does not apply route 3 (three) times daily before meals. 300 each 3  . latanoprost (XALATAN) 0.005 % ophthalmic solution Place 1 drop into both eyes at bedtime.  3  . metoprolol (LOPRESSOR) 50 MG tablet TAKE 1 TABLET BY MOUTH TWICE A DAY 180 tablet 1  . travoprost, benzalkonium, (TRAVATAN) 0.004 % ophthalmic solution Place 1 drop into both eyes at bedtime.     . valsartan (DIOVAN) 160 MG tablet TAKE 1 TABLET BY MOUTH DAILY 90 tablet 1   No facility-administered medications prior to visit.    Review of Systems;  Patient denies headache, fevers, malaise, unintentional  weight loss, skin rash, eye pain, sinus congestion and sinus pain, sore throat, dysphagia,  hemoptysis , cough, dyspnea, wheezing, chest pain, palpitations, orthopnea, edema, abdominal pain, nausea, melena, diarrhea, constipation, flank pain, dysuria, hematuria, urinary  Frequency, nocturia, numbness, tingling, seizures,  Focal weakness, Loss of consciousness,  Tremor, insomnia, depression, anxiety, and suicidal ideation.      Objective:  BP 132/68 mmHg  Pulse 85  Temp(Src) 97.5 F (36.4 C) (Oral)  Resp 12  Ht 5' 7.5" (1.715 m)  Wt 186 lb 4 oz (84.482 kg)  BMI 28.72 kg/m2  SpO2 97%  BP Readings from Last 3 Encounters:  06/26/15 132/68  12/12/14 128/64  06/13/14 126/60    Wt Readings from Last 3 Encounters:  06/26/15 186  lb 4 oz (84.482 kg)  12/12/14 190 lb 9.6 oz (86.456 kg)  06/13/14 191 lb 8 oz (86.864 kg)    General appearance: alert, cooperative and appears stated age Ears: normal TM's and external ear canals both ears Throat: lips, mucosa, and tongue normal; teeth and gums normal Neck: no adenopathy, no carotid bruit, supple, symmetrical, trachea midline and thyroid not enlarged, symmetric, no tenderness/mass/nodules Back: symmetric, no curvature. ROM normal. No CVA tenderness. Lungs: clear to auscultation bilaterally Heart: regular rate and rhythm, S1, S2 normal, no murmur, click, rub or gallop Abdomen: soft, non-tender; bowel sounds normal; no masses,  no organomegaly Pulses: 2+ and symmetric Skin: Skin color, texture, turgor normal. No rashes or lesions Lymph nodes: Cervical, supraclavicular, and axillary nodes normal.  Lab Results  Component Value Date   HGBA1C 6.9* 06/26/2015   HGBA1C 6.6* 03/13/2015   HGBA1C 5.9 12/08/2014    Lab Results  Component Value Date   CREATININE 1.55* 06/26/2015   CREATININE 1.89* 03/13/2015   CREATININE 1.81* 12/08/2014    Lab Results  Component Value Date   WBC 6.4 01/09/2011   HGB 13.0 01/09/2011   HCT 40.0 01/09/2011   PLT 215 01/09/2011   GLUCOSE 129* 06/26/2015   CHOL 99 12/08/2014   TRIG 95.0 12/08/2014   HDL 35.60* 12/08/2014   LDLDIRECT 49.0 06/26/2015   LDLCALC 45 12/08/2014   ALT 12 06/26/2015   AST 13 06/26/2015   NA 142 06/26/2015   K 4.2 06/26/2015   CL 110 06/26/2015   CREATININE 1.55* 06/26/2015   BUN 34* 06/26/2015   CO2 25 06/26/2015   TSH 1.49 06/13/2014   HGBA1C 6.9* 06/26/2015   MICROALBUR 1.2 03/13/2015     Assessment & Plan:   Problem List Items Addressed This Visit    Neuropathy due to secondary diabetes ([place])    Trial of gabapentin starting at 100 mg tid offered but deferred for now       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  DM still very well controlled for age and risks of adverse events lowered with slight loosening of control. Can reduce checks to twice daily given cost of test strips and consistently well controlled DM   Diet discussed and low carb protein drinks recommended instead of slimfast.  Lab Results  Component Value Date   HGBA1C 6.9* 06/26/2015           Relevant Orders   Comprehensive metabolic panel (Completed)   Hemoglobin A1c (Completed)   Edema    Controlled with prn lasix.       Hyperlipidemia with target LDL less than 70   Relevant Orders  LDL cholesterol, direct (Completed)    Other Visit Diagnoses    Long-term use of high-risk medication        Relevant Orders    B12 (Completed)     A total of 40 minutes of face to face time was spent with patient more than half of which was spent in counselling and coordination of care    I am having [name] maintain his aspirin, Calcium Carbonate-Vitamin D, Insulin Syringe-Needle U-100, travoprost (benzalkonium), B-D ULTRAFINE III SHORT PEN, hydrocortisone, latanoprost, clopidogrel, metoprolol, valsartan, atorvastatin, fenofibrate, insulin aspart protamine - aspart, furosemide, amLODipine, and glucose blood.  No orders of the defined types were placed in this encounter.    There are no discontinued medications.  Follow-up: Return in about 6 months (around 12/27/2015) for follow up diabetes.   Crecencio Mc, MD

## 2015-06-26 NOTE — Progress Notes (Signed)
Pre-visit discussion using our clinic review tool. No additional management support is needed unless otherwise documented below in the visit note.  

## 2015-06-27 NOTE — Assessment & Plan Note (Addendum)
Resolution of hypoglycemic events resulting from elimination of  lunchtime insulin.  DM still very well controlled for age and risks of adverse events lowered with slight loosening of control. Can reduce checks to twice daily given cost of test strips and consistently well controlled DM   Diet discussed and low carb protein drinks recommended instead of slimfast.  Lab Results  Component Value Date   HGBA1C 6.9* 06/26/2015

## 2015-06-27 NOTE — Assessment & Plan Note (Signed)
Controlled with prn lasix.

## 2015-06-27 NOTE — Assessment & Plan Note (Signed)
Trial of gabapentin starting at 100 mg tid offered but deferred for now

## 2015-07-04 ENCOUNTER — Ambulatory Visit (INDEPENDENT_AMBULATORY_CARE_PROVIDER_SITE_OTHER): Payer: Medicare Other | Admitting: *Deleted

## 2015-07-04 DIAGNOSIS — E538 Deficiency of other specified B group vitamins: Secondary | ICD-10-CM | POA: Diagnosis not present

## 2015-07-04 MED ORDER — CYANOCOBALAMIN 1000 MCG/ML IJ SOLN
1000.0000 ug | Freq: Once | INTRAMUSCULAR | Status: AC
  Administered 2015-07-04: 1000 ug via INTRAMUSCULAR

## 2015-07-11 DIAGNOSIS — Z961 Presence of intraocular lens: Secondary | ICD-10-CM | POA: Diagnosis not present

## 2015-07-11 DIAGNOSIS — H401131 Primary open-angle glaucoma, bilateral, mild stage: Secondary | ICD-10-CM | POA: Diagnosis not present

## 2015-07-12 ENCOUNTER — Ambulatory Visit (INDEPENDENT_AMBULATORY_CARE_PROVIDER_SITE_OTHER): Payer: Medicare Other | Admitting: Surgical

## 2015-07-12 DIAGNOSIS — E538 Deficiency of other specified B group vitamins: Secondary | ICD-10-CM

## 2015-07-12 MED ORDER — CYANOCOBALAMIN 1000 MCG/ML IJ SOLN
1000.0000 ug | Freq: Once | INTRAMUSCULAR | Status: AC
  Administered 2015-07-12: 1000 ug via INTRAMUSCULAR

## 2015-07-12 NOTE — Progress Notes (Signed)
B 12 injection given in right deltoid. Patient tolerated well 

## 2015-07-19 ENCOUNTER — Telehealth: Payer: Self-pay | Admitting: *Deleted

## 2015-07-19 ENCOUNTER — Ambulatory Visit (INDEPENDENT_AMBULATORY_CARE_PROVIDER_SITE_OTHER): Payer: Medicare Other | Admitting: *Deleted

## 2015-07-19 DIAGNOSIS — E538 Deficiency of other specified B group vitamins: Secondary | ICD-10-CM

## 2015-07-19 MED ORDER — CYANOCOBALAMIN 1000 MCG/ML IJ SOLN
1000.0000 ug | Freq: Once | INTRAMUSCULAR | Status: AC
  Administered 2015-07-19: 1000 ug via INTRAMUSCULAR

## 2015-07-19 MED ORDER — SYRINGE (DISPOSABLE) 3 ML MISC: Status: DC

## 2015-07-19 MED ORDER — CYANOCOBALAMIN 1000 MCG/ML IJ SOLN: 1000.0000 ug | INTRAMUSCULAR | Status: DC

## 2015-07-19 NOTE — Telephone Encounter (Signed)
Pt came in today for his last weekly B12 injection (3 of 3). He would like to give monthly injections at home. He does his own insulin injections & is familiar with using the leg. Pt understands that B12 is IM unlike insulin as well.  [facility] send in medication, syringe, & [facility] [address]

## 2015-07-19 NOTE — Telephone Encounter (Signed)
done

## 2015-08-11 DIAGNOSIS — I70213 Atherosclerosis of native arteries of extremities with intermittent claudication, bilateral legs: Secondary | ICD-10-CM | POA: Diagnosis not present

## 2015-08-11 DIAGNOSIS — I739 Peripheral vascular disease, unspecified: Secondary | ICD-10-CM | POA: Diagnosis not present

## 2015-08-11 DIAGNOSIS — E785 Hyperlipidemia, unspecified: Secondary | ICD-10-CM | POA: Diagnosis not present

## 2015-08-11 DIAGNOSIS — T82858A Stenosis of vascular prosthetic devices, implants and grafts, initial encounter: Secondary | ICD-10-CM | POA: Diagnosis not present

## 2015-08-11 DIAGNOSIS — Z992 Dependence on renal dialysis: Secondary | ICD-10-CM | POA: Diagnosis not present

## 2015-08-11 DIAGNOSIS — E119 Type 2 diabetes mellitus without complications: Secondary | ICD-10-CM | POA: Diagnosis not present

## 2015-08-11 DIAGNOSIS — I10 Essential (primary) hypertension: Secondary | ICD-10-CM | POA: Diagnosis not present

## 2015-08-11 DIAGNOSIS — N186 End stage renal disease: Secondary | ICD-10-CM | POA: Diagnosis not present

## 2015-08-16 ENCOUNTER — Other Ambulatory Visit: Payer: Self-pay | Admitting: Internal Medicine

## 2015-08-28 ENCOUNTER — Other Ambulatory Visit: Payer: Self-pay | Admitting: Surgical

## 2015-08-28 MED ORDER — INSULIN ASPART PROT & ASPART (70-30 MIX) 100 UNIT/ML PEN: PEN_INJECTOR | SUBCUTANEOUS | 1 refills | Status: DC

## 2015-09-12 DIAGNOSIS — D485 Neoplasm of uncertain behavior of skin: Secondary | ICD-10-CM | POA: Diagnosis not present

## 2015-09-12 DIAGNOSIS — L570 Actinic keratosis: Secondary | ICD-10-CM | POA: Diagnosis not present

## 2015-09-12 DIAGNOSIS — C4442 Squamous cell carcinoma of skin of scalp and neck: Secondary | ICD-10-CM | POA: Diagnosis not present

## 2015-09-12 DIAGNOSIS — L218 Other seborrheic dermatitis: Secondary | ICD-10-CM | POA: Diagnosis not present

## 2015-09-12 DIAGNOSIS — X32XXXA Exposure to sunlight, initial encounter: Secondary | ICD-10-CM | POA: Diagnosis not present

## 2015-10-02 ENCOUNTER — Other Ambulatory Visit: Payer: Self-pay

## 2015-10-06 DIAGNOSIS — C4442 Squamous cell carcinoma of skin of scalp and neck: Secondary | ICD-10-CM | POA: Diagnosis not present

## 2015-11-02 DIAGNOSIS — E7800 Pure hypercholesterolemia, unspecified: Secondary | ICD-10-CM | POA: Diagnosis not present

## 2015-11-02 DIAGNOSIS — E119 Type 2 diabetes mellitus without complications: Secondary | ICD-10-CM | POA: Diagnosis not present

## 2015-11-02 DIAGNOSIS — I2510 Atherosclerotic heart disease of native coronary artery without angina pectoris: Secondary | ICD-10-CM | POA: Diagnosis not present

## 2015-11-02 DIAGNOSIS — Z951 Presence of aortocoronary bypass graft: Secondary | ICD-10-CM | POA: Diagnosis not present

## 2015-11-02 DIAGNOSIS — R011 Cardiac murmur, unspecified: Secondary | ICD-10-CM | POA: Diagnosis not present

## 2015-11-02 DIAGNOSIS — I495 Sick sinus syndrome: Secondary | ICD-10-CM | POA: Diagnosis not present

## 2015-11-02 DIAGNOSIS — I701 Atherosclerosis of renal artery: Secondary | ICD-10-CM | POA: Diagnosis not present

## 2015-11-02 DIAGNOSIS — I10 Essential (primary) hypertension: Secondary | ICD-10-CM | POA: Diagnosis not present

## 2015-11-05 ENCOUNTER — Other Ambulatory Visit: Payer: Self-pay | Admitting: Internal Medicine

## 2015-11-16 DIAGNOSIS — Z961 Presence of intraocular lens: Secondary | ICD-10-CM | POA: Diagnosis not present

## 2015-11-16 DIAGNOSIS — H40013 Open angle with borderline findings, low risk, bilateral: Secondary | ICD-10-CM | POA: Diagnosis not present

## 2015-11-22 DIAGNOSIS — I2510 Atherosclerotic heart disease of native coronary artery without angina pectoris: Secondary | ICD-10-CM | POA: Diagnosis not present

## 2015-11-22 DIAGNOSIS — Z951 Presence of aortocoronary bypass graft: Secondary | ICD-10-CM | POA: Diagnosis not present

## 2015-11-24 LAB — HM DIABETES EYE EXAM

## 2015-12-25 ENCOUNTER — Encounter: Payer: Self-pay | Admitting: Internal Medicine

## 2015-12-25 ENCOUNTER — Ambulatory Visit (INDEPENDENT_AMBULATORY_CARE_PROVIDER_SITE_OTHER): Payer: Medicare Other | Admitting: Internal Medicine

## 2015-12-25 VITALS — BP 134/64 | HR 69 | Temp 97.6°F | Resp 12 | Ht 68.0 in | Wt 192.2 lb

## 2015-12-25 DIAGNOSIS — Z23 Encounter for immunization: Secondary | ICD-10-CM | POA: Diagnosis not present

## 2015-12-25 DIAGNOSIS — N183 Chronic kidney disease, stage 3 unspecified: Secondary | ICD-10-CM

## 2015-12-25 DIAGNOSIS — M79644 Pain in right finger(s): Secondary | ICD-10-CM

## 2015-12-25 DIAGNOSIS — E1121 Type 2 diabetes mellitus with diabetic nephropathy: Secondary | ICD-10-CM | POA: Diagnosis not present

## 2015-12-25 DIAGNOSIS — R011 Cardiac murmur, unspecified: Secondary | ICD-10-CM

## 2015-12-25 DIAGNOSIS — E7800 Pure hypercholesterolemia, unspecified: Secondary | ICD-10-CM

## 2015-12-25 DIAGNOSIS — E1151 Type 2 diabetes mellitus with diabetic peripheral angiopathy without gangrene: Secondary | ICD-10-CM

## 2015-12-25 DIAGNOSIS — Z794 Long term (current) use of insulin: Secondary | ICD-10-CM

## 2015-12-25 DIAGNOSIS — M25541 Pain in joints of right hand: Secondary | ICD-10-CM | POA: Insufficient documentation

## 2015-12-25 MED ORDER — TRAMADOL HCL 50 MG PO TABS: 50.0000 mg | ORAL_TABLET | Freq: Two times a day (BID) | ORAL | 4 refills | Status: DC | PRN

## 2015-12-25 NOTE — Progress Notes (Signed)
Pre-visit discussion using our clinic review tool. No additional management support is needed unless otherwise documented below in the visit note.  

## 2015-12-25 NOTE — Assessment & Plan Note (Addendum)
Resolution of hypoglycemic events resulting from elimination of  lunchtime insulin.  DM still very well controlled for age and risks of adverse events lowered with slight loosening of control. Can continue  check twice daily ; increase evenign dose to 44 units  and reduce morning dose to 18 untis   Lab Results  Component Value Date   HGBA1C 7.2 (H) 12/25/2015

## 2015-12-25 NOTE — Assessment & Plan Note (Signed)
Secondary to DJD.  Ortho referral for joint injection deferred by patient. Trial fo tylenol/tramadol

## 2015-12-25 NOTE — Assessment & Plan Note (Signed)
Reviewed ECHO with patientt,  He has mild to moderate Aortic stenosis,  Mild Aortic regurgitation,  Mild Mitral regurgitation and severe  tricuspid regurgitation all noted on recent ECHO done by [facility].  patient as asymptomatic with walking.

## 2015-12-25 NOTE — Assessment & Plan Note (Signed)
Reminded to avoid NSAIDs for management of arthritis b  tmay try turmeric ,  Tylenol and tramadol

## 2015-12-25 NOTE — Patient Instructions (Addendum)
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 or your pain :  Tylenol 1000 mg twice daily  Adding tramadol 50 mg up to twice daily   Yo can try the natural anti  inflammatory turmeric 600 mg once or twice daily capsule form   But NOT ADVIL OR MOTRIN OR ALEVE  Increase the evening  insulin dose to 44 units.    Decrease  the morning dose to to 18 units

## 2015-12-25 NOTE — Assessment & Plan Note (Signed)
Insulin regimen lowered to 18 units in the am and increased to 44 units in the evening .  A1c due. Resolution of hypoglycemic events resulting from elimination of  lunchtime insulin.  DM still very well controlled for age and risks of adverse events lowered with slight loosening of control.   Lab Results  Component Value Date   HGBA1C 6.9 (H) 06/26/2015

## 2015-12-25 NOTE — Progress Notes (Addendum)
Subjective:  Patient ID: [name], male    DOB: March 22, 1924  Age: 82 y.o. MRN: [id number]  CC: The primary encounter diagnosis was Pure hypercholesterolemia. Diagnoses of Diabetes mellitus with peripheral vascular disease ([place]), Undiagnosed cardiac murmurs, Type 2 diabetes mellitus with diabetic nephropathy, with long-term current use of insulin ([place]), CKD (chronic kidney disease) stage 3, GFR 30-59 ml/min, Pain in thumb joint with movement of right hand, and Encounter for immunization were also pertinent to this visit.  HPI [name] presents for  Follow up on Type 2 DM managed with insulin. Marland Kitchen  Has been taking only 2 insulin  injections per day instead of 3 as he was previously doing, .  Did not bring his BS log with him,  Says there is no significant change. DM:  Rare BS > 200 ,  A few < 80 in the middle of the day taking 21 units in the am and 41 in the evening  Notes high sugars at bedtime .    He has several pain complaints. Today   Arthritis right thumb interfering with writing. Due to pain at the base of the joint.  Also left hand index finger and middle finger the tendon in between sharp pain with twisting finger,  Noticed it when he tried to put on his shoe.  The pain is sharp.  No history of trauma.  Right sided back pian lower thoracic area,  Below his kidney,  aggravated by bending over and twisting ,  Worse in the morning after getting out of bed.  The first couple minutes are very painful   Had an ECHO in October by Miquel Dunn .  Has not heard the results yet,  Results reviewed wuith patient today, EF 45%   Moderate aortic stenosis moderate    Flu vaccine given today.  Lab Results  Component Value Date   HGBA1C 7.2 (H) 12/25/2015     Outpatient Medications Prior to Visit  Medication Sig Dispense Refill  . amLODipine (NORVASC) 10 MG tablet TAKE 1 TABLET (10 MG TOTAL) BY MOUTH DAILY. 90 tablet 2  . aspirin 325 MG tablet Take 325 mg by mouth daily.      Marland Kitchen  atorvastatin (LIPITOR) 20 MG tablet TAKE 1 TABLET (20 MG TOTAL) BY MOUTH DAILY. 90 tablet 1  . B-D ULTRAFINE III SHORT PEN 31G X 8 MM MISC USE AS DIRECTED 3 TIMES A DAY 300 each 5  . Calcium Carbonate-Vitamin D (CALCIUM + D) 600-200 MG-UNIT TABS Take 1 tablet by mouth 2 (two) times daily.      . clopidogrel (PLAVIX) 75 MG tablet TAKE 1 TABLET (75 MG TOTAL) BY MOUTH DAILY. 90 tablet 1  . cyanocobalamin (,VITAMIN B-12,) 1000 MCG/ML injection Inject 1 mL (1,000 mcg total) into the muscle every 30 (thirty) days. 10 mL 1  . fenofibrate (TRICOR) 48 MG tablet TAKE 1 TABLET BY MOUTH DAILY 90 tablet 1  . furosemide (LASIX) 20 MG tablet TAKE 1 TABLET EVERY OTHER DAY FOR FLUID RETENTION 15 tablet 5  . glucose blood (ACCU-CHEK AVIVA PLUS) test strip Check blood sugar 4 times daily, and PRN Dx E11.51 360 each 3  . hydrocortisone 2.5 % cream Apply 1 application topically 2 (two) times daily as needed.    . insulin aspart protamine - aspart (NOVOLOG MIX 70/30 FLEXPEN) (70-30) 100 UNIT/ML FlexPen 21 units in the am and 41 units in the pm pre meal 20 pen 1  . Insulin Syringe-Needle U-100 (B-D INS SYRINGE  0.5CC/31GX5/16) 31G X 5/16" 0.5 ML MISC 1 application by Does not apply route 3 (three) times daily before meals. 300 each 3  . latanoprost (XALATAN) 0.005 % ophthalmic solution Place 1 drop into both eyes at bedtime.  3  . metoprolol (LOPRESSOR) 50 MG tablet TAKE 1 TABLET BY MOUTH TWICE A DAY 180 tablet 1  . Syringe, Disposable, 3 ML MISC For use with B12 injections weekly/monthly 25 each 0  . travoprost, benzalkonium, (TRAVATAN) 0.004 % ophthalmic solution Place 1 drop into both eyes at bedtime.     . valsartan (DIOVAN) 160 MG tablet TAKE 1 TABLET BY MOUTH DAILY 90 tablet 1   No facility-administered medications prior to visit.     Review of Systems;  Patient denies headache, fevers, malaise, unintentional weight loss, skin rash, eye pain, sinus congestion and sinus pain, sore throat, dysphagia,  hemoptysis ,  cough, dyspnea, wheezing, chest pain, palpitations, orthopnea, edema, abdominal pain, nausea, melena, diarrhea, constipation,  dysuria, hematuria, urinary  Frequency, nocturia, numbness, tingling, seizures,  Focal weakness, Loss of consciousness,  Tremor, insomnia, depression, anxiety, and suicidal ideation.      Objective:  BP 134/64   Pulse 69   Temp 97.6 F (36.4 C) (Oral)   Resp 12   Ht 5\' 8"  (1.727 m)   Wt 192 lb 4 oz (87.2 kg)   SpO2 98%   BMI 29.23 kg/m   BP Readings from Last 3 Encounters:  12/25/15 134/64  06/26/15 132/68  12/12/14 128/64    Wt Readings from Last 3 Encounters:  12/25/15 192 lb 4 oz (87.2 kg)  06/26/15 186 lb 4 oz (84.5 kg)  12/12/14 190 lb 9.6 oz (86.5 kg)    General appearance: alert, cooperative and appears stated age Ears: normal TM's and external ear canals both ears Throat: lips, mucosa, and tongue normal; teeth and gums normal Neck: no adenopathy, no carotid bruit, supple, symmetrical, trachea midline and thyroid not enlarged, symmetric, no tenderness/mass/nodules Back: symmetric, no curvature. ROM normal. No CVA tenderness. Lungs: clear to auscultation bilaterally Heart: regular rate and rhythm, S1, S2 normal, no murmur, click, rub or gallop Abdomen: soft, non-tender; bowel sounds normal; no masses,  no organomegaly Pulses: 2+ and symmetric MSK: no synovitis or warmth, no tophi.  Skin: Skin color, texture, turgor normal. No rashes or lesions Lymph nodes: Cervical, supraclavicular, and axillary nodes normal.  Lab Results  Component Value Date   HGBA1C 7.2 (H) 12/25/2015   HGBA1C 6.9 (H) 06/26/2015   HGBA1C 6.6 (H) 03/13/2015    Lab Results  Component Value Date   CREATININE 1.87 (H) 12/25/2015   CREATININE 1.55 (H) 06/26/2015   CREATININE 1.89 (H) 03/13/2015    Lab Results  Component Value Date   WBC 6.4 01/09/2011   HGB 13.0 01/09/2011   HCT 40.0 01/09/2011   PLT 215 01/09/2011   GLUCOSE 163 (H) 12/25/2015   CHOL 93  12/25/2015   TRIG 166.0 (H) 12/25/2015   HDL 36.10 (L) 12/25/2015   LDLDIRECT 49.0 06/26/2015   LDLCALC 24 12/25/2015   ALT 10 12/25/2015   AST 12 12/25/2015   NA 142 12/25/2015   K 4.7 12/25/2015   CL 107 12/25/2015   CREATININE 1.87 (H) 12/25/2015   BUN 37 (H) 12/25/2015   CO2 27 12/25/2015   TSH 1.49 06/13/2014   HGBA1C 7.2 (H) 12/25/2015   MICROALBUR 1.2 03/13/2015    Dg Chest 2 View  Result Date: 01/09/2011 *RADIOLOGY REPORT* Clinical Data: Preop back surgery. CHEST - 2 VIEW  Comparison: None. Findings: Post CABG.  Sequential pacemaker enters from the left with the tips at the level of the right atrium and right ventricle. Cardiomegaly. Central pulmonary vascular prominence. No infiltrate, congestive heart failure or pneumothorax. Calcified aorta.  Mild thoracic spine degenerative changes. IMPRESSION: Cardiomegaly with pacemaker in place. No infiltrate, congestive heart failure or pneumothorax. Original Report Authenticated By: Doug Sou, M.D.   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 ([place])    Resolution of hypoglycemic events resulting from elimination of  lunchtime insulin.  DM still very well controlled for age and risks of adverse events lowered with slight loosening of control. Can continue  check twice daily ; increase evenign dose to 44 units  and reduce morning dose to 18 untis   Lab Results  Component Value Date   HGBA1C 7.2 (H) 12/25/2015           Diabetes mellitus with peripheral vascular disease ([facility])    Insulin regimen lowered to 18 units in the am and increased to 44 units in the evening .  A1c due. Resolution of hypoglycemic events resulting from elimination of  lunchtime insulin.  DM still very well controlled for age and risks of adverse events lowered with slight loosening of control.   Lab Results  Component Value Date   HGBA1C 6.9 (H) 06/26/2015             Relevant Orders   Comprehensive metabolic panel (Completed)   Hemoglobin A1c (Completed)   CKD (chronic kidney disease) stage 3, GFR 30-59 ml/min    Reminded to avoid NSAIDs for management of arthritis b  tmay try turmeric ,  Tylenol and tramadol      Undiagnosed cardiac murmurs    Reviewed ECHO with patientt,  He has mild to moderate Aortic stenosis,  Mild Aortic regurgitation,  Mild Mitral regurgitation and severe  tricuspid regurgitation all noted on recent ECHO done by [facility].  patient as asymptomatic with walking.         Pain in thumb joint with movement of right hand    Secondary to DJD.  Ortho referral for joint injection deferred by patient. Trial fo tylenol/tramadol       Other Visit Diagnoses    Pure hypercholesterolemia    -  Primary   Relevant Orders   Lipid panel (Completed)   Encounter for immunization       Relevant Orders   Flu vaccine HIGH DOSE PF (Completed)    A total of 25 minutes of face to face time was spent with patient more than half of which was spent in counselling about the above mentioned conditions  and coordination of care   I am having Mr. Gleed start on traMADol. I am also having him maintain his aspirin, Calcium Carbonate-Vitamin D, Insulin Syringe-Needle U-100, travoprost (benzalkonium), B-D ULTRAFINE III SHORT PEN, hydrocortisone, latanoprost, amLODipine, glucose blood, Syringe (Disposable), cyanocobalamin, metoprolol, valsartan, atorvastatin, clopidogrel, fenofibrate, insulin aspart protamine - aspart, and furosemide.  Meds ordered this encounter  Medications  . traMADol (ULTRAM) 50 MG tablet    Sig: Take 1 tablet (50 mg total) by mouth every 12 (twelve) hours as needed.    Dispense:  60 tablet    Refill:  4    There are no discontinued medications.  Follow-up: No Follow-up on file.   Crecencio Mc, MD

## 2015-12-26 LAB — COMPREHENSIVE METABOLIC PANEL
ALK PHOS: 57 U/L (ref 39–117)
ALT: 10 U/L (ref 0–53)
AST: 12 U/L (ref 0–37)
Albumin: 3.8 g/dL (ref 3.5–5.2)
BUN: 37 mg/dL — AB (ref 6–23)
CO2: 27 mEq/L (ref 19–32)
Calcium: 9 mg/dL (ref 8.4–10.5)
Chloride: 107 mEq/L (ref 96–112)
Creatinine, Ser: 1.87 mg/dL — ABNORMAL HIGH (ref 0.40–1.50)
GFR: 36.29 mL/min — ABNORMAL LOW (ref >60.00)
GLUCOSE: 163 mg/dL — AB (ref 70–99)
POTASSIUM: 4.7 meq/L (ref 3.5–5.1)
SODIUM: 142 meq/L (ref 135–145)
TOTAL PROTEIN: 6.6 g/dL (ref 6.0–8.3)
Total Bilirubin: 0.5 mg/dL (ref 0.2–1.2)

## 2015-12-26 LAB — HEMOGLOBIN A1C: Hgb A1c MFr Bld: 7.2 % — ABNORMAL HIGH (ref 4.6–6.5)

## 2015-12-26 LAB — LIPID PANEL
CHOL/HDL RATIO: 3
Cholesterol: 93 mg/dL (ref 0–200)
HDL: 36.1 mg/dL — AB (ref >39.00)
LDL Cholesterol: 24 mg/dL (ref 0–99)
NONHDL: 56.73
Triglycerides: 166 mg/dL — ABNORMAL HIGH (ref 0.0–149.0)
VLDL: 33.2 mg/dL (ref 0.0–40.0)

## 2015-12-31 ENCOUNTER — Other Ambulatory Visit: Payer: Self-pay | Admitting: Internal Medicine

## 2016-01-03 ENCOUNTER — Telehealth: Payer: Self-pay | Admitting: Internal Medicine

## 2016-01-03 NOTE — Telephone Encounter (Signed)
Pt declined to get the AWV. Thank you! °

## 2016-01-08 ENCOUNTER — Ambulatory Visit (INDEPENDENT_AMBULATORY_CARE_PROVIDER_SITE_OTHER): Payer: Medicare Other

## 2016-01-08 ENCOUNTER — Ambulatory Visit (INDEPENDENT_AMBULATORY_CARE_PROVIDER_SITE_OTHER): Payer: Medicare Other | Admitting: Internal Medicine

## 2016-01-08 ENCOUNTER — Encounter: Payer: Self-pay | Admitting: Internal Medicine

## 2016-01-08 VITALS — BP 130/74 | HR 61 | Temp 97.6°F | Resp 12 | Ht 68.0 in | Wt 187.5 lb

## 2016-01-08 DIAGNOSIS — M47814 Spondylosis without myelopathy or radiculopathy, thoracic region: Secondary | ICD-10-CM | POA: Diagnosis not present

## 2016-01-08 DIAGNOSIS — R300 Dysuria: Secondary | ICD-10-CM

## 2016-01-08 DIAGNOSIS — M546 Pain in thoracic spine: Secondary | ICD-10-CM

## 2016-01-08 LAB — POCT URINALYSIS DIPSTICK
Bilirubin, UA: NEGATIVE
GLUCOSE UA: NEGATIVE
Ketones, UA: NEGATIVE
Leukocytes, UA: NEGATIVE
NITRITE UA: NEGATIVE
PH UA: 6
RBC UA: NEGATIVE
UROBILINOGEN UA: 0.2

## 2016-01-08 MED ORDER — TRAMADOL HCL 50 MG PO TABS: 50.0000 mg | ORAL_TABLET | Freq: Four times a day (QID) | ORAL | 4 refills | Status: DC | PRN

## 2016-01-08 MED ORDER — TRAMADOL HCL 50 MG PO TABS: 50.0000 mg | ORAL_TABLET | Freq: Three times a day (TID) | ORAL | 0 refills | Status: DC | PRN

## 2016-01-08 MED ORDER — TIZANIDINE HCL 4 MG PO TABS: 4.0000 mg | ORAL_TABLET | Freq: Four times a day (QID) | ORAL | 0 refills | Status: DC | PRN

## 2016-01-08 NOTE — Progress Notes (Signed)
Pre-visit discussion using our clinic review tool. No additional management support is needed unless otherwise documented below in the visit note.  

## 2016-01-08 NOTE — Patient Instructions (Signed)
  Your urinalysis was normal,  So a kidney stone is less likely to be causing your  Right sided thoracic back pain  However, if your x rays do not suggest an obvious cause for your pain,  We will proceed with a CT scan

## 2016-01-08 NOTE — Progress Notes (Signed)
Subjective:  Patient ID: [name], male    DOB: 1924-07-01  Age: 82 y.o. MRN: [id number]  CC: The primary encounter diagnosis was Dysuria. A diagnosis of Acute right-sided thoracic back pain was also pertinent to this visit.  HPI [name] presents for moderate right sided posterior thoracic pain ,  Mentioned at his visit  2 weeks ago  Has become severe and persistent . Yesterday afternoon suddenly became severe around 1 pm.  By 6 otr 7 pm it was better after taking 1000 mg of  tylenol  .  No precipitating activiy,  Does not golf,  Was not raking leaves.  No history of kidney stones but pain is stabbing intermittent not currently having  Last episode was yesterday.  Pain is nonradiating , aggravating by twisitn gbut not by bending   Drinks  A  Diet coke and water daily  And 24 ounces of ewater   Outpatient Medications Prior to Visit  Medication Sig Dispense Refill  . amLODipine (NORVASC) 10 MG tablet TAKE 1 TABLET (10 MG TOTAL) BY MOUTH DAILY. 90 tablet 2  . aspirin 325 MG tablet Take 325 mg by mouth daily.      Marland Kitchen atorvastatin (LIPITOR) 20 MG tablet TAKE 1 TABLET (20 MG TOTAL) BY MOUTH DAILY. 90 tablet 1  . B-D ULTRAFINE III SHORT PEN 31G X 8 MM MISC USE AS DIRECTED 3 TIMES A DAY 300 each 5  . Calcium Carbonate-Vitamin D (CALCIUM + D) 600-200 MG-UNIT TABS Take 1 tablet by mouth 2 (two) times daily.      . clopidogrel (PLAVIX) 75 MG tablet TAKE 1 TABLET (75 MG TOTAL) BY MOUTH DAILY. 90 tablet 1  . cyanocobalamin (,VITAMIN B-12,) 1000 MCG/ML injection Inject 1 mL (1,000 mcg total) into the muscle every 30 (thirty) days. 10 mL 1  . fenofibrate (TRICOR) 48 MG tablet TAKE 1 TABLET BY MOUTH DAILY 90 tablet 1  . furosemide (LASIX) 20 MG tablet TAKE 1 TABLET EVERY OTHER DAY FOR FLUID RETENTION 15 tablet 5  . glucose blood (ACCU-CHEK AVIVA PLUS) test strip Check blood sugar 4 times daily, and PRN Dx E11.51 360 each 3  . hydrocortisone 2.5 % cream Apply 1 application topically 2 (two)  times daily as needed.    . Insulin Syringe-Needle U-100 (B-D INS SYRINGE 0.5CC/31GX5/16) 31G X 5/16" 0.5 ML MISC 1 application by Does not apply route 3 (three) times daily before meals. 300 each 3  . latanoprost (XALATAN) 0.005 % ophthalmic solution Place 1 drop into both eyes at bedtime.  3  . metoprolol (LOPRESSOR) 50 MG tablet TAKE 1 TABLET BY MOUTH TWICE A DAY 180 tablet 1  . NOVOLOG MIX 70/30 FLEXPEN (70-30) 100 UNIT/ML FlexPen INJECT 21 UNITS IN THE AM AND 41 UNITS IN THE PM PRE MEAL 60 pen 1  . Syringe, Disposable, 3 ML MISC For use with B12 injections weekly/monthly 25 each 0  . travoprost, benzalkonium, (TRAVATAN) 0.004 % ophthalmic solution Place 1 drop into both eyes at bedtime.     . valsartan (DIOVAN) 160 MG tablet TAKE 1 TABLET BY MOUTH DAILY 90 tablet 1  . traMADol (ULTRAM) 50 MG tablet Take 1 tablet (50 mg total) by mouth every 12 (twelve) hours as needed. 60 tablet 4   No facility-administered medications prior to visit.     Review of Systems;  Patient denies headache, fevers, malaise, unintentional weight loss, skin rash, eye pain, sinus congestion and sinus pain, sore throat, dysphagia,  hemoptysis , cough,  dyspnea, wheezing, chest pain, palpitations, orthopnea, edema, abdominal pain, nausea, melena, diarrhea, constipation, flank pain, dysuria, hematuria, urinary  Frequency, nocturia, numbness, tingling, seizures,  Focal weakness, Loss of consciousness,  Tremor, insomnia, depression, anxiety, and suicidal ideation.      Objective:  BP 130/74   Pulse 61   Temp 97.6 F (36.4 C) (Oral)   Resp 12   Ht 5\' 8"  (1.727 m)   Wt 187 lb 8 oz (85 kg)   SpO2 97%   BMI 28.51 kg/m   BP Readings from Last 3 Encounters:  01/08/16 130/74  12/25/15 134/64  06/26/15 132/68    Wt Readings from Last 3 Encounters:  01/08/16 187 lb 8 oz (85 kg)  12/25/15 192 lb 4 oz (87.2 kg)  06/26/15 186 lb 4 oz (84.5 kg)    General appearance: alert, cooperative and appears stated age Ears:  normal TM's and external ear canals both ears Throat: lips, mucosa, and tongue normal; teeth and gums normal Neck: no adenopathy, no carotid bruit, supple, symmetrical, trachea midline and thyroid not enlarged, symmetric, no tenderness/mass/nodules Back: symmetric, no curvature. ROM normal. No spinal or CVA tenderness. Lungs: clear to auscultation bilaterally Heart: regular rate and rhythm, S1, S2 normal, no murmur, click, rub or gallop Abdomen: soft, non-tender; bowel sounds normal; no masses,  no organomegaly Pulses: 2+ and symmetric Skin: Skin color, texture, turgor normal. No rashes or lesions Lymph nodes: Cervical, supraclavicular, and axillary nodes normal.  Lab Results  Component Value Date   HGBA1C 7.2 (H) 12/25/2015   HGBA1C 6.9 (H) 06/26/2015   HGBA1C 6.6 (H) 03/13/2015    Lab Results  Component Value Date   CREATININE 1.87 (H) 12/25/2015   CREATININE 1.55 (H) 06/26/2015   CREATININE 1.89 (H) 03/13/2015    Lab Results  Component Value Date   WBC 6.4 01/09/2011   HGB 13.0 01/09/2011   HCT 40.0 01/09/2011   PLT 215 01/09/2011   GLUCOSE 163 (H) 12/25/2015   CHOL 93 12/25/2015   TRIG 166.0 (H) 12/25/2015   HDL 36.10 (L) 12/25/2015   LDLDIRECT 49.0 06/26/2015   LDLCALC 24 12/25/2015   ALT 10 12/25/2015   AST 12 12/25/2015   NA 142 12/25/2015   K 4.7 12/25/2015   CL 107 12/25/2015   CREATININE 1.87 (H) 12/25/2015   BUN 37 (H) 12/25/2015   CO2 27 12/25/2015   TSH 1.49 06/13/2014   HGBA1C 7.2 (H) 12/25/2015   MICROALBUR 1.2 03/13/2015    Dg Chest 2 View  Result Date: 01/09/2011 *RADIOLOGY REPORT* Clinical Data: Preop back surgery. CHEST - 2 VIEW Comparison: None. Findings: Post CABG.  Sequential pacemaker enters from the left with the tips at the level of the right atrium and right ventricle. Cardiomegaly. Central pulmonary vascular prominence. No infiltrate, congestive heart failure or pneumothorax. Calcified aorta.  Mild thoracic spine degenerative changes.  IMPRESSION: Cardiomegaly with pacemaker in place. No infiltrate, congestive heart failure or pneumothorax. Original Report Authenticated By: Doug Sou, M.D.   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       Relevant Medications   tiZANidine (ZANAFLEX) 4 MG tablet   traMADol (ULTRAM) 50 MG tablet   Other Relevant Orders   DG Thoracic Spine W/Swimmers (Completed)  Other Visit Diagnoses    Dysuria    -  Primary   Relevant Orders   POCT Urinalysis Dipstick (Completed)   Urinalysis, Routine w reflex microscopic      I am having Mr. Caine start on tiZANidine. I am also having him maintain his aspirin, Calcium Carbonate-Vitamin D, Insulin Syringe-Needle U-100, travoprost (benzalkonium), B-D ULTRAFINE III SHORT PEN, hydrocortisone, latanoprost, amLODipine, glucose blood, Syringe (Disposable), cyanocobalamin, metoprolol, valsartan, atorvastatin, clopidogrel, fenofibrate, furosemide, NOVOLOG MIX 70/30 FLEXPEN, and traMADol.  Meds ordered this encounter  Medications  . DISCONTD: traMADol (ULTRAM) 50 MG tablet    Sig: Take 1 tablet (50 mg total) by mouth every 8 (eight) hours as needed.    Dispense:  30 tablet    Refill:  0  . DISCONTD: traMADol (ULTRAM) 50 MG tablet    Sig: Take 1 tablet (50 mg total) by mouth every 6 (six) hours as needed.    Dispense:  60 tablet    Refill:  4  . tiZANidine (ZANAFLEX) 4 MG tablet    Sig: Take 1 tablet (4 mg total) by mouth every 6 (six) hours as needed for muscle spasms.    Dispense:  30 tablet    Refill:  0  . traMADol (ULTRAM) 50 MG tablet    Sig: Take 1 tablet (50 mg total) by mouth every 6 (six) hours as needed.    Dispense:  60 tablet    Refill:  4    Medications Discontinued During This  Encounter  Medication Reason  . traMADol (ULTRAM) 50 MG tablet Reorder  . traMADol (ULTRAM) 50 MG tablet   . traMADol (ULTRAM) 50 MG tablet Reorder    Follow-up: No Follow-up on file.   Crecencio Mc, MD

## 2016-01-09 ENCOUNTER — Encounter: Payer: Self-pay | Admitting: Internal Medicine

## 2016-01-09 DIAGNOSIS — M546 Pain in thoracic spine: Secondary | ICD-10-CM | POA: Insufficient documentation

## 2016-01-09 NOTE — Assessment & Plan Note (Addendum)
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

## 2016-01-10 LAB — URINALYSIS, ROUTINE W REFLEX MICROSCOPIC
Bilirubin Urine: NEGATIVE
Hgb urine dipstick: NEGATIVE
Ketones, ur: NEGATIVE
Leukocytes, UA: NEGATIVE
Nitrite: NEGATIVE
PH: 5.5 (ref 5.0–8.0)
RBC / HPF: NONE SEEN (ref 0–?)
SPECIFIC GRAVITY, URINE: 1.015 (ref 1.000–1.030)
TOTAL PROTEIN, URINE-UPE24: NEGATIVE
Urine Glucose: NEGATIVE
Urobilinogen, UA: 0.2 (ref 0.0–1.0)
WBC UA: NONE SEEN (ref 0–?)

## 2016-01-11 ENCOUNTER — Encounter: Payer: Self-pay | Admitting: Internal Medicine

## 2016-01-25 ENCOUNTER — Other Ambulatory Visit: Payer: Self-pay | Admitting: Internal Medicine

## 2016-02-13 DIAGNOSIS — Z08 Encounter for follow-up examination after completed treatment for malignant neoplasm: Secondary | ICD-10-CM | POA: Diagnosis not present

## 2016-02-13 DIAGNOSIS — X32XXXA Exposure to sunlight, initial encounter: Secondary | ICD-10-CM | POA: Diagnosis not present

## 2016-02-13 DIAGNOSIS — Z85828 Personal history of other malignant neoplasm of skin: Secondary | ICD-10-CM | POA: Diagnosis not present

## 2016-02-13 DIAGNOSIS — L570 Actinic keratosis: Secondary | ICD-10-CM | POA: Diagnosis not present

## 2016-02-13 DIAGNOSIS — D225 Melanocytic nevi of trunk: Secondary | ICD-10-CM | POA: Diagnosis not present

## 2016-03-11 ENCOUNTER — Telehealth: Payer: Self-pay | Admitting: Internal Medicine

## 2016-03-11 NOTE — Telephone Encounter (Signed)
I called pt and left a vm to call the office to schedule AWV. Thank you!

## 2016-03-14 ENCOUNTER — Ambulatory Visit (INDEPENDENT_AMBULATORY_CARE_PROVIDER_SITE_OTHER): Payer: Medicare Other

## 2016-03-14 VITALS — BP 132/62 | HR 74 | Temp 97.4°F | Resp 14 | Ht 67.0 in | Wt 189.1 lb

## 2016-03-14 DIAGNOSIS — Z0000 Encounter for general adult medical examination without abnormal findings: Secondary | ICD-10-CM | POA: Diagnosis not present

## 2016-03-14 NOTE — Progress Notes (Signed)
Subjective:   [name] is a 83 y.o. male who presents for Medicare Annual/Subsequent preventive examination.  Review of Systems:  No ROS.  Medicare Wellness Visit.  Cardiac Risk Factors include: advanced age (>72men, >66 women);male gender;hypertension     Objective:    Vitals: BP 132/62 (BP Location: Left Arm, Patient Position: Sitting, Cuff Size: Normal)   Pulse 74   Temp 97.4 F (36.3 C) (Oral)   Resp 14   Ht 5\' 7"  (1.702 m)   Wt 189 lb 1.9 oz (85.8 kg)   SpO2 97%   BMI 29.62 kg/m   Body mass index is 29.62 kg/m.  Tobacco History  Smoking Status  . Former Smoker  . Quit date: 09/27/1985  Smokeless Tobacco  . Never Used     Counseling given: Not Answered   Past Medical History:  Diagnosis Date  . Blood transfusion   . Coronary artery disease    h/o bypass coronary- 2001 & peripheral- 2011 , last full cardiac visit  in [name] , [place], [employer]  . Diabetes mellitus   . H/O: malaria 51   in [place]  . Hyperlipidemia   . Hypertension   . Peripheral artery disease ([name])    folowed by [place] bypass 2011 [name]  . Renal insufficiency    by Nov 2012 labs, no old records available   Past Surgical History:  Procedure Laterality Date  . CORONARY ARTERY BYPASS GRAFT  2001   4 vessel, done in [place]   . FEMORAL BYPASS  2011   done in [place],  now followed by [name]  . INSERT / REPLACE / REMOVE PACEMAKER     2004  . LUMBAR LAMINECTOMY/DECOMPRESSION MICRODISCECTOMY  01/16/2011   Procedure: LUMBAR LAMINECTOMY/DECOMPRESSION MICRODISCECTOMY;  Surgeon: [name];  Location: [name];  Service: Neurosurgery;  Laterality: N/A;  Lumbar four and lumbar five laminectomies   . PACEMAKER INSERTION    . SPINE SURGERY     Family History  Problem Relation Age of Onset  . Mental illness Mother 76    alzheimers  . Peripheral vascular disease Father   . Diabetes Father   . Anesthesia problems Neg Hx   . Hypotension Neg Hx   . Malignant  hyperthermia Neg Hx   . Pseudochol deficiency Neg Hx    History  Sexual Activity  . Sexual activity: No    Outpatient Encounter Prescriptions as of 03/14/2016  Medication Sig  . amLODipine (NORVASC) 10 MG tablet TAKE 1 TABLET (10 MG TOTAL) BY MOUTH DAILY.  Marland Kitchen aspirin 325 MG tablet Take 325 mg by mouth daily.    Marland Kitchen atorvastatin (LIPITOR) 20 MG tablet TAKE 1 TABLET (20 MG TOTAL) BY MOUTH DAILY.  Marland Kitchen B-D ULTRAFINE III SHORT PEN 31G X 8 MM MISC USE AS DIRECTED 3 TIMES A DAY  . Calcium Carbonate-Vitamin D (CALCIUM + D) 600-200 MG-UNIT TABS Take 1 tablet by mouth 2 (two) times daily.    . clopidogrel (PLAVIX) 75 MG tablet TAKE 1 TABLET (75 MG TOTAL) BY MOUTH DAILY.  . cyanocobalamin (,VITAMIN B-12,) 1000 MCG/ML injection Inject 1 mL (1,000 mcg total) into the muscle every 30 (thirty) days.  . fenofibrate (TRICOR) 48 MG tablet TAKE 1 TABLET BY MOUTH DAILY  . furosemide (LASIX) 20 MG tablet TAKE 1 TABLET EVERY OTHER DAY FOR FLUID RETENTION  . glucose blood (ACCU-CHEK AVIVA PLUS) test strip Check blood sugar 4 times daily, and PRN Dx E11.51  . hydrocortisone 2.5 % cream  Apply 1 application topically 2 (two) times daily as needed.  . Insulin Syringe-Needle U-100 (B-D INS SYRINGE 0.5CC/31GX5/16) 31G X 5/16" 0.5 ML MISC 1 application by Does not apply route 3 (three) times daily before meals.  . latanoprost (XALATAN) 0.005 % ophthalmic solution Place 1 drop into both eyes at bedtime.  Marland Kitchen NOVOLOG MIX 70/30 FLEXPEN (70-30) 100 UNIT/ML FlexPen INJECT 21 UNITS IN THE AM AND 41 UNITS IN THE PM PRE MEAL  . Syringe, Disposable, 3 ML MISC For use with B12 injections weekly/monthly  . travoprost, benzalkonium, (TRAVATAN) 0.004 % ophthalmic solution Place 1 drop into both eyes at bedtime.   . valsartan (DIOVAN) 160 MG tablet TAKE 1 TABLET BY MOUTH DAILY  . [DISCONTINUED] metoprolol (LOPRESSOR) 50 MG tablet TAKE 1 TABLET BY MOUTH TWICE A DAY  . [DISCONTINUED] tiZANidine (ZANAFLEX) 4 MG tablet Take 1 tablet (4 mg  total) by mouth every 6 (six) hours as needed for muscle spasms.  . [DISCONTINUED] traMADol (ULTRAM) 50 MG tablet Take 1 tablet (50 mg total) by mouth every 6 (six) hours as needed.   No facility-administered encounter medications on file as of 03/14/2016.     Activities of Daily Living In your present state of health, do you have any difficulty performing the following activities: 03/14/2016  Hearing? Y  Vision? N  Difficulty concentrating or making decisions? N  Walking or climbing stairs? N  Dressing or bathing? N  Doing errands, shopping? N  Preparing Food and eating ? N  Using the Toilet? N  In the past six months, have you accidently leaked urine? N  Do you have problems with loss of bowel control? N  Managing your Medications? N  Managing your Finances? N  Housekeeping or managing your Housekeeping? N  Some recent data might be hidden    Patient Care Team: [name] Mc, MD as PCP - General (Internal Medicine)   Assessment:    This is a routine wellness examination for [name]. The goal of the wellness visit is to assist the patient how to close the gaps in care and create a preventative care plan for the patient.   Taking calcium VIT D as appropriate/Osteoporosis risk reviewed.  Medications reviewed; taking without issues or barriers.  Safety issues reviewed; smoke detectors in the home. No firearms in the home. Wears seatbelts when driving or riding with others. No violence in the home.  No identified risk were noted; The patient was oriented x 3; appropriate in dress and manner and no objective failures at ADL's or IADL's.   BMI; discussed the importance of a healthy diet, water intake and exercise. Educational material provided.  HTN; stable and followed by PCP.  E11.21 Type 2 diabetes mellitus; stable and followed by PCP.  Patient Concerns: None at this time. Follow up with PCP as needed.  Exercise Activities and Dietary recommendations Current  Exercise Habits: Home exercise routine (Swimming), Type of exercise: walking, Time (Minutes): 20, Frequency (Times/Week): 3, Weekly Exercise (Minutes/Week): 60, Intensity: Mild  Goals    . Healthy Lifetstyle          Stay hydrated and drink plenty of fluids/water Stay active and walk or swim for exercise  Low carb foods      Fall Risk Fall Risk  03/14/2016 12/25/2015 10/02/2015 06/14/2014 06/13/2014  Falls in the past year? No No No No No   Depression Screen PHQ 2/9 Scores 03/14/2016 12/25/2015 06/14/2014 06/13/2014  PHQ - 2 Score 0 0 2 0  PHQ- 9 Score - -  6 -    Cognitive Function     6CIT Screen 03/14/2016  What Year? 0 points  What month? 0 points  What time? 0 points  Count back from 20 0 points  Months in reverse 0 points  Repeat phrase 0 points  Total Score 0    Immunization History  Administered Date(s) Administered  . Influenza Split 11/06/2010, 10/21/2011  . Influenza, High Dose Seasonal PF 12/25/2015  . Influenza,inj,Quad PF,36+ Mos 11/03/2012, 10/30/2013, 12/12/2014  . Pneumococcal Conjugate-13 05/04/2013  . Pneumococcal Polysaccharide-23 12/03/2009, 12/12/2014  . Td 07/28/2005  . Tdap 09/02/2012  . Zoster 03/08/2013   Screening Tests Health Maintenance  Topic Date Due  . FOOT EXAM  12/12/2015  . HEMOGLOBIN A1C  06/23/2016  . OPHTHALMOLOGY EXAM  11/23/2016  . TETANUS/TDAP  09/03/2022  . INFLUENZA VACCINE  Completed  . ZOSTAVAX  Completed  . PNA vac Low Risk Adult  Completed      Plan:   End of life planning; Advance aging; Advanced directives discussed. Copy of current HCPOA/Living Will requested.  Medicare Attestation I have personally reviewed: The patient's medical and social history Their use of alcohol, tobacco or illicit drugs Their current medications and supplements The patient's functional ability including ADLs,fall risks, home safety risks, cognitive, and hearing and visual impairment Diet and physical activities Evidence for depression    The patient's weight, height, BMI, and visual acuity have been recorded in the chart.  I have made referrals and provided education to the patient based on review of the above and I have provided the patient with a written personalized care plan for preventive services.    During the course of the visit the patient was educated and counseled about the following appropriate screening and preventive services:   Vaccines to include Pneumoccal, Influenza, Hepatitis B, Td, Zostavax, HCV  Electrocardiogram  Cardiovascular Disease  Colorectal cancer screening  Diabetes screening  Prostate Cancer Screening  Glaucoma screening  Nutrition counseling   Smoking cessation counseling  Patient Instructions (the written plan) was given to the patient.    Varney Biles, LPN  [id number]

## 2016-03-14 NOTE — Patient Instructions (Addendum)
[name] , Thank you for taking time to come for your Medicare Wellness Visit. I appreciate your ongoing commitment to your health goals. Please review the following plan we discussed and let me know if I can assist you in the future.   Follow up with Dr. Derrel Nip as needed.  These are the goals we discussed: Goals    . Healthy Lifetstyle          Stay hydrated and drink plenty of fluids/water Stay active and walk or swim for exercise  Low carb foods       This is a list of the screening recommended for you and due dates:  Health Maintenance  Topic Date Due  . Complete foot exam   12/12/2015  . Hemoglobin A1C  06/23/2016  . Eye exam for diabetics  11/23/2016  . Tetanus Vaccine  09/03/2022  . Flu Shot  Completed  . Shingles Vaccine  Completed  . Pneumonia vaccines  Completed      Diabetes and Foot Care Diabetes may cause you to have problems because of poor blood supply (circulation) to your feet and legs. This may cause the skin on your feet to become thinner, break easier, and heal more slowly. Your skin may become dry, and the skin may peel and crack. You may also have nerve damage in your legs and feet causing decreased feeling in them. You may not notice minor injuries to your feet that could lead to infections or more serious problems. Taking care of your feet is one of the most important things you can do for yourself. Follow these instructions at home:  Wear shoes at all times, even in the house. Do not go barefoot. Bare feet are easily injured.  Check your feet daily for blisters, cuts, and redness. If you cannot see the bottom of your feet, use a mirror or ask someone for help.  Wash your feet with warm water (do not use hot water) and mild soap. Then pat your feet and the areas between your toes until they are completely dry. Do not soak your feet as this can dry your skin.  Apply a moisturizing lotion or petroleum jelly (that does not contain alcohol and is  unscented) to the skin on your feet and to dry, brittle toenails. Do not apply lotion between your toes.  Trim your toenails straight across. Do not dig under them or around the cuticle. File the edges of your nails with an emery board or nail file.  Do not cut corns or calluses or try to remove them with medicine.  Wear clean socks or stockings every day. Make sure they are not too tight. Do not wear knee-high stockings since they may decrease blood flow to your legs.  Wear shoes that fit properly and have enough cushioning. To break in new shoes, wear them for just a few hours a day. This prevents you from injuring your feet. Always look in your shoes before you put them on to be sure there are no objects inside.  Do not cross your legs. This may decrease the blood flow to your feet.  If you find a minor scrape, cut, or break in the skin on your feet, keep it and the skin around it clean and dry. These areas may be cleansed with mild soap and water. Do not cleanse the area with peroxide, alcohol, or iodine.  When you remove an adhesive bandage, be sure not to damage the skin around it.  If you  have a wound, look at it several times a day to make sure it is healing.  Do not use heating pads or hot water bottles. They may burn your skin. If you have lost feeling in your feet or legs, you may not know it is happening until it is too late.  Make sure your health care provider performs a complete foot exam at least annually or more often if you have foot problems. Report any cuts, sores, or bruises to your health care provider immediately. Contact a health care provider if:  You have an injury that is not healing.  You have cuts or breaks in the skin.  You have an ingrown nail.  You notice redness on your legs or feet.  You feel burning or tingling in your legs or feet.  You have pain or cramps in your legs and feet.  Your legs or feet are numb.  Your feet always feel cold. Get  help right away if:  There is increasing redness, swelling, or pain in or around a wound.  There is a red line that goes up your leg.  Pus is coming from a wound.  You develop a fever or as directed by your health care provider.  You notice a bad smell coming from an ulcer or wound. This information is not intended to replace advice given to you by your health care provider. Make sure you discuss any questions you have with your health care provider. Document Released: 01/19/2000 Document Revised: 06/29/2015 Document Reviewed: 06/30/2012 Elsevier Interactive Patient Education  2017 [employer].

## 2016-03-15 NOTE — Progress Notes (Signed)
Care was provided under my supervision. I agree with the management as indicated in the note.  Keshun Berrett DO  

## 2016-04-16 DIAGNOSIS — I495 Sick sinus syndrome: Secondary | ICD-10-CM | POA: Diagnosis not present

## 2016-04-16 DIAGNOSIS — I739 Peripheral vascular disease, unspecified: Secondary | ICD-10-CM | POA: Diagnosis not present

## 2016-04-16 DIAGNOSIS — E119 Type 2 diabetes mellitus without complications: Secondary | ICD-10-CM | POA: Diagnosis not present

## 2016-04-16 DIAGNOSIS — Z951 Presence of aortocoronary bypass graft: Secondary | ICD-10-CM | POA: Diagnosis not present

## 2016-04-16 DIAGNOSIS — N189 Chronic kidney disease, unspecified: Secondary | ICD-10-CM | POA: Diagnosis not present

## 2016-04-16 DIAGNOSIS — E784 Other hyperlipidemia: Secondary | ICD-10-CM | POA: Diagnosis not present

## 2016-04-16 DIAGNOSIS — I2510 Atherosclerotic heart disease of native coronary artery without angina pectoris: Secondary | ICD-10-CM | POA: Diagnosis not present

## 2016-04-16 DIAGNOSIS — I350 Nonrheumatic aortic (valve) stenosis: Secondary | ICD-10-CM | POA: Diagnosis not present

## 2016-04-16 DIAGNOSIS — I10 Essential (primary) hypertension: Secondary | ICD-10-CM | POA: Diagnosis not present

## 2016-04-16 DIAGNOSIS — I701 Atherosclerosis of renal artery: Secondary | ICD-10-CM | POA: Diagnosis not present

## 2016-04-24 ENCOUNTER — Other Ambulatory Visit: Payer: Self-pay | Admitting: Internal Medicine

## 2016-05-15 DIAGNOSIS — L309 Dermatitis, unspecified: Secondary | ICD-10-CM | POA: Diagnosis not present

## 2016-05-16 DIAGNOSIS — Z961 Presence of intraocular lens: Secondary | ICD-10-CM | POA: Diagnosis not present

## 2016-05-16 DIAGNOSIS — H52223 Regular astigmatism, bilateral: Secondary | ICD-10-CM | POA: Diagnosis not present

## 2016-05-16 DIAGNOSIS — E119 Type 2 diabetes mellitus without complications: Secondary | ICD-10-CM | POA: Diagnosis not present

## 2016-05-16 DIAGNOSIS — H401131 Primary open-angle glaucoma, bilateral, mild stage: Secondary | ICD-10-CM | POA: Diagnosis not present

## 2016-05-16 DIAGNOSIS — H5201 Hypermetropia, right eye: Secondary | ICD-10-CM | POA: Diagnosis not present

## 2016-05-16 LAB — HM DIABETES EYE EXAM

## 2016-06-15 ENCOUNTER — Other Ambulatory Visit: Payer: Self-pay | Admitting: Internal Medicine

## 2016-07-08 ENCOUNTER — Encounter: Payer: Self-pay | Admitting: Internal Medicine

## 2016-07-08 ENCOUNTER — Ambulatory Visit (INDEPENDENT_AMBULATORY_CARE_PROVIDER_SITE_OTHER): Payer: Medicare Other | Admitting: Internal Medicine

## 2016-07-08 DIAGNOSIS — E1121 Type 2 diabetes mellitus with diabetic nephropathy: Secondary | ICD-10-CM | POA: Diagnosis not present

## 2016-07-08 DIAGNOSIS — N181 Chronic kidney disease, stage 1: Secondary | ICD-10-CM | POA: Diagnosis not present

## 2016-07-08 DIAGNOSIS — E1340 Other specified diabetes mellitus with diabetic neuropathy, unspecified: Secondary | ICD-10-CM | POA: Diagnosis not present

## 2016-07-08 DIAGNOSIS — Z794 Long term (current) use of insulin: Secondary | ICD-10-CM

## 2016-07-08 DIAGNOSIS — E0822 Diabetes mellitus due to underlying condition with diabetic chronic kidney disease: Secondary | ICD-10-CM | POA: Diagnosis not present

## 2016-07-08 LAB — COMPREHENSIVE METABOLIC PANEL
ALT: 17 U/L (ref 0–53)
AST: 18 U/L (ref 0–37)
Albumin: 4.4 g/dL (ref 3.5–5.2)
Alkaline Phosphatase: 57 U/L (ref 39–117)
BILIRUBIN TOTAL: 0.6 mg/dL (ref 0.2–1.2)
BUN: 27 mg/dL — ABNORMAL HIGH (ref 6–23)
CHLORIDE: 107 meq/L (ref 96–112)
CO2: 28 mEq/L (ref 19–32)
Calcium: 10.1 mg/dL (ref 8.4–10.5)
Creatinine, Ser: 1.68 mg/dL — ABNORMAL HIGH (ref 0.40–1.50)
GFR: 41.01 mL/min — AB (ref >60.00)
GLUCOSE: 149 mg/dL — AB (ref 70–99)
POTASSIUM: 4.3 meq/L (ref 3.5–5.1)
Sodium: 142 mEq/L (ref 135–145)
Total Protein: 7.3 g/dL (ref 6.0–8.3)

## 2016-07-08 LAB — MICROALBUMIN / CREATININE URINE RATIO
Creatinine,U: 117 mg/dL
MICROALB/CREAT RATIO: 18.3 mg/g (ref 0.0–30.0)
Microalb, Ur: 21.4 mg/dL — ABNORMAL HIGH (ref 0.0–1.9)

## 2016-07-08 LAB — LIPID PANEL
CHOL/HDL RATIO: 3
Cholesterol: 123 mg/dL (ref 0–200)
HDL: 35.8 mg/dL — AB (ref >39.00)
LDL CALC: 57 mg/dL (ref 0–99)
NONHDL: 86.88
Triglycerides: 149 mg/dL (ref 0.0–149.0)
VLDL: 29.8 mg/dL (ref 0.0–40.0)

## 2016-07-08 LAB — POCT GLYCOSYLATED HEMOGLOBIN (HGB A1C): HEMOGLOBIN A1C: 7.2

## 2016-07-08 MED ORDER — FUROSEMIDE 20 MG PO TABS: 20.0000 mg | ORAL_TABLET | Freq: Every day | ORAL | 2 refills | Status: DC

## 2016-07-08 MED ORDER — INSULIN ASPART PROT & ASPART (70-30 MIX) 100 UNIT/ML PEN: PEN_INJECTOR | SUBCUTANEOUS | 1 refills | Status: DC

## 2016-07-08 NOTE — Assessment & Plan Note (Signed)
Insulin regimen lowered to 18 units in the am and increased to 44 units in the evening .  A1c due. Resolution of hypoglycemic events resulting from elimination of  lunchtime insulin.  DM still very well controlled for age and risks of adverse events lowered with slight loosening of control.   Lab Results  Component Value Date   HGBA1C 7.2 07/08/2016

## 2016-07-08 NOTE — Assessment & Plan Note (Signed)
Foot exam today notes a pressure ulcer  S tage 1

## 2016-07-08 NOTE — Patient Instructions (Addendum)
Your morning sugars are fine  I want you to check your sugar BEFORE DINNER and 2 HOURS AFTERWARD   SEND ME A WEEK OF READINGS VIA MYCHART .  CONTINUE 44 UNITS AT DINNERTIME DURING THIS PERIOD MAKE A NOTE OF WHAT TYPE OF DINNER (meat [place] ,  Pasta,  Soup) YOU HAD  SO WE CAN DETERMINE HOW MUCH EXTRA TO GIVE YOURSELF  DEPENDING ON WHAT YOUR EAT    Check your blood pressure once daily and send me those readings in a week as well.  Continue amlodipine and valsartan

## 2016-07-08 NOTE — Progress Notes (Signed)
Subjective:  Patient ID: [name], male    DOB: [birth year]  Age: 83 y.o. MRN: [id number]  CC: Diagnoses of Type 2 diabetes mellitus with diabetic nephropathy, with long-term current use of insulin ([place]), Diabetes mellitus due to underlying condition with stage 1 chronic kidney disease, with long-term current use of insulin ([place]), and Neuropathy due to secondary diabetes [facility]) were pertinent to this visit.  HPI [name] presents for 6 month follow up on type 2 DM with  And neuropathy, nephropathy, hypertension and   Hyperlipidemia   bp elevated today . Home readings USUALLY 120-130/ 60'S  BS "terrible"  Per patient.  He is taking 70/30 bid 20 units in AM and 44 units with dinner mornign cbs < 140,  Bedtime up to 223  Has developed a pressure ulcer on the top of his left 3rd toe. In the setting of neuropathy   Lab Results  Component Value Date   HGBA1C 7.2 07/08/2016   Lab Results  Component Value Date   MICROALBUR 21.4 (H) 07/08/2016      bs have been elevated at bedtie up to 230  ,  Using 44 units of 40/30 at dinner,  mornign fastings ranbge dforn 75 to 140   Takes 20 units in the morning  Has a stage I pressure ulcer on the top if 3rd toe left foot.  Due to a seam on the inside of his [place] shoe.  Does not feel it due ot neuropathy     Outpatient Medications Prior to Visit  Medication Sig Dispense Refill  . amLODipine (NORVASC) 10 MG tablet TAKE 1 TABLET (10 MG TOTAL) BY MOUTH DAILY. 90 tablet 2  . aspirin 325 MG tablet Take 325 mg by mouth daily.      Marland Kitchen atorvastatin (LIPITOR) 20 MG tablet TAKE 1 TABLET (20 MG TOTAL) BY MOUTH DAILY. 90 tablet 1  . B-D ULTRAFINE III SHORT PEN 31G X 8 MM MISC USE AS DIRECTED 3 TIMES A DAY 300 each 5  . Calcium Carbonate-Vitamin D (CALCIUM + D) 600-200 MG-UNIT TABS Take 1 tablet by mouth 2 (two) times daily.      . clopidogrel (PLAVIX) 75 MG tablet TAKE 1 TABLET (75 MG TOTAL) BY MOUTH DAILY. 90 tablet 1  . cyanocobalamin  (,VITAMIN B-12,) 1000 MCG/ML injection Inject 1 mL (1,000 mcg total) into the muscle every 30 (thirty) days. 10 mL 1  . fenofibrate (TRICOR) 48 MG tablet TAKE 1 TABLET BY MOUTH DAILY 90 tablet 1  . glucose blood (ACCU-CHEK AVIVA PLUS) test strip Check blood sugar 4 times daily, and PRN Dx E11.51 360 each 3  . hydrocortisone 2.5 % cream Apply 1 application topically 2 (two) times daily as needed.    . Insulin Syringe-Needle U-100 (B-D INS SYRINGE 0.5CC/31GX5/16) 31G X 5/16" 0.5 ML MISC 1 application by Does not apply route 3 (three) times daily before meals. 300 each 3  . latanoprost (XALATAN) 0.005 % ophthalmic solution Place 1 drop into both eyes at bedtime.  3  . Syringe, Disposable, 3 ML MISC For use with B12 injections weekly/monthly 25 each 0  . valsartan (DIOVAN) 160 MG tablet TAKE 1 TABLET BY MOUTH DAILY 90 tablet 1  . furosemide (LASIX) 20 MG tablet TAKE 1 TABLET EVERY OTHER DAY FOR FLUID RETENTION 15 tablet 5  . NOVOLOG MIX 70/30 FLEXPEN (70-30) 100 UNIT/ML FlexPen INJECT 21 UNITS IN THE AM AND 41 UNITS IN THE PM PRE MEAL 60 pen 1  . travoprost,  benzalkonium, (TRAVATAN) 0.004 % ophthalmic solution Place 1 drop into both eyes at bedtime.      No facility-administered medications prior to visit.     Review of Systems;  Patient denies headache, fevers, malaise, unintentional weight loss, skin rash, eye pain, sinus congestion and sinus pain, sore throat, dysphagia,  hemoptysis , cough, dyspnea, wheezing, chest pain, palpitations, orthopnea, edema, abdominal pain, nausea, melena, diarrhea, constipation, flank pain, dysuria, hematuria, urinary  Frequency, nocturia, numbness, tingling, seizures,  Focal weakness, Loss of consciousness,  Tremor, insomnia, depression, anxiety, and suicidal ideation.      Objective:  BP (!) 150/74 (BP Location: Left Arm, Patient Position: Sitting, Cuff Size: Normal)   Pulse 82   Temp 98.2 F (36.8 C) (Oral)   Resp 16   Ht 5\' 7"  (1.702 m)   Wt 189 lb 6.4 oz  (85.9 kg)   SpO2 97%   BMI 29.66 kg/m   BP Readings from Last 3 Encounters:  07/08/16 (!) 150/74  03/14/16 132/62  01/08/16 130/74    Wt Readings from Last 3 Encounters:  07/08/16 189 lb 6.4 oz (85.9 kg)  03/14/16 189 lb 1.9 oz (85.8 kg)  01/08/16 187 lb 8 oz (85 kg)    General appearance: alert, cooperative and appears stated age Ears: normal TM's and external ear canals both ears Throat: lips, mucosa, and tongue normal; teeth and gums normal Neck: no adenopathy, no carotid bruit, supple, symmetrical, trachea midline and thyroid not enlarged, symmetric, no tenderness/mass/nodules Back: symmetric, no curvature. ROM normal. No CVA tenderness. Lungs: clear to auscultation bilaterally Heart: regular rate and rhythm, S1, S2 normal, no murmur, click, rub or gallop Abdomen: soft, non-tender; bowel sounds normal; no masses,  no organomegaly Pulses: 2+ and symmetric Skin: Skin color, texture, turgor normal. No rashes or lesions Lymph nodes: Cervical, supraclavicular, and axillary nodes normal.  Lab Results  Component Value Date   HGBA1C 7.2 07/08/2016   HGBA1C 7.2 (H) 12/25/2015   HGBA1C 6.9 (H) 06/26/2015    Lab Results  Component Value Date   CREATININE 1.68 (H) 07/08/2016   CREATININE 1.87 (H) 12/25/2015   CREATININE 1.55 (H) 06/26/2015    Lab Results  Component Value Date   WBC 6.4 01/09/2011   HGB 13.0 01/09/2011   HCT 40.0 01/09/2011   PLT 215 01/09/2011   GLUCOSE 149 (H) 07/08/2016   CHOL 123 07/08/2016   TRIG 149.0 07/08/2016   HDL 35.80 (L) 07/08/2016   LDLDIRECT 49.0 06/26/2015   LDLCALC 57 07/08/2016   ALT 17 07/08/2016   AST 18 07/08/2016   NA 142 07/08/2016   K 4.3 07/08/2016   CL 107 07/08/2016   CREATININE 1.68 (H) 07/08/2016   BUN 27 (H) 07/08/2016   CO2 28 07/08/2016   TSH 1.49 06/13/2014   HGBA1C 7.2 07/08/2016   MICROALBUR 21.4 (H) 07/08/2016    Dg Chest 2 View  Result Date: 01/09/2011 *RADIOLOGY REPORT* Clinical Data: Preop back  surgery. CHEST - 2 VIEW Comparison: None. Findings: Post CABG.  Sequential pacemaker enters from the left with the tips at the level of the right atrium and right ventricle. Cardiomegaly. Central pulmonary vascular prominence. No infiltrate, congestive heart failure or pneumothorax. Calcified aorta.  Mild thoracic spine degenerative changes. IMPRESSION: Cardiomegaly with pacemaker in place. No infiltrate, congestive heart failure or pneumothorax. Original Report Authenticated By: Doug Sou, M.D.   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 ([place])  Relevant Medications   insulin aspart protamine - aspart (NOVOLOG MIX 70/30 FLEXPEN) (70-30) 100 UNIT/ML FlexPen   Other Relevant Orders   POCT HgB A1C (Completed)   Lipid panel (Completed)   Comprehensive metabolic panel (Completed)   Microalbumin / creatinine urine ratio (Completed)   Neuropathy due to secondary diabetes ([place])    Foot exam today notes a pressure ulcer  S tage 1      Relevant Medications   insulin aspart protamine - aspart (NOVOLOG MIX 70/30 FLEXPEN) (70-30) 100 UNIT/ML FlexPen   Diabetes mellitus with chronic kidney disease ([facility])    Insulin regimen lowered to 18 units in the am and increased to 44 units in the evening .  A1c due. Resolution of hypoglycemic events resulting from elimination of  lunchtime insulin.  DM still very well controlled for age and risks of adverse events lowered with slight loosening of control.   Lab Results  Component Value Date   HGBA1C 7.2 07/08/2016           Relevant Medications   insulin aspart protamine - aspart (NOVOLOG MIX 70/30 FLEXPEN) (70-30) 100 UNIT/ML FlexPen      I have discontinued Mr. Vallejo's travoprost (benzalkonium). I have changed his NOVOLOG MIX 70/30 FLEXPEN to insulin aspart protamine - aspart. I have also changed his furosemide. Additionally, I am having him maintain his  aspirin, Calcium Carbonate-Vitamin D, Insulin Syringe-Needle U-100, B-D ULTRAFINE III SHORT PEN, hydrocortisone, latanoprost, glucose blood, Syringe (Disposable), cyanocobalamin, amLODipine, atorvastatin, clopidogrel, valsartan, fenofibrate, and metoprolol tartrate.  Meds ordered this encounter  Medications  . metoprolol tartrate (LOPRESSOR) 50 MG tablet    Sig: Take 50 mg by mouth 2 (two) times daily.    Refill:  1  . furosemide (LASIX) 20 MG tablet    Sig: Take 1 tablet (20 mg total) by mouth daily. AS NEEDED FOR FLUID RETENTION    Dispense:  90 tablet    Refill:  2  . insulin aspart protamine - aspart (NOVOLOG MIX 70/30 FLEXPEN) (70-30) 100 UNIT/ML FlexPen    Sig: INJECT 40 units two times daily , adjust dose using sliding scale    Dispense:  75 pen    Refill:  1    Medications Discontinued During This Encounter  Medication Reason  . travoprost, benzalkonium, (TRAVATAN) 0.004 % ophthalmic solution Patient has not taken in last 30 days  . furosemide (LASIX) 20 MG tablet Reorder  . NOVOLOG MIX 70/30 FLEXPEN (70-30) 100 UNIT/ML FlexPen Reorder    Follow-up: No Follow-up on file.   Crecencio Mc, MD

## 2016-07-10 ENCOUNTER — Encounter: Payer: Self-pay | Admitting: Internal Medicine

## 2016-07-17 ENCOUNTER — Telehealth: Payer: Self-pay | Admitting: Internal Medicine

## 2016-07-17 NOTE — Telephone Encounter (Signed)
Dr.Tullo

## 2016-07-17 NOTE — Telephone Encounter (Signed)
Pt dropped off a note with blood surgar results. Papers are up front in Dr. Lupita Dawn color folder.

## 2016-07-24 ENCOUNTER — Other Ambulatory Visit: Payer: Self-pay | Admitting: Internal Medicine

## 2016-07-25 ENCOUNTER — Telehealth: Payer: Self-pay | Admitting: Internal Medicine

## 2016-07-25 NOTE — Telephone Encounter (Signed)
MyChart message sent  To increase evening insulin to 46 units,  Continue 18 units in the morning, and incraes metoprolol to 75 mg bid (1.5 tablets)

## 2016-07-28 ENCOUNTER — Other Ambulatory Visit: Payer: Self-pay | Admitting: Internal Medicine

## 2016-08-01 ENCOUNTER — Ambulatory Visit (INDEPENDENT_AMBULATORY_CARE_PROVIDER_SITE_OTHER): Payer: Medicare Other | Admitting: *Deleted

## 2016-08-01 ENCOUNTER — Encounter: Payer: Self-pay | Admitting: *Deleted

## 2016-08-01 VITALS — BP 138/60 | HR 66 | Resp 16

## 2016-08-01 DIAGNOSIS — I10 Essential (primary) hypertension: Secondary | ICD-10-CM

## 2016-08-01 NOTE — Progress Notes (Signed)
Patient presented today for BP check from My Chart Message sent By PCP on 07/25/16. Left arm 158/84 pulse 72 right arm 138/60 pulse 66. Patient stated he has noticed several times when checking his BP at home that his pressure are greater in the left arm than the right. Patient denies any symptoms no SOB, Chest pain or dizziness, some difference noted to day but not more than 20 points systolic and only 12 diastolic. Advised patient I would pass his concern to PCP. Patient also left copy of CBG readings in result folder.

## 2016-08-04 ENCOUNTER — Telehealth: Payer: Self-pay | Admitting: Internal Medicine

## 2016-08-04 NOTE — Telephone Encounter (Signed)
Sent via e mail:  I Received your one week log of pre and post prandial lunchtime CBGst.  only one post prandial sugar  is > 180.  Since  1/2 of pre meal cbs are 80 or less ,  And your last a1c was excellent for your age (see below),  Lab Results  Component Value Date   HGBA1C 7.2 07/08/2016    DO NOT increase your morning or evening  insulin dose  . No insulin changes are needed . You  should now be appreciative of that fact that vegetable soup without noodles is preferred,  and cheese crackers and sugar free ice cream are still going to raise  Your  blood sugars because of the natural carbohdrates they are all loaded with,   .

## 2016-08-05 NOTE — Telephone Encounter (Signed)
Pt stated that he received the email and understood everything.

## 2016-08-13 ENCOUNTER — Ambulatory Visit (INDEPENDENT_AMBULATORY_CARE_PROVIDER_SITE_OTHER): Payer: Medicare Other | Admitting: Vascular Surgery

## 2016-08-13 ENCOUNTER — Other Ambulatory Visit (INDEPENDENT_AMBULATORY_CARE_PROVIDER_SITE_OTHER): Payer: Self-pay | Admitting: Vascular Surgery

## 2016-08-13 ENCOUNTER — Ambulatory Visit (INDEPENDENT_AMBULATORY_CARE_PROVIDER_SITE_OTHER): Payer: Medicare Other

## 2016-08-13 ENCOUNTER — Encounter (INDEPENDENT_AMBULATORY_CARE_PROVIDER_SITE_OTHER): Payer: Self-pay | Admitting: Vascular Surgery

## 2016-08-13 VITALS — BP 156/72 | HR 77 | Resp 16 | Wt 190.4 lb

## 2016-08-13 DIAGNOSIS — I739 Peripheral vascular disease, unspecified: Secondary | ICD-10-CM

## 2016-08-13 DIAGNOSIS — E785 Hyperlipidemia, unspecified: Secondary | ICD-10-CM | POA: Diagnosis not present

## 2016-08-13 DIAGNOSIS — I10 Essential (primary) hypertension: Secondary | ICD-10-CM | POA: Diagnosis not present

## 2016-08-13 DIAGNOSIS — E1151 Type 2 diabetes mellitus with diabetic peripheral angiopathy without gangrene: Secondary | ICD-10-CM | POA: Diagnosis not present

## 2016-08-13 NOTE — Patient Instructions (Signed)

## 2016-08-13 NOTE — Progress Notes (Signed)
MRN : [id number]  [name] is a 83 y.o. ([birth year]) male who presents with chief complaint of  Chief Complaint  Patient presents with  . Re-evaluation    ultrasound follow up  .  History of Present Illness: Patient returns today in follow up of PAD. He is about 6 years status post right femoral popliteal bypass and the tibioperoneal trunk stent. He is doing well today. He denies any significant lifestyle limiting claudication, ischemic rest [name], or ulceration of either lower extremity. His noninvasive studies show essentially stable findings from his studies 1 year ago. His femoral to popliteal bypass and tibioperoneal trunk stent on the right are widely patent. His right digital waveforms are pretty good with digital pressures of 66. His left SFA has either a high-grade stenosis or occlusion with reduced digital pressures of 36 with blunted waveforms on the left. This is not significantly changed from a study one year ago.  Current Outpatient Prescriptions  Medication Sig Dispense Refill  . amLODipine (NORVASC) 10 MG tablet TAKE 1 TABLET (10 MG TOTAL) BY MOUTH DAILY. 90 tablet 2  . aspirin 325 MG tablet Take 325 mg by mouth daily.      Marland Kitchen atorvastatin (LIPITOR) 20 MG tablet TAKE 1 TABLET (20 MG TOTAL) BY MOUTH DAILY. 90 tablet 1  . B-D ULTRAFINE III SHORT PEN 31G X 8 MM MISC USE AS DIRECTED 3 TIMES A DAY 300 each 5  . Calcium Carbonate-Vitamin D (CALCIUM + D) 600-200 MG-UNIT TABS Take 1 tablet by mouth 2 (two) times daily.      . clopidogrel (PLAVIX) 75 MG tablet TAKE 1 TABLET (75 MG TOTAL) BY MOUTH DAILY. 90 tablet 1  . cyanocobalamin (,VITAMIN B-12,) 1000 MCG/ML injection Inject 1 mL (1,000 mcg total) into the muscle every 30 (thirty) days. 10 mL 1  . fenofibrate (TRICOR) 48 MG tablet TAKE 1 TABLET BY MOUTH DAILY 90 tablet 1  . furosemide (LASIX) 20 MG tablet Take 1 tablet (20 mg total) by mouth daily. AS NEEDED FOR FLUID RETENTION 90 tablet 2  . glucose blood (ACCU-CHEK AVIVA  PLUS) test strip Check blood sugar 4 times daily, and PRN Dx E11.51 360 each 3  . hydrocortisone 2.5 % cream Apply 1 application topically 2 (two) times daily as needed.    . insulin aspart protamine - aspart (NOVOLOG MIX 70/30 FLEXPEN) (70-30) 100 UNIT/ML FlexPen INJECT 40 units two times daily , adjust dose using sliding scale 75 pen 1  . Insulin Syringe-Needle U-100 (B-D INS SYRINGE 0.5CC/31GX5/16) 31G X 5/16" 0.5 ML MISC 1 application by Does not apply route 3 (three) times daily before meals. 300 each 3  . latanoprost (XALATAN) 0.005 % ophthalmic solution Place 1 drop into both eyes at bedtime.  3  . metoprolol tartrate (LOPRESSOR) 50 MG tablet Take 50 mg by mouth 2 (two) times daily.  1  . Syringe, Disposable, 3 ML MISC For use with B12 injections weekly/monthly 25 each 0  . valsartan (DIOVAN) 160 MG tablet TAKE 1 TABLET BY MOUTH DAILY 90 tablet 1  . metoprolol tartrate (LOPRESSOR) 50 MG tablet TAKE 1 TABLET BY MOUTH TWICE A DAY (Patient not taking: Reported on 08/13/2016) 180 tablet 1   No current facility-administered medications for this visit.     Past Medical History:  Diagnosis Date  . Blood transfusion   . Coronary artery disease    h/o bypass coronary- 2001 & peripheral- 2011 , last full cardiac visit  in [place] , [place], [facility]  . Diabetes mellitus   . H/O: malaria 82   in [place]  . Hyperlipidemia   . Hypertension   . Peripheral artery disease ([place])    folowed by [name] post bypass 2011 [place]  . Renal insufficiency    by Nov 2012 labs, no old records available    Past Surgical History:  Procedure Laterality Date  . CORONARY ARTERY BYPASS GRAFT  2001   4 vessel, done in [place]   . FEMORAL BYPASS  2011   done in [place],  now followed by [name]  . INSERT / REPLACE / REMOVE PACEMAKER     2004  . LUMBAR LAMINECTOMY/DECOMPRESSION MICRODISCECTOMY  01/16/2011   Procedure: LUMBAR LAMINECTOMY/DECOMPRESSION MICRODISCECTOMY;  Surgeon: Ophelia Charter;   Location: [facility];  Service: Neurosurgery;  Laterality: N/A;  Lumbar four and lumbar five laminectomies   . PACEMAKER INSERTION    . SPINE SURGERY      Social History Social History  Substance Use Topics  . Smoking status: Former Smoker    Quit date: 09/27/1985  . Smokeless tobacco: Never Used  . Alcohol use No      Family History Family History  Problem Relation Age of Onset  . Mental illness Mother 76       alzheimers  . Peripheral vascular disease Father   . Diabetes Father   . Anesthesia problems Neg Hx   . Hypotension Neg Hx   . Malignant hyperthermia Neg Hx   . Pseudochol deficiency Neg Hx      No Known Allergies   REVIEW OF SYSTEMS (Negative unless checked)  Constitutional: [] Weight loss  [] Fever  [] Chills Cardiac: [] Chest [name]   [] Chest pressure   [] Palpitations   [] Shortness of breath when laying flat   [] Shortness of breath at rest   [] Shortness of breath with exertion. Vascular:  [] [name] in legs with walking   [] [name] in legs at rest   [] [name] in legs when laying flat   [] Claudication   [] [name] in feet when walking  [] [name] in feet at rest  [] [name] in feet when laying flat   [] History of DVT   [] Phlebitis   [] Swelling in legs   [] Varicose veins   [] Non-healing ulcers Pulmonary:   [] Uses home oxygen   [] Productive cough   [] Hemoptysis   [] Wheeze  [] COPD   [] Asthma Neurologic:  [] Dizziness  [] Blackouts   [] Seizures   [] History of stroke   [] History of TIA  [] Aphasia   [] Temporary blindness   [] Dysphagia   [] Weakness or numbness in arms   [] Weakness or numbness in legs Musculoskeletal:  [x] Arthritis   [] Joint swelling   [] Joint [name]   [] Low back [name] Hematologic:  [] Easy bruising  [] Easy bleeding   [] Hypercoagulable state   [] Anemic   Gastrointestinal:  [] Blood in stool   [] Vomiting blood  [] Gastroesophageal reflux/heartburn   [] Abdominal [name] Genitourinary:  [x] Chronic kidney disease   [] Difficult urination  [] Frequent urination  [] Burning with urination    [] Hematuria Skin:  [] Rashes   [] Ulcers   [] Wounds Psychological:  [] History of anxiety   []  History of major depression.  Physical Examination  BP (!) 156/72   Pulse 77   Resp 16   Wt 190 lb 6.4 oz (86.4 kg)   BMI 29.82 kg/m  Gen:  WD/WN, NAD. Appears much younger than stated age Head: [place]/AT, No temporalis wasting. Ear/Nose/Throat: Hearing grossly intact, nares w/o erythema or drainage, trachea midline Eyes: Conjunctiva clear. Sclera non-icteric Neck: Supple.  No JVD.  Pulmonary:  Good air movement, no use of accessory muscles.  Cardiac: RRR Vascular:  Vessel Right Left  Radial Palpable Palpable                      Popliteal Palpable Not Palpable  PT Palpable 1+ Palpable  DP Trace Palpable Not Palpable    Musculoskeletal: M/S 5/5 throughout.  No deformity or atrophy. Mild bilateral lower extremity edema. Neurologic: Sensation grossly intact in extremities.  Symmetrical.  Speech is fluent.  Psychiatric: Judgment intact, Mood & affect appropriate for pt's clinical situation. Dermatologic: No rashes or ulcers noted.  No cellulitis or open wounds.       Labs Recent Results (from the past 2160 hour(s))  HM DIABETES EYE EXAM     Status: None   Collection Time: 05/16/16 12:00 AM  Result Value Ref Range   HM Diabetic Eye Exam No Retinopathy No Retinopathy  POCT HgB A1C     Status: None   Collection Time: 07/08/16  1:54 PM  Result Value Ref Range   Hemoglobin A1C 7.2   Lipid panel     Status: Abnormal   Collection Time: 07/08/16  2:01 PM  Result Value Ref Range   Cholesterol 123 0 - 200 mg/dL    Comment: ATP III Classification       Desirable:  < 200 mg/dL               Borderline High:  200 - 239 mg/dL          High:  > = 240 mg/dL   Triglycerides 149.0 0.0 - 149.0 mg/dL    Comment: Normal:  <150 mg/dLBorderline High:  150 - 199 mg/dL   HDL 35.80 (L) >39.00 mg/dL   VLDL 29.8 0.0 - 40.0 mg/dL   LDL Cholesterol 57 0 - 99 mg/dL   Total CHOL/HDL Ratio 3      Comment:                Men          Women1/2 Average Risk     3.4          3.3Average Risk          5.0          4.42X Average Risk          9.6          7.13X Average Risk          15.0          11.0                       NonHDL 86.88     Comment: NOTE:  Non-HDL goal should be 30 mg/dL higher than patient's LDL goal (i.e. LDL goal of < 70 mg/dL, would have non-HDL goal of < 100 mg/dL)  Comprehensive metabolic panel     Status: Abnormal   Collection Time: 07/08/16  2:01 PM  Result Value Ref Range   Sodium 142 135 - 145 mEq/L   Potassium 4.3 3.5 - 5.1 mEq/L   Chloride 107 96 - 112 mEq/L   CO2 28 19 - 32 mEq/L   Glucose, Bld 149 (H) 70 - 99 mg/dL   BUN 27 (H) 6 - 23 mg/dL   Creatinine, Ser 1.68 (H) 0.40 - 1.50 mg/dL   Total Bilirubin 0.6 0.2 - 1.2 mg/dL   Alkaline Phosphatase 57 39 - 117 U/L   AST 18  0 - 37 U/L   ALT 17 0 - 53 U/L   Total Protein 7.3 6.0 - 8.3 g/dL   Albumin 4.4 3.5 - 5.2 g/dL   Calcium 10.1 8.4 - 10.5 mg/dL   GFR 41.01 (L) >60.00 mL/min  Microalbumin / creatinine urine ratio     Status: Abnormal   Collection Time: 07/08/16  2:01 PM  Result Value Ref Range   Microalb, Ur 21.4 (H) 0.0 - 1.9 mg/dL   Creatinine,U 117.0 mg/dL   Microalb Creat Ratio 18.3 0.0 - 30.0 mg/g  VAS [place] LOWER EXTREMITY ARTERIAL DUPLEX     Status: None (In process)   Collection Time: 08/13/16  2:37 PM  Result Value Ref Range   Left super femoral prox sys PSV 22 cm/s   Left super femoral mid sys PSV -32 cm/s   Left super femoral dist sys PSV -54 cm/s   Left popliteal prox sys PSV 40 cm/s   Left popliteal dist sys PSV -50 cm/s   Right popliteal dist sys PSV -50 cm/s   Right peroneal sys PSV 100 cm/s   LEFT PERO DIST SYS 30.00 cm/s   RIGHT ANT DIST TIBAL SYS PSV 54 cm/s   RIGHT POST TIB DIST SYS 37 cm/s    Radiology No results found.   Assessment/Plan  Diabetes mellitus with peripheral vascular disease (HCC) blood glucose control important in reducing the progression of atherosclerotic  disease. Also, involved in wound healing. On appropriate medications.   Hypertension blood pressure control important in reducing the progression of atherosclerotic disease. On appropriate oral medications.   Hyperlipidemia with target LDL less than 70 lipid control important in reducing the progression of atherosclerotic disease. Continue statin therapy   PVD (peripheral vascular disease) ([place]) His noninvasive studies show essentially stable findings from his studies 1 year ago. His femoral to popliteal bypass and tibioperoneal trunk stent on the right are widely patent. His right digital waveforms are pretty good with digital pressures of 66. His left SFA has either a high-grade stenosis or occlusion with reduced digital pressures of 36 with blunted waveforms on the left. This is not significantly changed from a study one year ago. He really does not have any significant symptoms and no intervention would currently be recommended. Plan to recheck in 1 year. He'll continue his current medical regimen including aspirin, Plavix, and Lipitor.    [name] Pain, MD  08/13/2016 3:41 PM    This note was created with Dragon medical transcription system.  Any errors from dictation are purely unintentional

## 2016-08-13 NOTE — Assessment & Plan Note (Signed)
blood pressure control important in reducing the progression of atherosclerotic disease. On appropriate oral medications.  

## 2016-08-13 NOTE — Assessment & Plan Note (Signed)
lipid control important in reducing the progression of atherosclerotic disease. Continue statin therapy  

## 2016-08-13 NOTE — Assessment & Plan Note (Signed)
blood glucose control important in reducing the progression of atherosclerotic disease. Also, involved in wound healing. On appropriate medications.  

## 2016-08-13 NOTE — Assessment & Plan Note (Signed)
His noninvasive studies show essentially stable findings from his studies 1 year ago. His femoral to popliteal bypass and tibioperoneal trunk stent on the right are widely patent. His right digital waveforms are pretty good with digital pressures of 66. His left SFA has either a high-grade stenosis or occlusion with reduced digital pressures of 36 with blunted waveforms on the left. This is not significantly changed from a study one year ago. He really does not have any significant symptoms and no intervention would currently be recommended. Plan to recheck in 1 year. He'll continue his current medical regimen including aspirin, Plavix, and Lipitor.

## 2016-08-15 DIAGNOSIS — Z961 Presence of intraocular lens: Secondary | ICD-10-CM | POA: Diagnosis not present

## 2016-08-15 DIAGNOSIS — H401131 Primary open-angle glaucoma, bilateral, mild stage: Secondary | ICD-10-CM | POA: Diagnosis not present

## 2016-09-03 ENCOUNTER — Other Ambulatory Visit: Payer: Self-pay | Admitting: Internal Medicine

## 2016-09-24 ENCOUNTER — Telehealth: Payer: Self-pay

## 2016-09-24 MED ORDER — LOSARTAN POTASSIUM 100 MG PO TABS: 100.0000 mg | ORAL_TABLET | Freq: Every day | ORAL | 0 refills | Status: DC

## 2016-09-24 NOTE — Telephone Encounter (Signed)
Pt is currently taking valsartan 160mg . Please advise.

## 2016-09-24 NOTE — Telephone Encounter (Signed)
LOSARTAN 100 MG  DAILY INSTEAD  90 DAYS SENT TO LOCAL [facility]

## 2016-10-04 ENCOUNTER — Other Ambulatory Visit: Payer: Self-pay | Admitting: Internal Medicine

## 2016-10-09 ENCOUNTER — Ambulatory Visit: Payer: Self-pay | Admitting: Internal Medicine

## 2016-10-09 ENCOUNTER — Encounter: Payer: Self-pay | Admitting: Internal Medicine

## 2016-10-09 ENCOUNTER — Ambulatory Visit (INDEPENDENT_AMBULATORY_CARE_PROVIDER_SITE_OTHER): Payer: Medicare Other | Admitting: Internal Medicine

## 2016-10-09 VITALS — BP 144/78 | HR 85 | Temp 97.8°F | Resp 15 | Ht 67.0 in | Wt 185.6 lb

## 2016-10-09 DIAGNOSIS — E1121 Type 2 diabetes mellitus with diabetic nephropathy: Secondary | ICD-10-CM | POA: Diagnosis not present

## 2016-10-09 DIAGNOSIS — Z794 Long term (current) use of insulin: Secondary | ICD-10-CM

## 2016-10-09 DIAGNOSIS — E1151 Type 2 diabetes mellitus with diabetic peripheral angiopathy without gangrene: Secondary | ICD-10-CM | POA: Diagnosis not present

## 2016-10-09 DIAGNOSIS — I10 Essential (primary) hypertension: Secondary | ICD-10-CM | POA: Diagnosis not present

## 2016-10-09 DIAGNOSIS — Z23 Encounter for immunization: Secondary | ICD-10-CM

## 2016-10-09 DIAGNOSIS — F5101 Primary insomnia: Secondary | ICD-10-CM | POA: Diagnosis not present

## 2016-10-09 MED ORDER — TRAZODONE HCL 50 MG PO TABS: 25.0000 mg | ORAL_TABLET | Freq: Every evening | ORAL | 3 refills | Status: DC | PRN

## 2016-10-09 NOTE — Patient Instructions (Addendum)
I recommend a trial of trazodone to help you sleep  Take it one hour before bedtime ,  Ok to start with 1/2 tablet      Continue 75 mg metoprolol twice daily and 100 mg losartan daily for blood pressure

## 2016-10-09 NOTE — Progress Notes (Signed)
Subjective:  Patient ID: [name], male    DOB: Jun 08, 1924  Age: 83 y.o. MRN: [id number]  CC: The primary encounter diagnosis was Diabetes mellitus with peripheral vascular disease ([place]). Diagnoses of Encounter for immunization, Type 2 diabetes mellitus with diabetic nephropathy, with long-term current use of insulin ([place]), Primary insomnia, and Essential hypertension were also pertinent to this visit.  HPI [name] presents for follow up on type 2 DM  Lab Results  Component Value Date   HGBA1C 7.1 (H) 10/09/2016   Recurrent lows before lunch time , but only 3-4  Since last visit  Taking 21 units in the am and 46 at night   Left lateral thigh became itcchy and painful  last Saturday night , occurred while sitting outside bird watching  Leg was pressing against wrought iron .  Now resolved.   Worried about shingles,    Has had chronic insomnia for years/   Averages 3 hours of sleep before waking . Can't go back to sleep unless he eats breakfast and takes his pills .  Blood sugar has been checked and not low  Goes to bed at 2 am .  Eats at 6   Hypertension : Home bps 120 to 130 at home on 75 mg metoprolol twice daily and losartan 100 mg daily  .   Outpatient Medications Prior to Visit  Medication Sig Dispense Refill  . ACCU-CHEK AVIVA PLUS test strip CHECK BLOOD SUGAR 4 TIMES A DAY AS NEEDED DX E1.51 400 each 01  . amLODipine (NORVASC) 10 MG tablet TAKE 1 TABLET (10 MG TOTAL) BY MOUTH DAILY. 90 tablet 2  . aspirin 325 MG tablet Take 325 mg by mouth daily.      Marland Kitchen atorvastatin (LIPITOR) 20 MG tablet TAKE 1 TABLET (20 MG TOTAL) BY MOUTH DAILY. 90 tablet 1  . B-D ULTRAFINE III SHORT PEN 31G X 8 MM MISC USE AS DIRECTED 3 TIMES A DAY 300 each 5  . Calcium Carbonate-Vitamin D (CALCIUM + D) 600-200 MG-UNIT TABS Take 1 tablet by mouth 2 (two) times daily.      . clopidogrel (PLAVIX) 75 MG tablet TAKE 1 TABLET (75 MG TOTAL) BY MOUTH DAILY. 90 tablet 1  . cyanocobalamin (,VITAMIN  B-12,) 1000 MCG/ML injection INJECT 1 ML (1,000 MCG TOTAL) INTO THE MUSCLE EVERY 30 (THIRTY) DAYS. 10 mL 0  . fenofibrate (TRICOR) 48 MG tablet TAKE 1 TABLET BY MOUTH DAILY 90 tablet 1  . furosemide (LASIX) 20 MG tablet Take 1 tablet (20 mg total) by mouth daily. AS NEEDED FOR FLUID RETENTION 90 tablet 2  . hydrocortisone 2.5 % cream Apply 1 application topically 2 (two) times daily as needed.    . insulin aspart protamine - aspart (NOVOLOG MIX 70/30 FLEXPEN) (70-30) 100 UNIT/ML FlexPen INJECT 40 units two times daily , adjust dose using sliding scale 75 pen 1  . Insulin Syringe-Needle U-100 (B-D INS SYRINGE 0.5CC/31GX5/16) 31G X 5/16" 0.5 ML MISC 1 application by Does not apply route 3 (three) times daily before meals. 300 each 3  . latanoprost (XALATAN) 0.005 % ophthalmic solution Place 1 drop into both eyes at bedtime.  3  . losartan (COZAAR) 100 MG tablet Take 1 tablet (100 mg total) by mouth daily. 90 tablet 0  . metoprolol tartrate (LOPRESSOR) 50 MG tablet Take 50 mg by mouth 2 (two) times daily.  1  . metoprolol tartrate (LOPRESSOR) 50 MG tablet TAKE 1 TABLET BY MOUTH TWICE A DAY 180 tablet  1  . Syringe, Disposable, 3 ML MISC For use with B12 injections weekly/monthly 25 each 0  . valsartan (DIOVAN) 160 MG tablet TAKE 1 TABLET BY MOUTH DAILY 90 tablet 1   No facility-administered medications prior to visit.     Review of Systems;  Patient denies headache, fevers, malaise, unintentional weight loss, skin rash, eye pain, sinus congestion and sinus pain, sore throat, dysphagia,  hemoptysis , cough, dyspnea, wheezing, chest pain, palpitations, orthopnea, edema, abdominal pain, nausea, melena, diarrhea, constipation, flank pain, dysuria, hematuria, urinary  Frequency, nocturia, numbness, tingling, seizures,  Focal weakness, Loss of consciousness,  Tremor, insomnia, depression, anxiety, and suicidal ideation.      Objective:  BP (!) 144/78 (BP Location: Left Arm, Patient Position: Sitting,  Cuff Size: Normal)   Pulse 85   Temp 97.8 F (36.6 C) (Oral)   Resp 15   Ht 5\' 7"  (1.702 m)   Wt 185 lb 9.6 oz (84.2 kg)   SpO2 97%   BMI 29.07 kg/m   BP Readings from Last 3 Encounters:  10/09/16 (!) 144/78  08/13/16 (!) 156/72  08/01/16 138/60    Wt Readings from Last 3 Encounters:  10/09/16 185 lb 9.6 oz (84.2 kg)  08/13/16 190 lb 6.4 oz (86.4 kg)  07/08/16 189 lb 6.4 oz (85.9 kg)    General appearance: alert, cooperative and appears stated age Ears: normal TM's and external ear canals both ears Throat: lips, mucosa, and tongue normal; teeth and gums normal Neck: no adenopathy, no carotid bruit, supple, symmetrical, trachea midline and thyroid not enlarged, symmetric, no tenderness/mass/nodules Back: symmetric, no curvature. ROM normal. No CVA tenderness. Lungs: clear to auscultation bilaterally Heart: regular rate and rhythm, S1, S2 normal, no murmur, click, rub or gallop Abdomen: soft, non-tender; bowel sounds normal; no masses,  no organomegaly Pulses: 2+ and symmetric Skin: Skin color, texture, turgor normal. No rashes or lesions Lymph nodes: Cervical, supraclavicular, and axillary nodes normal.  Lab Results  Component Value Date   HGBA1C 7.1 (H) 10/09/2016   HGBA1C 7.2 07/08/2016   HGBA1C 7.2 (H) 12/25/2015    Lab Results  Component Value Date   CREATININE 1.77 (H) 10/09/2016   CREATININE 1.68 (H) 07/08/2016   CREATININE 1.87 (H) 12/25/2015    Lab Results  Component Value Date   WBC 6.4 01/09/2011   HGB 13.0 01/09/2011   HCT 40.0 01/09/2011   PLT 215 01/09/2011   GLUCOSE 81 10/09/2016   CHOL 123 07/08/2016   TRIG 149.0 07/08/2016   HDL 35.80 (L) 07/08/2016   LDLDIRECT 49.0 06/26/2015   LDLCALC 57 07/08/2016   ALT 16 10/09/2016   AST 20 10/09/2016   NA 139 10/09/2016   K 4.7 10/09/2016   CL 104 10/09/2016   CREATININE 1.77 (H) 10/09/2016   BUN 24 (H) 10/09/2016   CO2 25 10/09/2016   TSH 1.49 06/13/2014   HGBA1C 7.1 (H) 10/09/2016    MICROALBUR 21.4 (H) 07/08/2016     Assessment & Plan:   Problem List Items Addressed This Visit    Diabetes mellitus with peripheral vascular disease ([place]) - Primary   Relevant Orders   Hemoglobin A1c (Completed)   Comprehensive metabolic panel (Completed)   Hypertension    Well controlled on current regimen. Renal function stable, no changes today.  Lab Results  Component Value Date   CREATININE 1.77 (H) 10/09/2016   Lab Results  Component Value Date   NA 139 10/09/2016   K 4.7 10/09/2016   CL 104 10/09/2016  CO2 25 10/09/2016         Insomnia    Trial of trazodone offered.       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  A1c improved . Improvement in frequency of hypoglycemic events resulting from elimination of  lunchtime insulin.  DM still very well controlled for age and risks of adverse events lowered with slight loosening of control.   Lab Results  Component Value Date   HGBA1C 7.1 (H) 10/09/2016            Other Visit Diagnoses    Encounter for immunization       Relevant Orders   Flu vaccine HIGH DOSE PF (Completed)     A total of 25 minutes of face to face time was spent with patient more than half of which was spent in counselling about the above mentioned conditions  and coordination of care  I am having Mr. Wanzer start on traZODone. I am also having him maintain his aspirin, Calcium Carbonate-Vitamin D, Insulin Syringe-Needle U-100, B-D ULTRAFINE III SHORT PEN, hydrocortisone, latanoprost, Syringe (Disposable), amLODipine, metoprolol tartrate, furosemide, insulin aspart protamine - aspart, fenofibrate, metoprolol tartrate, clopidogrel, valsartan, atorvastatin, ACCU-CHEK AVIVA PLUS, losartan, and cyanocobalamin.  Meds ordered this encounter  Medications  . traZODone (DESYREL) 50 MG tablet    Sig: Take 0.5-1 tablets (25-50 mg total)  by mouth at bedtime as needed for sleep.    Dispense:  30 tablet    Refill:  3    There are no discontinued medications.  Follow-up: Return in about 3 months (around 01/08/2017) for follow up diabetes.   Crecencio Mc, MD

## 2016-10-10 LAB — HEMOGLOBIN A1C: Hgb A1c MFr Bld: 7.1 % — ABNORMAL HIGH (ref 4.6–6.5)

## 2016-10-10 LAB — COMPREHENSIVE METABOLIC PANEL
ALBUMIN: 4.4 g/dL (ref 3.5–5.2)
ALT: 16 U/L (ref 0–53)
AST: 20 U/L (ref 0–37)
Alkaline Phosphatase: 56 U/L (ref 39–117)
BUN: 24 mg/dL — ABNORMAL HIGH (ref 6–23)
CALCIUM: 10.3 mg/dL (ref 8.4–10.5)
CHLORIDE: 104 meq/L (ref 96–112)
CO2: 25 mEq/L (ref 19–32)
CREATININE: 1.77 mg/dL — AB (ref 0.40–1.50)
GFR: 38.59 mL/min — ABNORMAL LOW (ref >60.00)
Glucose, Bld: 81 mg/dL (ref 70–99)
Potassium: 4.7 mEq/L (ref 3.5–5.1)
Sodium: 139 mEq/L (ref 135–145)
Total Bilirubin: 0.7 mg/dL (ref 0.2–1.2)
Total Protein: 7.4 g/dL (ref 6.0–8.3)

## 2016-10-10 NOTE — Assessment & Plan Note (Signed)
Insulin  Increased to 21units in the am and increased to 46 units in the evening .  A1c improved . Improvement in frequency of hypoglycemic events resulting from elimination of  lunchtime insulin.  DM still very well controlled for age and risks of adverse events lowered with slight loosening of control.   Lab Results  Component Value Date   HGBA1C 7.1 (H) 10/09/2016

## 2016-10-10 NOTE — Assessment & Plan Note (Signed)
Trial of trazodone offered.

## 2016-10-10 NOTE — Assessment & Plan Note (Signed)
Well controlled on current regimen. Renal function stable, no changes today.  Lab Results  Component Value Date   CREATININE 1.77 (H) 10/09/2016   Lab Results  Component Value Date   NA 139 10/09/2016   K 4.7 10/09/2016   CL 104 10/09/2016   CO2 25 10/09/2016

## 2016-10-13 ENCOUNTER — Encounter: Payer: Self-pay | Admitting: Internal Medicine

## 2016-10-17 DIAGNOSIS — Z951 Presence of aortocoronary bypass graft: Secondary | ICD-10-CM | POA: Diagnosis not present

## 2016-10-17 DIAGNOSIS — I739 Peripheral vascular disease, unspecified: Secondary | ICD-10-CM | POA: Diagnosis not present

## 2016-10-17 DIAGNOSIS — I350 Nonrheumatic aortic (valve) stenosis: Secondary | ICD-10-CM | POA: Diagnosis not present

## 2016-10-17 DIAGNOSIS — I10 Essential (primary) hypertension: Secondary | ICD-10-CM | POA: Diagnosis not present

## 2016-10-17 DIAGNOSIS — E119 Type 2 diabetes mellitus without complications: Secondary | ICD-10-CM | POA: Diagnosis not present

## 2016-10-17 DIAGNOSIS — I2510 Atherosclerotic heart disease of native coronary artery without angina pectoris: Secondary | ICD-10-CM | POA: Diagnosis not present

## 2016-10-17 DIAGNOSIS — I701 Atherosclerosis of renal artery: Secondary | ICD-10-CM | POA: Diagnosis not present

## 2016-10-17 DIAGNOSIS — I495 Sick sinus syndrome: Secondary | ICD-10-CM | POA: Diagnosis not present

## 2016-10-17 DIAGNOSIS — R011 Cardiac murmur, unspecified: Secondary | ICD-10-CM | POA: Diagnosis not present

## 2016-10-17 DIAGNOSIS — E784 Other hyperlipidemia: Secondary | ICD-10-CM | POA: Diagnosis not present

## 2016-10-21 ENCOUNTER — Other Ambulatory Visit: Payer: Self-pay | Admitting: Internal Medicine

## 2016-10-30 NOTE — Telephone Encounter (Signed)
Error

## 2016-11-30 ENCOUNTER — Other Ambulatory Visit: Payer: Self-pay | Admitting: Internal Medicine

## 2016-12-03 ENCOUNTER — Telehealth: Payer: Self-pay

## 2016-12-03 ENCOUNTER — Telehealth: Payer: Self-pay | Admitting: Internal Medicine

## 2016-12-03 MED ORDER — METOPROLOL TARTRATE 50 MG PO TABS: 75.0000 mg | ORAL_TABLET | Freq: Two times a day (BID) | ORAL | 1 refills | Status: DC

## 2016-12-03 NOTE — Telephone Encounter (Signed)
Medication has been refilled with correct dose and instructions per Dr. Lupita Dawn last office note.

## 2016-12-03 NOTE — Telephone Encounter (Signed)
Pt came in and drop off his medications that needs to be refilled.  It's in [place] orange folder. Thank you!

## 2016-12-04 MED ORDER — METOPROLOL TARTRATE 50 MG PO TABS: 75.0000 mg | ORAL_TABLET | Freq: Two times a day (BID) | ORAL | 1 refills | Status: DC

## 2016-12-04 NOTE — Telephone Encounter (Signed)
Medication has been resent to correct pharmacy.  

## 2016-12-22 ENCOUNTER — Other Ambulatory Visit: Payer: Self-pay | Admitting: Internal Medicine

## 2016-12-24 DIAGNOSIS — H524 Presbyopia: Secondary | ICD-10-CM | POA: Diagnosis not present

## 2016-12-24 DIAGNOSIS — H52223 Regular astigmatism, bilateral: Secondary | ICD-10-CM | POA: Diagnosis not present

## 2016-12-24 DIAGNOSIS — H401131 Primary open-angle glaucoma, bilateral, mild stage: Secondary | ICD-10-CM | POA: Diagnosis not present

## 2016-12-24 DIAGNOSIS — H5203 Hypermetropia, bilateral: Secondary | ICD-10-CM | POA: Diagnosis not present

## 2016-12-24 DIAGNOSIS — Z961 Presence of intraocular lens: Secondary | ICD-10-CM | POA: Diagnosis not present

## 2017-01-08 ENCOUNTER — Other Ambulatory Visit: Payer: Self-pay

## 2017-01-08 ENCOUNTER — Ambulatory Visit (INDEPENDENT_AMBULATORY_CARE_PROVIDER_SITE_OTHER): Payer: Medicare Other | Admitting: Internal Medicine

## 2017-01-08 ENCOUNTER — Encounter: Payer: Self-pay | Admitting: Internal Medicine

## 2017-01-08 VITALS — BP 150/78 | HR 84 | Temp 97.8°F | Resp 15 | Ht 67.0 in | Wt 187.6 lb

## 2017-01-08 DIAGNOSIS — N183 Chronic kidney disease, stage 3 unspecified: Secondary | ICD-10-CM

## 2017-01-08 DIAGNOSIS — E785 Hyperlipidemia, unspecified: Secondary | ICD-10-CM

## 2017-01-08 DIAGNOSIS — E0822 Diabetes mellitus due to underlying condition with diabetic chronic kidney disease: Secondary | ICD-10-CM

## 2017-01-08 DIAGNOSIS — N181 Chronic kidney disease, stage 1: Secondary | ICD-10-CM | POA: Diagnosis not present

## 2017-01-08 DIAGNOSIS — Z794 Long term (current) use of insulin: Secondary | ICD-10-CM

## 2017-01-08 DIAGNOSIS — I10 Essential (primary) hypertension: Secondary | ICD-10-CM

## 2017-01-08 DIAGNOSIS — E1151 Type 2 diabetes mellitus with diabetic peripheral angiopathy without gangrene: Secondary | ICD-10-CM | POA: Diagnosis not present

## 2017-01-08 MED ORDER — SYRINGE (DISPOSABLE) 3 ML MISC: 0 refills | Status: AC

## 2017-01-08 MED ORDER — ZOSTER VAC RECOMB ADJUVANTED 50 MCG/0.5ML IM SUSR: 0.5000 mL | Freq: Once | INTRAMUSCULAR | 1 refills | Status: AC

## 2017-01-08 MED ORDER — METOPROLOL TARTRATE 100 MG PO TABS: 100.0000 mg | ORAL_TABLET | Freq: Two times a day (BID) | ORAL | 1 refills | Status: DC

## 2017-01-08 MED ORDER — "INSULIN SYRINGE-NEEDLE U-100 31G X 5/16"" 0.5 ML MISC": 1.0000 "application " | Freq: Three times a day (TID) | 3 refills | Status: AC

## 2017-01-08 NOTE — Progress Notes (Signed)
Subjective:  Patient ID: [name], male    DOB: Apr 03, 1924  Age: 83 y.o. MRN: [id number]  CC: The primary encounter diagnosis was CKD (chronic kidney disease) stage 3, GFR 30-59 ml/min ([place]). Diagnoses of Diabetes mellitus with peripheral vascular disease ([place]), Hyperlipidemia with target LDL less than 70, Diabetes mellitus due to underlying condition with stage 1 chronic kidney disease, with long-term current use of insulin ([place]), and Essential hypertension were also pertinent to this visit.  HPI [name] presents for 3 month follow up on diabetes.  Patient has no complaints today.  Patient is following a low glycemic index diet and taking all prescribed medications regularly without side effects.  Fasting sugars have been under less than 140 most of the time and post prandials have been under 160 except on rare occasions. Patient is exercising about 5 times per week .  Patient has had an eye exam in the last 12 months and checks feet regularly for signs of infection.  Patient does not walk barefoot outside,  And denies any worsening numbness tingling or burning in feet. Patient is up to date on all recommended vaccinations   21 units NPH  IN AM AND 41 units  AT NIGHT  2) Insomnia:  TRAZODONE CAUSED TACHYCARDIA THAT OCCURRED on two separate occasions so he has stopped using it. NOW USING  BENADRYL  .  ONLY S/E DRY MOUTH    3) Hypertension: home readings 825 to 003 systolic       Outpatient Medications Prior to Visit  Medication Sig Dispense Refill  . ACCU-CHEK AVIVA PLUS test strip CHECK BLOOD SUGAR 4 TIMES A DAY AS NEEDED DX E1.51 400 each 01  . amLODipine (NORVASC) 10 MG tablet TAKE 1 TABLET (10 MG TOTAL) BY MOUTH DAILY. 90 tablet 2  . aspirin 325 MG tablet Take 325 mg by mouth daily.      Marland Kitchen atorvastatin (LIPITOR) 20 MG tablet TAKE 1 TABLET (20 MG TOTAL) BY MOUTH DAILY. 90 tablet 1  . B-D ULTRAFINE III SHORT PEN 31G X 8 MM MISC USE AS DIRECTED 3 TIMES A DAY 300 each 5    . Calcium Carbonate-Vitamin D (CALCIUM + D) 600-200 MG-UNIT TABS Take 1 tablet by mouth 2 (two) times daily.      . clopidogrel (PLAVIX) 75 MG tablet TAKE 1 TABLET (75 MG TOTAL) BY MOUTH DAILY. 90 tablet 1  . cyanocobalamin (,VITAMIN B-12,) 1000 MCG/ML injection INJECT 1 ML (1,000 MCG TOTAL) INTO THE MUSCLE EVERY 30 (THIRTY) DAYS. 10 mL 0  . fenofibrate (TRICOR) 48 MG tablet TAKE 1 TABLET BY MOUTH DAILY 90 tablet 1  . furosemide (LASIX) 20 MG tablet Take 1 tablet (20 mg total) by mouth daily. AS NEEDED FOR FLUID RETENTION 90 tablet 2  . hydrocortisone 2.5 % cream Apply 1 application topically 2 (two) times daily as needed.    . insulin aspart protamine - aspart (NOVOLOG MIX 70/30 FLEXPEN) (70-30) 100 UNIT/ML FlexPen INJECT 40 units two times daily , adjust dose using sliding scale 75 pen 1  . latanoprost (XALATAN) 0.005 % ophthalmic solution Place 1 drop into both eyes at bedtime.  3  . NOVOLOG MIX 70/30 FLEXPEN (70-30) 100 UNIT/ML FlexPen INJECT 21 UNITS IN THE AM AND 41 UNITS IN THE PM PRE MEAL 60 pen 1  . valsartan (DIOVAN) 160 MG tablet TAKE 1 TABLET BY MOUTH DAILY 90 tablet 1  . Insulin Syringe-Needle U-100 (B-D INS SYRINGE 0.5CC/31GX5/16) 31G X 5/16" 0.5 ML MISC 1  application by Does not apply route 3 (three) times daily before meals. 300 each 3  . losartan (COZAAR) 100 MG tablet TAKE 1 TABLET BY MOUTH EVERY DAY 90 tablet 0  . metoprolol tartrate (LOPRESSOR) 50 MG tablet Take 1.5 tablets (75 mg total) by mouth 2 (two) times daily. 135 tablet 1  . Syringe, Disposable, 3 ML MISC For use with B12 injections weekly/monthly 25 each 0  . traZODone (DESYREL) 50 MG tablet Take 0.5-1 tablets (25-50 mg total) by mouth at bedtime as needed for sleep. 30 tablet 3   No facility-administered medications prior to visit.     Review of Systems;  Patient denies headache, fevers, malaise, unintentional weight loss, skin rash, eye pain, sinus congestion and sinus pain, sore throat, dysphagia,  hemoptysis ,  cough, dyspnea, wheezing, chest pain, palpitations, orthopnea, edema, abdominal pain, nausea, melena, diarrhea, constipation, flank pain, dysuria, hematuria, urinary  Frequency, nocturia, numbness, tingling, seizures,  Focal weakness, Loss of consciousness,  Tremor, insomnia, depression, anxiety, and suicidal ideation.      Objective:  BP (!) 150/78 (BP Location: Left Arm, Patient Position: Sitting, Cuff Size: Normal)   Pulse 84   Temp 97.8 F (36.6 C) (Oral)   Resp 15   Ht 5\' 7"  (1.702 m)   Wt 187 lb 9.6 oz (85.1 kg)   SpO2 97%   BMI 29.38 kg/m   BP Readings from Last 3 Encounters:  01/08/17 (!) 150/78  10/09/16 (!) 144/78  08/13/16 (!) 156/72    Wt Readings from Last 3 Encounters:  01/08/17 187 lb 9.6 oz (85.1 kg)  10/09/16 185 lb 9.6 oz (84.2 kg)  08/13/16 190 lb 6.4 oz (86.4 kg)    General appearance: alert, cooperative and appears stated age Ears: normal TM's and external ear canals both ears Throat: lips, mucosa, and tongue normal; teeth and gums normal Neck: no adenopathy, no carotid bruit, supple, symmetrical, trachea midline and thyroid not enlarged, symmetric, no tenderness/mass/nodules Back: symmetric, no curvature. ROM normal. No CVA tenderness. Lungs: clear to auscultation bilaterally Heart: regular rate and rhythm, S1, S2 normal, no murmur, click, rub or gallop Abdomen: soft, non-tender; bowel sounds normal; no masses,  no organomegaly Pulses: 2+ and symmetric Skin: Skin color, texture, turgor normal. No rashes or lesions Lymph nodes: Cervical, supraclavicular, and axillary nodes normal.  Lab Results  Component Value Date   HGBA1C 7.1 (H) 10/09/2016   HGBA1C 7.2 07/08/2016   HGBA1C 7.2 (H) 12/25/2015    Lab Results  Component Value Date   CREATININE 1.77 (H) 10/09/2016   CREATININE 1.68 (H) 07/08/2016   CREATININE 1.87 (H) 12/25/2015    Lab Results  Component Value Date   WBC 6.4 01/09/2011   HGB 13.0 01/09/2011   HCT 40.0 01/09/2011   PLT 215  01/09/2011   GLUCOSE 81 10/09/2016   CHOL 123 07/08/2016   TRIG 149.0 07/08/2016   HDL 35.80 (L) 07/08/2016   LDLDIRECT 49.0 06/26/2015   LDLCALC 57 07/08/2016   ALT 16 10/09/2016   AST 20 10/09/2016   NA 139 10/09/2016   K 4.7 10/09/2016   CL 104 10/09/2016   CREATININE 1.77 (H) 10/09/2016   BUN 24 (H) 10/09/2016   CO2 25 10/09/2016   TSH 1.49 06/13/2014   HGBA1C 7.1 (H) 10/09/2016   MICROALBUR 21.4 (H) 07/08/2016    Dg Chest 2 View  Result Date: 01/09/2011 *RADIOLOGY REPORT* Clinical Data: Preop back surgery. CHEST - 2 VIEW Comparison: None. Findings: Post CABG.  Sequential pacemaker enters from the  left with the tips at the level of the right atrium and right ventricle. Cardiomegaly. Central pulmonary vascular prominence. No infiltrate, congestive heart failure or pneumothorax. Calcified aorta.  Mild thoracic spine degenerative changes. IMPRESSION: Cardiomegaly with pacemaker in place. No infiltrate, congestive heart failure or pneumothorax. Original Report Authenticated By: Doug Sou, M.D.   Assessment & Plan:   Problem List Items Addressed This Visit    CKD (chronic kidney disease) stage 3, GFR 30-59 ml/min ([place]) - Primary    Reminded to avoid NSAIDs for management of arthritis . may try turmeric ,  Tylenol and tramadol        Relevant Orders   Comprehensive metabolic panel   Diabetes mellitus with chronic kidney disease (HCC)    Insulin  Increased to 21units in the am and decreased to  41 units in the evening .  A1c improved . Improvement in frequency of hypoglycemic events resulting from elimination of  lunchtime insulin.  DM still very well controlled for age and risks of adverse events lowered with slight loosening of control.   Lab Results  Component Value Date   HGBA1C 7.1 (H) 10/09/2016           Diabetes mellitus with peripheral vascular disease (HCC)   Relevant Medications   metoprolol tartrate (LOPRESSOR) 100 MG tablet   Other Relevant Orders    Hemoglobin A1c   Microalbumin / creatinine urine ratio   Hyperlipidemia with target LDL less than 70   Relevant Medications   metoprolol tartrate (LOPRESSOR) 100 MG tablet   Other Relevant Orders   Lipid panel   Hypertension    Not Well controlled on current regimen. Increase metoprolol to 100 mg twice daily       Relevant Medications   metoprolol tartrate (LOPRESSOR) 100 MG tablet      I have discontinued [name]. Patman's traZODone and losartan. I have also changed his metoprolol tartrate. Additionally, I am having him start on Zoster Vaccine Adjuvanted. Lastly, I am having him maintain his aspirin, Calcium Carbonate-Vitamin D, B-D ULTRAFINE III SHORT PEN, hydrocortisone, latanoprost, furosemide, insulin aspart protamine - aspart, fenofibrate, clopidogrel, valsartan, atorvastatin, ACCU-CHEK AVIVA PLUS, cyanocobalamin, amLODipine, and NOVOLOG MIX 70/30 FLEXPEN.  Meds ordered this encounter  Medications  . metoprolol tartrate (LOPRESSOR) 100 MG tablet    Sig: Take 1 tablet (100 mg total) by mouth 2 (two) times daily.    Dispense:  180 tablet    Refill:  1  . Zoster Vaccine Adjuvanted [facility]) injection    Sig: Inject 0.5 mLs into the muscle once for 1 dose.    Dispense:  1 each    Refill:  1    Medications Discontinued During This Encounter  Medication Reason  . traZODone (DESYREL) 50 MG tablet   . losartan (COZAAR) 100 MG tablet   . metoprolol tartrate (LOPRESSOR) 50 MG tablet     Follow-up: Return in about 3 months (around 04/08/2017) for follow up diabetes.   Crecencio Mc, MD

## 2017-01-08 NOTE — Patient Instructions (Addendum)
  INCREASE YOUR METOPROLOL TO 100 MG TWICE DAILY .Marland Kitchen GOAL BP IS < 130/80 UT NOT BELOW 110/70     The ShingRx vaccine is now available in local pharmacies and is much more protective thant Zostavaxs,  It is therefore ADVISED for all interested adults over 50 to prevent shingles   If you crave Pumpkin Pie,  Try the [employer] Pumpkin Pie "limited edition " flavored"  yogurt.  With whipped cream  8 carbs ,  very indulgent tasting AND ONLY 8 CARBS

## 2017-01-10 NOTE — Assessment & Plan Note (Signed)
Reminded to avoid NSAIDs for management of arthritis . may try turmeric ,  Tylenol and tramadol

## 2017-01-10 NOTE — Assessment & Plan Note (Addendum)
Not Well controlled on current regimen. Increase metoprolol to 100 mg twice daily

## 2017-01-10 NOTE — Assessment & Plan Note (Signed)
Insulin  Increased to 21units in the am and decreased to  41 units in the evening .  A1c improved . Improvement in frequency of hypoglycemic events resulting from elimination of  lunchtime insulin.  DM still very well controlled for age and risks of adverse events lowered with slight loosening of control.   Lab Results  Component Value Date   HGBA1C 7.1 (H) 10/09/2016

## 2017-01-11 ENCOUNTER — Other Ambulatory Visit: Payer: Self-pay | Admitting: Internal Medicine

## 2017-01-12 ENCOUNTER — Other Ambulatory Visit: Payer: Self-pay | Admitting: Internal Medicine

## 2017-01-14 ENCOUNTER — Other Ambulatory Visit: Payer: Self-pay | Admitting: Internal Medicine

## 2017-01-16 ENCOUNTER — Other Ambulatory Visit: Payer: Self-pay | Admitting: Internal Medicine

## 2017-01-16 ENCOUNTER — Telehealth: Payer: Self-pay | Admitting: Internal Medicine

## 2017-01-16 MED ORDER — INSULIN ASPART PROT & ASPART (70-30 MIX) 100 UNIT/ML PEN: PEN_INJECTOR | SUBCUTANEOUS | 1 refills | Status: DC

## 2017-01-16 NOTE — Telephone Encounter (Signed)
Copied from [place]. Topic: Quick Communication - See Telephone Encounter >> Jan 16, 2017  9:47 AM Ivar Drape wrote: CRM for notification. See Telephone encounter for:  01/16/17. Patient needs a refill of his   insulin aspart protamine - aspart (NOVOLOG MIX 70/30 FLEXPEN) (70-30) 100 UNIT/ML FlexPen medication.  He said the short 37mm Needles for [facility] on [address].

## 2017-01-16 NOTE — Telephone Encounter (Signed)
Last office visit 01/08/17 with Dr. Derrel Nip.  HGB A1C noted on 01/08/17. Refill of Novolog per protocol.

## 2017-01-18 ENCOUNTER — Other Ambulatory Visit: Payer: Self-pay | Admitting: Internal Medicine

## 2017-01-21 ENCOUNTER — Other Ambulatory Visit: Payer: Self-pay

## 2017-01-21 ENCOUNTER — Telehealth: Payer: Self-pay | Admitting: Internal Medicine

## 2017-01-21 MED ORDER — INSULIN PEN NEEDLE 31G X 8 MM MISC: 5 refills | Status: AC

## 2017-01-21 NOTE — Telephone Encounter (Signed)
Pt received a letter stating that his Valsartan and Amlodipine have been recalled and is wanting a new Rx for these in written form, please advise

## 2017-01-21 NOTE — Telephone Encounter (Signed)
Copied from [place]. Topic: Quick Communication - Rx Refill/Question >> Jan 21, 2017 10:29 AM Patrice Paradise wrote: Has the patient contacted their pharmacy? Yes.   Insulin Syringe-Needle U-100 (B-D INS SYRINGE 0.5CC/31GX5/16) 31G X 5/16" 0.5 ML MISC (PATIENT IS OUT OF HIS NEEDLE)  (Agent: If no, request that the patient contact the pharmacy for the refill.)  Preferred Pharmacy (with [id number] - [place], [place] [address] Phone: [phone] Fax: [phone] phone number or street name):    Agent: Please be advised that RX refills may take up to 3 business days. We ask that you follow-up with your pharmacy.

## 2017-01-22 DIAGNOSIS — X32XXXA Exposure to sunlight, initial encounter: Secondary | ICD-10-CM | POA: Diagnosis not present

## 2017-01-22 DIAGNOSIS — D225 Melanocytic nevi of trunk: Secondary | ICD-10-CM | POA: Diagnosis not present

## 2017-01-22 DIAGNOSIS — D2271 Melanocytic nevi of right lower limb, including hip: Secondary | ICD-10-CM | POA: Diagnosis not present

## 2017-01-22 DIAGNOSIS — Z85828 Personal history of other malignant neoplasm of skin: Secondary | ICD-10-CM | POA: Diagnosis not present

## 2017-01-22 DIAGNOSIS — L570 Actinic keratosis: Secondary | ICD-10-CM | POA: Diagnosis not present

## 2017-01-22 DIAGNOSIS — L280 Lichen simplex chronicus: Secondary | ICD-10-CM | POA: Diagnosis not present

## 2017-01-24 NOTE — Telephone Encounter (Signed)
Spoke with pt and he stated that he contacted his pharmacy and they took care of the medication that has been recalled. He stated that his amlodipine was not involved but his valsartan was so the pharmacy took back what he had and gave him the same medication just a different manufacturer. Pt also stated that the rx for the insulin pen needles has been sent in.

## 2017-03-04 DIAGNOSIS — H5203 Hypermetropia, bilateral: Secondary | ICD-10-CM | POA: Diagnosis not present

## 2017-03-04 DIAGNOSIS — H524 Presbyopia: Secondary | ICD-10-CM | POA: Diagnosis not present

## 2017-03-04 DIAGNOSIS — Z961 Presence of intraocular lens: Secondary | ICD-10-CM | POA: Diagnosis not present

## 2017-03-04 DIAGNOSIS — H401131 Primary open-angle glaucoma, bilateral, mild stage: Secondary | ICD-10-CM | POA: Diagnosis not present

## 2017-03-04 DIAGNOSIS — H52223 Regular astigmatism, bilateral: Secondary | ICD-10-CM | POA: Diagnosis not present

## 2017-03-14 ENCOUNTER — Other Ambulatory Visit: Payer: Self-pay

## 2017-03-14 MED ORDER — GLUCOSE BLOOD VI STRP: ORAL_STRIP | 5 refills | Status: DC

## 2017-03-17 ENCOUNTER — Encounter: Payer: Self-pay | Admitting: Internal Medicine

## 2017-03-17 ENCOUNTER — Ambulatory Visit (INDEPENDENT_AMBULATORY_CARE_PROVIDER_SITE_OTHER): Payer: Medicare Other

## 2017-03-17 VITALS — BP 138/76 | HR 63 | Temp 97.9°F | Resp 15 | Ht 67.0 in | Wt 187.8 lb

## 2017-03-17 DIAGNOSIS — Z0000 Encounter for general adult medical examination without abnormal findings: Secondary | ICD-10-CM | POA: Diagnosis not present

## 2017-03-17 NOTE — Patient Instructions (Addendum)
  [name] , Thank you for taking time to come for your Medicare Wellness Visit. I appreciate your ongoing commitment to your health goals. Please review the following plan we discussed and let me know if I can assist you in the future.   Follow up with Dr. Derrel Nip as needed.    Have a great day!  These are the goals we discussed: Goals    . Increase physical activity     Swim for exercise       This is a list of the screening recommended for you and due dates:  Health Maintenance  Topic Date Due  . Hemoglobin A1C  04/08/2017  . Eye exam for diabetics  05/16/2017  . Complete foot exam   07/08/2017  . Tetanus Vaccine  09/03/2022  . Flu Shot  Completed  . Pneumonia vaccines  Completed

## 2017-03-17 NOTE — Progress Notes (Signed)
Subjective:   [name] is a 84 y.o. male who presents for Medicare Annual/Subsequent preventive examination.  Review of Systems:  No ROS.  Medicare Wellness Visit. Additional risk factors are reflected in the social history.  Cardiac Risk Factors include: advanced age (>36men, >73 women);diabetes mellitus;male gender;hypertension     Objective:    Vitals: BP 138/76 (BP Location: Left Arm, Patient Position: Sitting, Cuff Size: Normal)   Pulse 63   Temp 97.9 F (36.6 C) (Oral)   Resp 15   Ht 5\' 7"  (1.702 m)   Wt 187 lb 12.8 oz (85.2 kg)   SpO2 98%   BMI 29.41 kg/m   Body mass index is 29.41 kg/m.  Advanced Directives 03/17/2017 08/13/2016 03/14/2016 01/09/2011 12/18/2010  Does Patient Have a Medical Advance Directive? Yes Yes Yes Patient does not have advance directive Patient has advance directive, copy not in chart  Type of Advance Directive H. Rivera Colon;Living will [place];Living will Living will;Healthcare Power of [place] will  Does patient want to make changes to medical advance directive? No - Patient declined - No - Patient declined - -  Copy of [place] in Chart? Yes - No - copy requested - -  Pre-existing out of facility DNR order (yellow form or pink MOST form) - - - No -    Tobacco Social History   Tobacco Use  Smoking Status Former Smoker  . Last attempt to quit: 09/27/1985  . Years since quitting: 31.4  Smokeless Tobacco Never Used     Counseling given: Not Answered   Clinical Intake:  Pre-visit preparation completed: Yes  Pain : No/denies pain     Nutritional Status: BMI 25 -29 Overweight Diabetes: Yes(Followed by PCP)  How often do you need to have someone help you when you read instructions, pamphlets, or other written materials from your doctor or pharmacy?: 1 - Never  Interpreter Needed?: No     Past Medical History:  Diagnosis Date  . Blood transfusion   .  Coronary artery disease    h/o bypass coronary- 2001 & peripheral- 2011 , last full cardiac visit  in [place] , [place], [employer]  . Diabetes mellitus   . H/O: malaria 52   in [place]  . Hyperlipidemia   . Hypertension   . Peripheral artery disease ([place])    folowed by [place] bypass 2011 [place]  . Renal insufficiency    by Nov 2012 labs, no old records available   Past Surgical History:  Procedure Laterality Date  . CORONARY ARTERY BYPASS GRAFT  2001   4 vessel, done in [place]   . FEMORAL BYPASS  2011   done in [place],  now followed by [facility]  . INSERT / REPLACE / REMOVE PACEMAKER     2004  . LUMBAR LAMINECTOMY/DECOMPRESSION MICRODISCECTOMY  01/16/2011   Procedure: LUMBAR LAMINECTOMY/DECOMPRESSION MICRODISCECTOMY;  Surgeon: Ophelia Charter;  Location: [facility];  Service: Neurosurgery;  Laterality: N/A;  Lumbar four and lumbar five laminectomies   . PACEMAKER INSERTION    . SPINE SURGERY     Family History  Problem Relation Age of Onset  . Mental illness Mother 24       alzheimers  . Peripheral vascular disease Father   . Diabetes Father   . Anesthesia problems Neg Hx   . Hypotension Neg Hx   . Malignant hyperthermia Neg Hx   . Pseudochol deficiency Neg Hx  Social History   Socioeconomic History  . Marital status: Widowed    Spouse name: Silva Bandy   . Number of children: 3  . Years of education: 48  . Highest education level: None  Social Needs  . Financial resource strain: None  . Food insecurity - worry: None  . Food insecurity - inability: None  . Transportation needs - medical: None  . Transportation needs - non-medical: None  Occupational History  . Occupation: retired  Tobacco Use  . Smoking status: Former Smoker    Last attempt to quit: 09/27/1985    Years since quitting: 31.4  . Smokeless tobacco: Never Used  Substance and Sexual Activity  . Alcohol use: No  . Drug use: No  . Sexual activity: No  Other Topics Concern  . None    Social History Narrative   Widowed, May 2016     Outpatient Encounter Medications as of 03/17/2017  Medication Sig  . amLODipine (NORVASC) 10 MG tablet TAKE 1 TABLET (10 MG TOTAL) BY MOUTH DAILY.  Marland Kitchen aspirin 325 MG tablet Take 325 mg by mouth daily.    Marland Kitchen atorvastatin (LIPITOR) 20 MG tablet TAKE 1 TABLET (20 MG TOTAL) BY MOUTH DAILY.  . Calcium Carbonate-Vitamin D (CALCIUM + D) 600-200 MG-UNIT TABS Take 1 tablet by mouth 2 (two) times daily.    . clopidogrel (PLAVIX) 75 MG tablet TAKE 1 TABLET (75 MG TOTAL) BY MOUTH DAILY.  . cyanocobalamin (,VITAMIN B-12,) 1000 MCG/ML injection INJECT 1 ML (1,000 MCG TOTAL) INTO THE MUSCLE EVERY 30 (THIRTY) DAYS.  . fenofibrate (TRICOR) 48 MG tablet TAKE 1 TABLET BY MOUTH DAILY  . furosemide (LASIX) 20 MG tablet Take 1 tablet (20 mg total) by mouth daily. AS NEEDED FOR FLUID RETENTION  . glucose blood (ACCU-CHEK AVIVA PLUS) test strip CHECK BLOOD SUGAR 4 TIMES A DAY AS NEEDED DX E1.51  . hydrocortisone 2.5 % cream Apply 1 application topically 2 (two) times daily as needed.  . insulin aspart protamine - aspart (NOVOLOG MIX 70/30 FLEXPEN) (70-30) 100 UNIT/ML FlexPen INJECT 40 units two times daily , adjust dose using sliding scale  . Insulin Pen Needle (B-D ULTRAFINE III SHORT PEN) 31G X 8 MM MISC USE AS DIRECTED 3 TIMES A DAY  . Insulin Syringe-Needle U-100 (B-D INS SYRINGE 0.5CC/31GX5/16) 31G X 5/16" 0.5 ML MISC 1 application by Does not apply route 3 (three) times daily before meals.  . latanoprost (XALATAN) 0.005 % ophthalmic solution Place 1 drop into both eyes at bedtime.  . metoprolol tartrate (LOPRESSOR) 100 MG tablet Take 1 tablet (100 mg total) by mouth 2 (two) times daily.  . Syringe, Disposable, 3 ML MISC For use with B12 injections weekly/monthly  . SYRINGE-NEEDLE, DISP, 3 ML (B-D 3CC LUER-LOK SYR 25GX1") 25G X 1" 3 ML MISC FOR USE WITH B12 INJECTIONS WEEKLY/MONTHLY  . valsartan (DIOVAN) 160 MG tablet TAKE 1 TABLET BY MOUTH DAILY  . [DISCONTINUED]  insulin aspart protamine - aspart (NOVOLOG MIX 70/30 FLEXPEN) (70-30) 100 UNIT/ML FlexPen INJECT 21 UNITS IN THE AM AND 41 UNITS IN THE PM PRE MEAL  . [DISCONTINUED] metoprolol tartrate (LOPRESSOR) 50 MG tablet TAKE 1 TABLET BY MOUTH TWICE A DAY   No facility-administered encounter medications on file as of 03/17/2017.     Activities of Daily Living In your present state of health, do you have any difficulty performing the following activities: 03/17/2017  Hearing? Y  Comment Hearing aid, bilateral  Vision? N  Difficulty concentrating or making decisions? N  Walking or climbing stairs? Y  Comment Fatigued legs when walking up stairs  Dressing or bathing? N  Doing errands, shopping? N  Preparing Food and eating ? N  Using the Toilet? N  In the past six months, have you accidently leaked urine? N  Do you have problems with loss of bowel control? N  Managing your Medications? N  Managing your Finances? N  Housekeeping or managing your Housekeeping? N  Some recent data might be hidden    Patient Care Team: Crecencio Mc, MD as PCP - General (Internal Medicine)   Assessment:   This is a routine wellness examination for Daimien.  The goal of the wellness visit is to assist the patient how to close the gaps in care and create a preventative care plan for the patient.   The roster of all physicians providing medical care to patient is listed in the Snapshot section of the chart.  Taking calcium VIT D as appropriate/Osteoporosis risk reviewed.    Safety issues reviewed; Smoke and carbon monoxide detectors in the home. No firearms or firearms locked in a safe within the home. Wears seatbelts when driving or riding with others. No violence in the home.  They do not have excessive sun exposure.  Discussed the need for sun protection: hats, long sleeves and the use of sunscreen if there is significant sun exposure.  Patient is alert, normal appearance, oriented to person/place/and time.    Correctly identified the president of the [place] and recalls of 3/3 words. Performs simple calculations and can read correct time from watch face.  Displays appropriate judgement.  No new identified risk were noted.  No failures at ADL's or IADL's.   BMI- discussed the importance of a healthy diet, water intake and the benefits of aerobic exercise. Educational material provided.   24 hour diet recall: Regular diet  Daily fluid intake: 0 cups of caffeine, 4-6 cups of water 2-3, diet coke  Dental- every 6 months.  Eye- Visual acuity not assessed per patient preference since they have regular follow up with the ophthalmologist.  Wears corrective lenses.  Sleep patterns- Sleeps 5 hours at night.  Wakes feeling rested.  Patient Concerns: None at this time. Follow up with PCP as needed.  Exercise Activities and Dietary recommendations Current Exercise Habits: Home exercise routine, Time (Minutes): 30, Frequency (Times/Week): 5, Weekly Exercise (Minutes/Week): 150, Intensity: Mild  Goals    . Increase physical activity     Swim for exercise       Fall Risk Fall Risk  03/17/2017 03/14/2016 12/25/2015 10/02/2015 06/14/2014  Falls in the past year? No No No No No  Comment - - - Emmi Telephone Survey: data to providers prior to load -   Depression Screen PHQ 2/9 Scores 03/17/2017 03/14/2016 12/25/2015 06/14/2014  PHQ - 2 Score 0 0 0 2  PHQ- 9 Score - - - 6    Cognitive Function     6CIT Screen 03/17/2017 03/14/2016  What Year? 0 points 0 points  What month? 0 points 0 points  What time? 0 points 0 points  Count back from 20 0 points 0 points  Months in reverse 0 points 0 points  Repeat phrase 0 points 0 points  Total Score 0 0    Immunization History  Administered Date(s) Administered  . Influenza Split 11/06/2010, 10/21/2011  . Influenza, High Dose Seasonal PF 12/25/2015, 10/09/2016  . Influenza,inj,Quad PF,6+ Mos 11/03/2012, 10/30/2013, 12/12/2014  . Pneumococcal Conjugate-13  05/04/2013  . Pneumococcal  Polysaccharide-23 12/03/2009, 12/12/2014  . Td 07/28/2005  . Tdap 09/02/2012  . Zoster 03/08/2013    Screening Tests Health Maintenance  Topic Date Due  . HEMOGLOBIN A1C  04/08/2017  . OPHTHALMOLOGY EXAM  05/16/2017  . FOOT EXAM  07/08/2017  . TETANUS/TDAP  09/03/2022  . INFLUENZA VACCINE  Completed  . PNA vac Low Risk Adult  Completed     Plan:    End of life planning; Advance aging; Advanced directives discussed. Copy of current HCPOA/Living Will on file.    I have personally reviewed and noted the following in the patient's chart:   . Medical and social history . Use of alcohol, tobacco or illicit drugs  . Current medications and supplements . Functional ability and status . Nutritional status . Physical activity . Advanced directives . List of other physicians . Hospitalizations, surgeries, and ER visits in previous 12 months . Vitals . Screenings to include cognitive, depression, and falls . Referrals and appointments  In addition, I have reviewed and discussed with patient certain preventive protocols, quality metrics, and best practice recommendations. A written personalized care plan for preventive services as well as general preventive health recommendations were provided to patient.     OBrien-Blaney, Olusegun Gerstenberger L, LPN  1/61/0960   I have reviewed the above information and agree with above.   Deborra Medina, MD

## 2017-03-20 MED ORDER — INSULIN ASPART PROT & ASPART (70-30 MIX) 100 UNIT/ML PEN: PEN_INJECTOR | SUBCUTANEOUS | 1 refills | Status: DC

## 2017-04-02 ENCOUNTER — Other Ambulatory Visit: Payer: Self-pay | Admitting: Internal Medicine

## 2017-04-06 ENCOUNTER — Other Ambulatory Visit: Payer: Self-pay | Admitting: Internal Medicine

## 2017-04-07 ENCOUNTER — Other Ambulatory Visit (INDEPENDENT_AMBULATORY_CARE_PROVIDER_SITE_OTHER): Payer: Medicare Other

## 2017-04-07 DIAGNOSIS — E785 Hyperlipidemia, unspecified: Secondary | ICD-10-CM | POA: Diagnosis not present

## 2017-04-07 DIAGNOSIS — N183 Chronic kidney disease, stage 3 unspecified: Secondary | ICD-10-CM

## 2017-04-07 DIAGNOSIS — E1151 Type 2 diabetes mellitus with diabetic peripheral angiopathy without gangrene: Secondary | ICD-10-CM | POA: Diagnosis not present

## 2017-04-07 LAB — LIPID PANEL
CHOLESTEROL: 102 mg/dL (ref 0–200)
HDL: 34 mg/dL — ABNORMAL LOW (ref >39.00)
LDL CALC: 44 mg/dL (ref 0–99)
NONHDL: 67.62
Total CHOL/HDL Ratio: 3
Triglycerides: 119 mg/dL (ref 0.0–149.0)
VLDL: 23.8 mg/dL (ref 0.0–40.0)

## 2017-04-07 LAB — COMPREHENSIVE METABOLIC PANEL
ALT: 14 U/L (ref 0–53)
AST: 14 U/L (ref 0–37)
Albumin: 3.8 g/dL (ref 3.5–5.2)
Alkaline Phosphatase: 48 U/L (ref 39–117)
BUN: 47 mg/dL — ABNORMAL HIGH (ref 6–23)
CALCIUM: 10.3 mg/dL (ref 8.4–10.5)
CHLORIDE: 106 meq/L (ref 96–112)
CO2: 30 meq/L (ref 19–32)
CREATININE: 1.89 mg/dL — AB (ref 0.40–1.50)
GFR: 35.74 mL/min — ABNORMAL LOW (ref >60.00)
Glucose, Bld: 166 mg/dL — ABNORMAL HIGH (ref 70–99)
POTASSIUM: 5 meq/L (ref 3.5–5.1)
Sodium: 143 mEq/L (ref 135–145)
Total Bilirubin: 0.6 mg/dL (ref 0.2–1.2)
Total Protein: 7.1 g/dL (ref 6.0–8.3)

## 2017-04-07 LAB — MICROALBUMIN / CREATININE URINE RATIO
Creatinine,U: 55.7 mg/dL
MICROALB UR: 3.1 mg/dL — AB (ref 0.0–1.9)
Microalb Creat Ratio: 5.5 mg/g (ref 0.0–30.0)

## 2017-04-07 LAB — HEMOGLOBIN A1C: Hgb A1c MFr Bld: 7.5 % — ABNORMAL HIGH (ref 4.6–6.5)

## 2017-04-08 DIAGNOSIS — I10 Essential (primary) hypertension: Secondary | ICD-10-CM | POA: Diagnosis not present

## 2017-04-08 DIAGNOSIS — I2510 Atherosclerotic heart disease of native coronary artery without angina pectoris: Secondary | ICD-10-CM | POA: Diagnosis not present

## 2017-04-08 DIAGNOSIS — E7849 Other hyperlipidemia: Secondary | ICD-10-CM | POA: Diagnosis not present

## 2017-04-08 DIAGNOSIS — I739 Peripheral vascular disease, unspecified: Secondary | ICD-10-CM | POA: Diagnosis not present

## 2017-04-08 DIAGNOSIS — I350 Nonrheumatic aortic (valve) stenosis: Secondary | ICD-10-CM | POA: Diagnosis not present

## 2017-04-08 DIAGNOSIS — R011 Cardiac murmur, unspecified: Secondary | ICD-10-CM | POA: Diagnosis not present

## 2017-04-08 DIAGNOSIS — E119 Type 2 diabetes mellitus without complications: Secondary | ICD-10-CM | POA: Diagnosis not present

## 2017-04-08 DIAGNOSIS — I495 Sick sinus syndrome: Secondary | ICD-10-CM | POA: Diagnosis not present

## 2017-04-08 DIAGNOSIS — Z951 Presence of aortocoronary bypass graft: Secondary | ICD-10-CM | POA: Diagnosis not present

## 2017-04-09 ENCOUNTER — Encounter: Payer: Self-pay | Admitting: Internal Medicine

## 2017-04-09 ENCOUNTER — Ambulatory Visit (INDEPENDENT_AMBULATORY_CARE_PROVIDER_SITE_OTHER): Payer: Medicare Other | Admitting: Internal Medicine

## 2017-04-09 VITALS — BP 132/78 | HR 78 | Wt 185.5 lb

## 2017-04-09 DIAGNOSIS — E1151 Type 2 diabetes mellitus with diabetic peripheral angiopathy without gangrene: Secondary | ICD-10-CM | POA: Diagnosis not present

## 2017-04-09 DIAGNOSIS — E1340 Other specified diabetes mellitus with diabetic neuropathy, unspecified: Secondary | ICD-10-CM

## 2017-04-09 DIAGNOSIS — E1122 Type 2 diabetes mellitus with diabetic chronic kidney disease: Secondary | ICD-10-CM

## 2017-04-09 DIAGNOSIS — I350 Nonrheumatic aortic (valve) stenosis: Secondary | ICD-10-CM

## 2017-04-09 DIAGNOSIS — F5101 Primary insomnia: Secondary | ICD-10-CM

## 2017-04-09 DIAGNOSIS — N183 Chronic kidney disease, stage 3 unspecified: Secondary | ICD-10-CM

## 2017-04-09 DIAGNOSIS — E785 Hyperlipidemia, unspecified: Secondary | ICD-10-CM

## 2017-04-09 DIAGNOSIS — Z794 Long term (current) use of insulin: Secondary | ICD-10-CM

## 2017-04-09 MED ORDER — GABAPENTIN 100 MG PO CAPS
100.0000 mg | ORAL_CAPSULE | Freq: Three times a day (TID) | ORAL | 3 refills | Status: DC
Start: 2017-04-09 — End: 2017-07-25

## 2017-04-09 NOTE — Patient Instructions (Addendum)
Increase your evening dose of insulin from 40 to 42 units before dinner, because Your A1c has increased to 7.5   Increase your water intake to 48 ounces daily   Ok to add fresh lemon juice but limit diet sodas and caffeinated beverages as well l as seltzer to once glass of each  Daily   Melatonin is a harmless sleep aide that can be taken (5 mg dose )  around 8 pm.   You can still use the  benadryl if you are still wide awake at 2 am     For your neuropathy  Trial of gabapentin  100 mg  Start with one tablet daily in the afternoon ,  After 3 days add a 2nd dose in the evening ,  And after 3 more days add a third dose in the morning

## 2017-04-09 NOTE — Progress Notes (Addendum)
Subjective:  Patient ID: [name], male    DOB: 04-04-1924  Age: 84 y.o. 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 Diagnoses of Hyperlipidemia with target LDL less than 70, Neuropathy due to secondary diabetes ([place]), Primary insomnia, and Nonrheumatic aortic valve stenosis were also pertinent to this visit.  HPI [name] presents for 3 month follow up on diabetes.  Patient has no complaints today.  Patient is following a low glycemic index diet and taking all prescribed medications regularly without side effects.  Using mixed insulin bid ac  40 units pre meal adjusted   Noon sugars have been normal   Nighttime sugars are elevated to over 200 occasionally,  Most are  160 to 170.   Patient was exercising about 3 times per week and intentionally trying to lose weight .  Patient has had an eye exam in the last 12 months and checks feet regularly for signs of infection.  Patient does not walk barefoot outside,  And reports worsening of the sensation of   burning in feet. Occurring both day and night,  No prior tiral of medication . Patient is up to date on all recommended vaccinations  Using benadryl for chronic insomnia   Aortic stenosis and mild LVEF 45% by 2017 echo not repeated as he is asymptomatic  last cardioolgy visit yesterday    Outpatient Medications Prior to Visit  Medication Sig Dispense Refill  . amLODipine (NORVASC) 10 MG tablet TAKE 1 TABLET (10 MG TOTAL) BY MOUTH DAILY. 90 tablet 2  . aspirin 325 MG tablet Take 325 mg by mouth daily.      Marland Kitchen atorvastatin (LIPITOR) 20 MG tablet TAKE 1 TABLET (20 MG TOTAL) BY MOUTH DAILY. 90 tablet 1  . Calcium Carbonate-Vitamin D (CALCIUM + D) 600-200 MG-UNIT TABS Take 1 tablet by mouth 2 (two) times daily.      . clopidogrel (PLAVIX) 75 MG tablet TAKE 1 TABLET (75 MG TOTAL) BY MOUTH DAILY. 90  tablet 1  . cyanocobalamin (,VITAMIN B-12,) 1000 MCG/ML injection INJECT 1 ML (1,000 MCG TOTAL) INTO THE MUSCLE EVERY 30 (THIRTY) DAYS. 10 mL 0  . fenofibrate (TRICOR) 48 MG tablet TAKE 1 TABLET BY MOUTH DAILY 90 tablet 1  . furosemide (LASIX) 20 MG tablet TAKE 1 TABLET (20 MG TOTAL) BY MOUTH DAILY. AS NEEDED FOR FLUID RETENTION 90 tablet 1  . glucose blood (ACCU-CHEK AVIVA PLUS) test strip CHECK BLOOD SUGAR 4 TIMES A DAY AS NEEDED DX E1.51 400 each 5  . hydrocortisone 2.5 % cream Apply 1 application topically 2 (two) times daily as needed.    . insulin aspart protamine - aspart (NOVOLOG MIX 70/30 FLEXPEN) (70-30) 100 UNIT/ML FlexPen INJECT 40 units two times daily , adjust dose using sliding scale 75 pen 1  . Insulin Pen Needle (B-D ULTRAFINE III SHORT PEN) 31G X 8 MM MISC USE AS DIRECTED 3 TIMES A DAY 300 each 5  . Insulin Syringe-Needle U-100 (B-D INS SYRINGE 0.5CC/31GX5/16) 31G X 5/16" 0.5 ML MISC 1 application by Does not apply route 3 (three) times daily before meals. 300 each 3  . latanoprost (XALATAN) 0.005 % ophthalmic solution Place 1 drop into both eyes at bedtime.  3  . metoprolol tartrate (LOPRESSOR) 100 MG tablet Take 1 tablet (100 mg total) by mouth 2 (two) times daily. 180 tablet 1  . Syringe, Disposable,  3 ML MISC For use with B12 injections weekly/monthly 25 each 0  . SYRINGE-NEEDLE, DISP, 3 ML (B-D 3CC LUER-LOK SYR 25GX1") 25G X 1" 3 ML MISC FOR USE WITH B12 INJECTIONS WEEKLY/MONTHLY 50 each 1  . valsartan (DIOVAN) 160 MG tablet TAKE 1 TABLET BY MOUTH DAILY 90 tablet 1   No facility-administered medications prior to visit.     Review of Systems;  Patient denies headache, fevers, malaise, unintentional weight loss, skin rash, eye pain, sinus congestion and sinus pain, sore throat, dysphagia,  hemoptysis , cough, dyspnea, wheezing, chest pain, palpitations, orthopnea, edema, abdominal pain, nausea, melena, diarrhea, constipation, flank pain, dysuria, hematuria, urinary   Frequency, nocturia, numbness, tingling, seizures,  Focal weakness, Loss of consciousness,  Tremor, insomnia, depression, anxiety, and suicidal ideation.      Objective:  BP 132/78 (BP Location: Left Arm, Patient Position: Sitting, Cuff Size: Normal)   Pulse 78   Wt 185 lb 8 oz (84.1 kg)   SpO2 97%   BMI 29.05 kg/m   BP Readings from Last 3 Encounters:  04/09/17 132/78  03/17/17 138/76  01/08/17 (!) 150/78    Wt Readings from Last 3 Encounters:  04/09/17 185 lb 8 oz (84.1 kg)  03/17/17 187 lb 12.8 oz (85.2 kg)  01/08/17 187 lb 9.6 oz (85.1 kg)    General appearance: alert, cooperative and appears stated age Ears: normal TM's and external ear canals both ears Throat: lips, mucosa, and tongue normal; teeth and gums normal Neck: no adenopathy, no carotid bruit, supple, symmetrical, trachea midline and thyroid not enlarged, symmetric, no tenderness/mass/nodules Back: symmetric, no curvature. ROM normal. No CVA tenderness. Lungs: clear to auscultation bilaterally Heart: regular rate and rhythm, S1, S2 normal, no murmur, click, rub or gallop Abdomen: soft, non-tender; bowel sounds normal; no masses,  no organomegaly Pulses: 2+ and symmetric Skin: Skin color, texture, turgor normal. No rashes or lesions Lymph nodes: Cervical, supraclavicular, and axillary nodes normal.  Lab Results  Component Value Date   HGBA1C 7.5 (H) 04/07/2017   HGBA1C 7.1 (H) 10/09/2016   HGBA1C 7.2 07/08/2016    Lab Results  Component Value Date   CREATININE 1.89 (H) 04/07/2017   CREATININE 1.77 (H) 10/09/2016   CREATININE 1.68 (H) 07/08/2016    Lab Results  Component Value Date   WBC 6.4 01/09/2011   HGB 13.0 01/09/2011   HCT 40.0 01/09/2011   PLT 215 01/09/2011   GLUCOSE 166 (H) 04/07/2017   CHOL 102 04/07/2017   TRIG 119.0 04/07/2017   HDL 34.00 (L) 04/07/2017   LDLDIRECT 49.0 06/26/2015   LDLCALC 44 04/07/2017   ALT 14 04/07/2017   AST 14 04/07/2017   NA 143 04/07/2017   K 5.0  04/07/2017   CL 106 04/07/2017   CREATININE 1.89 (H) 04/07/2017   BUN 47 (H) 04/07/2017   CO2 30 04/07/2017   TSH 1.49 06/13/2014   HGBA1C 7.5 (H) 04/07/2017   MICROALBUR 3.1 (H) 04/07/2017    Dg Chest 2 View  Result Date: 01/09/2011 *RADIOLOGY REPORT* Clinical Data: Preop back surgery. CHEST - 2 VIEW Comparison: None. Findings: Post CABG.  Sequential pacemaker enters from the left with the tips at the level of the right atrium and right ventricle. Cardiomegaly. Central pulmonary vascular prominence. No infiltrate, congestive heart failure or pneumothorax. Calcified aorta.  Mild thoracic spine degenerative changes. IMPRESSION: Cardiomegaly with pacemaker in place. No infiltrate, congestive heart failure or pneumothorax. Original Report Authenticated By: Doug Sou, M.D.   Assessment & Plan:  Problem List Items Addressed This Visit    Aortic stenosis    With mild reduction in EF by 2017 ECHO  Asymptomatic .        Diabetes mellitus with chronic kidney disease ([place]) - Primary    Slight loss of control  Using 40 units bid .  Advised to increase evening dose to 42 units. addig gabapentin for neuropathy, continue ARB, asa,  And statin   Lab Results  Component Value Date   HGBA1C 7.5 (H) 04/07/2017   Lab Results  Component Value Date   MICROALBUR 3.1 (H) 04/07/2017           Relevant Orders   Hemoglobin A1c   Comprehensive metabolic panel   Diabetic neuropathy associated with type 2 diabetes mellitus ([place])    Starting gabapentin for intrusive symptoms.  100 mg starting dose increase gradually to 300 mg daily       Hyperlipidemia with target LDL less than 70    LDL is at goal on 20 mg atorvastatin.  LFTS are normal,  No changes today   Lab Results  Component Value Date   CHOL 102 04/07/2017   HDL 34.00 (L) 04/07/2017   LDLCALC 44 04/07/2017   LDLDIRECT 49.0 06/26/2015   TRIG 119.0 04/07/2017   CHOLHDL 3 04/07/2017   Lab Results  Component Value Date   ALT 14  04/07/2017   AST 14 04/07/2017   ALKPHOS 48 04/07/2017   BILITOT 0.6 04/07/2017         Insomnia    Using benadryl for chronic  Symptoms;  advised to try melatonin.         A total of 25 minutes of face to face time was spent with patient more than half of which was spent in counselling on the above mentioned issues.    I am having [name] Aline "Burney Gauze" start on gabapentin. I am also having him maintain his aspirin, Calcium Carbonate-Vitamin D, hydrocortisone, latanoprost, cyanocobalamin, amLODipine, Syringe (Disposable), Insulin Syringe-Needle U-100, metoprolol tartrate, SYRINGE-NEEDLE (DISP) 3 ML, valsartan, atorvastatin, clopidogrel, fenofibrate, Insulin Pen Needle, glucose blood, insulin aspart protamine - aspart, and furosemide.  Meds ordered this encounter  Medications  . gabapentin (NEURONTIN) 100 MG capsule    Sig: Take 1 capsule (100 mg total) by mouth 3 (three) times daily.    Dispense:  90 capsule    Refill:  3    For neuropathy    There are no discontinued medications.  Follow-up: Return in about 3 months (around 07/10/2017) for follow up diabetes.   Crecencio Mc, MD

## 2017-04-12 DIAGNOSIS — I350 Nonrheumatic aortic (valve) stenosis: Secondary | ICD-10-CM | POA: Insufficient documentation

## 2017-04-12 NOTE — Assessment & Plan Note (Signed)
Slight loss of control  Using 40 units bid .  Advised to increase evening dose to 42 units. addig gabapentin for neuropathy, continue ARB, asa,  And statin   Lab Results  Component Value Date   HGBA1C 7.5 (H) 04/07/2017   Lab Results  Component Value Date   MICROALBUR 3.1 (H) 04/07/2017

## 2017-04-12 NOTE — Assessment & Plan Note (Signed)
With mild reduction in EF by 2017 ECHO  Asymptomatic .

## 2017-04-12 NOTE — Assessment & Plan Note (Signed)
Using benadryl for chronic  Symptoms;  advised to try melatonin.

## 2017-04-12 NOTE — Assessment & Plan Note (Signed)
Starting gabapentin for intrusive symptoms.  100 mg starting dose increase gradually to 300 mg daily

## 2017-04-12 NOTE — Assessment & Plan Note (Signed)
LDL is at goal on 20 mg atorvastatin.  LFTS are normal,  No changes today   Lab Results  Component Value Date   CHOL 102 04/07/2017   HDL 34.00 (L) 04/07/2017   LDLCALC 44 04/07/2017   LDLDIRECT 49.0 06/26/2015   TRIG 119.0 04/07/2017   CHOLHDL 3 04/07/2017   Lab Results  Component Value Date   ALT 14 04/07/2017   AST 14 04/07/2017   ALKPHOS 48 04/07/2017   BILITOT 0.6 04/07/2017

## 2017-04-17 ENCOUNTER — Other Ambulatory Visit: Payer: Self-pay | Admitting: Internal Medicine

## 2017-05-08 ENCOUNTER — Encounter: Payer: Self-pay | Admitting: Internal Medicine

## 2017-05-12 DIAGNOSIS — E1142 Type 2 diabetes mellitus with diabetic polyneuropathy: Secondary | ICD-10-CM | POA: Diagnosis not present

## 2017-05-12 DIAGNOSIS — E1159 Type 2 diabetes mellitus with other circulatory complications: Secondary | ICD-10-CM | POA: Diagnosis not present

## 2017-05-13 ENCOUNTER — Other Ambulatory Visit: Payer: Self-pay | Admitting: Internal Medicine

## 2017-05-13 NOTE — Telephone Encounter (Signed)
Refilled: 03/20/2017 Last OV: 04/09/2017 Next OV: 07/10/2017

## 2017-05-19 DIAGNOSIS — E1142 Type 2 diabetes mellitus with diabetic polyneuropathy: Secondary | ICD-10-CM | POA: Diagnosis not present

## 2017-05-19 DIAGNOSIS — L97521 Non-pressure chronic ulcer of other part of left foot limited to breakdown of skin: Secondary | ICD-10-CM | POA: Diagnosis not present

## 2017-06-02 DIAGNOSIS — L97521 Non-pressure chronic ulcer of other part of left foot limited to breakdown of skin: Secondary | ICD-10-CM | POA: Diagnosis not present

## 2017-06-02 DIAGNOSIS — E1142 Type 2 diabetes mellitus with diabetic polyneuropathy: Secondary | ICD-10-CM | POA: Diagnosis not present

## 2017-06-06 ENCOUNTER — Ambulatory Visit (INDEPENDENT_AMBULATORY_CARE_PROVIDER_SITE_OTHER): Payer: Medicare Other

## 2017-06-06 ENCOUNTER — Encounter (INDEPENDENT_AMBULATORY_CARE_PROVIDER_SITE_OTHER): Payer: Self-pay | Admitting: Vascular Surgery

## 2017-06-06 ENCOUNTER — Other Ambulatory Visit (INDEPENDENT_AMBULATORY_CARE_PROVIDER_SITE_OTHER): Payer: Self-pay | Admitting: Vascular Surgery

## 2017-06-06 ENCOUNTER — Ambulatory Visit (INDEPENDENT_AMBULATORY_CARE_PROVIDER_SITE_OTHER): Payer: Medicare Other | Admitting: Vascular Surgery

## 2017-06-06 ENCOUNTER — Encounter

## 2017-06-06 VITALS — BP 131/68 | HR 83 | Resp 16 | Ht 69.0 in | Wt 187.0 lb

## 2017-06-06 DIAGNOSIS — I10 Essential (primary) hypertension: Secondary | ICD-10-CM | POA: Diagnosis not present

## 2017-06-06 DIAGNOSIS — Z794 Long term (current) use of insulin: Secondary | ICD-10-CM | POA: Diagnosis not present

## 2017-06-06 DIAGNOSIS — N183 Chronic kidney disease, stage 3 unspecified: Secondary | ICD-10-CM

## 2017-06-06 DIAGNOSIS — E1122 Type 2 diabetes mellitus with diabetic chronic kidney disease: Secondary | ICD-10-CM

## 2017-06-06 DIAGNOSIS — I739 Peripheral vascular disease, unspecified: Secondary | ICD-10-CM

## 2017-06-06 DIAGNOSIS — I7025 Atherosclerosis of native arteries of other extremities with ulceration: Secondary | ICD-10-CM | POA: Diagnosis not present

## 2017-06-06 DIAGNOSIS — E785 Hyperlipidemia, unspecified: Secondary | ICD-10-CM

## 2017-06-06 DIAGNOSIS — Z9889 Other specified postprocedural states: Secondary | ICD-10-CM | POA: Diagnosis not present

## 2017-06-06 NOTE — Progress Notes (Signed)
MRN : [id number]  [name] is a 84 y.o. (07-04-1924) male who presents with chief complaint of  Chief Complaint  Patient presents with  . Follow-up    ultrasound  .  History of Present Illness: Patient returns today in follow up of PAD.  He has developed an ulceration on the top of toes 3 on the left foot.  This has essentially replaced his toenail in that location.  He has been followed by podiatry who continues to follow him.  He has a known significant history of PAD and underwent a right leg bypass and subsequent tibial intervention some years ago.  His noninvasive studies today show noncompressible vessels bilaterally which is stable.  His digital pressures are also reasonably stable although somewhat reduced bilaterally.  They are 69 on the right and 51 on the left.  Current Outpatient Medications  Medication Sig Dispense Refill  . amLODipine (NORVASC) 10 MG tablet TAKE 1 TABLET (10 MG TOTAL) BY MOUTH DAILY. 90 tablet 2  . aspirin 325 MG tablet Take 325 mg by mouth daily.      Marland Kitchen atorvastatin (LIPITOR) 20 MG tablet TAKE 1 TABLET (20 MG TOTAL) BY MOUTH DAILY. 90 tablet 1  . Calcium Carbonate-Vitamin D (CALCIUM + D) 600-200 MG-UNIT TABS Take 1 tablet by mouth 2 (two) times daily.      . clopidogrel (PLAVIX) 75 MG tablet TAKE 1 TABLET (75 MG TOTAL) BY MOUTH DAILY. 90 tablet 1  . cyanocobalamin (,VITAMIN B-12,) 1000 MCG/ML injection INJECT 1 ML (1,000 MCG TOTAL) INTO THE MUSCLE EVERY 30 (THIRTY) DAYS. 10 mL 0  . fenofibrate (TRICOR) 48 MG tablet TAKE 1 TABLET BY MOUTH DAILY 90 tablet 1  . furosemide (LASIX) 20 MG tablet TAKE 1 TABLET (20 MG TOTAL) BY MOUTH DAILY. AS NEEDED FOR FLUID RETENTION 90 tablet 1  . glucose blood (ACCU-CHEK AVIVA PLUS) test strip CHECK BLOOD SUGAR 4 TIMES A DAY AS NEEDED DX E1.51 400 each 5  . hydrocortisone 2.5 % cream Apply 1 application topically 2 (two) times daily as needed.    . insulin aspart protamine - aspart (NOVOLOG MIX 70/30 FLEXPEN) (70-30)  100 UNIT/ML FlexPen INJECT 40 units two times daily , adjust dose using sliding scale 75 pen 1  . Insulin Pen Needle (B-D ULTRAFINE III SHORT PEN) 31G X 8 MM MISC USE AS DIRECTED 3 TIMES A DAY 300 each 5  . Insulin Syringe-Needle U-100 (B-D INS SYRINGE 0.5CC/31GX5/16) 31G X 5/16" 0.5 ML MISC 1 application by Does not apply route 3 (three) times daily before meals. 300 each 3  . latanoprost (XALATAN) 0.005 % ophthalmic solution Place 1 drop into both eyes at bedtime.  3  . metoprolol tartrate (LOPRESSOR) 100 MG tablet Take 1 tablet (100 mg total) by mouth 2 (two) times daily. 180 tablet 1  . NOVOLOG MIX 70/30 FLEXPEN (70-30) 100 UNIT/ML FlexPen INJECT 21 UNITS IN THE AM AND 41 UNITS IN THE PM PRE MEAL 60 pen 5  . Syringe, Disposable, 3 ML MISC For use with B12 injections weekly/monthly 25 each 0  . SYRINGE-NEEDLE, DISP, 3 ML (B-D 3CC LUER-LOK SYR 25GX1") 25G X 1" 3 ML MISC FOR USE WITH B12 INJECTIONS WEEKLY/MONTHLY 50 each 1  . valsartan (DIOVAN) 160 MG tablet TAKE 1 TABLET BY MOUTH DAILY 90 tablet 1  . gabapentin (NEURONTIN) 100 MG capsule Take 1 capsule (100 mg total) by mouth 3 (three) times daily. 90 capsule 3  . losartan (COZAAR) 100 MG tablet TAKE  1 TABLET BY MOUTH EVERY DAY (Patient not taking: Reported on 06/06/2017) 90 tablet 0   No current facility-administered medications for this visit.     Past Medical History:  Diagnosis Date  . Blood transfusion   . Coronary artery disease    h/o bypass coronary- 2001 & peripheral- 2011 , last full cardiac visit  in [place] , [place], [employer]  . Diabetes mellitus   . H/O: malaria 85   in [place]  . Hyperlipidemia   . Hypertension   . Peripheral artery disease ([place])    folowed by [place] bypass 2011 [place]  . Renal insufficiency    by Nov 2012 labs, no old records available    Past Surgical History:  Procedure Laterality Date  . CORONARY ARTERY BYPASS GRAFT  2001   4 vessel, done in [place]   . FEMORAL BYPASS  2011   done in  [place],  now followed by Leotis Pain  . INSERT / REPLACE / REMOVE PACEMAKER     2004  . LUMBAR LAMINECTOMY/DECOMPRESSION MICRODISCECTOMY  01/16/2011   Procedure: LUMBAR LAMINECTOMY/DECOMPRESSION MICRODISCECTOMY;  Surgeon: Ophelia Charter;  Location: [facility];  Service: Neurosurgery;  Laterality: N/A;  Lumbar four and lumbar five laminectomies   . PACEMAKER INSERTION    . SPINE SURGERY      Social History      Social History  Substance Use Topics  . Smoking status: Former Smoker    Quit date: 09/27/1985  . Smokeless tobacco: Never Used  . Alcohol use No      Family History      Family History  Problem Relation Age of Onset  . Mental illness Mother 42       alzheimers  . Peripheral vascular disease Father   . Diabetes Father   . Anesthesia problems Neg Hx   . Hypotension Neg Hx   . Malignant hyperthermia Neg Hx   . Pseudochol deficiency Neg Hx      No Known Allergies   REVIEW OF SYSTEMS (Negative unless checked)  Constitutional: [] Weight loss  [] Fever  [] Chills Cardiac: [] Chest pain   [] Chest pressure   [] Palpitations   [] Shortness of breath when laying flat   [] Shortness of breath at rest   [] Shortness of breath with exertion. Vascular:  [] Pain in legs with walking   [] Pain in legs at rest   [] Pain in legs when laying flat   [] Claudication   [] Pain in feet when walking  [] Pain in feet at rest  [] Pain in feet when laying flat   [] History of DVT   [] Phlebitis   [] Swelling in legs   [] Varicose veins   [x] Non-healing ulcers Pulmonary:   [] Uses home oxygen   [] Productive cough   [] Hemoptysis   [] Wheeze  [] COPD   [] Asthma Neurologic:  [] Dizziness  [] Blackouts   [] Seizures   [] History of stroke   [] History of TIA  [] Aphasia   [] Temporary blindness   [] Dysphagia   [] Weakness or numbness in arms   [] Weakness or numbness in legs Musculoskeletal:  [x] Arthritis   [] Joint swelling   [] Joint pain   [] Low back pain Hematologic:  [] Easy bruising  [] Easy bleeding    [] Hypercoagulable state   [] Anemic   Gastrointestinal:  [] Blood in stool   [] Vomiting blood  [] Gastroesophageal reflux/heartburn   [] Abdominal pain Genitourinary:  [x] Chronic kidney disease   [] Difficult urination  [] Frequent urination  [] Burning with urination   [] Hematuria Skin:  [] Rashes   [x] Ulcers   [x] Wounds Psychological:  [] History  of anxiety   []  History of major depression.     Physical Examination  BP 131/68   Pulse 83   Resp 16   Ht 5\' 9"  (1.753 m)   Wt 84.8 kg (187 lb)   BMI 27.62 kg/m  Gen:  WD/WN, NAD.  Appears younger than stated age. Head: [place]/AT, No temporalis wasting. Ear/Nose/Throat: Hearing grossly intact, nares w/o erythema or drainage Eyes: Conjunctiva clear. Sclera non-icteric Neck: Supple.  Trachea midline Pulmonary:  Good air movement, no use of accessory muscles.  Cardiac: RRR, no JVD Vascular:  Vessel Right Left  Radial Palpable Palpable                          PT  1+ palpable  1+ palpable  DP  1+ palpable  not palpable    Musculoskeletal: M/S 5/5 throughout.  Superficial ulceration on the top of the left third toe which has essentially replaced his nail. Neurologic: Sensation grossly intact in extremities.  Symmetrical.  Speech is fluent.  Psychiatric: Judgment intact, Mood & affect appropriate for pt's clinical situation. Dermatologic: Ulcer on the left third toe as above       Labs Recent Results (from the past 2160 hour(s))  Lipid panel     Status: Abnormal   Collection Time: 04/07/17  9:01 AM  Result Value Ref Range   Cholesterol 102 0 - 200 mg/dL    Comment: ATP III Classification       Desirable:  < 200 mg/dL               Borderline High:  200 - 239 mg/dL          High:  > = 240 mg/dL   Triglycerides 119.0 0.0 - 149.0 mg/dL    Comment: Normal:  <150 mg/dLBorderline High:  150 - 199 mg/dL   HDL 34.00 (L) >39.00 mg/dL   VLDL 23.8 0.0 - 40.0 mg/dL   LDL Cholesterol 44 0 - 99 mg/dL   Total CHOL/HDL Ratio 3     Comment:                 Men          Women1/2 Average Risk     3.4          3.3Average Risk          5.0          4.42X Average Risk          9.6          7.13X Average Risk          15.0          11.0                       NonHDL 67.62     Comment: NOTE:  Non-HDL goal should be 30 mg/dL higher than patient's LDL goal (i.e. LDL goal of < 70 mg/dL, would have non-HDL goal of < 100 mg/dL)  Microalbumin / creatinine urine ratio     Status: Abnormal   Collection Time: 04/07/17  9:01 AM  Result Value Ref Range   Microalb, Ur 3.1 (H) 0.0 - 1.9 mg/dL   Creatinine,U 55.7 mg/dL   Microalb Creat Ratio 5.5 0.0 - 30.0 mg/g  Comprehensive metabolic panel     Status: Abnormal   Collection Time: 04/07/17  9:01 AM  Result Value Ref Range   Sodium 143 135 -  145 mEq/L   Potassium 5.0 3.5 - 5.1 mEq/L   Chloride 106 96 - 112 mEq/L   CO2 30 19 - 32 mEq/L   Glucose, Bld 166 (H) 70 - 99 mg/dL   BUN 47 (H) 6 - 23 mg/dL   Creatinine, Ser 1.89 (H) 0.40 - 1.50 mg/dL   Total Bilirubin 0.6 0.2 - 1.2 mg/dL   Alkaline Phosphatase 48 39 - 117 U/L   AST 14 0 - 37 U/L   ALT 14 0 - 53 U/L   Total Protein 7.1 6.0 - 8.3 g/dL   Albumin 3.8 3.5 - 5.2 g/dL   Calcium 10.3 8.4 - 10.5 mg/dL   GFR 35.74 (L) >60.00 mL/min  Hemoglobin A1c     Status: Abnormal   Collection Time: 04/07/17  9:01 AM  Result Value Ref Range   Hgb A1c MFr Bld 7.5 (H) 4.6 - 6.5 %    Comment: Glycemic Control Guidelines for People with Diabetes:Non Diabetic:  <6%Goal of Therapy: <7%Additional Action Suggested:  >8%     Radiology No results found.  Assessment/Plan Diabetes mellitus with peripheral vascular disease (HCC) blood glucose control important in reducing the progression of atherosclerotic disease. Also, involved in wound healing. On appropriate medications.   Hypertension blood pressure control important in reducing the progression of atherosclerotic disease. On appropriate oral medications.   Hyperlipidemia with target LDL less than  70 lipid control important in reducing the progression of atherosclerotic disease. Continue statin therapy   Atherosclerosis of native arteries of the extremities with ulceration ([place]) His noninvasive studies today show noncompressible vessels bilaterally which is stable.  His digital pressures are also reasonably stable although somewhat reduced bilaterally.  They are 69 on the right and 51 on the left. This is a more worrisome situation and we have been following previously now that he has an ulceration.  We discussed at this point it would certainly be reasonable to consider angiogram with possible revascularization.  His foot has been stable and his perfusion is fair and has not dramatically dropped and is actually slightly increased from his last visit.  With that in mind he would prefer continuing to monitor this with close interval follow-up which is a reasonable option.  He is scheduled to come back and see me in about 2 months, and I have told him to keep that appointment unless things develop worsening problems in the interim.     Leotis Pain, MD  06/06/2017 3:01 PM    This note was created with Dragon medical transcription system.  Any errors from dictation are purely unintentional

## 2017-06-06 NOTE — Patient Instructions (Signed)

## 2017-06-06 NOTE — Assessment & Plan Note (Signed)
His noninvasive studies today show noncompressible vessels bilaterally which is stable.  His digital pressures are also reasonably stable although somewhat reduced bilaterally.  They are 69 on the right and 51 on the left. This is a more worrisome situation and we have been following previously now that he has an ulceration.  We discussed at this point it would certainly be reasonable to consider angiogram with possible revascularization.  His foot has been stable and his perfusion is fair and has not dramatically dropped and is actually slightly increased from his last visit.  With that in mind he would prefer continuing to monitor this with close interval follow-up which is a reasonable option.  He is scheduled to come back and see me in about 2 months, and I have told him to keep that appointment unless things develop worsening problems in the interim.

## 2017-06-16 DIAGNOSIS — E1142 Type 2 diabetes mellitus with diabetic polyneuropathy: Secondary | ICD-10-CM | POA: Diagnosis not present

## 2017-06-16 DIAGNOSIS — L97521 Non-pressure chronic ulcer of other part of left foot limited to breakdown of skin: Secondary | ICD-10-CM | POA: Diagnosis not present

## 2017-06-20 ENCOUNTER — Ambulatory Visit (INDEPENDENT_AMBULATORY_CARE_PROVIDER_SITE_OTHER): Payer: Medicare Other | Admitting: Vascular Surgery

## 2017-06-20 ENCOUNTER — Encounter

## 2017-06-20 ENCOUNTER — Encounter (INDEPENDENT_AMBULATORY_CARE_PROVIDER_SITE_OTHER): Payer: Medicare Other

## 2017-06-24 DIAGNOSIS — Z08 Encounter for follow-up examination after completed treatment for malignant neoplasm: Secondary | ICD-10-CM | POA: Diagnosis not present

## 2017-06-24 DIAGNOSIS — Z85828 Personal history of other malignant neoplasm of skin: Secondary | ICD-10-CM | POA: Diagnosis not present

## 2017-06-24 DIAGNOSIS — L89312 Pressure ulcer of right buttock, stage 2: Secondary | ICD-10-CM | POA: Diagnosis not present

## 2017-06-24 DIAGNOSIS — L570 Actinic keratosis: Secondary | ICD-10-CM | POA: Diagnosis not present

## 2017-06-24 DIAGNOSIS — X32XXXA Exposure to sunlight, initial encounter: Secondary | ICD-10-CM | POA: Diagnosis not present

## 2017-06-28 ENCOUNTER — Other Ambulatory Visit: Payer: Self-pay | Admitting: Internal Medicine

## 2017-07-01 DIAGNOSIS — H40013 Open angle with borderline findings, low risk, bilateral: Secondary | ICD-10-CM | POA: Diagnosis not present

## 2017-07-01 DIAGNOSIS — E119 Type 2 diabetes mellitus without complications: Secondary | ICD-10-CM | POA: Diagnosis not present

## 2017-07-01 DIAGNOSIS — H52223 Regular astigmatism, bilateral: Secondary | ICD-10-CM | POA: Diagnosis not present

## 2017-07-01 DIAGNOSIS — H5212 Myopia, left eye: Secondary | ICD-10-CM | POA: Diagnosis not present

## 2017-07-01 DIAGNOSIS — H524 Presbyopia: Secondary | ICD-10-CM | POA: Diagnosis not present

## 2017-07-01 LAB — HM DIABETES EYE EXAM

## 2017-07-02 ENCOUNTER — Other Ambulatory Visit: Payer: Self-pay | Admitting: Internal Medicine

## 2017-07-02 DIAGNOSIS — L97521 Non-pressure chronic ulcer of other part of left foot limited to breakdown of skin: Secondary | ICD-10-CM | POA: Diagnosis not present

## 2017-07-02 DIAGNOSIS — E1142 Type 2 diabetes mellitus with diabetic polyneuropathy: Secondary | ICD-10-CM | POA: Diagnosis not present

## 2017-07-05 ENCOUNTER — Encounter: Payer: Self-pay | Admitting: Internal Medicine

## 2017-07-09 ENCOUNTER — Other Ambulatory Visit: Payer: Self-pay | Admitting: Internal Medicine

## 2017-07-09 ENCOUNTER — Other Ambulatory Visit (INDEPENDENT_AMBULATORY_CARE_PROVIDER_SITE_OTHER): Payer: Medicare Other

## 2017-07-09 DIAGNOSIS — Z794 Long term (current) use of insulin: Secondary | ICD-10-CM | POA: Diagnosis not present

## 2017-07-09 DIAGNOSIS — N183 Chronic kidney disease, stage 3 (moderate): Secondary | ICD-10-CM | POA: Diagnosis not present

## 2017-07-09 DIAGNOSIS — E1122 Type 2 diabetes mellitus with diabetic chronic kidney disease: Secondary | ICD-10-CM

## 2017-07-09 LAB — COMPREHENSIVE METABOLIC PANEL
ALBUMIN: 4 g/dL (ref 3.5–5.2)
ALT: 12 U/L (ref 0–53)
AST: 13 U/L (ref 0–37)
Alkaline Phosphatase: 50 U/L (ref 39–117)
BUN: 25 mg/dL — ABNORMAL HIGH (ref 6–23)
CALCIUM: 9.3 mg/dL (ref 8.4–10.5)
CHLORIDE: 107 meq/L (ref 96–112)
CO2: 25 meq/L (ref 19–32)
Creatinine, Ser: 1.58 mg/dL — ABNORMAL HIGH (ref 0.40–1.50)
GFR: 43.92 mL/min — AB (ref >60.00)
Glucose, Bld: 185 mg/dL — ABNORMAL HIGH (ref 70–99)
POTASSIUM: 4.3 meq/L (ref 3.5–5.1)
Sodium: 140 mEq/L (ref 135–145)
Total Bilirubin: 0.6 mg/dL (ref 0.2–1.2)
Total Protein: 6.4 g/dL (ref 6.0–8.3)

## 2017-07-09 LAB — HEMOGLOBIN A1C: HEMOGLOBIN A1C: 6.8 % — AB (ref 4.6–6.5)

## 2017-07-10 ENCOUNTER — Encounter: Payer: Self-pay | Admitting: Internal Medicine

## 2017-07-10 ENCOUNTER — Ambulatory Visit (INDEPENDENT_AMBULATORY_CARE_PROVIDER_SITE_OTHER): Payer: Medicare Other | Admitting: Internal Medicine

## 2017-07-10 DIAGNOSIS — N183 Chronic kidney disease, stage 3 unspecified: Secondary | ICD-10-CM

## 2017-07-10 DIAGNOSIS — R600 Localized edema: Secondary | ICD-10-CM | POA: Diagnosis not present

## 2017-07-10 DIAGNOSIS — Z794 Long term (current) use of insulin: Secondary | ICD-10-CM | POA: Diagnosis not present

## 2017-07-10 DIAGNOSIS — E1122 Type 2 diabetes mellitus with diabetic chronic kidney disease: Secondary | ICD-10-CM

## 2017-07-10 DIAGNOSIS — I7025 Atherosclerosis of native arteries of other extremities with ulceration: Secondary | ICD-10-CM

## 2017-07-10 MED ORDER — FUROSEMIDE 20 MG PO TABS: 40.0000 mg | ORAL_TABLET | Freq: Every day | ORAL | 1 refills | Status: DC

## 2017-07-10 NOTE — Progress Notes (Signed)
Subjective:  Patient ID: [name], male    DOB: [birth year]  Age: 84 y.o. MRN: [id number]  CC: Diagnoses of Localized edema and Type 2 diabetes mellitus with stage 3 chronic kidney disease, with long-term current use of insulin ([place]) were pertinent to this visit.  HPI [name] presents for 3 month follow up on diabetes.  Patient hasn't felt well for the past week, but his symptoms are nonspecific.  "I feel like my BS is low  but  I've been checking it and it is not.  Describes feeling a little weak,  Not dizzy, legs feel weak,  Not exercising due to this feeling.  Denies chest pain , dyspnea and orthopnea . Feeling is not constant.    Has gained 13 lbs since March.   Takes furosemide 20 mg once daily  For years.  Has moerate  aortic stenosis  By Last ECHO 2017 (Paraschos) EF 45%  Has a pacemaker  Has an appt coming up with paraschos not sure when .  Taking amlodipine 10 mg daily and losartan 100 mg daily.   Lab Results  Component Value Date   HGBA1C 6.8 (H) 07/09/2017      Does not do yardowrk or work outside.  House is air conditioned.  Not exercisig lately due to leg weakness.    Patient is following a low glycemic index diet and taking all prescribed medications regularly without side effects.  Fasting sugars have been under less than 140 most of the time and post prandials have been under 160 except on rare occasions. Patient is exercising about 3 times per week and intentionally trying to lose weight .  Patient has had an eye exam in the last 12 months and checks feet regularly for signs of infection.  Patient does not walk barefoot outside,  And denies an numbness tingling or burning in feet. Patient is up to date on all recommended vaccinations  Hypertension: patient checks blood pressure twice weekly at home.  Readings have been for the most part > 140/80 at rest . Patient is following a reduce salt diet most days and is taking medications as prescribed  Outpatient  Medications Prior to Visit  Medication Sig Dispense Refill  . amLODipine (NORVASC) 10 MG tablet TAKE 1 TABLET (10 MG TOTAL) BY MOUTH DAILY. 90 tablet 2  . aspirin 325 MG tablet Take 325 mg by mouth daily.      Marland Kitchen atorvastatin (LIPITOR) 20 MG tablet TAKE 1 TABLET (20 MG TOTAL) BY MOUTH DAILY. 90 tablet 1  . Calcium Carbonate-Vitamin D (CALCIUM + D) 600-200 MG-UNIT TABS Take 1 tablet by mouth 2 (two) times daily.      . clopidogrel (PLAVIX) 75 MG tablet TAKE 1 TABLET (75 MG TOTAL) BY MOUTH DAILY. 90 tablet 1  . cyanocobalamin (,VITAMIN B-12,) 1000 MCG/ML injection INJECT 1 ML (1,000 MCG TOTAL) INTO THE MUSCLE EVERY 30 (THIRTY) DAYS. 10 mL 0  . fenofibrate (TRICOR) 48 MG tablet TAKE 1 TABLET BY MOUTH DAILY 90 tablet 1  . gabapentin (NEURONTIN) 100 MG capsule Take 1 capsule (100 mg total) by mouth 3 (three) times daily. 90 capsule 3  . glucose blood (ACCU-CHEK AVIVA PLUS) test strip CHECK BLOOD SUGAR 4 TIMES A DAY AS NEEDED DX E1.51 400 each 5  . hydrocortisone 2.5 % cream Apply 1 application topically 2 (two) times daily as needed.    . insulin aspart protamine - aspart (NOVOLOG MIX 70/30 FLEXPEN) (70-30) 100 UNIT/ML FlexPen INJECT 40 units  two times daily , adjust dose using sliding scale 75 pen 1  . Insulin Pen Needle (B-D ULTRAFINE III SHORT PEN) 31G X 8 MM MISC USE AS DIRECTED 3 TIMES A DAY 300 each 5  . Insulin Syringe-Needle U-100 (B-D INS SYRINGE 0.5CC/31GX5/16) 31G X 5/16" 0.5 ML MISC 1 application by Does not apply route 3 (three) times daily before meals. 300 each 3  . latanoprost (XALATAN) 0.005 % ophthalmic solution Place 1 drop into both eyes at bedtime.  3  . losartan (COZAAR) 100 MG tablet TAKE 1 TABLET BY MOUTH EVERY DAY 90 tablet 1  . metoprolol tartrate (LOPRESSOR) 100 MG tablet TAKE 1 TABLET BY MOUTH TWICE A DAY 180 tablet 1  . Syringe, Disposable, 3 ML MISC For use with B12 injections weekly/monthly 25 each 0  . SYRINGE-NEEDLE, DISP, 3 ML (B-D 3CC LUER-LOK SYR 25GX1") 25G X 1" 3 ML  MISC FOR USE WITH B12 INJECTIONS WEEKLY/MONTHLY 50 each 1  . furosemide (LASIX) 20 MG tablet TAKE 1 TABLET (20 MG TOTAL) BY MOUTH DAILY. AS NEEDED FOR FLUID RETENTION 90 tablet 1  . NOVOLOG MIX 70/30 FLEXPEN (70-30) 100 UNIT/ML FlexPen INJECT 21 UNITS IN THE AM AND 41 UNITS IN THE PM PRE MEAL (Patient not taking: Reported on 07/10/2017) 60 pen 5  . valsartan (DIOVAN) 160 MG tablet TAKE 1 TABLET BY MOUTH DAILY (Patient not taking: Reported on 07/10/2017) 90 tablet 1   No facility-administered medications prior to visit.     Review of Systems;  Patient denies headache, fevers, malaise, unintentional weight loss, skin rash, eye pain, sinus congestion and sinus pain, sore throat, dysphagia,  hemoptysis , cough, dyspnea, wheezing, chest pain, palpitations, orthopnea, edema, abdominal pain, nausea, melena, diarrhea, constipation, flank pain, dysuria, hematuria, urinary  Frequency, nocturia, numbness, tingling, seizures,  Focal weakness, Loss of consciousness,  Tremor, insomnia, depression, anxiety, and suicidal ideation.      Objective:  BP 138/80 (BP Location: Left Arm, Patient Position: Sitting, Cuff Size: Normal)   Pulse 82   Temp 97.9 F (36.6 C) (Oral)   Resp 16   Ht 5\' 9"  (1.753 m)   Wt 198 lb 12.8 oz (90.2 kg)   SpO2 96%   BMI 29.36 kg/m   BP Readings from Last 3 Encounters:  07/10/17 138/80  06/06/17 131/68  04/09/17 132/78    Wt Readings from Last 3 Encounters:  07/10/17 198 lb 12.8 oz (90.2 kg)  06/06/17 187 lb (84.8 kg)  04/09/17 185 lb 8 oz (84.1 kg)    General appearance: alert, cooperative and appears stated age Ears: normal TM's and external ear canals both ears Throat: lips, mucosa, and tongue normal; teeth and gums normal Neck: no adenopathy, no carotid bruit, supple, symmetrical, trachea midline and thyroid not enlarged, symmetric, no tenderness/mass/nodules Back: symmetric, no curvature. ROM normal. No CVA tenderness. Lungs: clear to auscultation  bilaterally Heart: regular rate and rhythm, S1, S2 normal, no murmur, click, rub or gallop Abdomen: soft, non-tender; bowel sounds normal; no masses,  no organomegaly Pulses: 2+ and symmetric Skin: 2+  Pitting  edema bilaterally to legs . Skin color, texture, turgor normal. No rashes or lesions Lymph nodes: Cervical, supraclavicular, and axillary nodes normal.  Lab Results  Component Value Date   HGBA1C 6.8 (H) 07/09/2017   HGBA1C 7.5 (H) 04/07/2017   HGBA1C 7.1 (H) 10/09/2016    Lab Results  Component Value Date   CREATININE 1.58 (H) 07/09/2017   CREATININE 1.89 (H) 04/07/2017   CREATININE 1.77 (H) 10/09/2016  Lab Results  Component Value Date   WBC 6.4 01/09/2011   HGB 13.0 01/09/2011   HCT 40.0 01/09/2011   PLT 215 01/09/2011   GLUCOSE 185 (H) 07/09/2017   CHOL 102 04/07/2017   TRIG 119.0 04/07/2017   HDL 34.00 (L) 04/07/2017   LDLDIRECT 49.0 06/26/2015   LDLCALC 44 04/07/2017   ALT 12 07/09/2017   AST 13 07/09/2017   NA 140 07/09/2017   K 4.3 07/09/2017   CL 107 07/09/2017   CREATININE 1.58 (H) 07/09/2017   BUN 25 (H) 07/09/2017   CO2 25 07/09/2017   TSH 1.49 06/13/2014   HGBA1C 6.8 (H) 07/09/2017   MICROALBUR 3.1 (H) 04/07/2017    Dg Chest 2 View  Result Date: 01/09/2011 *RADIOLOGY REPORT* Clinical Data: Preop back surgery. CHEST - 2 VIEW Comparison: None. Findings: Post CABG.  Sequential pacemaker enters from the left with the tips at the level of the right atrium and right ventricle. Cardiomegaly. Central pulmonary vascular prominence. No infiltrate, congestive heart failure or pneumothorax. Calcified aorta.  Mild thoracic spine degenerative changes. IMPRESSION: Cardiomegaly with pacemaker in place. No infiltrate, congestive heart failure or pneumothorax. Original Report Authenticated By: Doug Sou, M.D.   Assessment & Plan:   Problem List Items Addressed This Visit    Edema    Etiology unclear  .  Has moderate aortic stenosis,  EF 45% by 2017  ECHO.  Takes amlodipine 10 mg, and gabapentin,  Chronically.   May be causative.  Has CKd.  Cr stable.  increase lasix dose to 40 mg daily for goal wt change of 10 lbs and follow up with paraschos asap for ECHO       Diabetes mellitus with chronic kidney disease ([place])     well-controlled on current medications.  hemoglobin A1c at or  less than 7.0 . Patient is up-to-date on eye exams and foot exam is unchanged today. Patient  Has had  urine microalbumin to creatinine ratio done within the past year. Patient is tolerating statin therapy for CAD risk reduction and on ACE/ARB for reduction in proteinuria.   Lab Results  Component Value Date   HGBA1C 6.8 (H) 07/09/2017   Lab Results  Component Value Date   MICROALBUR 3.1 (H) 04/07/2017          A total of 25 minutes of face to face time was spent with patient more than half of which was spent in counselling about the above mentioned conditions  and coordination of care  I have discontinued Yevonne Aline "Lyle Holaway"'s valsartan and NOVOLOG MIX 70/30 FLEXPEN. I have also changed his furosemide. Additionally, I am having him maintain his aspirin, Calcium Carbonate-Vitamin D, hydrocortisone, latanoprost, cyanocobalamin, amLODipine, Syringe (Disposable), Insulin Syringe-Needle U-100, SYRINGE-NEEDLE (DISP) 3 ML, Insulin Pen Needle, glucose blood, insulin aspart protamine - aspart, gabapentin, losartan, metoprolol tartrate, fenofibrate, clopidogrel, and atorvastatin.  Meds ordered this encounter  Medications  . furosemide (LASIX) 20 MG tablet    Sig: Take 2 tablets (40 mg total) by mouth daily. AS NEEDED FOR FLUID RETENTION    Dispense:  180 tablet    Refill:  1    Medications Discontinued During This Encounter  Medication Reason  . NOVOLOG MIX 70/30 FLEXPEN (70-30) 100 UNIT/ML FlexPen Patient has not taken in last 30 days  . valsartan (DIOVAN) 160 MG tablet Patient has not taken in last 30 days  . furosemide (LASIX) 20 MG tablet      Follow-up: No follow-ups on file.   Helene Kelp  Ether Griffins, MD

## 2017-07-10 NOTE — Assessment & Plan Note (Signed)
well-controlled on current medications.  hemoglobin A1c at or  less than 7.0 . Patient is up-to-date on eye exams and foot exam is unchanged today. Patient  Has had  urine microalbumin to creatinine ratio done within the past year. Patient is tolerating statin therapy for CAD risk reduction and on ACE/ARB for reduction in proteinuria.   Lab Results  Component Value Date   HGBA1C 6.8 (H) 07/09/2017   Lab Results  Component Value Date   MICROALBUR 3.1 (H) 04/07/2017

## 2017-07-10 NOTE — Assessment & Plan Note (Addendum)
Etiology unclear  .  Has moderate aortic stenosis,  EF 45% by 2017 ECHO.  Takes amlodipine 10 mg, and gabapentin,  Chronically.   May be causative.  Has CKd.  Cr stable.  increase lasix dose to 40 mg daily for goal wt change of 10 lbs and follow up with paraschos asap for ECHO

## 2017-07-10 NOTE — Patient Instructions (Addendum)
Your weight is up 13 lbs because you are retaining fluid (that'a why your legs feel weak ; THEY ARE HEAVY)   Increase your dose of furosemide to 40 mg daily every morning until your weight  Decreases  By 10 lbs   Make sure you see [facility] to discuss this and have an ECHO [place], because he may want you to follow a sodium restricted diet

## 2017-07-11 DIAGNOSIS — R600 Localized edema: Secondary | ICD-10-CM | POA: Diagnosis not present

## 2017-07-11 DIAGNOSIS — E119 Type 2 diabetes mellitus without complications: Secondary | ICD-10-CM | POA: Diagnosis not present

## 2017-07-11 DIAGNOSIS — I701 Atherosclerosis of renal artery: Secondary | ICD-10-CM | POA: Diagnosis not present

## 2017-07-11 DIAGNOSIS — I350 Nonrheumatic aortic (valve) stenosis: Secondary | ICD-10-CM | POA: Diagnosis not present

## 2017-07-11 DIAGNOSIS — I5031 Acute diastolic (congestive) heart failure: Secondary | ICD-10-CM | POA: Diagnosis not present

## 2017-07-11 DIAGNOSIS — Z951 Presence of aortocoronary bypass graft: Secondary | ICD-10-CM | POA: Diagnosis not present

## 2017-07-11 DIAGNOSIS — R011 Cardiac murmur, unspecified: Secondary | ICD-10-CM | POA: Diagnosis not present

## 2017-07-11 DIAGNOSIS — I10 Essential (primary) hypertension: Secondary | ICD-10-CM | POA: Diagnosis not present

## 2017-07-11 DIAGNOSIS — E7849 Other hyperlipidemia: Secondary | ICD-10-CM | POA: Diagnosis not present

## 2017-07-11 DIAGNOSIS — I739 Peripheral vascular disease, unspecified: Secondary | ICD-10-CM | POA: Diagnosis not present

## 2017-07-13 ENCOUNTER — Other Ambulatory Visit: Payer: Self-pay | Admitting: Internal Medicine

## 2017-07-16 ENCOUNTER — Encounter: Payer: Self-pay | Admitting: Internal Medicine

## 2017-07-16 DIAGNOSIS — I350 Nonrheumatic aortic (valve) stenosis: Secondary | ICD-10-CM | POA: Diagnosis not present

## 2017-07-16 DIAGNOSIS — Z951 Presence of aortocoronary bypass graft: Secondary | ICD-10-CM | POA: Diagnosis not present

## 2017-07-17 ENCOUNTER — Other Ambulatory Visit: Payer: Self-pay | Admitting: Internal Medicine

## 2017-07-17 DIAGNOSIS — N183 Chronic kidney disease, stage 3 unspecified: Secondary | ICD-10-CM

## 2017-07-17 NOTE — Progress Notes (Signed)
basic 

## 2017-07-23 ENCOUNTER — Ambulatory Visit (INDEPENDENT_AMBULATORY_CARE_PROVIDER_SITE_OTHER): Payer: Medicare Other

## 2017-07-23 DIAGNOSIS — L97521 Non-pressure chronic ulcer of other part of left foot limited to breakdown of skin: Secondary | ICD-10-CM | POA: Diagnosis not present

## 2017-07-23 DIAGNOSIS — N183 Chronic kidney disease, stage 3 unspecified: Secondary | ICD-10-CM

## 2017-07-23 DIAGNOSIS — E1159 Type 2 diabetes mellitus with other circulatory complications: Secondary | ICD-10-CM | POA: Diagnosis not present

## 2017-07-23 DIAGNOSIS — E1142 Type 2 diabetes mellitus with diabetic polyneuropathy: Secondary | ICD-10-CM | POA: Diagnosis not present

## 2017-07-23 LAB — RENAL FUNCTION PANEL
Albumin: 3.7 g/dL (ref 3.5–5.2)
BUN: 58 mg/dL — ABNORMAL HIGH (ref 6–23)
CO2: 28 mEq/L (ref 19–32)
Calcium: 9.3 mg/dL (ref 8.4–10.5)
Chloride: 106 mEq/L (ref 96–112)
Creatinine, Ser: 2.05 mg/dL — ABNORMAL HIGH (ref 0.40–1.50)
GFR: 32.52 mL/min — AB (ref >60.00)
GLUCOSE: 196 mg/dL — AB (ref 70–99)
PHOSPHORUS: 2.6 mg/dL (ref 2.3–4.6)
POTASSIUM: 4.2 meq/L (ref 3.5–5.1)
Sodium: 141 mEq/L (ref 135–145)

## 2017-07-23 NOTE — Progress Notes (Signed)
Pt came in today for weight and labs. Wt was 184.4.

## 2017-07-24 ENCOUNTER — Encounter: Payer: Self-pay | Admitting: Internal Medicine

## 2017-07-24 DIAGNOSIS — I701 Atherosclerosis of renal artery: Secondary | ICD-10-CM | POA: Diagnosis not present

## 2017-07-24 DIAGNOSIS — E119 Type 2 diabetes mellitus without complications: Secondary | ICD-10-CM | POA: Diagnosis not present

## 2017-07-24 DIAGNOSIS — E7849 Other hyperlipidemia: Secondary | ICD-10-CM | POA: Diagnosis not present

## 2017-07-24 DIAGNOSIS — I739 Peripheral vascular disease, unspecified: Secondary | ICD-10-CM | POA: Diagnosis not present

## 2017-07-24 DIAGNOSIS — R600 Localized edema: Secondary | ICD-10-CM | POA: Diagnosis not present

## 2017-07-24 DIAGNOSIS — Z951 Presence of aortocoronary bypass graft: Secondary | ICD-10-CM | POA: Diagnosis not present

## 2017-07-24 DIAGNOSIS — E1141 Type 2 diabetes mellitus with diabetic mononeuropathy: Secondary | ICD-10-CM

## 2017-07-24 DIAGNOSIS — I5031 Acute diastolic (congestive) heart failure: Secondary | ICD-10-CM | POA: Diagnosis not present

## 2017-07-24 DIAGNOSIS — I2510 Atherosclerotic heart disease of native coronary artery without angina pectoris: Secondary | ICD-10-CM | POA: Diagnosis not present

## 2017-07-24 DIAGNOSIS — I350 Nonrheumatic aortic (valve) stenosis: Secondary | ICD-10-CM | POA: Diagnosis not present

## 2017-07-24 DIAGNOSIS — I10 Essential (primary) hypertension: Secondary | ICD-10-CM | POA: Diagnosis not present

## 2017-07-25 MED ORDER — GLUCOSE BLOOD VI STRP: ORAL_STRIP | 5 refills | Status: AC

## 2017-07-25 MED ORDER — GABAPENTIN 300 MG PO CAPS: 300.0000 mg | ORAL_CAPSULE | Freq: Three times a day (TID) | ORAL | 0 refills | Status: DC

## 2017-07-25 NOTE — Assessment & Plan Note (Signed)
increasing dose to 300 mg tid.

## 2017-07-31 ENCOUNTER — Encounter: Payer: Self-pay | Admitting: Internal Medicine

## 2017-08-12 DIAGNOSIS — E1159 Type 2 diabetes mellitus with other circulatory complications: Secondary | ICD-10-CM | POA: Diagnosis not present

## 2017-08-12 DIAGNOSIS — L97521 Non-pressure chronic ulcer of other part of left foot limited to breakdown of skin: Secondary | ICD-10-CM | POA: Diagnosis not present

## 2017-08-12 DIAGNOSIS — E1142 Type 2 diabetes mellitus with diabetic polyneuropathy: Secondary | ICD-10-CM | POA: Diagnosis not present

## 2017-08-13 ENCOUNTER — Other Ambulatory Visit (INDEPENDENT_AMBULATORY_CARE_PROVIDER_SITE_OTHER): Payer: Self-pay | Admitting: Vascular Surgery

## 2017-08-13 ENCOUNTER — Encounter (INDEPENDENT_AMBULATORY_CARE_PROVIDER_SITE_OTHER): Payer: Self-pay

## 2017-08-15 ENCOUNTER — Encounter (INDEPENDENT_AMBULATORY_CARE_PROVIDER_SITE_OTHER): Payer: Medicare Other

## 2017-08-15 ENCOUNTER — Ambulatory Visit (INDEPENDENT_AMBULATORY_CARE_PROVIDER_SITE_OTHER): Payer: Medicare Other | Admitting: Vascular Surgery

## 2017-08-18 ENCOUNTER — Telehealth (INDEPENDENT_AMBULATORY_CARE_PROVIDER_SITE_OTHER): Payer: Self-pay

## 2017-08-18 ENCOUNTER — Encounter: Payer: Self-pay | Admitting: *Deleted

## 2017-08-18 ENCOUNTER — Encounter: Payer: Self-pay | Admitting: Anesthesiology

## 2017-08-18 ENCOUNTER — Ambulatory Visit
Admission: RE | Admit: 2017-08-18 | Discharge: 2017-08-18 | Disposition: A | Discharge status: Home / Self Care | Payer: Medicare Other | Source: Ambulatory Visit | Attending: Vascular Surgery | Admitting: Vascular Surgery

## 2017-08-18 ENCOUNTER — Encounter
Admission: RE | Disposition: A | Discharge status: Home / Self Care | Payer: Self-pay | Source: Ambulatory Visit | Attending: Vascular Surgery

## 2017-08-18 DIAGNOSIS — E1151 Type 2 diabetes mellitus with diabetic peripheral angiopathy without gangrene: Secondary | ICD-10-CM | POA: Insufficient documentation

## 2017-08-18 DIAGNOSIS — Z9889 Other specified postprocedural states: Secondary | ICD-10-CM | POA: Diagnosis not present

## 2017-08-18 DIAGNOSIS — E11621 Type 2 diabetes mellitus with foot ulcer: Secondary | ICD-10-CM | POA: Diagnosis not present

## 2017-08-18 DIAGNOSIS — I70248 Atherosclerosis of native arteries of left leg with ulceration of other part of lower left leg: Secondary | ICD-10-CM | POA: Diagnosis not present

## 2017-08-18 DIAGNOSIS — Z950 Presence of cardiac pacemaker: Secondary | ICD-10-CM | POA: Insufficient documentation

## 2017-08-18 DIAGNOSIS — L97521 Non-pressure chronic ulcer of other part of left foot limited to breakdown of skin: Secondary | ICD-10-CM | POA: Insufficient documentation

## 2017-08-18 DIAGNOSIS — Z951 Presence of aortocoronary bypass graft: Secondary | ICD-10-CM | POA: Diagnosis not present

## 2017-08-18 DIAGNOSIS — Z833 Family history of diabetes mellitus: Secondary | ICD-10-CM | POA: Diagnosis not present

## 2017-08-18 DIAGNOSIS — Z7901 Long term (current) use of anticoagulants: Secondary | ICD-10-CM | POA: Insufficient documentation

## 2017-08-18 DIAGNOSIS — Z79899 Other long term (current) drug therapy: Secondary | ICD-10-CM | POA: Insufficient documentation

## 2017-08-18 DIAGNOSIS — Z87891 Personal history of nicotine dependence: Secondary | ICD-10-CM | POA: Insufficient documentation

## 2017-08-18 DIAGNOSIS — E785 Hyperlipidemia, unspecified: Secondary | ICD-10-CM | POA: Insufficient documentation

## 2017-08-18 DIAGNOSIS — Z7982 Long term (current) use of aspirin: Secondary | ICD-10-CM | POA: Insufficient documentation

## 2017-08-18 DIAGNOSIS — Z8249 Family history of ischemic heart disease and other diseases of the circulatory system: Secondary | ICD-10-CM | POA: Diagnosis not present

## 2017-08-18 DIAGNOSIS — Z794 Long term (current) use of insulin: Secondary | ICD-10-CM | POA: Diagnosis not present

## 2017-08-18 DIAGNOSIS — I70299 Other atherosclerosis of native arteries of extremities, unspecified extremity: Secondary | ICD-10-CM

## 2017-08-18 DIAGNOSIS — L97909 Non-pressure chronic ulcer of unspecified part of unspecified lower leg with unspecified severity: Secondary | ICD-10-CM

## 2017-08-18 DIAGNOSIS — I10 Essential (primary) hypertension: Secondary | ICD-10-CM | POA: Diagnosis not present

## 2017-08-18 DIAGNOSIS — I7025 Atherosclerosis of native arteries of other extremities with ulceration: Secondary | ICD-10-CM | POA: Diagnosis not present

## 2017-08-18 HISTORY — PX: LOWER EXTREMITY ANGIOGRAPHY: CATH118251

## 2017-08-18 LAB — BUN: BUN: 48 mg/dL — ABNORMAL HIGH (ref 8–23)

## 2017-08-18 LAB — CREATININE, SERUM
Creatinine, Ser: 2.07 mg/dL — ABNORMAL HIGH (ref 0.61–1.24)
GFR calc Af Amer: 31 mL/min — ABNORMAL LOW (ref >60)
GFR calc non Af Amer: 27 mL/min — ABNORMAL LOW (ref >60)

## 2017-08-18 SURGERY — LOWER EXTREMITY ANGIOGRAPHY
Anesthesia: Moderate Sedation | Laterality: Left

## 2017-08-18 MED ORDER — SODIUM CHLORIDE 0.9% FLUSH: 3.0000 mL | Freq: Two times a day (BID) | INTRAVENOUS | Status: DC

## 2017-08-18 MED ORDER — HYDROMORPHONE HCL 1 MG/ML IJ SOLN: 1.0000 mg | Freq: Once | INTRAMUSCULAR | Status: DC | PRN

## 2017-08-18 MED ORDER — MIDAZOLAM HCL 2 MG/2ML IJ SOLN
INTRAMUSCULAR | Status: DC | PRN
  Administered 2017-08-18: 1 mg via INTRAVENOUS
  Administered 2017-08-18 (×2): 0.5 mg via INTRAVENOUS

## 2017-08-18 MED ORDER — MIDAZOLAM HCL 5 MG/5ML IJ SOLN
INTRAMUSCULAR | Status: AC
  Filled 2017-08-18: qty 5

## 2017-08-18 MED ORDER — SODIUM CHLORIDE 0.9 % IV SOLN: Freq: Once | INTRAVENOUS | Status: DC

## 2017-08-18 MED ORDER — ONDANSETRON HCL 4 MG/2ML IJ SOLN: 4.0000 mg | Freq: Four times a day (QID) | INTRAMUSCULAR | Status: DC | PRN

## 2017-08-18 MED ORDER — FENTANYL CITRATE (PF) 100 MCG/2ML IJ SOLN
INTRAMUSCULAR | Status: AC
  Filled 2017-08-18: qty 2

## 2017-08-18 MED ORDER — LABETALOL HCL 5 MG/ML IV SOLN: 10.0000 mg | INTRAVENOUS | Status: DC | PRN

## 2017-08-18 MED ORDER — SODIUM CHLORIDE 0.9 % IV SOLN
INTRAVENOUS | Status: DC
  Administered 2017-08-18: 11:00:00 via INTRAVENOUS

## 2017-08-18 MED ORDER — SODIUM CHLORIDE 0.9% FLUSH: 3.0000 mL | INTRAVENOUS | Status: DC | PRN

## 2017-08-18 MED ORDER — FENTANYL CITRATE (PF) 100 MCG/2ML IJ SOLN
INTRAMUSCULAR | Status: DC | PRN
  Administered 2017-08-18: 12.5 ug via INTRAVENOUS
  Administered 2017-08-18: 25 ug via INTRAVENOUS
  Administered 2017-08-18: 12.5 ug via INTRAVENOUS

## 2017-08-18 MED ORDER — HEPARIN SODIUM (PORCINE) 1000 UNIT/ML IJ SOLN
INTRAMUSCULAR | Status: AC
  Filled 2017-08-18: qty 1

## 2017-08-18 MED ORDER — FAMOTIDINE 20 MG PO TABS: 40.0000 mg | ORAL_TABLET | ORAL | Status: DC | PRN

## 2017-08-18 MED ORDER — SODIUM CHLORIDE 0.9 % IV SOLN: 250.0000 mL | INTRAVENOUS | Status: DC | PRN

## 2017-08-18 MED ORDER — CEFAZOLIN SODIUM-DEXTROSE 2-4 GM/100ML-% IV SOLN
2.0000 g | Freq: Once | INTRAVENOUS | Status: AC
  Administered 2017-08-18: 2 g via INTRAVENOUS

## 2017-08-18 MED ORDER — CEFAZOLIN SODIUM-DEXTROSE 2-4 GM/100ML-% IV SOLN
INTRAVENOUS | Status: AC
  Administered 2017-08-18: 2 g via INTRAVENOUS
  Filled 2017-08-18: qty 100

## 2017-08-18 MED ORDER — SODIUM CHLORIDE 0.9 % IV SOLN: INTRAVENOUS | Status: DC

## 2017-08-18 MED ORDER — IOPAMIDOL (ISOVUE-300) INJECTION 61%
INTRAVENOUS | Status: DC | PRN
  Administered 2017-08-18: 60 mL via INTRA_ARTERIAL

## 2017-08-18 MED ORDER — METHYLPREDNISOLONE SODIUM SUCC 125 MG IJ SOLR: 125.0000 mg | INTRAMUSCULAR | Status: DC | PRN

## 2017-08-18 MED ORDER — HYDRALAZINE HCL 20 MG/ML IJ SOLN: 5.0000 mg | INTRAMUSCULAR | Status: DC | PRN

## 2017-08-18 MED ORDER — ACETAMINOPHEN 325 MG PO TABS: 650.0000 mg | ORAL_TABLET | ORAL | Status: DC | PRN

## 2017-08-18 MED ORDER — HEPARIN SODIUM (PORCINE) 1000 UNIT/ML IJ SOLN
INTRAMUSCULAR | Status: DC | PRN
  Administered 2017-08-18: 5000 [IU] via INTRAVENOUS

## 2017-08-18 SURGICAL SUPPLY — 30 items
BALLN DORADO 5X200X135 (BALLOONS) ×2
BALLN LUTONIX  018 4X60X130 (BALLOONS) ×1
BALLN LUTONIX 018 4X60X130 (BALLOONS) ×1
BALLN LUTONIX 018 5X220X130 (BALLOONS) ×4
BALLN ULTRVRSE 3X300X150 (BALLOONS) ×1
BALLN ULTRVRSE 3X300X150 OTW (BALLOONS) ×1
BALLOON DORADO 5X200X135 (BALLOONS) ×1 IMPLANT
BALLOON LUTONIX 018 4X60X130 (BALLOONS) ×1 IMPLANT
BALLOON LUTONIX 018 5X220X130 (BALLOONS) ×2 IMPLANT
BALLOON ULTRVRSE 3X300X150 OTW (BALLOONS) ×1 IMPLANT
CATH BEACON 5 .035 40 KMP TP (CATHETERS) ×1 IMPLANT
CATH BEACON 5 .035 65 RIM TIP (CATHETERS) ×2 IMPLANT
CATH BEACON 5 .038 100 VERT TP (CATHETERS) ×2 IMPLANT
CATH BEACON 5 .038 40 KMP TP (CATHETERS) ×1
CATH CXI 4F 90 DAV (CATHETERS) ×2 IMPLANT
CATH CXI SUPP ANG 4FR 135 (CATHETERS) ×1 IMPLANT
CATH CXI SUPP ANG 4FR 135CM (CATHETERS) ×2
CATH PIG 70CM (CATHETERS) ×2 IMPLANT
DEVICE PRESTO INFLATION (MISCELLANEOUS) ×2 IMPLANT
DEVICE STARCLOSE SE CLOSURE (Vascular Products) ×2 IMPLANT
GLIDEWIRE ADV .035X260CM (WIRE) ×2 IMPLANT
GUIDEWIRE SUPER STIFF .035X180 (WIRE) ×2 IMPLANT
PACK ANGIOGRAPHY (CUSTOM PROCEDURE TRAY) ×2 IMPLANT
SHEATH ANL2 6FRX45 HC (SHEATH) ×2 IMPLANT
SHEATH BRITE TIP 4FRX11 (SHEATH) ×2 IMPLANT
SHEATH BRITE TIP 5FRX11 (SHEATH) ×4 IMPLANT
STENT VIABAHN 6X150X120 (Permanent Stent) ×2 IMPLANT
TUBING CONTRAST HIGH PRESS 72 (TUBING) ×2 IMPLANT
WIRE G V18X300CM (WIRE) ×4 IMPLANT
WIRE J 3MM .035X145CM (WIRE) ×2 IMPLANT

## 2017-08-18 NOTE — H&P (Signed)
[facility] Admission History & Physical  MRN : [id number]  [name] is a 84 y.o. ([birth year]) male who presents with chief complaint of No chief complaint on file. Marland Kitchen  History of Present Illness: Patient presents today for angiogram for left lower extremity evaluation and revascularization.  He has a long history of peripheral arterial disease and has previously undergone right leg bypass and tibial intervention.  He has had a nonhealing ulceration for the past 3 months or so to his left foot.  He has a known history of peripheral arterial disease with reduced perfusion and at this point, has failed local wound care with no improvement over the past couple of months.  His podiatrist is recommended he go ahead and have his perfusion evaluated and hopefully improved.  Not particularly painful.  No fevers or chills.  Current Outpatient Medications  Medication Sig Dispense Refill  . amLODipine (NORVASC) 10 MG tablet TAKE 1 TABLET (10 MG TOTAL) BY MOUTH DAILY. 90 tablet 2  . aspirin 325 MG tablet Take 325 mg by mouth daily.      Marland Kitchen atorvastatin (LIPITOR) 20 MG tablet TAKE 1 TABLET (20 MG TOTAL) BY MOUTH DAILY. 90 tablet 1  . Calcium Carbonate-Vitamin D (CALCIUM + D) 600-200 MG-UNIT TABS Take 1 tablet by mouth 2 (two) times daily.      . clopidogrel (PLAVIX) 75 MG tablet TAKE 1 TABLET (75 MG TOTAL) BY MOUTH DAILY. 90 tablet 1  . cyanocobalamin (,VITAMIN B-12,) 1000 MCG/ML injection INJECT 1 ML (1,000 MCG TOTAL) INTO THE MUSCLE EVERY 30 (THIRTY) DAYS. 10 mL 0  . fenofibrate (TRICOR) 48 MG tablet TAKE 1 TABLET BY MOUTH DAILY 90 tablet 1  . furosemide (LASIX) 20 MG tablet TAKE 1 TABLET (20 MG TOTAL) BY MOUTH DAILY. AS NEEDED FOR FLUID RETENTION 90 tablet 1  . glucose blood (ACCU-CHEK AVIVA PLUS) test strip CHECK BLOOD SUGAR 4 TIMES A DAY AS NEEDED DX E1.51 400 each 5  . hydrocortisone 2.5 % cream Apply 1 application topically 2 (two) times daily as needed.    .  insulin aspart protamine - aspart (NOVOLOG MIX 70/30 FLEXPEN) (70-30) 100 UNIT/ML FlexPen INJECT 40 units two times daily , adjust dose using sliding scale 75 pen 1  . Insulin Pen Needle (B-D ULTRAFINE III SHORT PEN) 31G X 8 MM MISC USE AS DIRECTED 3 TIMES A DAY 300 each 5  . Insulin Syringe-Needle U-100 (B-D INS SYRINGE 0.5CC/31GX5/16) 31G X 5/16" 0.5 ML MISC 1 application by Does not apply route 3 (three) times daily before meals. 300 each 3  . latanoprost (XALATAN) 0.005 % ophthalmic solution Place 1 drop into both eyes at bedtime.  3  . metoprolol tartrate (LOPRESSOR) 100 MG tablet Take 1 tablet (100 mg total) by mouth 2 (two) times daily. 180 tablet 1  . NOVOLOG MIX 70/30 FLEXPEN (70-30) 100 UNIT/ML FlexPen INJECT 21 UNITS IN THE AM AND 41 UNITS IN THE PM PRE MEAL 60 pen 5  . Syringe, Disposable, 3 ML MISC For use with B12 injections weekly/monthly 25 each 0  . SYRINGE-NEEDLE, DISP, 3 ML (B-D 3CC LUER-LOK SYR 25GX1") 25G X 1" 3 ML MISC FOR USE WITH B12 INJECTIONS WEEKLY/MONTHLY 50 each 1  . valsartan (DIOVAN) 160 MG tablet TAKE 1 TABLET BY MOUTH DAILY 90 tablet 1  . gabapentin (NEURONTIN) 100 MG capsule Take 1 capsule (100 mg total) by mouth 3 (three) times daily. 90 capsule 3  . losartan (COZAAR) 100 MG tablet TAKE  1 TABLET BY MOUTH EVERY DAY (Patient not taking: Reported on 06/06/2017) 90 tablet 0   No current facility-administered medications for this visit.         Past Medical History:  Diagnosis Date  . Blood transfusion   . Coronary artery disease    h/o bypass coronary- 2001 & peripheral- 2011 , last full cardiac visit  in [place] , [place], [employer]  . Diabetes mellitus   . H/O: malaria 32   in [place]  . Hyperlipidemia   . Hypertension   . Peripheral artery disease ([place])    folowed by [place] bypass 2011 [place]  . Renal insufficiency    by Nov 2012 labs, no old records available    Past Surgical History:  Procedure Laterality Date  .  CORONARY ARTERY BYPASS GRAFT  2001   4 vessel, done in [place]   . FEMORAL BYPASS  2011   done in [place],  now followed by Leotis Pain  . INSERT / REPLACE / REMOVE PACEMAKER     2004  . LUMBAR LAMINECTOMY/DECOMPRESSION MICRODISCECTOMY  01/16/2011   Procedure: LUMBAR LAMINECTOMY/DECOMPRESSION MICRODISCECTOMY;  Surgeon: Ophelia Charter;  Location: [facility];  Service: Neurosurgery;  Laterality: N/A;  Lumbar four and lumbar five laminectomies   . PACEMAKER INSERTION    . SPINE SURGERY      Social History      Social History  Substance Use Topics  . Smoking status: Former Smoker    Quit date: 09/27/1985  . Smokeless tobacco: Never Used  . Alcohol use No     Family History      Family History  Problem Relation Age of Onset  . Mental illness Mother 29   alzheimers  . Peripheral vascular disease Father   . Diabetes Father   . Anesthesia problems Neg Hx   . Hypotension Neg Hx   . Malignant hyperthermia Neg Hx   . Pseudochol deficiency Neg Hx      No Known Allergies   REVIEW OF SYSTEMS(Negative unless checked)  Constitutional: [] Weight loss[] Fever[] Chills Cardiac:[] Chest pain[] Chest pressure[] Palpitations [] Shortness of breath when laying flat [] Shortness of breath at rest [] Shortness of breath with exertion. Vascular: [] Pain in legs with walking[] Pain in legsat rest[] Pain in legs when laying flat [] Claudication [] Pain in feet when walking [] Pain in feet at rest [] Pain in feet when laying flat [] History of DVT [] Phlebitis [] Swelling in legs [] Varicose veins [x] Non-healing ulcers Pulmonary: [] Uses home oxygen [] Productive cough[] Hemoptysis [] Wheeze [] COPD [] Asthma Neurologic: [] Dizziness [] Blackouts [] Seizures [] History of stroke [] History of TIA[] Aphasia [] Temporary blindness[] Dysphagia [] Weaknessor numbness in arms [] Weakness or numbnessin  legs Musculoskeletal: [x] Arthritis [] Joint swelling [] Joint pain [] Low back pain Hematologic:[] Easy bruising[] Easy bleeding [] Hypercoagulable state [] Anemic  Gastrointestinal:[] Blood in stool[] Vomiting blood[] Gastroesophageal reflux/heartburn[] Abdominal pain Genitourinary: [x] Chronic kidney disease [] Difficulturination [] Frequenturination [] Burning with urination[] Hematuria Skin: [] Rashes [x] Ulcers [x] Wounds Psychological: [] History of anxiety[] History of major depression.      Physical Examination  There were no vitals filed for this visit. There is no height or weight on file to calculate BMI. Gen: WD/WN, NAD. Appears younger than stated age Head: Scraper/AT, No temporalis wasting.  Ear/Nose/Throat: Hearing diminished, nares w/o erythema or drainage, oropharynx w/o Erythema/Exudate,  Eyes: Conjunctiva clear, sclera non-icteric Neck: Trachea midline.  No JVD.  Pulmonary:  Good air movement, respirations not labored, no use of accessory muscles.  Cardiac: RRR, no JVD Vascular:  Vessel Right Left  Radial Palpable Palpable  PT 1+ Palpable 1+ Palpable  DP 1+ Palpable Not Palpable   Gastrointestinal: soft, non-tender/non-distended.  Musculoskeletal: M/S 5/5 throughout. Superficial ulceration on the top of the left third toe. Neurologic: Sensation grossly intact in extremities.  Symmetrical.  Speech is fluent. Motor exam as listed above. Psychiatric: Judgment intact, Mood & affect appropriate for pt's clinical situation. Dermatologic: ulcer on the toe as above     CBC Lab Results  Component Value Date   WBC 6.4 01/09/2011   HGB 13.0 01/09/2011   HCT 40.0 01/09/2011   MCV 97.6 01/09/2011   PLT 215 01/09/2011    BMET    Component Value Date/Time   NA 141 07/23/2017 1404   K 4.2 07/23/2017 1404   CL 106 07/23/2017 1404   CO2 28 07/23/2017 1404   GLUCOSE 196 (H) 07/23/2017 1404   BUN 58 (H)  07/23/2017 1404   CREATININE 2.05 (H) 07/23/2017 1404   CREATININE 1.70 (H) 06/26/2011 1028   CALCIUM 9.3 07/23/2017 1404   GFRNONAA 36 (L) 06/26/2011 1028   GFRAA 42 (L) 06/26/2011 1028   CrCl cannot be calculated (Patient's most recent lab result is older than the maximum 21 days allowed.).  COAG No results found for: INR, PROTIME  Radiology No results found.   Assessment/Plan  Assessment/Plan Diabetes mellitus with peripheral vascular disease (HCC) blood glucose control important in reducing the progression of atherosclerotic disease. Also, involved in wound healing. On appropriate medications.   Hypertension blood pressure control important in reducing the progression of atherosclerotic disease. On appropriate oral medications.   Hyperlipidemia with target LDL less than 70 lipid control important in reducing the progression of atherosclerotic disease. Continue statin therapy   Atherosclerosis of native arteries of the extremities with ulceration ([place]) His noninvasive studies earlier this year show noncompressible vessels bilaterally which is stable.  His digital pressures are also reasonably stable although somewhat reduced bilaterally.  They are 69 on the right and 51 on the left.  We have tried a couple of months of conservative management and local wound care but the ulcer has not healed.  At this point, angiography with revascularization would be planned.  Risks and benefits are discussed and he is agreeable to proceed.  This is clearly a critical and limb threatening situation.     Leotis Pain, MD  08/18/2017 10:47 AM

## 2017-08-18 NOTE — H&P (Signed)
[facility] History & Physical Update  The patient was interviewed and re-examined.  The patient's previous History and Physical has been reviewed and is unchanged.  There is no change in the plan of care. We plan to proceed with the scheduled procedure.  Leotis Pain, MD  08/18/2017, 10:46 AM

## 2017-08-18 NOTE — Telephone Encounter (Signed)
I called the patient back to let him know that he only had one appointment scheduled with [place] in August. I had to leave him a message.

## 2017-08-18 NOTE — Telephone Encounter (Signed)
-----   Message from Algernon Huxley, MD sent at 08/18/2017  2:29 PM EDT ----- He was made an appointment after procedure today for 8/16. He thinks he may already have an appointment for some time in August but not sure which day.  He needs one or the other but not both.

## 2017-08-18 NOTE — Discharge Instructions (Signed)

## 2017-08-18 NOTE — Anesthesia Preprocedure Evaluation (Deleted)
Anesthesia Evaluation  Patient identified by MRN, date of birth, ID band Patient awake    Reviewed: Allergy & Precautions, H&P , NPO status , Patient's Chart, lab work & pertinent test results, reviewed documented beta blocker date and time   Airway Mallampati: II  TM Distance: >3 FB Neck ROM: full    Dental  (+) Teeth Intact   Pulmonary neg pulmonary ROS, former smoker,    Pulmonary exam normal        Cardiovascular Exercise Tolerance: Poor hypertension, + CAD and + Peripheral Vascular Disease  negative cardio ROS Normal cardiovascular exam+ Valvular Problems/Murmurs  Rate:Normal     Neuro/Psych negative neurological ROS  negative psych ROS   GI/Hepatic negative GI ROS, Neg liver ROS,   Endo/Other  negative endocrine ROSdiabetes  Renal/GU CRFRenal diseasenegative Renal ROS  negative genitourinary   Musculoskeletal   Abdominal   Peds  Hematology negative hematology ROS (+)   Anesthesia Other Findings   Reproductive/Obstetrics negative OB ROS                             Anesthesia Physical Anesthesia Plan  ASA: II  Anesthesia Plan: General LMA   Post-op Pain Management:    Induction:   PONV Risk Score and Plan:   Airway Management Planned:   Additional Equipment:   Intra-op Plan:   Post-operative Plan:   Informed Consent: I have reviewed the patients History and Physical, chart, labs and discussed the procedure including the risks, benefits and alternatives for the proposed anesthesia with the patient or authorized representative who has indicated his/her understanding and acceptance.     Plan Discussed with: CRNA  Anesthesia Plan Comments:         Anesthesia Quick Evaluation

## 2017-08-18 NOTE — Op Note (Signed)
[facility]  Percutaneous Study/Intervention Procedural Note   Date of Surgery: 08/18/2017  Surgeon(s):Courtni Balash    Assistants:none  Pre-operative Diagnosis: PAD with ulceration left lower extremity  Post-operative diagnosis:  Same  Procedure(s) Performed:             1.  Ultrasound guidance for vascular access right femoral artery             2.  Catheter placement into left peroneal artery from right femoral approach             3.  Aortogram and selective left lower extremity angiogram             4.  Percutaneous transluminal angioplasty of left peroneal artery and tibioperoneal trunk with 3 mm diameter conventional and 4 mm diameter drug-coated angioplasty balloon             5.   Percutaneous transluminal angioplasty of the entire left SFA and popliteal arteries with 5 mm diameter angioplasty balloons including drug-coated angioplasty balloons proximally and distally  6.  Viabahn stent placement to the mid left SFA with 6 mm diameter by 15 cm length stent for greater than 50% residual stenosis in this location after angioplasty             7.  StarClose closure device right femoral artery  EBL: 10 cc  Contrast: 60 cc  Fluoro Time: 16.8 minutes  Moderate Conscious Sedation Time: approximately 60 minutes using 2 mg of Versed and 50 Mcg of Fentanyl              Indications:  Patient is a 84 y.o.male with nonhealing ulceration of the left foot. The patient has noninvasive study showing reduced perfusion of the left lower extremity with digital pressure of 51. The patient is brought in for angiography for further evaluation and potential treatment.  Due to the limb threatening nature of the situation, angiogram was performed for attempted limb salvage. The patient is aware that if the procedure fails, amputation would be expected.  The patient also understands that even with successful revascularization, amputation may still be required due to the severity of  the situation.  Risks and benefits are discussed and informed consent is obtained.   Procedure:  The patient was identified and appropriate procedural time out was performed.  The patient was then placed supine on the table and prepped and draped in the usual sterile fashion. Moderate conscious sedation was administered during a face to face encounter with the patient throughout the procedure with my supervision of the RN administering medicines and monitoring the patient's vital signs, pulse oximetry, telemetry and mental status throughout from the start of the procedure until the patient was taken to the recovery room. Ultrasound was used to evaluate the right common femoral artery.  It was patent .  A digital ultrasound image was acquired.  A Seldinger needle was used to access the right common femoral artery under direct ultrasound guidance and a permanent image was performed.  A 0.035 J wire was advanced without resistance and a 5Fr sheath was placed.  Pigtail catheter was placed into the aorta and an AP aortogram was performed.  Somewhat tedious due to his previous iliac stents and an advantage wire and Kumpe catheter were used initially to get into the aorta.  This demonstrated that the renal arteries appeared to be normal flow with a patent left renal stent.  Small abdominal aortic aneurysm.  The previously placed stents at the  aortic bifurcation and proximal common iliac arteries were patent although they did make crossing aortic bifurcation more difficult.  The left external iliac artery had about a 30% stenosis.  The right external iliac artery had no significant stenosis.  I then crossed the aortic bifurcation and advanced to the left femoral head.  The previous iliac stents made this crossing much more difficult, and a rim catheter and advantage wire were used.  After the rim catheter was advanced to the left femoral head selective left lower extremity angiogram was then performed. This demonstrated  near flush occlusion of the left SFA with reconstitution of the distal SFA although the popliteal artery was diseased with multiple calcific lesions creating greater than 50% stenosis.  There was then a short segment calcific lesion in the tibioperoneal trunk that created an 80 to 90% stenosis with the peroneal artery being the only runoff distally.  The anterior tibial artery and posterior tibial arteries were chronically occluded. The patient was systemically heparinized and a 6 [place] Ansell sheath was then placed over the [employer] wire. I then used a Kumpe catheter and the advantage wire to navigate through the SFA and popliteal disease and confirm intraluminal flow in the below-knee popliteal artery.  Due to the calcific nature of the disease, had to exchange for a CXI catheter and remove the Kumpe catheter to get down in the popliteal artery.  I then exchanged for a 0.018 wire and remove the diagnostic catheters.  I began by using a 3 mm diameter by 30 cm length angioplasty balloon.  This was used to treat the proximal peroneal artery and tibioperoneal trunk as well as to predilate the SFA and popliteal lesions.  This was inflated to 12 atm for 1 minute.  I then used a 5 mm diameter by 22 cm length Lutonix drug-coated angioplasty balloon.  This was started in the distal popliteal artery and brought up to the proximal popliteal artery and inflated to 10 atm for 1 minute.  This balloon was then pulled back to the mid and distal SFA and inflated to 14 atm for a minute.  For the proximal SFA, a 5 mm diameter by 22 cm length new Lutonix drug-coated angioplasty balloon was used and inflated to 14 atm for 1 minute.  Completion angiogram showed greater than 50% residual stenosis in the tibioperoneal trunk and I elected to upsized to a 4 mm diameter by 6 cm length Lutonix drug-coated angioplasty balloon.  This was inflated to 8 atm for 1 minute.  Completion imaging following this showed only about a 20%  residual stenosis with preserved flow through the peroneal artery distally.  The SFA proximally and the popliteal artery had less than 20% residual stenosis after angioplasty, but the mid and distal SFA had a high-grade residual stenosis and irregularity but that I elected to place a stent 4.  A 6 mm diameter by 15 cm length Viabahn stent was deployed in the mid to distal SFA and postdilated with a 5 mm high-pressure balloon less than 10% residual stenosis.  I elected to terminate the procedure. The sheath was removed and StarClose closure device was deployed in the right femoral artery with excellent hemostatic result. The patient was taken to the recovery room in stable condition having tolerated the procedure well.  Findings:               Aortogram:  The renal arteries appeared to be normal flow with a patent left renal stent.  Small abdominal aortic  aneurysm.  The previously placed stents at the aortic bifurcation and proximal common iliac arteries were patent although they did make crossing aortic bifurcation more difficult.  The left external iliac artery had about a 30% stenosis.  The right external iliac artery had no significant stenosis.             Left lower Extremity:  This demonstrated near flush occlusion of the left SFA with reconstitution of the distal SFA although the popliteal artery was diseased with multiple calcific lesions creating greater than 50% stenosis.  There was then a short segment calcific lesion in the tibioperoneal trunk that created an 80 to 90% stenosis with the peroneal artery being the only runoff distally.  The anterior tibial artery and posterior tibial arteries were chronically occluded.   Disposition: Patient was taken to the recovery room in stable condition having tolerated the procedure well.  Complications: None  Leotis Pain 08/18/2017 1:58 PM   This note was created with Dragon Medical transcription system. Any errors in dictation are purely  unintentional.

## 2017-08-19 ENCOUNTER — Encounter: Payer: Self-pay | Admitting: Vascular Surgery

## 2017-08-20 ENCOUNTER — Encounter: Payer: Self-pay | Admitting: Internal Medicine

## 2017-08-20 ENCOUNTER — Other Ambulatory Visit: Payer: Self-pay | Admitting: Internal Medicine

## 2017-08-21 ENCOUNTER — Other Ambulatory Visit: Payer: Self-pay | Admitting: Internal Medicine

## 2017-08-21 MED ORDER — GABAPENTIN 300 MG PO CAPS: 600.0000 mg | ORAL_CAPSULE | Freq: Three times a day (TID) | ORAL | 0 refills | Status: DC

## 2017-08-27 ENCOUNTER — Other Ambulatory Visit: Payer: Self-pay | Admitting: Internal Medicine

## 2017-08-27 ENCOUNTER — Encounter: Payer: Self-pay | Admitting: Internal Medicine

## 2017-08-27 MED ORDER — TRAMADOL HCL 50 MG PO TABS: 50.0000 mg | ORAL_TABLET | Freq: Four times a day (QID) | ORAL | 0 refills | Status: DC | PRN

## 2017-08-27 NOTE — Progress Notes (Signed)
Trial of tramadol

## 2017-08-27 NOTE — Telephone Encounter (Signed)
Spoke with pt and informed him that Dr. Derrel Nip has sent in a week supply of Tramadol for him to try taking to see if helps with his neuropathy pain. Also explained to pt that is needs to continue taking his gabapentin dose as well. Pt gave a verbal understanding and stated that he will give [place] a call to let us know if the medication helps or not.

## 2017-09-01 ENCOUNTER — Encounter: Payer: Self-pay | Admitting: Internal Medicine

## 2017-09-03 ENCOUNTER — Ambulatory Visit (INDEPENDENT_AMBULATORY_CARE_PROVIDER_SITE_OTHER): Payer: Medicare Other | Admitting: Internal Medicine

## 2017-09-03 ENCOUNTER — Encounter: Payer: Self-pay | Admitting: Internal Medicine

## 2017-09-03 VITALS — BP 126/70 | HR 83 | Temp 97.9°F | Resp 15 | Ht 69.0 in | Wt 177.2 lb

## 2017-09-03 DIAGNOSIS — M79671 Pain in right foot: Secondary | ICD-10-CM

## 2017-09-03 DIAGNOSIS — R300 Dysuria: Secondary | ICD-10-CM

## 2017-09-03 DIAGNOSIS — R443 Hallucinations, unspecified: Secondary | ICD-10-CM

## 2017-09-03 DIAGNOSIS — M79672 Pain in left foot: Secondary | ICD-10-CM

## 2017-09-03 DIAGNOSIS — M79604 Pain in right leg: Secondary | ICD-10-CM | POA: Diagnosis not present

## 2017-09-03 DIAGNOSIS — M79605 Pain in left leg: Secondary | ICD-10-CM

## 2017-09-03 DIAGNOSIS — I7025 Atherosclerosis of native arteries of other extremities with ulceration: Secondary | ICD-10-CM | POA: Diagnosis not present

## 2017-09-03 LAB — POCT URINALYSIS DIPSTICK
Bilirubin, UA: NEGATIVE
Blood, UA: NEGATIVE
GLUCOSE UA: NEGATIVE
KETONES UA: NEGATIVE
LEUKOCYTES UA: NEGATIVE
NITRITE UA: NEGATIVE
Protein, UA: NEGATIVE
SPEC GRAV UA: 1.02 (ref 1.010–1.025)
Urobilinogen, UA: 0.2 E.U./dL
pH, UA: 5.5 (ref 5.0–8.0)

## 2017-09-03 MED ORDER — HYDROCODONE-ACETAMINOPHEN 10-325 MG PO TABS: 1.0000 | ORAL_TABLET | Freq: Four times a day (QID) | ORAL | 0 refills | Status: DC | PRN

## 2017-09-03 NOTE — Progress Notes (Signed)
Subjective:  Patient ID: [name], male    DOB: 04-05-1924  Age: 84 y.o. MRN: [id number]  CC: The primary encounter diagnosis was Dysuria. Diagnoses of Hallucinations and Bilateral pain of leg and foot were also pertinent to this visit.  HPI [name] presents for  Evaluation and management of worsening  pain in both legs.    The pain has been gradually worse for the last several weeks.  He has PAD that had progressed and was treated recently with angioplasty and stent (mid July) when  His  diabetic foot ulcer on the left third toe failed to improved with management by podiatrist Troxler.  He was referred to vascular,   saw Dew,  ABIS were worse.  Had a stent placed on July 15th by  [facility]   His leg pain is described as stabbing and excruciating at times.  He has increased his gabapentin several times with no sifgnficiant relief.  The pain is episodic but persistent and  has become so severe that he at times he is  unable to sleep and he describes it as coming in waves.  He was prescribed  Tramadol,  Which  helped the pain,  But he has had several serious adverse side effects::   Visual  hallucinations on 2 occasionss,  Excessive sedation at night,  sleeping through his alarm  and phone calls,  He has also had myoclonic jerks that cause  Him to bite his tongue  He has  tried Lyrica in the distant past but the medication did not work bc the issue was circulation ,   Has a follow up with Dr Lucky Cowboy  August 16  For ABIS Outpatient Medications Prior to Visit  Medication Sig Dispense Refill  . amLODipine (NORVASC) 10 MG tablet TAKE 1 TABLET (10 MG TOTAL) BY MOUTH DAILY. 90 tablet 2  . aspirin 325 MG tablet Take 325 mg by mouth daily.      Marland Kitchen atorvastatin (LIPITOR) 20 MG tablet TAKE 1 TABLET (20 MG TOTAL) BY MOUTH DAILY. 90 tablet 1  . Calcium Carbonate-Vitamin D (CALCIUM + D) 600-200 MG-UNIT TABS Take 1 tablet by mouth 2 (two) times daily.      . clopidogrel (PLAVIX) 75  MG tablet TAKE 1 TABLET (75 MG TOTAL) BY MOUTH DAILY. 90 tablet 1  . cyanocobalamin (,VITAMIN B-12,) 1000 MCG/ML injection INJECT 1 ML (1,000 MCG TOTAL) INTO THE MUSCLE EVERY 30 (THIRTY) DAYS. 10 mL 0  . fenofibrate (TRICOR) 48 MG tablet TAKE 1 TABLET BY MOUTH DAILY 90 tablet 1  . furosemide (LASIX) 20 MG tablet Take 2 tablets (40 mg total) by mouth daily. AS NEEDED FOR FLUID RETENTION 180 tablet 1  . gabapentin (NEURONTIN) 300 MG capsule Take 2 capsules (600 mg total) by mouth 3 (three) times daily. 180 capsule 0  . glucose blood (ACCU-CHEK AVIVA PLUS) test strip CHECK BLOOD SUGAR 3 TIMES A DAY AS NEEDED DX E11.51 300 each 5  . hydrocortisone 2.5 % cream Apply 1 application topically 2 (two) times daily as needed.    . insulin aspart protamine - aspart (NOVOLOG MIX 70/30 FLEXPEN) (70-30) 100 UNIT/ML FlexPen INJECT 40 units two times daily , adjust dose using sliding scale 75 pen 1  . Insulin Pen Needle (B-D ULTRAFINE III SHORT PEN) 31G X 8 MM MISC USE AS DIRECTED 3 TIMES A DAY 300 each 5  . Insulin Syringe-Needle U-100 (B-D INS SYRINGE 0.5CC/31GX5/16) 31G X 5/16" 0.5 ML MISC 1 application by  Does not apply route 3 (three) times daily before meals. 300 each 3  . latanoprost (XALATAN) 0.005 % ophthalmic solution Place 1 drop into both eyes at bedtime.  3  . losartan (COZAAR) 100 MG tablet TAKE 1 TABLET BY MOUTH EVERY DAY 90 tablet 1  . metoprolol tartrate (LOPRESSOR) 100 MG tablet TAKE 1 TABLET BY MOUTH TWICE A DAY 180 tablet 1  . Multiple Vitamin (MULTIVITAMIN) tablet Take 1 tablet by mouth daily.    . Syringe, Disposable, 3 ML MISC For use with B12 injections weekly/monthly 25 each 0  . SYRINGE-NEEDLE, DISP, 3 ML (B-D 3CC LUER-LOK SYR 25GX1") 25G X 1" 3 ML MISC FOR USE WITH B12 INJECTIONS WEEKLY/MONTHLY 50 each 1  . traMADol (ULTRAM) 50 MG tablet Take 1 tablet (50 mg total) by mouth every 6 (six) hours as needed. 30 tablet 0  . gabapentin (NEURONTIN) 300 MG capsule TAKE 1 CAPSULE BY MOUTH THREE  TIMES A DAY (Patient not taking: Reported on 09/03/2017) 270 capsule 0   No facility-administered medications prior to visit.     Review of Systems;  Patient denies headache, fevers, malaise, unintentional weight loss, skin rash, eye pain, sinus congestion and sinus pain, sore throat, dysphagia,  hemoptysis , cough, dyspnea, wheezing, chest pain, palpitations, orthopnea, edema, abdominal pain, nausea, melena, diarrhea, constipation, flank pain, dysuria, hematuria, urinary  Frequency, nocturia, numbness, tingling, seizures,  Focal weakness, Loss of consciousness,  Tremor, insomnia, depression, anxiety, and suicidal ideation.      Objective:  BP 126/70 (BP Location: Left Arm, Patient Position: Sitting, Cuff Size: Normal)   Pulse 83   Temp 97.9 F (36.6 C) (Oral)   Resp 15   Ht 5\' 9"  (1.753 m)   Wt 177 lb 3.2 oz (80.4 kg)   SpO2 97%   BMI 26.17 kg/m   BP Readings from Last 3 Encounters:  09/03/17 126/70  08/18/17 (!) 137/56  07/10/17 138/80    Wt Readings from Last 3 Encounters:  09/03/17 177 lb 3.2 oz (80.4 kg)  08/18/17 171 lb (77.6 kg)  07/23/17 184 lb 6.4 oz (83.6 kg)    General appearance: alert, cooperative and appears stated age Ears: normal TM's and external ear canals both ears Throat: lips, mucosa, and tongue normal; teeth and gums normal Neck: no adenopathy, no carotid bruit, supple, symmetrical, trachea midline and thyroid not enlarged, symmetric, no tenderness/mass/nodules Back: symmetric, no curvature. ROM normal. No CVA tenderness. Lungs: clear to auscultation bilaterally Heart: regular rate and rhythm, S1, S2 normal, no murmur, click, rub or gallop Abdomen: soft, non-tender; bowel sounds normal; no masses,  no organomegaly Pulses: 2+ and symmetric Skin: left toes purple,  3rd toe with  Ischemic ulcer.  Feet warm without palpable pulses  Lymph nodes: Cervical, supraclavicular, and axillary nodes normal.  Lab Results  Component Value Date   HGBA1C 6.8 (H)  07/09/2017   HGBA1C 7.5 (H) 04/07/2017   HGBA1C 7.1 (H) 10/09/2016    Lab Results  Component Value Date   CREATININE 2.07 (H) 08/18/2017   CREATININE 2.05 (H) 07/23/2017   CREATININE 1.58 (H) 07/09/2017    Lab Results  Component Value Date   WBC 6.4 01/09/2011   HGB 13.0 01/09/2011   HCT 40.0 01/09/2011   PLT 215 01/09/2011   GLUCOSE 196 (H) 07/23/2017   CHOL 102 04/07/2017   TRIG 119.0 04/07/2017   HDL 34.00 (L) 04/07/2017   LDLDIRECT 49.0 06/26/2015   LDLCALC 44 04/07/2017   ALT 12 07/09/2017   AST 13 07/09/2017  NA 141 07/23/2017   K 4.2 07/23/2017   CL 106 07/23/2017   CREATININE 2.07 (H) 08/18/2017   BUN 48 (H) 08/18/2017   CO2 28 07/23/2017   TSH 1.49 06/13/2014   HGBA1C 6.8 (H) 07/09/2017   MICROALBUR 3.1 (H) 04/07/2017    No results found.  Assessment & Plan:   Problem List Items Addressed This Visit    Bilateral pain of leg and foot    Unclear if his escalating pain is due to neuropathy that is worsening or recurrent critical stenosis from recent stenting of artery in left leg.  Patient has an appointment with cardiology (Paraschos) today,  Advised to have ABI's done in office rather than wait until August 16 (follow up with Dr Lucky Cowboy) . Vicodin prescribed #30 given history of hallucinations and myoclonus with tramadol trial        Other Visit Diagnoses    Dysuria    -  Primary   Relevant Orders   POCT Urinalysis Dipstick (Completed)   Urine Culture   Urine Microscopic Only   Hallucinations          I am having Yevonne Aline "Burney Gauze" start on HYDROcodone-acetaminophen. I am also having him maintain his aspirin, Calcium Carbonate-Vitamin D, hydrocortisone, latanoprost, cyanocobalamin, Syringe (Disposable), Insulin Syringe-Needle U-100, SYRINGE-NEEDLE (DISP) 3 ML, Insulin Pen Needle, insulin aspart protamine - aspart, losartan, metoprolol tartrate, fenofibrate, clopidogrel, atorvastatin, furosemide, amLODipine, glucose blood, multivitamin,  gabapentin, and traMADol.  Meds ordered this encounter  Medications  . HYDROcodone-acetaminophen (NORCO) 10-325 MG tablet    Sig: Take 1 tablet by mouth every 6 (six) hours as needed.    Dispense:  30 tablet    Refill:  0    Medications Discontinued During This Encounter  Medication Reason  . gabapentin (NEURONTIN) 300 MG capsule Change in therapy    Follow-up: Return in about 1 week (around 09/10/2017).   Crecencio Mc, MD

## 2017-09-03 NOTE — Patient Instructions (Addendum)
I am concerned that the severe pain in your feet is due to ongoing circulation problems in your feet, including an "embolic shower"  That can happen after a stent is placed   Please tell this to Dr Saralyn Pilar and have him check an ultrasound on your feet circulation   Stop the tramadol   I am giving you vicodin to use instead .[name]  It may constipate you!

## 2017-09-03 NOTE — Telephone Encounter (Signed)
Copied from [place] [id number]. Topic: Quick Communication - See Telephone Encounter >> Sep 03, 2017  1:18 PM [name] A wrote: CRM for notification. See Telephone encounter for: 09/03/17. Daughter [name] or [name]) called in and would like to ask Dr Derrel Nip about pt driving while taking the traMADol (ULTRAM) 50 MG tablet [[id number] and mixing it with all his meds.  She wants to know if this is safe?    Best number -[place] [phone]

## 2017-09-04 DIAGNOSIS — R011 Cardiac murmur, unspecified: Secondary | ICD-10-CM | POA: Diagnosis not present

## 2017-09-04 DIAGNOSIS — E7849 Other hyperlipidemia: Secondary | ICD-10-CM | POA: Diagnosis not present

## 2017-09-04 DIAGNOSIS — M79605 Pain in left leg: Secondary | ICD-10-CM

## 2017-09-04 DIAGNOSIS — I5031 Acute diastolic (congestive) heart failure: Secondary | ICD-10-CM | POA: Diagnosis not present

## 2017-09-04 DIAGNOSIS — Z951 Presence of aortocoronary bypass graft: Secondary | ICD-10-CM | POA: Diagnosis not present

## 2017-09-04 DIAGNOSIS — I10 Essential (primary) hypertension: Secondary | ICD-10-CM | POA: Diagnosis not present

## 2017-09-04 DIAGNOSIS — G8929 Other chronic pain: Secondary | ICD-10-CM | POA: Insufficient documentation

## 2017-09-04 DIAGNOSIS — I350 Nonrheumatic aortic (valve) stenosis: Secondary | ICD-10-CM | POA: Diagnosis not present

## 2017-09-04 DIAGNOSIS — M79604 Pain in right leg: Secondary | ICD-10-CM

## 2017-09-04 DIAGNOSIS — I739 Peripheral vascular disease, unspecified: Secondary | ICD-10-CM | POA: Diagnosis not present

## 2017-09-04 DIAGNOSIS — R600 Localized edema: Secondary | ICD-10-CM | POA: Diagnosis not present

## 2017-09-04 DIAGNOSIS — I701 Atherosclerosis of renal artery: Secondary | ICD-10-CM | POA: Diagnosis not present

## 2017-09-04 DIAGNOSIS — I2510 Atherosclerotic heart disease of native coronary artery without angina pectoris: Secondary | ICD-10-CM | POA: Diagnosis not present

## 2017-09-04 LAB — URINALYSIS, MICROSCOPIC ONLY: RBC / HPF: NONE SEEN (ref 0–?)

## 2017-09-04 LAB — URINE CULTURE
MICRO NUMBER:: 90905532
RESULT: NO GROWTH
SPECIMEN QUALITY:: ADEQUATE

## 2017-09-04 NOTE — Assessment & Plan Note (Signed)
Unclear if his escalating pain is due to neuropathy that is worsening or recurrent critical stenosis from recent stenting of artery in left leg.  Patient has an appointment with cardiology (Paraschos) today,  Advised to have ABI's done in office rather than wait until August 16 (follow up with Dr Lucky Cowboy) . Vicodin prescribed #30 given history of hallucinations and myoclonus with tramadol trial

## 2017-09-05 NOTE — Telephone Encounter (Signed)
Attempted to call Elizabeth(on DPR), pt's daughter and she was not available so called pt's son(on DPR) spoke with him to let him know that it is not advised that the pt drive while taking Tramadol. Got to looking at the pt's last office note and realized that the pt was to stop taking the tramadol and take start taking vicodin instead since he had hallucinations with the Tramadol. I then read what Dr. Derrel Nip had typed on the after visit summary and explained it to the son. The son took notes and stated that he would follow up with pt but would like for [place] to follow up with pt as well. Daughter called back and the same information was given to the daughter and she asked that we wait to call the pt later this afternoon because he doesn't like to get up until late morning. Told daughter we would reach out to pt after lunch.

## 2017-09-06 DIAGNOSIS — Z961 Presence of intraocular lens: Secondary | ICD-10-CM | POA: Diagnosis not present

## 2017-09-06 DIAGNOSIS — H40013 Open angle with borderline findings, low risk, bilateral: Secondary | ICD-10-CM | POA: Diagnosis not present

## 2017-09-10 ENCOUNTER — Ambulatory Visit (INDEPENDENT_AMBULATORY_CARE_PROVIDER_SITE_OTHER): Payer: Medicare Other | Admitting: Internal Medicine

## 2017-09-10 ENCOUNTER — Encounter: Payer: Self-pay | Admitting: Internal Medicine

## 2017-09-10 VITALS — BP 132/70 | HR 81 | Temp 98.3°F | Resp 16 | Ht 69.0 in | Wt 177.2 lb

## 2017-09-10 DIAGNOSIS — E1141 Type 2 diabetes mellitus with diabetic mononeuropathy: Secondary | ICD-10-CM | POA: Diagnosis not present

## 2017-09-10 DIAGNOSIS — N183 Chronic kidney disease, stage 3 unspecified: Secondary | ICD-10-CM

## 2017-09-10 DIAGNOSIS — I7025 Atherosclerosis of native arteries of other extremities with ulceration: Secondary | ICD-10-CM

## 2017-09-10 LAB — BASIC METABOLIC PANEL
BUN: 51 mg/dL — AB (ref 6–23)
CHLORIDE: 105 meq/L (ref 96–112)
CO2: 31 meq/L (ref 19–32)
Calcium: 9.8 mg/dL (ref 8.4–10.5)
Creatinine, Ser: 2.01 mg/dL — ABNORMAL HIGH (ref 0.40–1.50)
GFR: 33.26 mL/min — ABNORMAL LOW (ref >60.00)
Glucose, Bld: 154 mg/dL — ABNORMAL HIGH (ref 70–99)
POTASSIUM: 4.4 meq/L (ref 3.5–5.1)
Sodium: 144 mEq/L (ref 135–145)

## 2017-09-10 MED ORDER — TRAMADOL HCL 50 MG PO TABS: 50.0000 mg | ORAL_TABLET | Freq: Every evening | ORAL | 0 refills | Status: DC | PRN

## 2017-09-10 MED ORDER — PREGABALIN 25 MG PO CAPS: 50.0000 mg | ORAL_CAPSULE | Freq: Three times a day (TID) | ORAL | 1 refills | Status: DC

## 2017-09-10 NOTE — Progress Notes (Signed)
Subjective:  Patient ID: [name], male    DOB: 08/15/1924  Age: 84 y.o. MRN: [id number]  CC: The primary encounter diagnosis was CKD (chronic kidney disease) stage 3, GFR 30-59 ml/min ([place]). A diagnosis of Diabetic mononeuropathy associated with type 2 diabetes mellitus ([place]) was also pertinent to this visit.  HPI [name] presents for one week follow up on bilateral leg pain attributed to worsening neuropathy,  In the setting of recent angiography , angioplasty and stenting of  His left peroneaL ARTERY   And plasty and DES to mid left SFA    Patient was advised to discuss his leg pain with his cardioloigist Dr Saralyn Pilar since he had a visit the following day> he states that he did but review of Dr Anitra Lauth notes from [place] July 24 showed no evidence that  this was discussed.  Patient states that  Dr Saralyn Pilar deferred to Dr Lucky Cowboy , whom he sees on  Sept 16th  patient notes that the Vicodin relieved his pain ,  But he did not tolerate Vicodin due to hallucinations,  Delirium,  Vision changes.  .  (did not tolerate tramadol for same reason)   Has been using advil twice daily due to uncontrolled pain despite his known CKD>  Outpatient Medications Prior to Visit  Medication Sig Dispense Refill  . amLODipine (NORVASC) 10 MG tablet TAKE 1 TABLET (10 MG TOTAL) BY MOUTH DAILY. 90 tablet 2  . aspirin 325 MG tablet Take 325 mg by mouth daily.      Marland Kitchen atorvastatin (LIPITOR) 20 MG tablet TAKE 1 TABLET (20 MG TOTAL) BY MOUTH DAILY. 90 tablet 1  . Calcium Carbonate-Vitamin D (CALCIUM + D) 600-200 MG-UNIT TABS Take 1 tablet by mouth 2 (two) times daily.      . clopidogrel (PLAVIX) 75 MG tablet TAKE 1 TABLET (75 MG TOTAL) BY MOUTH DAILY. 90 tablet 1  . cyanocobalamin (,VITAMIN B-12,) 1000 MCG/ML injection INJECT 1 ML (1,000 MCG TOTAL) INTO THE MUSCLE EVERY 30 (THIRTY) DAYS. 10 mL 0  . fenofibrate (TRICOR) 48 MG tablet TAKE 1 TABLET BY MOUTH DAILY 90 tablet 1  . furosemide (LASIX) 20 MG tablet  Take 2 tablets (40 mg total) by mouth daily. AS NEEDED FOR FLUID RETENTION 180 tablet 1  . gabapentin (NEURONTIN) 300 MG capsule Take 2 capsules (600 mg total) by mouth 3 (three) times daily. 180 capsule 0  . glucose blood (ACCU-CHEK AVIVA PLUS) test strip CHECK BLOOD SUGAR 3 TIMES A DAY AS NEEDED DX E11.51 300 each 5  . hydrocortisone 2.5 % cream Apply 1 application topically 2 (two) times daily as needed.    . insulin aspart protamine - aspart (NOVOLOG MIX 70/30 FLEXPEN) (70-30) 100 UNIT/ML FlexPen INJECT 40 units two times daily , adjust dose using sliding scale 75 pen 1  . Insulin Pen Needle (B-D ULTRAFINE III SHORT PEN) 31G X 8 MM MISC USE AS DIRECTED 3 TIMES A DAY 300 each 5  . Insulin Syringe-Needle U-100 (B-D INS SYRINGE 0.5CC/31GX5/16) 31G X 5/16" 0.5 ML MISC 1 application by Does not apply route 3 (three) times daily before meals. 300 each 3  . latanoprost (XALATAN) 0.005 % ophthalmic solution Place 1 drop into both eyes at bedtime.  3  . losartan (COZAAR) 100 MG tablet TAKE 1 TABLET BY MOUTH EVERY DAY 90 tablet 1  . metoprolol tartrate (LOPRESSOR) 100 MG tablet TAKE 1 TABLET BY MOUTH TWICE A DAY 180 tablet 1  . Multiple Vitamin (MULTIVITAMIN) tablet  Take 1 tablet by mouth daily.    . Syringe, Disposable, 3 ML MISC For use with B12 injections weekly/monthly 25 each 0  . SYRINGE-NEEDLE, DISP, 3 ML (B-D 3CC LUER-LOK SYR 25GX1") 25G X 1" 3 ML MISC FOR USE WITH B12 INJECTIONS WEEKLY/MONTHLY 50 each 1  . HYDROcodone-acetaminophen (NORCO) 10-325 MG tablet Take 1 tablet by mouth every 6 (six) hours as needed. (Patient not taking: Reported on 09/10/2017) 30 tablet 0  . traMADol (ULTRAM) 50 MG tablet Take 1 tablet (50 mg total) by mouth every 6 (six) hours as needed. (Patient not taking: Reported on 09/10/2017) 30 tablet 0   No facility-administered medications prior to visit.     Review of Systems;  Patient denies headache, fevers, malaise, unintentional weight loss, skin rash, eye pain, sinus  congestion and sinus pain, sore throat, dysphagia,  hemoptysis , cough, dyspnea, wheezing, chest pain, palpitations, orthopnea, edema, abdominal pain, nausea, melena, diarrhea, constipation, flank pain, dysuria, hematuria, urinary  Frequency, nocturia, numbness, tingling, seizures,  Focal weakness, Loss of consciousness,  Tremor, insomnia, depression, anxiety, and suicidal ideation.      Objective:  BP 132/70 (BP Location: Left Arm, Patient Position: Sitting, Cuff Size: Normal)   Pulse 81   Temp 98.3 F (36.8 C) (Oral)   Resp 16   Ht 5\' 9"  (1.753 m)   Wt 177 lb 3.2 oz (80.4 kg)   SpO2 98%   BMI 26.17 kg/m   BP Readings from Last 3 Encounters:  09/10/17 132/70  09/03/17 126/70  08/18/17 (!) 137/56    Wt Readings from Last 3 Encounters:  09/10/17 177 lb 3.2 oz (80.4 kg)  09/03/17 177 lb 3.2 oz (80.4 kg)  08/18/17 171 lb (77.6 kg)    General appearance: alert, cooperative and appears stated age Lungs: clear to auscultation bilaterally Heart: regular rate and rhythm, S1, S2 normal, no murmur, click, rub or gallop Abdomen: soft, non-tender; bowel sounds normal; no masses,  no organomegaly Pulses: feeble DP and TA pulses bilaterally,   EXt: Toes with brisk cap refill , warm  Skin: Skin color, texture, turgor normal. No rashes or lesions Lymph nodes: Cervical, supraclavicular, and axillary nodes normal.  Lab Results  Component Value Date   HGBA1C 6.8 (H) 07/09/2017   HGBA1C 7.5 (H) 04/07/2017   HGBA1C 7.1 (H) 10/09/2016    Lab Results  Component Value Date   CREATININE 2.01 (H) 09/10/2017   CREATININE 2.07 (H) 08/18/2017   CREATININE 2.05 (H) 07/23/2017    Lab Results  Component Value Date   WBC 6.4 01/09/2011   HGB 13.0 01/09/2011   HCT 40.0 01/09/2011   PLT 215 01/09/2011   GLUCOSE 154 (H) 09/10/2017   CHOL 102 04/07/2017   TRIG 119.0 04/07/2017   HDL 34.00 (L) 04/07/2017   LDLDIRECT 49.0 06/26/2015   LDLCALC 44 04/07/2017   ALT 12 07/09/2017   AST 13  07/09/2017   NA 144 09/10/2017   K 4.4 09/10/2017   CL 105 09/10/2017   CREATININE 2.01 (H) 09/10/2017   BUN 51 (H) 09/10/2017   CO2 31 09/10/2017   TSH 1.49 06/13/2014   HGBA1C 6.8 (H) 07/09/2017   MICROALBUR 3.1 (H) 04/07/2017    No results found.  Assessment & Plan:   Problem List Items Addressed This Visit    Diabetic neuropathy associated with type 2 diabetes mellitus ([place])    Trial of Lyrica, starting at 25 mg tid. Advised to increase dose gradually        CKD (chronic  kidney disease) stage 3, GFR 30-59 ml/min ([facility]) - Primary   Relevant Orders   Basic metabolic panel (Completed)      I have discontinued [name] Aline "Joas Badders"'s HYDROcodone-acetaminophen. I have also changed his traMADol. Additionally, I am having him start on pregabalin. Lastly, I am having him maintain his aspirin, Calcium Carbonate-Vitamin D, hydrocortisone, latanoprost, cyanocobalamin, Syringe (Disposable), Insulin Syringe-Needle U-100, SYRINGE-NEEDLE (DISP) 3 ML, Insulin Pen Needle, insulin aspart protamine - aspart, losartan, metoprolol tartrate, fenofibrate, clopidogrel, atorvastatin, furosemide, amLODipine, glucose blood, multivitamin, and gabapentin.  Meds ordered this encounter  Medications  . pregabalin (LYRICA) 25 MG capsule    Sig: Take 2 capsules (50 mg total) by mouth 3 (three) times daily.    Dispense:  90 capsule    Refill:  1  . traMADol (ULTRAM) 50 MG tablet    Sig: Take 1 tablet (50 mg total) by mouth at bedtime as needed.    Dispense:  30 tablet    Refill:  0    Medications Discontinued During This Encounter  Medication Reason  . HYDROcodone-acetaminophen (NORCO) 10-325 MG tablet Patient Preference  . traMADol (ULTRAM) 50 MG tablet Patient Preference    Follow-up: Return in about 1 week (around 09/17/2017).   Crecencio Mc, MD

## 2017-09-10 NOTE — Patient Instructions (Addendum)
Start the lyrica at 25 mg every 8 hours  For the neuropathy   Increase after 2 days to 50 mg every 8 hours if needed  Increase  After 2 days to 75 mg every 8 hours as needed   Continue using up to 2000 mg tylenol daily  You canTry 1/2 tramadol if needed at bedtime   I do think acupuncture is worth a try   Dr Francisca December is a Curator  Who practices acupuncture

## 2017-09-11 NOTE — Assessment & Plan Note (Addendum)
Trial of Lyrica, starting at 25 mg tid. Advised to increase dose gradually

## 2017-09-18 ENCOUNTER — Other Ambulatory Visit (INDEPENDENT_AMBULATORY_CARE_PROVIDER_SITE_OTHER): Payer: Self-pay | Admitting: Vascular Surgery

## 2017-09-18 ENCOUNTER — Other Ambulatory Visit: Payer: Self-pay | Admitting: Internal Medicine

## 2017-09-18 DIAGNOSIS — I739 Peripheral vascular disease, unspecified: Secondary | ICD-10-CM

## 2017-09-19 ENCOUNTER — Encounter (INDEPENDENT_AMBULATORY_CARE_PROVIDER_SITE_OTHER): Payer: Self-pay | Admitting: *Deleted

## 2017-09-19 ENCOUNTER — Ambulatory Visit (INDEPENDENT_AMBULATORY_CARE_PROVIDER_SITE_OTHER): Payer: Medicare Other

## 2017-09-19 ENCOUNTER — Ambulatory Visit (INDEPENDENT_AMBULATORY_CARE_PROVIDER_SITE_OTHER): Payer: Medicare Other | Admitting: Nurse Practitioner

## 2017-09-19 ENCOUNTER — Other Ambulatory Visit: Payer: Self-pay | Admitting: Internal Medicine

## 2017-09-19 VITALS — BP 120/66 | HR 75 | Resp 16 | Ht 69.0 in | Wt 177.0 lb

## 2017-09-19 DIAGNOSIS — E1122 Type 2 diabetes mellitus with diabetic chronic kidney disease: Secondary | ICD-10-CM | POA: Diagnosis not present

## 2017-09-19 DIAGNOSIS — Z794 Long term (current) use of insulin: Secondary | ICD-10-CM

## 2017-09-19 DIAGNOSIS — I739 Peripheral vascular disease, unspecified: Secondary | ICD-10-CM

## 2017-09-19 DIAGNOSIS — N183 Chronic kidney disease, stage 3 unspecified: Secondary | ICD-10-CM

## 2017-09-19 DIAGNOSIS — I7025 Atherosclerosis of native arteries of other extremities with ulceration: Secondary | ICD-10-CM

## 2017-09-19 DIAGNOSIS — I10 Essential (primary) hypertension: Secondary | ICD-10-CM

## 2017-09-19 DIAGNOSIS — E1141 Type 2 diabetes mellitus with diabetic mononeuropathy: Secondary | ICD-10-CM

## 2017-09-19 MED ORDER — COLLAGENASE 250 UNIT/GM EX OINT: TOPICAL_OINTMENT | CUTANEOUS | 1 refills | Status: AC

## 2017-09-19 MED ORDER — PREGABALIN 50 MG PO CAPS: 50.0000 mg | ORAL_CAPSULE | Freq: Three times a day (TID) | ORAL | 4 refills | Status: DC

## 2017-09-21 ENCOUNTER — Encounter (INDEPENDENT_AMBULATORY_CARE_PROVIDER_SITE_OTHER): Payer: Self-pay | Admitting: Nurse Practitioner

## 2017-09-21 NOTE — Progress Notes (Signed)
Subjective:    Patient ID: [name], male    DOB: [birth year], 84 y.o.   MRN: [id number] Chief Complaint  Patient presents with  . Follow-up    [facility] 4wk with ultrasound    HPI   The patient returns to the office for followup and review status post angiogram with intervention. The patient notes improvement in the lower extremity symptoms. No interval shortening of the patient's claudication distance or rest pain symptoms. Previous wounds have now healed.  No new ulcers or wounds have occurred since the last visit.  There have been no significant changes to the patient's overall health care.  The patient denies amaurosis fugax or recent TIA symptoms. There are no recent neurological changes noted. The patient denies history of DVT, PE or superficial thrombophlebitis. The patient denies recent episodes of angina or shortness of breath.   Duplex US of the left lower extremity arterial system shows patent stent with no evidence of stenosis in the mid SFA to TP trunk artery biphasic flow and patency throughout the common femoral artery, SFA, popliteal, and lower extremity arteries.  Digital waveforms were also done on all digits of his lower extremities, which showed waveforms present on all digits, however dampened.  Constitutional: [] Weight loss  [] Fever  [] Chills Cardiac: [] Chest pain   [] Chest pressure   [] Palpitations   [] Shortness of breath when laying flat   [] Shortness of breath with exertion. Vascular:  [x] Pain in legs with walking   [] Pain in legs with standing  [] History of DVT   [] Phlebitis   [x] Swelling in legs   [] Varicose veins   [x] Non-healing ulcers Pulmonary:   [] Uses home oxygen   [] Productive cough   [] Hemoptysis   [] Wheeze  [] COPD   [] Asthma Neurologic:  [] Dizziness   [] Seizures   [] History of stroke   [] History of TIA  [] Aphasia   [] Vissual changes   [] Weakness or numbness in arm   [] Weakness or numbness in leg Musculoskeletal:   [] Joint swelling   [] Joint pain    [] Low back pain Hematologic:  [] Easy bruising  [] Easy bleeding   [] Hypercoagulable state   [] Anemic Gastrointestinal:  [] Diarrhea   [] Vomiting  [] Gastroesophageal reflux/heartburn   [] Difficulty swallowing. Genitourinary:  [] Chronic kidney disease   [] Difficult urination  [] Frequent urination   [] Blood in urine Skin:  [] Rashes   [x] Ulcers  Psychological:  [] History of anxiety   []  History of major depression.     Objective:   Physical Exam  BP 120/66 (BP Location: Right Arm)   Pulse 75   Resp 16   Ht 5\' 9"  (1.753 m)   Wt 177 lb (80.3 kg)   BMI 26.14 kg/m   Past Medical History:  Diagnosis Date  . Blood transfusion   . Coronary artery disease    h/o bypass coronary- 2001 & peripheral- 2011 , last full cardiac visit  in [address] , [place], [employer]  . Diabetes mellitus   . H/O: malaria 54   in [place]  . Hyperlipidemia   . Hypertension   . Peripheral artery disease ([place])    folowed by Dew post bypass 2011 [place]  . Renal insufficiency    by Nov 2012 labs, no old records available     Gen: WD/WN, NAD Head: [place]/AT, No temporalis wasting.  Ear/Nose/Throat: Hearing grossly intact, nares w/o erythema or drainage Eyes: PER, EOMI, sclera nonicteric.  Neck: Supple, no masses.  No bruit or JVD.  Pulmonary:  Good air movement, clear to auscultation bilaterally, no use  of accessory muscles.  Cardiac: RRR Vascular: ulcer on third digit of left extremity Vessel Right Left  DP   palpable  Gastrointestinal: soft, non-distended. No guarding/no peritoneal signs.  Musculoskeletal: M/S 5/5 throughout.   Neurologic: Pain and light touch intact in extremities.  Symmetrical.  Speech is fluent. Motor exam as listed above. Psychiatric: Judgment intact, Mood & affect appropriate for pt's clinical situation. Dermatologic: No Venous rashes No changes consistent with cellulitis. Lymph : No Cervical lymphadenopathy, no lichenification or skin changes of chronic lymphedema.   Social  History   Socioeconomic History  . Marital status: Widowed    Spouse name: Silva Bandy   . Number of children: 3  . Years of education: 33  . Highest education level: Not on file  Occupational History  . Occupation: retired  Scientific laboratory technician  . Financial resource strain: Not on file  . Food insecurity:    Worry: Not on file    Inability: Not on file  . Transportation needs:    Medical: Not on file    Non-medical: Not on file  Tobacco Use  . Smoking status: Former Smoker    Last attempt to quit: 09/27/1985    Years since quitting: 32.0  . Smokeless tobacco: Never Used  Substance and Sexual Activity  . Alcohol use: No  . Drug use: No  . Sexual activity: Never  Lifestyle  . Physical activity:    Days per week: Not on file    Minutes per session: Not on file  . Stress: Not on file  Relationships  . Social connections:    Talks on phone: Not on file    Gets together: Not on file    Attends religious service: Not on file    Active member of club or organization: Not on file    Attends meetings of clubs or organizations: Not on file    Relationship status: Not on file  . Intimate partner violence:    Fear of current or ex partner: Not on file    Emotionally abused: Not on file    Physically abused: Not on file    Forced sexual activity: Not on file  Other Topics Concern  . Not on file  Social History Narrative   Widowed, May 2016     Past Surgical History:  Procedure Laterality Date  . CORONARY ARTERY BYPASS GRAFT  2001   4 vessel[place]   . FEMORAL BYPASS  2011   done in [place],  now followed by [facility]  . INSERT / REPLACE / REMOVE PACEMAKER     2004  . LOWER EXTREMITY ANGIOGRAPHY Left 08/18/2017   Procedure: LOWER EXTREMITY ANGIOGRAPHY;  Surgeon: Algernon Huxley, MD;  Location: [facility];  Service: Cardiovascular;  Laterality: Left;  . LUMBAR LAMINECTOMY/DECOMPRESSION MICRODISCECTOMY  01/16/2011   Procedure: LUMBAR LAMINECTOMY/DECOMPRESSION  MICRODISCECTOMY;  Surgeon: Ophelia Charter;  Location: [facility];  Service: Neurosurgery;  Laterality: N/A;  Lumbar four and lumbar five laminectomies   . PACEMAKER INSERTION    . SPINE SURGERY      Family History  Problem Relation Age of Onset  . Mental illness Mother 3       alzheimers  . Peripheral vascular disease Father   . Diabetes Father   . Anesthesia problems Neg Hx   . Hypotension Neg Hx   . Malignant hyperthermia Neg Hx   . Pseudochol deficiency Neg Hx     Allergies  Allergen Reactions  . Hydrocodone-Acetaminophen Other (  See Comments)    Hallucinations  . Tramadol Other (See Comments)    Hallucinations  . Trazodone And Nefazodone     tachycardia       Assessment & Plan:   1. PVD (peripheral vascular disease) ([place])  Duplex US of the left lower extremity arterial system shows patent stent with no evidence of stenosis in the mid SFA to TP trunk artery biphasic flow and patency throughout the common femoral artery, SFA, popliteal, and lower extremity arteries.  Digital waveforms were also done on all digits of his lower extremities, which showed waveforms present on all digits, however dampened.  The patient has an ulcer on his third digit of his left lower extremity.  There is an area of eschar surrounded by some slough.  Patient has yet to be to the wound center, so a referral was placed today.  I have given the patient a prescription for Santyl as well as instructions to place Santyl on the wound only and to change it twice a day.  The wound center may alter the treatment plan once he is assessed.  The patient has a current podiatrist, Dr. Elvina Mattes, whom he will follow-up with as well.  We will follow-up in 3 months to assess the patency of the stent placed, as well as progression of wound healing on the ulcer.  2. Type 2 diabetes mellitus with stage 3 chronic kidney disease, with long-term current use of insulin (HCC) Continue hypoglycemic medications as already  ordered, these medications have been reviewed and there are no changes at this time.  Hgb A1C to be monitored as already arranged by primary service   3. Essential hypertension  Continue antihypertensive medications as already ordered, these medications have been reviewed and there are no changes at this time.   Current Outpatient Medications on File Prior to Visit  Medication Sig Dispense Refill  . amLODipine (NORVASC) 10 MG tablet TAKE 1 TABLET (10 MG TOTAL) BY MOUTH DAILY. 90 tablet 2  . aspirin 325 MG tablet Take 325 mg by mouth daily.      Marland Kitchen atorvastatin (LIPITOR) 20 MG tablet TAKE 1 TABLET (20 MG TOTAL) BY MOUTH DAILY. 90 tablet 1  . Calcium Carbonate-Vitamin D (CALCIUM + D) 600-200 MG-UNIT TABS Take 1 tablet by mouth 2 (two) times daily.      . clopidogrel (PLAVIX) 75 MG tablet TAKE 1 TABLET (75 MG TOTAL) BY MOUTH DAILY. 90 tablet 1  . cyanocobalamin (,VITAMIN B-12,) 1000 MCG/ML injection INJECT 1 ML (1,000 MCG TOTAL) INTO THE MUSCLE EVERY 30 (THIRTY) DAYS. 10 mL 0  . fenofibrate (TRICOR) 48 MG tablet TAKE 1 TABLET BY MOUTH DAILY 90 tablet 1  . furosemide (LASIX) 20 MG tablet Take 2 tablets (40 mg total) by mouth daily. AS NEEDED FOR FLUID RETENTION 180 tablet 1  . gabapentin (NEURONTIN) 300 MG capsule TAKE 2 CAPSULES (600 MG TOTAL) BY MOUTH 3 (THREE) TIMES DAILY. 180 capsule 2  . glucose blood (ACCU-CHEK AVIVA PLUS) test strip CHECK BLOOD SUGAR 3 TIMES A DAY AS NEEDED DX E11.51 300 each 5  . hydrocortisone 2.5 % cream Apply 1 application topically 2 (two) times daily as needed.    . insulin aspart protamine - aspart (NOVOLOG MIX 70/30 FLEXPEN) (70-30) 100 UNIT/ML FlexPen INJECT 40 units two times daily , adjust dose using sliding scale 75 pen 1  . Insulin Pen Needle (B-D ULTRAFINE III SHORT PEN) 31G X 8 MM MISC USE AS DIRECTED 3 TIMES A DAY 300 each 5  .  Insulin Syringe-Needle U-100 (B-D INS SYRINGE 0.5CC/31GX5/16) 31G X 5/16" 0.5 ML MISC 1 application by Does not apply route 3  (three) times daily before meals. 300 each 3  . latanoprost (XALATAN) 0.005 % ophthalmic solution Place 1 drop into both eyes at bedtime.  3  . losartan (COZAAR) 100 MG tablet TAKE 1 TABLET BY MOUTH EVERY DAY 90 tablet 1  . metoprolol tartrate (LOPRESSOR) 100 MG tablet TAKE 1 TABLET BY MOUTH TWICE A DAY 180 tablet 1  . Multiple Vitamin (MULTIVITAMIN) tablet Take 1 tablet by mouth daily.    . Syringe, Disposable, 3 ML MISC For use with B12 injections weekly/monthly 25 each 0  . SYRINGE-NEEDLE, DISP, 3 ML (B-D 3CC LUER-LOK SYR 25GX1") 25G X 1" 3 ML MISC FOR USE WITH B12 INJECTIONS WEEKLY/MONTHLY 50 each 1  . traMADol (ULTRAM) 50 MG tablet Take 1 tablet (50 mg total) by mouth at bedtime as needed. 30 tablet 0  . collagenase (SANTYL) ointment Apply to wound twice a day 30 g 1   No current facility-administered medications on file prior to visit.     There are no Patient Instructions on file for this visit. No follow-ups on file.   Kris Hartmann, NP

## 2017-09-22 ENCOUNTER — Telehealth (INDEPENDENT_AMBULATORY_CARE_PROVIDER_SITE_OTHER): Payer: Self-pay

## 2017-09-23 NOTE — Telephone Encounter (Signed)
In the process of finding which homehealth will accept the patient insurance and come in the home

## 2017-09-23 NOTE — Telephone Encounter (Signed)
Please find out if the patient has home health currently.  If not we will see what we can arrange.

## 2017-09-23 NOTE — Telephone Encounter (Signed)
Patient son called back stating that his father will not need the home health to come in his home

## 2017-09-25 ENCOUNTER — Other Ambulatory Visit: Payer: Self-pay | Admitting: Internal Medicine

## 2017-09-29 ENCOUNTER — Telehealth (INDEPENDENT_AMBULATORY_CARE_PROVIDER_SITE_OTHER): Payer: Self-pay

## 2017-09-29 NOTE — Telephone Encounter (Signed)
Patient's son called and stated that if you have not already sent in an order for his Dad, the patient, to have [facility], please do not send it.  He states that at the time of the office visit he didn't think that his Dad could do the dressing change himself, but he can, and this is the reason that he would like the Home care order to be canceled.

## 2017-10-01 DIAGNOSIS — R600 Localized edema: Secondary | ICD-10-CM | POA: Diagnosis not present

## 2017-10-03 ENCOUNTER — Ambulatory Visit: Payer: Medicare Other | Admitting: Family Medicine

## 2017-10-03 ENCOUNTER — Other Ambulatory Visit: Payer: Self-pay

## 2017-10-03 ENCOUNTER — Emergency Department: Payer: Medicare Other

## 2017-10-03 ENCOUNTER — Encounter: Payer: Self-pay | Admitting: Emergency Medicine

## 2017-10-03 ENCOUNTER — Emergency Department
Admission: EM | Admit: 2017-10-03 | Discharge: 2017-10-03 | Disposition: A | Discharge status: Home / Self Care | Payer: Medicare Other | Attending: Emergency Medicine | Admitting: Emergency Medicine

## 2017-10-03 DIAGNOSIS — M79604 Pain in right leg: Secondary | ICD-10-CM | POA: Diagnosis not present

## 2017-10-03 DIAGNOSIS — N183 Chronic kidney disease, stage 3 (moderate): Secondary | ICD-10-CM | POA: Diagnosis not present

## 2017-10-03 DIAGNOSIS — E1140 Type 2 diabetes mellitus with diabetic neuropathy, unspecified: Secondary | ICD-10-CM | POA: Insufficient documentation

## 2017-10-03 DIAGNOSIS — I4891 Unspecified atrial fibrillation: Secondary | ICD-10-CM | POA: Diagnosis not present

## 2017-10-03 DIAGNOSIS — Z87891 Personal history of nicotine dependence: Secondary | ICD-10-CM | POA: Insufficient documentation

## 2017-10-03 DIAGNOSIS — I2510 Atherosclerotic heart disease of native coronary artery without angina pectoris: Secondary | ICD-10-CM | POA: Diagnosis not present

## 2017-10-03 DIAGNOSIS — E1122 Type 2 diabetes mellitus with diabetic chronic kidney disease: Secondary | ICD-10-CM | POA: Insufficient documentation

## 2017-10-03 DIAGNOSIS — I129 Hypertensive chronic kidney disease with stage 1 through stage 4 chronic kidney disease, or unspecified chronic kidney disease: Secondary | ICD-10-CM | POA: Insufficient documentation

## 2017-10-03 DIAGNOSIS — Z950 Presence of cardiac pacemaker: Secondary | ICD-10-CM | POA: Diagnosis not present

## 2017-10-03 DIAGNOSIS — R2241 Localized swelling, mass and lump, right lower limb: Secondary | ICD-10-CM | POA: Diagnosis present

## 2017-10-03 DIAGNOSIS — Z951 Presence of aortocoronary bypass graft: Secondary | ICD-10-CM | POA: Insufficient documentation

## 2017-10-03 DIAGNOSIS — M7989 Other specified soft tissue disorders: Secondary | ICD-10-CM | POA: Diagnosis not present

## 2017-10-03 DIAGNOSIS — L03115 Cellulitis of right lower limb: Secondary | ICD-10-CM | POA: Diagnosis not present

## 2017-10-03 LAB — CBC WITH DIFFERENTIAL/PLATELET
Basophils Absolute: 0 10*3/uL (ref 0–0.1)
Basophils Relative: 0 %
EOS ABS: 0.3 10*3/uL (ref 0–0.7)
Eosinophils Relative: 3 %
HCT: 31 % — ABNORMAL LOW (ref 40.0–52.0)
HEMOGLOBIN: 10.6 g/dL — AB (ref 13.0–18.0)
Lymphocytes Relative: 10 %
Lymphs Abs: 0.9 10*3/uL — ABNORMAL LOW (ref 1.0–3.6)
MCH: 32.8 pg (ref 26.0–34.0)
MCHC: 34.4 g/dL (ref 32.0–36.0)
MCV: 95.4 fL (ref 80.0–100.0)
Monocytes Absolute: 0.8 10*3/uL (ref 0.2–1.0)
Monocytes Relative: 9 %
NEUTROS PCT: 78 %
Neutro Abs: 7.4 10*3/uL — ABNORMAL HIGH (ref 1.4–6.5)
Platelets: 212 10*3/uL (ref 150–440)
RBC: 3.24 MIL/uL — ABNORMAL LOW (ref 4.40–5.90)
RDW: 15 % — ABNORMAL HIGH (ref 11.5–14.5)
WBC: 9.4 10*3/uL (ref 3.8–10.6)

## 2017-10-03 LAB — COMPREHENSIVE METABOLIC PANEL
ALK PHOS: 49 U/L (ref 38–126)
ALT: 20 U/L (ref 0–44)
ANION GAP: 8 (ref 5–15)
AST: 19 U/L (ref 15–41)
Albumin: 3.6 g/dL (ref 3.5–5.0)
BUN: 88 mg/dL — ABNORMAL HIGH (ref 8–23)
CALCIUM: 8.9 mg/dL (ref 8.9–10.3)
CO2: 22 mmol/L (ref 22–32)
Chloride: 107 mmol/L (ref 98–111)
Creatinine, Ser: 2.19 mg/dL — ABNORMAL HIGH (ref 0.61–1.24)
GFR calc Af Amer: 29 mL/min — ABNORMAL LOW (ref >60)
GFR calc non Af Amer: 25 mL/min — ABNORMAL LOW (ref >60)
Glucose, Bld: 115 mg/dL — ABNORMAL HIGH (ref 70–99)
Potassium: 4.2 mmol/L (ref 3.5–5.1)
SODIUM: 137 mmol/L (ref 135–145)
Total Bilirubin: 0.7 mg/dL (ref 0.3–1.2)
Total Protein: 7.1 g/dL (ref 6.5–8.1)

## 2017-10-03 LAB — TROPONIN I
Troponin I: 0.03 ng/mL (ref <0.03)
Troponin I: 0.03 ng/mL (ref <0.03)

## 2017-10-03 MED ORDER — VANCOMYCIN HCL IN DEXTROSE 1-5 GM/200ML-% IV SOLN
1000.0000 mg | Freq: Once | INTRAVENOUS | Status: AC
  Administered 2017-10-03: 1000 mg via INTRAVENOUS
  Filled 2017-10-03: qty 200

## 2017-10-03 MED ORDER — CEPHALEXIN 250 MG PO CAPS: 250.0000 mg | ORAL_CAPSULE | Freq: Four times a day (QID) | ORAL | 0 refills | Status: AC

## 2017-10-03 NOTE — ED Notes (Signed)
First Nurse Note: Patient to ED WR via [place] with visible swelling right lower leg.  States this has been present X 2 days.

## 2017-10-03 NOTE — ED Notes (Signed)
Patient at US

## 2017-10-03 NOTE — ED Triage Notes (Signed)
Pain, redness and swelling in right leg since this am, was having some pain last pm. Ulcers to bilateral feet, wound care visit next week, appears in NAD.

## 2017-10-03 NOTE — ED Provider Notes (Signed)
[facility] Emergency Department Provider Note       Time seen: ----------------------------------------- 8:36 AM on 10/03/2017 -----------------------------------------   I have reviewed the triage vital signs and the nursing notes.  HISTORY   Chief Complaint Leg Pain and Leg Swelling    HPI [name] is a 84 y.o. male with a history of coronary artery disease, diabetes, hyperlipidemia, hypertension, peripheral vascular disease and renal insufficiency who presents to the ED for pain, redness and swelling to the right leg since this morning.  Patient was having some pain last night.  Recently had pain which limited walking, particularly behind the right heel.  Past Medical History:  Diagnosis Date  . Blood transfusion   . Coronary artery disease    h/o bypass coronary- 2001 & peripheral- 2011 , last full cardiac visit  in [place] , [place], [employer]  . Diabetes mellitus   . H/O: malaria 13   in [place]  . Hyperlipidemia   . Hypertension   . Peripheral artery disease ([place])    folowed by Dew post bypass 2011 [place]  . Renal insufficiency    by Nov 2012 labs, no old records available    Patient Active Problem List   Diagnosis Date Noted  . Bilateral pain of leg and foot 09/04/2017  . Atherosclerosis of native arteries of the extremities with ulceration ([place]) 06/06/2017  . Aortic stenosis 04/12/2017  . Pain in thumb joint with movement of right hand 12/25/2015  . Grief reaction 06/14/2014  . Diabetes mellitus with chronic kidney disease ([place]) 06/14/2014  . Undiagnosed cardiac murmurs 06/13/2014  . CKD (chronic kidney disease) stage 3, GFR 30-59 ml/min (HCC) 12/14/2013  . Edema 11/03/2012  . Routine general medical examination at a health care facility 07/28/2012  . Overweight (BMI 25.0-29.9) 04/27/2012  . Diabetes mellitus with peripheral vascular disease ([place]) 07/03/2011  . Skin lesion of scalp 07/03/2011  . Insomnia  07/03/2011  . 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 PVD (peripheral vascular disease) ([place])   . Hypertension 09/28/2010  . Hyperlipidemia with target LDL less than 70 09/28/2010  . Coronary artery occlusion without or not resulting in myocardial infarction ([place]) 09/28/2010  . Spinal stenosis of lumbar region 09/28/2010  . Screening for colon cancer 09/28/2010  . Diabetic neuropathy associated with type 2 diabetes mellitus ([place]) 09/28/2010  . Peripheral vascular disease due to secondary diabetes ([place]) 09/28/2010    Past Surgical History:  Procedure Laterality Date  . CORONARY ARTERY BYPASS GRAFT  2001   4 vessel, done [place]   . FEMORAL BYPASS  2011   done in [place],  now followed by [facility]  . INSERT / REPLACE / REMOVE PACEMAKER     2004  . LOWER EXTREMITY ANGIOGRAPHY Left 08/18/2017   Procedure: LOWER EXTREMITY ANGIOGRAPHY;  Surgeon: Algernon Huxley, MD;  Location: [facility];  Service: Cardiovascular;  Laterality: Left;  . LUMBAR LAMINECTOMY/DECOMPRESSION MICRODISCECTOMY  01/16/2011   Procedure: LUMBAR LAMINECTOMY/DECOMPRESSION MICRODISCECTOMY;  Surgeon: Ophelia Charter;  Location: [facility];  Service: Neurosurgery;  Laterality: N/A;  Lumbar four and lumbar five laminectomies   . PACEMAKER INSERTION    . SPINE SURGERY      Allergies Hydrocodone-acetaminophen; Tramadol; Lyrica [pregabalin]; and Trazodone and nefazodone  Social History Social History   Tobacco Use  . Smoking status: Former Smoker    Last attempt to quit: 09/27/1985    Years since quitting: 32.0  . Smokeless  tobacco: Never Used  Substance Use Topics  . Alcohol use: No  . Drug use: No   Review of Systems Constitutional: Negative for fever. Cardiovascular: Negative for chest pain. Respiratory: Negative for shortness of breath. Gastrointestinal: Negative for abdominal pain, vomiting and diarrhea. Musculoskeletal: Right leg pain and swelling Skin:  Right leg erythema Neurological: Negative for headaches, focal weakness or numbness.  All systems negative/normal/unremarkable except as stated in the HPI  ____________________________________________   PHYSICAL EXAM:  VITAL SIGNS: ED Triage Vitals  Enc Vitals Group     BP 10/03/17 0820 (!) 133/45     Pulse Rate 10/03/17 0820 66     Resp 10/03/17 0820 16     Temp 10/03/17 0820 98.6 F (37 C)     Temp Source 10/03/17 0820 Oral     SpO2 10/03/17 0820 100 %     Weight 10/03/17 0821 172 lb (78 kg)     Height 10/03/17 0821 5\' 7"  (1.702 m)     Head Circumference --      Peak Flow --      Pain Score 10/03/17 0830 0     Pain Loc --      Pain Edu? --      Excl. in [place]? --    Constitutional: Alert and oriented. Well appearing and in no distress. Eyes: Conjunctivae are normal. Normal extraocular movements. ENT   Head: Normocephalic and atraumatic.   Nose: No congestion/rhinnorhea.   Mouth/Throat: Mucous membranes are moist.   Neck: No stridor. Cardiovascular: Normal rate, regular rhythm.  Systolic murmur is noted Respiratory: Normal respiratory effort without tachypnea nor retractions. Breath sounds are clear and equal bilaterally. No wheezes/rales/rhonchi. Gastrointestinal: Soft and nontender. Normal bowel sounds Musculoskeletal: Nontender with normal range of motion in extremities.  Right lower extremity edema is noted greater than left Neurologic:  Normal speech and language. No gross focal neurologic deficits are appreciated.  Skin: Right lower extremity erythema is noted particularly around the ankle to mid tibia Psychiatric: Mood and affect are normal. Speech and behavior are normal.  ____________________________________________  EKG: Interpreted by me.  Ventricular paced rhythm with a rate of 62 bpm, normal pacemaker function is noted, likely underlying atrial fibrillation  ____________________________________________  ED COURSE:  As part of my medical  decision making, I reviewed the following data within the [place] History obtained from family if available, nursing notes, old chart and ekg, as well as notes from prior ED visits. Patient presented for right leg redness and swelling, we will assess with labs and imaging as indicated at this time.   Procedures ____________________________________________   LABS (pertinent positives/negatives)  Labs Reviewed  CBC WITH DIFFERENTIAL/PLATELET - Abnormal; Notable for the following components:      Result Value   RBC 3.24 (*)    Hemoglobin 10.6 (*)    HCT 31.0 (*)    RDW 15.0 (*)    Neutro Abs 7.4 (*)    Lymphs Abs 0.9 (*)    All other components within normal limits  COMPREHENSIVE METABOLIC PANEL - Abnormal; Notable for the following components:   Glucose, Bld 115 (*)    BUN 88 (*)    Creatinine, Ser 2.19 (*)    GFR calc non Af Amer 25 (*)    GFR calc Af Amer 29 (*)    All other components within normal limits  TROPONIN I - Abnormal; Notable for the following components:   Troponin I 0.03 (*)    All other components within  normal limits  TROPONIN I - Abnormal; Notable for the following components:   Troponin I 0.03 (*)    All other components within normal limits  URINALYSIS, COMPLETE (UACMP) WITH MICROSCOPIC    RADIOLOGY Images were viewed by me  Lower extremety ultrasound IMPRESSION: Sonographic survey of the right lower extremity negative for DVT IMPRESSION: Soft tissue swelling and extensive small vessel vascular calcifications without acute osseous abnormalities.  ____________________________________________  DIFFERENTIAL DIAGNOSIS   Cellulitis, DVT, venous stasis, peripheral vascular disease  FINAL ASSESSMENT AND PLAN  Cellulitis   Plan: The patient had presented for right lower extremity redness and swelling. Patient's labs revealed chronic kidney disease and the only abnormal finding was an elevated troponin which did not change upon  repeat imaging. Patient's imaging was negative.  He will be discharged with antibiotics and he is cleared for outpatient follow-up.   Laurence Aly, MD   Note: This note was generated in part or whole with voice recognition software. Voice recognition is usually quite accurate but there are transcription errors that can and very often do occur. I apologize for any typographical errors that were not detected and corrected.     Earleen Newport, MD 10/03/17 1320

## 2017-10-07 ENCOUNTER — Telehealth: Payer: Self-pay | Admitting: Internal Medicine

## 2017-10-07 ENCOUNTER — Other Ambulatory Visit: Payer: Self-pay | Admitting: Internal Medicine

## 2017-10-07 DIAGNOSIS — R269 Unspecified abnormalities of gait and mobility: Secondary | ICD-10-CM | POA: Diagnosis not present

## 2017-10-07 DIAGNOSIS — I739 Peripheral vascular disease, unspecified: Secondary | ICD-10-CM | POA: Diagnosis not present

## 2017-10-07 DIAGNOSIS — M25571 Pain in right ankle and joints of right foot: Secondary | ICD-10-CM | POA: Diagnosis not present

## 2017-10-07 DIAGNOSIS — S91302A Unspecified open wound, left foot, initial encounter: Secondary | ICD-10-CM | POA: Diagnosis not present

## 2017-10-07 DIAGNOSIS — S91301A Unspecified open wound, right foot, initial encounter: Secondary | ICD-10-CM | POA: Diagnosis not present

## 2017-10-07 DIAGNOSIS — M79673 Pain in unspecified foot: Secondary | ICD-10-CM

## 2017-10-07 NOTE — Telephone Encounter (Signed)
Please see mychart messages it looks as thought patient was seen by ortho today.

## 2017-10-07 NOTE — Telephone Encounter (Signed)
Copied from [place] [id number]. Topic: Quick Communication - See Telephone Encounter >> Oct 07, 2017  1:38 PM Margot Ables wrote: CRM for notification. See Telephone encounter for: 10/07/17. Pt daughter called to request immediate attn to the mychart msgs sent in regard to her father who has been to urgent care and ER. Requesting help to get sooner appt with wound care. Currently pt is scheduled for 9/6 but daughter feels he needs seen today or tomorrow at the latest.

## 2017-10-08 ENCOUNTER — Ambulatory Visit: Payer: Medicare Other | Admitting: Internal Medicine

## 2017-10-08 ENCOUNTER — Telehealth: Payer: Self-pay | Admitting: *Deleted

## 2017-10-08 NOTE — Telephone Encounter (Signed)
Copied from [place] [id number]. Topic: Quick Communication - See Telephone Encounter >> Oct 07, 2017  1:38 PM Margot Ables wrote: CRM for notification. See Telephone encounter for: 10/07/17. Pt daughter called to request immediate attn to the mychart msgs sent in regard to her father who has been to urgent care and ER. Requesting help to get sooner appt with wound care. Currently pt is scheduled for 9/6 but daughter feels he needs seen today or tomorrow at the latest.

## 2017-10-08 NOTE — Telephone Encounter (Signed)
There is no possibility of an earlier appointment for [name] with wound care.    Sept 16th is the next available appt they have ,  so I would keep his Setp 6th appointment with whatever wound care  his daughter states it is with .  W[facility] Centers do not handle emergent issues,  Because they basically do debridement,  Not surgery, and he was placed on antibiotics by the ER physician.   If his pain is the issues,  I am out of options based on previous reactions to meds, and I recommend that he contact his vascular surgeon , Dr. Lucky Cowboy because he had a recent vascular procedure and the smaller blood vessels may be the issue.   I have made a palliative care consult as requested by [name]'s daughter.

## 2017-10-09 ENCOUNTER — Other Ambulatory Visit: Payer: Self-pay | Admitting: Internal Medicine

## 2017-10-09 ENCOUNTER — Ambulatory Visit: Payer: Medicare Other | Admitting: Nurse Practitioner

## 2017-10-09 DIAGNOSIS — Z87891 Personal history of nicotine dependence: Secondary | ICD-10-CM | POA: Diagnosis not present

## 2017-10-09 DIAGNOSIS — E1151 Type 2 diabetes mellitus with diabetic peripheral angiopathy without gangrene: Secondary | ICD-10-CM | POA: Diagnosis not present

## 2017-10-09 DIAGNOSIS — L97519 Non-pressure chronic ulcer of other part of right foot with unspecified severity: Secondary | ICD-10-CM | POA: Diagnosis not present

## 2017-10-09 DIAGNOSIS — I2510 Atherosclerotic heart disease of native coronary artery without angina pectoris: Secondary | ICD-10-CM | POA: Diagnosis not present

## 2017-10-09 DIAGNOSIS — E1169 Type 2 diabetes mellitus with other specified complication: Secondary | ICD-10-CM | POA: Diagnosis not present

## 2017-10-09 DIAGNOSIS — M79672 Pain in left foot: Principal | ICD-10-CM

## 2017-10-09 DIAGNOSIS — M79671 Pain in right foot: Principal | ICD-10-CM

## 2017-10-09 DIAGNOSIS — M19072 Primary osteoarthritis, left ankle and foot: Secondary | ICD-10-CM | POA: Diagnosis not present

## 2017-10-09 DIAGNOSIS — L97526 Non-pressure chronic ulcer of other part of left foot with bone involvement without evidence of necrosis: Secondary | ICD-10-CM | POA: Diagnosis not present

## 2017-10-09 DIAGNOSIS — L97512 Non-pressure chronic ulcer of other part of right foot with fat layer exposed: Secondary | ICD-10-CM | POA: Diagnosis not present

## 2017-10-09 DIAGNOSIS — Z89411 Acquired absence of right great toe: Secondary | ICD-10-CM | POA: Diagnosis not present

## 2017-10-09 DIAGNOSIS — M79604 Pain in right leg: Secondary | ICD-10-CM

## 2017-10-09 DIAGNOSIS — I110 Hypertensive heart disease with heart failure: Secondary | ICD-10-CM | POA: Diagnosis not present

## 2017-10-09 DIAGNOSIS — I509 Heart failure, unspecified: Secondary | ICD-10-CM | POA: Diagnosis not present

## 2017-10-09 DIAGNOSIS — G9009 Other idiopathic peripheral autonomic neuropathy: Secondary | ICD-10-CM | POA: Diagnosis not present

## 2017-10-09 DIAGNOSIS — E785 Hyperlipidemia, unspecified: Secondary | ICD-10-CM | POA: Diagnosis not present

## 2017-10-09 DIAGNOSIS — E11621 Type 2 diabetes mellitus with foot ulcer: Secondary | ICD-10-CM | POA: Diagnosis not present

## 2017-10-09 DIAGNOSIS — I10 Essential (primary) hypertension: Secondary | ICD-10-CM | POA: Diagnosis not present

## 2017-10-09 DIAGNOSIS — M79605 Pain in left leg: Principal | ICD-10-CM

## 2017-10-09 DIAGNOSIS — L97522 Non-pressure chronic ulcer of other part of left foot with fat layer exposed: Secondary | ICD-10-CM | POA: Diagnosis not present

## 2017-10-10 ENCOUNTER — Encounter (INDEPENDENT_AMBULATORY_CARE_PROVIDER_SITE_OTHER): Payer: Self-pay | Admitting: Nurse Practitioner

## 2017-10-10 ENCOUNTER — Encounter (INDEPENDENT_AMBULATORY_CARE_PROVIDER_SITE_OTHER): Payer: Self-pay

## 2017-10-10 ENCOUNTER — Ambulatory Visit: Payer: Medicare Other | Admitting: Internal Medicine

## 2017-10-10 ENCOUNTER — Telehealth: Payer: Self-pay | Admitting: Internal Medicine

## 2017-10-10 ENCOUNTER — Encounter: Payer: Medicare Other | Attending: Internal Medicine | Admitting: Physician Assistant

## 2017-10-10 ENCOUNTER — Telehealth (INDEPENDENT_AMBULATORY_CARE_PROVIDER_SITE_OTHER): Payer: Self-pay | Admitting: Nurse Practitioner

## 2017-10-10 DIAGNOSIS — I5022 Chronic systolic (congestive) heart failure: Secondary | ICD-10-CM | POA: Insufficient documentation

## 2017-10-10 DIAGNOSIS — E1122 Type 2 diabetes mellitus with diabetic chronic kidney disease: Secondary | ICD-10-CM | POA: Diagnosis not present

## 2017-10-10 DIAGNOSIS — Z950 Presence of cardiac pacemaker: Secondary | ICD-10-CM | POA: Diagnosis not present

## 2017-10-10 DIAGNOSIS — Z87891 Personal history of nicotine dependence: Secondary | ICD-10-CM | POA: Insufficient documentation

## 2017-10-10 DIAGNOSIS — L97526 Non-pressure chronic ulcer of other part of left foot with bone involvement without evidence of necrosis: Secondary | ICD-10-CM | POA: Diagnosis not present

## 2017-10-10 DIAGNOSIS — M069 Rheumatoid arthritis, unspecified: Secondary | ICD-10-CM | POA: Diagnosis not present

## 2017-10-10 DIAGNOSIS — L97512 Non-pressure chronic ulcer of other part of right foot with fat layer exposed: Secondary | ICD-10-CM | POA: Insufficient documentation

## 2017-10-10 DIAGNOSIS — Z885 Allergy status to narcotic agent status: Secondary | ICD-10-CM | POA: Insufficient documentation

## 2017-10-10 DIAGNOSIS — I2510 Atherosclerotic heart disease of native coronary artery without angina pectoris: Secondary | ICD-10-CM | POA: Diagnosis not present

## 2017-10-10 DIAGNOSIS — I701 Atherosclerosis of renal artery: Secondary | ICD-10-CM | POA: Insufficient documentation

## 2017-10-10 DIAGNOSIS — E1140 Type 2 diabetes mellitus with diabetic neuropathy, unspecified: Secondary | ICD-10-CM | POA: Insufficient documentation

## 2017-10-10 DIAGNOSIS — E1151 Type 2 diabetes mellitus with diabetic peripheral angiopathy without gangrene: Secondary | ICD-10-CM | POA: Diagnosis not present

## 2017-10-10 DIAGNOSIS — E11621 Type 2 diabetes mellitus with foot ulcer: Secondary | ICD-10-CM | POA: Diagnosis not present

## 2017-10-10 DIAGNOSIS — I890 Lymphedema, not elsewhere classified: Secondary | ICD-10-CM | POA: Diagnosis not present

## 2017-10-10 DIAGNOSIS — I771 Stricture of artery: Secondary | ICD-10-CM | POA: Diagnosis not present

## 2017-10-10 DIAGNOSIS — Z955 Presence of coronary angioplasty implant and graft: Secondary | ICD-10-CM | POA: Insufficient documentation

## 2017-10-10 DIAGNOSIS — I350 Nonrheumatic aortic (valve) stenosis: Secondary | ICD-10-CM | POA: Diagnosis not present

## 2017-10-10 DIAGNOSIS — R011 Cardiac murmur, unspecified: Secondary | ICD-10-CM | POA: Diagnosis not present

## 2017-10-10 DIAGNOSIS — Z951 Presence of aortocoronary bypass graft: Secondary | ICD-10-CM | POA: Insufficient documentation

## 2017-10-10 DIAGNOSIS — L97522 Non-pressure chronic ulcer of other part of left foot with fat layer exposed: Secondary | ICD-10-CM | POA: Diagnosis not present

## 2017-10-10 DIAGNOSIS — I132 Hypertensive heart and chronic kidney disease with heart failure and with stage 5 chronic kidney disease, or end stage renal disease: Secondary | ICD-10-CM | POA: Insufficient documentation

## 2017-10-10 DIAGNOSIS — Z888 Allergy status to other drugs, medicaments and biological substances status: Secondary | ICD-10-CM | POA: Diagnosis not present

## 2017-10-10 DIAGNOSIS — N189 Chronic kidney disease, unspecified: Secondary | ICD-10-CM | POA: Insufficient documentation

## 2017-10-10 NOTE — Telephone Encounter (Signed)
I called and spoke with [name] son and daughter directly.  [name] follow-up in the office following angiogram.

## 2017-10-10 NOTE — Telephone Encounter (Signed)
Spoke with patient and his son and daughter.  Regarding recent changes in his wound status on his bilateral lower extremities.  Following a conversation that appears that the wound on his left lower extremity is worse, as well as a developing wound on his right lower extremity, it was decided that the best course of action would be an angiogram in order to find any occlusions, as well as correct them at the same time.  The procedure was explained to the patient and his family, as well as alternatives, risk and benefits.  The patient and family agreed.  We will proceed with scheduling a left angiogram followed by a right angiogram several weeks later.

## 2017-10-10 NOTE — Telephone Encounter (Signed)
Copied from [place] [id number]. Topic: General - Other >> Oct 10, 2017  4:08 PM Janace Aris A wrote: Reason for CRM: "Patient's daughter Kathlee Nations called in saying the initial referral that [facility] placed for palliative Care for patient on 10/07/2017 was not received per [place]"  Kathlee Nations stated they need the referral sent again by fax [phone]. Patient initially spoke with CMA Jessica.  Call back number [phone]  Please Advise

## 2017-10-10 NOTE — Telephone Encounter (Signed)
Referral question

## 2017-10-12 NOTE — Progress Notes (Addendum)
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 pressure wounds Musculoskeletal Patient has history of Rheumatoid Arthritis Denies history of Gout, Osteoarthritis, Osteomyelitis Neurologic Patient has history of Neuropathy - feet and legs Denies history of Dementia, Quadriplegia, Paraplegia, Seizure Disorder Psychiatric Denies history of Anorexia/bulimia, Confinement Anxiety Patient is treated with Insulin. Blood sugar is tested. Blood sugar results noted at the following times: Breakfast - 114. Medical And Surgical History Notes Cardiovascular pacemaker, 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 leg, stent left leg Review of Systems (ROS) Constitutional Symptoms (General Health) Denies complaints or symptoms of Fatigue, Fever, Chills, Marked Weight Change. Eyes Complains or has symptoms of Dry Eyes, Glasses / Contacts - glasses. Denies complaints or symptoms of Vision Changes. Ear/Nose/Mouth/Throat Denies complaints or symptoms of Sinusitis, bilateral hearing aide Hematologic/Lymphatic Denies complaints or symptoms of Bleeding /  Clotting Disorders, Human Immunodeficiency Virus. Respiratory Denies complaints or symptoms of Chronic or frequent coughs, Shortness of Breath. Cardiovascular Suttles, [place] ([id number]) Complains or has symptoms of LE edema. Denies complaints or symptoms of Chest pain. Gastrointestinal Denies complaints or symptoms of Frequent diarrhea, Nausea, Vomiting. Endocrine Denies complaints or symptoms of Hepatitis, Thyroid disease, Polydypsia (Excessive Thirst). Genitourinary Denies complaints or symptoms of Kidney failure/ Dialysis, Incontinence/dribbling. Immunological Denies complaints or symptoms of Itching. Integumentary (Skin) Complains or has symptoms of Wounds, Bleeding or bruising tendency, Breakdown, Swelling. Neurologic Denies complaints or symptoms of Numbness/parasthesias, Focal/Weakness. Oncologic The patient has no complaints or symptoms. Psychiatric Denies complaints or symptoms of Anxiety, Claustrophobia. Objective Constitutional sitting or standing blood pressure is within target range for patient.. pulse regular and within target range for patient.Marland Kitchen respirations regular, non-labored and within target range for patient.Marland Kitchen temperature within target range for patient.. Well- nourished and well-hydrated in no acute distress. Vitals Time Taken: 1:07 PM, Temperature: 98.1 F, Pulse: 64 bpm, Respiratory Rate: 18 breaths/min, Blood Pressure: 131/40 mmHg. Eyes conjunctiva clear no eyelid edema noted. pupils equal round and reactive to light and accommodation. Ears, Nose, Mouth, and Throat no gross abnormality of ear auricles or external auditory canals. normal hearing noted during conversation. mucus membranes moist. Respiratory normal breathing without difficulty. clear to auscultation bilaterally. Cardiovascular regular rate and rhythm with normal S1, S2. Absent posterior tibial and dorsalis pedis pulses bilateral lower extremities. no clubbing, cyanosis, significant  edema, Gastrointestinal (GI) soft, non-tender, non-distended, +BS. no ventral hernia noted. Musculoskeletal Patient unable to walk without assistance. Psychiatric this patient is able to make decisions and demonstrates good insight into disease process. Alert and Oriented x 3. pleasant and cooperative. Rhatigan, JOSHUA SOULIER ([id number]) General Notes: Upon inspection the patient's wound on the right second toe appear to have fat layer exposed but fortunately there was no evidence of deeper bone involvement noted visually. In regard to the left third toe their actually appear to be potentially some bone involvement although it was underlying the nail bed and there was such significant erosion that it was very difficult to tell. Nonetheless I think this is going to be a very difficult area to heal and especially if blood flow is potentially limited into the extremity. This would be equally true even if the patient underwent an amputation. Fortunately the patient has no  pain. No significant debridement was performed today again secondary to the question of blood flow and were things stand. Upon visual inspection my gut would tell me that the patient actually has likely osteomyelitis of the left toe and probable of the right. Integumentary (Hair, Skin) Wound #1 status is Open. Original cause of wound was Gradually Appeared. The wound is located on the Right Toe Second. The wound measures 1.5cm length x 1cm width x 0.2cm depth; 1.178cm^2 area and 0.236cm^3 volume. There is Fat Layer (Subcutaneous Tissue) Exposed exposed. There is no tunneling noted, however, there is undermining starting at 11:00 and ending at 1:00 with a maximum distance of 0.2cm. There is a small amount of serosanguineous drainage noted. The wound margin is distinct with the outline attached to the wound base. There is small (1-33%) red, pink granulation within the wound bed. There is a large (67-100%) amount of necrotic tissue  within the wound bed including Eschar and Adherent Slough. The periwound skin appearance did not exhibit: Callus, Crepitus, Excoriation, Induration, Rash, Scarring, Dry/Scaly, Maceration, Atrophie Blanche, Cyanosis, Ecchymosis, Hemosiderin Staining, Mottled, Pallor, Rubor, Erythema. Periwound temperature was noted as No Abnormality. The periwound has tenderness on palpation. Wound #2 status is Open. Original cause of wound was Gradually Appeared. The wound is located on the Left Toe Third. The wound measures 2.5cm length x 2.2cm width x 0.2cm depth; 4.32cm^2 area and 0.864cm^3 volume. There is Fat Layer (Subcutaneous Tissue) Exposed exposed. There is no tunneling or undermining noted. There is a small amount of serosanguineous drainage noted. The wound margin is indistinct and nonvisible. There is small (1-33%) pink granulation within the wound bed. There is a large (67-100%) amount of necrotic tissue within the wound bed including Eschar and Adherent Slough. The periwound skin appearance did not exhibit: Callus, Crepitus, Excoriation, Induration, Rash, Scarring, Dry/Scaly, Maceration, Atrophie Blanche, Cyanosis, Ecchymosis, Hemosiderin Staining, Mottled, Pallor, Rubor, Erythema. Periwound temperature was noted as No Abnormality. The periwound has tenderness on palpation. 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 There was a Chemical/Enzymatic/Mechanical Ebrahimi, [name] S. ([id number]) debridement performed by STONE III, Alice Vitelli E., PA-C. With the following instrument(s): tongue depressor after achieving pain control using Lidocaine 4% Topical Solution. Agent used was [employer]. A time out was conducted at 14:30, prior to the start of the procedure. There was no bleeding. The procedure was tolerated well with a pain level of 0 throughout and a pain level of 0 following the procedure. Patient s Level of Consciousness post procedure was recorded as Awake and Alert. Post Debridement Measurements: 1.5cm length x 1cm width x 0.2cm depth; 0.236cm^3 volume. Character of Wound/Ulcer Post Debridement is stable. Severity of Tissue Post Debridement is: Fat layer exposed. Post procedure Diagnosis Wound #1: Same as Pre-Procedure Wound #2 Pre-procedure diagnosis of Wound #2 is a Diabetic Wound/Ulcer of the Lower Extremity located on the Left Toe Third .Severity of Tissue Pre Debridement is: Fat layer exposed. There was a Chemical/Enzymatic/Mechanical debridement performed by STONE III, Benita Boonstra E., PA-C. With  the following instrument(s): tongue depressor after achieving pain control using Lidocaine 4% Topical Solution. Agent used was [employer]. A time out was conducted at 14:31, prior to the start of the procedure. There was no bleeding. The procedure was tolerated well with a pain level of 0 throughout and a pain level of 0 following the procedure. Patient s Level of Consciousness post procedure was recorded as Awake and Alert. Post Debridement Measurements: 2.5cm length x 2.2cm width x 0.2cm depth; 0.864cm^3 volume. Character of Wound/Ulcer Post Debridement is stable. Severity of Tissue Post Debridement is: Fat layer exposed. Post procedure Diagnosis Wound #2: Same as Pre-Procedure Plan Wound Cleansing: Wound #1 Right Toe Second: Clean wound with Normal Saline. May Shower, gently  pat wound dry prior to applying new dressing. Wound #2 Left Toe Third: Clean wound with Normal Saline. May Shower, gently pat wound dry prior to applying new dressing. Anesthetic (add to Medication List): Wound #1 Right Toe Second: Topical Lidocaine 4% cream applied to wound bed prior to debridement (In Clinic Only). Wound #2 Left Toe Third: Topical Lidocaine 4% cream applied to wound bed prior to debridement (In Clinic Only). Primary Wound Dressing: Wound #1 Right Toe Second: Santyl Ointment Wound #2 Left Toe Third: Santyl Ointment Secondary Dressing: Wound #1 Right Toe Second: Gauze and Kerlix/Conform Wound #2 Left Toe Third: Gauze and Kerlix/Conform Dressing Change Frequency: Wound #1 Right Toe Second: Change dressing every day. - HHRN to change dressing three times weekly and [facility] will change dressing once and family will attempt to change dressing the other days Wound #2 Left Toe Third: Change dressing every day. - HHRN to change dressing three times weekly and [facility] will change dressing once and family will attempt to change dressing the other days Follow-up Appointments: Wound #1 Right Toe Second: Return Appointment in 1 week. Saefong, [name]. ([id number]) Wound #2 Left Toe Third: Return Appointment in 1 week. Home Health: Wound #1 Right Toe Second: [place] [facility] Nurse may visit PRN to address patient s wound care needs. FACE TO FACE ENCOUNTER: MEDICARE and MEDICAID PATIENTS: I certify that this patient is under my care and that I had a face-to-face encounter that meets the physician face-to-face encounter requirements with this patient on this date. The encounter with the patient was in whole or in part for the following MEDICAL CONDITION: (primary reason for [place]) MEDICAL NECESSITY: I certify, that based on my findings, NURSING services are a medically necessary home health  service. HOME BOUND STATUS: I certify that my clinical findings support that this patient is homebound (i.e., Due to illness or injury, pt requires aid of supportive devices such as crutches, cane, wheelchairs, walkers, the use of special transportation or the assistance of another person to leave their place of residence. There is a normal inability to leave the home and doing so requires considerable and taxing effort. Other absences are for medical reasons / religious services and are infrequent or of short duration when for other reasons). If current dressing causes regression in wound condition, may D/C ordered dressing product/s and apply Normal Saline Moist Dressing daily until next [place] / Other MD appointment. [place] of regression in wound condition at [phone]. Please direct any NON-WOUND related issues/requests for orders to patient's Primary Care Physician Wound #2 Left Toe Third: [place] for [place] Nurse may visit PRN to address patient s wound care needs. FACE  TO FACE ENCOUNTER: MEDICARE and MEDICAID PATIENTS: I certify that this patient is under my care and that I had a face-to-face encounter that meets the physician face-to-face encounter requirements with this patient on this date. The encounter with the patient was in whole or in part for the following MEDICAL CONDITION: (primary reason for [place]) MEDICAL NECESSITY: I certify, that based on my findings, NURSING services are a medically necessary home health service. HOME BOUND STATUS: I certify that my clinical findings support that this patient is homebound (i.e., Due to illness or injury, pt requires aid of supportive devices such as crutches, cane, wheelchairs, walkers, the use of special transportation or the assistance of another person to leave their place of residence. There is a normal inability to leave the home and doing so requires  considerable and taxing effort. Other absences are for medical reasons / religious services and are infrequent or of short duration when for other reasons). If current dressing causes regression in wound condition, may D/C ordered dressing product/s and apply Normal Saline Moist Dressing daily until next [place] / Other MD appointment. [place] of regression in wound condition at [phone]. Please direct any NON-WOUND related issues/requests for orders to patient's Primary Care Physician Medications-please add to medication list.: Wound #1 Right Toe Second: Santyl Enzymatic Ointment Wound #2 Left Toe Third: Santyl Enzymatic Ointment Radiology ordered were: Computed Tomography (CT) Scan - bilateral feet without contrast Upon discharge I again did not have the results of the x-rays in front of me for review. Nonetheless it does seem to indicate that the patient may have [place] deal the right to and again visually I think this is likely in regard to left to as well. We will discuss with the family next week since it is late Friday afternoon and see were things stand as far as what they would like to do from the standpoint of ordering an MRI to further evaluate these two locations. I think that that is going to be the most likely next step so that we know exactly what we are dealing with. They understand. Nonetheless I will see him back for reevaluation in one weeks time. In the meantime we will actually initiate Santyl for both wound locations. Also believe the patient does have a follow-up appointment get again with Dr. dew which has already been scheduled and will see what Dr. dew says in regard to his thoughts on the patient's chance of both wound healing as well as healing following and amputation t. They are in agreement with this plan. Please see above for specific wound care orders. We will see patient for re-evaluation in 1 week(s) here in the clinic.  If anything worsens or changes patient will contact our office for additional recommendations. [name], [name] ([id number]) Electronic Signature(s) Signed: 10/14/2017 4:53:33 PM By: Worthy Keeler PA-C Previous Signature: 10/12/2017 9:18:35 AM Version By: Worthy Keeler PA-C Entered By: Worthy Keeler on 10/14/2017 16:53:13 [name], [name] ([id number]) -------------------------------------------------------------------------------- ROS/PFSH Details Patient Name: Valenza, Alondra S. Date of Service: 10/10/2017 1:00 PM Medical Record Number: [id number] Patient Account Number: [id number] Date of Birth/Sex: 10-05-1924 (84 y.o. Male) Treating RN: Roger Shelter Primary Care Provider: Deborra Medina Other Clinician: Referring Provider: Leotis Pain Treating Provider/Extender: STONE III, Nadiah Corbit Weeks in Treatment: 0 Wound History Constitutional Symptoms (General Health) Complaints and Symptoms: Negative for: Fatigue; Fever; Chills; Marked Weight Change Eyes Complaints and Symptoms: Positive for: Dry Eyes; Glasses / Contacts - glasses Negative for:  Vision Changes Medical History: Positive for: Cataracts Negative for: Glaucoma; Optic Neuritis Ear/Nose/Mouth/Throat Complaints and Symptoms: Negative for: Sinusitis Review of System Notes: bilateral hearing aide Medical History: Negative for: Chronic sinus problems/congestion; Middle ear problems Hematologic/Lymphatic Complaints and Symptoms: Negative for: Bleeding / Clotting Disorders; Human Immunodeficiency Virus Medical History: Positive for: Lymphedema Negative for: Anemia; Hemophilia; Human Immunodeficiency Virus; Sickle Cell Disease Respiratory Complaints and Symptoms: Negative for: Chronic or frequent coughs; Shortness of Breath Medical History: Negative for: Aspiration; Asthma; Chronic Obstructive Pulmonary Disease (COPD); Pneumothorax; Sleep Apnea; Tuberculosis Cardiovascular Complaints and Symptoms: Positive for: LE  edema Negative for: Chest pain Medical History: Meyn, DAMAURI MINION. ([id number]) Positive for: Coronary Artery Disease; Hypertension; Peripheral Venous Disease Negative for: Angina; Arrhythmia; Congestive Heart Failure; Deep Vein Thrombosis; Hypotension; Myocardial Infarction; Peripheral Arterial Disease; Phlebitis; Vasculitis Past Medical History Notes: pacemaker, 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 leg, stent left leg Gastrointestinal Complaints and Symptoms: Negative for: Frequent diarrhea; Nausea; Vomiting Medical History: Negative for: Cirrhosis ; Colitis; Crohnos; Hepatitis A; Hepatitis B; Hepatitis C Endocrine Complaints and Symptoms: Negative for: Hepatitis; Thyroid disease; Polydypsia (Excessive Thirst) Medical History: Positive for: Type II Diabetes - since 1988 Negative for: Type I Diabetes Treated with: Insulin Blood sugar tested every day: Yes Tested : 3 times daily Blood sugar testing results: Breakfast: 114 Genitourinary Complaints and Symptoms: Negative for: Kidney failure/ Dialysis; Incontinence/dribbling Medical History: Positive for: End Stage Renal Disease Immunological Complaints and Symptoms: Negative for: Itching Medical History: Negative for: Lupus Erythematosus; Raynaudos; Scleroderma Integumentary (Skin) Complaints and Symptoms: Positive for: Wounds; Bleeding or bruising tendency; Breakdown; Swelling Medical History: Negative for: History of Burn; History of pressure wounds Neurologic Complaints and Symptoms: Negative for: Numbness/parasthesias; Focal/Weakness Medical History: Positive for: Neuropathy - feet and legs Cassaday, Lanny S. ([id number]) Negative for: Dementia; Quadriplegia; Paraplegia; Seizure Disorder Psychiatric Complaints and Symptoms: Negative for: Anxiety; Claustrophobia Medical History: Negative for: Anorexia/bulimia; Confinement Anxiety Musculoskeletal Medical History: Positive for: Rheumatoid Arthritis Negative for: Gout;  Osteoarthritis; Osteomyelitis Oncologic Complaints and Symptoms: No Complaints or Symptoms HBO Extended History Items Eyes: Cataracts Immunizations Pneumococcal Vaccine: Received Pneumococcal Vaccination: No Implantable Devices Family and Social History Former smoker; Marital Status - Widowed; Alcohol Use: Rarely; Drug Use: No History; Caffeine Use: Never; Financial Concerns: No; Food, Clothing or Shelter Needs: No; Support System Lacking: No; Transportation Concerns: No; Advanced Directives: Yes (Not Provided); Do not resuscitate: No; Living Will: Yes (Not Provided); Medical Power of Attorney: Yes Electronic Signature(s) Signed: 10/10/2017 4:34:44 PM By: Roger Shelter Signed: 10/12/2017 9:18:35 AM By: Worthy Keeler PA-C Entered By: Roger Shelter on 10/10/2017 13:35:57 Wanke, [name] ([id number]) -------------------------------------------------------------------------------- SuperBill Details Patient Name: Severt, Kyian S. Date of Service: 10/10/2017 Medical Record Number: [id number] Patient Account Number: [id number] Date of Birth/Sex: 08/25/1924 (84 y.o. Male) Treating RN: Montey Hora Primary Care Provider: Deborra Medina Other Clinician: Referring Provider: Leotis Pain Treating Provider/Extender: Melburn Hake, Kayte Borchard Weeks in Treatment: 0 Diagnosis Coding ICD-10 Codes Code Description E11.621 Type 2 diabetes mellitus with foot ulcer E11.40 Type 2 diabetes mellitus with diabetic neuropathy, unspecified L97.512 Non-pressure chronic ulcer of other part of right foot with fat layer exposed 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 N18.9 Chronic kidney disease, unspecified I73.9 Peripheral vascular disease, unspecified G31.51 Chronic systolic (congestive) heart failure I10 Essential (primary) hypertension Facility Procedures CPT4 Code: 76160737 Description: 99213 - WOUND CARE VISIT-LEV 3 EST  PT Modifier: Quantity: 1 CPT4 Code: [id number] Description: 54627 - DEBRIDE W/O ANES NON SELECT Modifier: Quantity:  1 CPT4 Code: [id number] Description: 07619 - [place] W/O ANES NON SELECT Modifier: Quantity: 1 Physician Procedures CPT4: Description Modifier Quantity Code [id number] 42320 - WC PHYS LEVEL 4 - NEW PT 1 ICD-10 Diagnosis Description E11.621 Type 2 diabetes mellitus with foot ulcer E11.40 Type 2 diabetes mellitus with diabetic neuropathy, unspecified L97.512 Non-pressure  chronic ulcer of other part of right foot with fat layer exposed 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 Electronic Signature(s) Signed: 10/12/2017 9:18:35 AM By: Worthy Keeler PA-C Entered By: Worthy Keeler on 10/12/2017 09:18:12

## 2017-10-12 NOTE — Progress Notes (Signed)
Broecker, ULYSSE SIEMEN ([id number]) Visit Report for 10/10/2017 Abuse/Suicide Risk Screen Details Patient Name: [name], [name]. Date of Service: 10/10/2017 1:00 PM Medical Record Number: [id number] Patient Account Number: [id number] Date of Birth/Sex: 01-Dec-1924 (84 y.o. Male) Treating RN: Roger Shelter Primary Care Grant Swager: Deborra Medina Other Clinician: Referring Brithany Whitworth: Leotis Pain Treating Bintou Lafata/Extender: Melburn Hake, HOYT Weeks in Treatment: 0 Abuse/Suicide Risk Screen Items Answer ABUSE/SUICIDE RISK SCREEN: Has anyone close to you tried to hurt or harm you recentlyo No Do you feel uncomfortable with anyone in your familyo No Has anyone forced you do things that you didnot want to doo No Do you have any thoughts of harming yourselfo No Patient displays signs or symptoms of abuse and/or neglect. No Electronic Signature(s) Signed: 10/10/2017 4:34:44 PM By: Roger Shelter Entered By: Roger Shelter on 10/10/2017 13:36:15 Grulke, [name] ([id number]) -------------------------------------------------------------------------------- Activities of Daily Living Details Patient Name: [name], [name]. Date of Service: 10/10/2017 1:00 PM Medical Record Number: [id number] Patient Account Number: [id number] Date of Birth/Sex: 09-20-1924 (84 y.o. Male) Treating RN: Roger Shelter Primary Care Saman Umstead: Deborra Medina Other Clinician: Referring Analynn Daum: Leotis Pain Treating Elinore Shults/Extender: Melburn Hake, HOYT Weeks in Treatment: 0 Activities of Daily Living Items Answer Activities of Daily Living (Please select one for each item) Drive Automobile Completely Able Take Medications Completely Able Use Telephone Completely Able Care for Appearance Completely Able Use Toilet Completely Able Bath / Shower Completely Able Dress Self Completely Able Feed Self Completely Able Walk Completely Able Get In / Out Bed Completely Able Housework Completely Able Prepare Meals Completely  [place] for Self Completely Able Electronic Signature(s) Signed: 10/10/2017 4:34:44 PM By: Roger Shelter Entered By: Roger Shelter on 10/10/2017 13:36:52 Marek, [name] ([id number]) -------------------------------------------------------------------------------- Education Assessment Details Patient Name: [name], [name] Date of Service: 10/10/2017 1:00 PM Medical Record Number: [id number] Patient Account Number: [id number] Date of Birth/Sex: [birth year] (84 y.o. Male) Treating RN: Roger Shelter Primary Care Chyane Greer: Deborra Medina Other Clinician: Referring Briahnna Harries: Leotis Pain Treating Cari Vandeberg/Extender: Melburn Hake, HOYT Weeks in Treatment: 0 Primary Learner Assessed: Patient Learning Preferences/Education Level/Primary Language Learning Preference: Explanation Highest Education Level: College or Above Preferred Language: English Cognitive Barrier Assessment/Beliefs Language Barrier: No Translator Needed: No Memory Deficit: No Emotional Barrier: No Cultural/Religious Beliefs Affecting Medical Care: No Physical Barrier Assessment Impaired Vision: Yes Glasses Impaired Hearing: No Decreased Hand dexterity: No Knowledge/Comprehension Assessment Knowledge Level: High Comprehension Level: High Ability to understand written High instructions: Ability to understand verbal High instructions: Motivation Assessment Anxiety Level: Calm Cooperation: Cooperative Education Importance: Acknowledges Need Interest in Health Problems: Asks Questions Perception: Coherent Willingness to Engage in Self- High Management Activities: Readiness to Engage in Self- High Management Activities: Electronic Signature(s) Signed: 10/10/2017 4:34:44 PM By: Roger Shelter Entered By: Roger Shelter on 10/10/2017 13:37:38 Broden, [name] ([id number]) -------------------------------------------------------------------------------- Fall Risk Assessment  Details Patient Name: [name], [name] Date of Service: 10/10/2017 1:00 PM Medical Record Number: [id number] Patient Account Number: [id number] Date of Birth/Sex: 11-Jan-1925 (84 y.o. Male) Treating RN: Roger Shelter Primary Care Kandace Elrod: Deborra Medina Other Clinician: Referring Sheikh Leverich: Leotis Pain Treating [place]/Extender: Melburn Hake, HOYT Weeks in Treatment: 0 Fall Risk Assessment Items Have you had 2 or more falls in the last 12 monthso 0 No Have you had any fall that resulted in injury in the last 12 monthso 0 No FALL RISK ASSESSMENT: History of falling - immediate or within 3 months 0 No Secondary diagnosis 0 No Ambulatory aid None/bed rest/wheelchair/nurse 0 No Crutches/cane/walker 0 No Furniture  0 No IV Access/Saline Lock 0 No Gait/Training Normal/bed rest/immobile 0 No Weak 0 No Impaired 0 No Mental Status Oriented to own ability 0 No Electronic Signature(s) Signed: 10/10/2017 4:34:44 PM By: Roger Shelter Entered By: Roger Shelter on 10/10/2017 13:37:46 Meany, [name] ([id number]) -------------------------------------------------------------------------------- Foot Assessment Details Patient Name: [name], [name] Date of Service: 10/10/2017 1:00 PM Medical Record Number: [id number] Patient Account Number: [id number] Date of Birth/Sex: 12/10/1924 (84 y.o. Male) Treating RN: Roger Shelter Primary Care Messina Kosinski: Deborra Medina Other Clinician: Referring Man Effertz: Leotis Pain Treating Tamanna Whitson/Extender: Melburn Hake, HOYT Weeks in Treatment: 0 Foot Assessment Items Site Locations + = Sensation present, - = Sensation absent, C = Callus, U = Ulcer R = Redness, W = Warmth, M = Maceration, PU = Pre-ulcerative lesion F = Fissure, S = Swelling, D = Dryness Assessment Right: Left: Other Deformity: No No Prior Foot Ulcer: No No Prior Amputation: No No Charcot Joint: No No Ambulatory Status: Non-ambulatory Assistance Device: Wheelchair Gait: Electronic  Signature(s) Signed: 10/10/2017 4:34:44 PM By: Roger Shelter Entered By: Roger Shelter on 10/10/2017 13:46:10 [place], [name] ([id number]) -------------------------------------------------------------------------------- Nutrition Risk Assessment Details Patient Name: [name], [name] Date of Service: 10/10/2017 1:00 PM Medical Record Number: [id number] Patient Account Number: [id number] Date of Birth/Sex: 06-21-1924 (84 y.o. Male) Treating RN: Roger Shelter Primary Care Naomi Fitton: Deborra Medina Other Clinician: Referring Gerilynn Mccullars: Leotis Pain Treating Latecia Miler/Extender: STONE III, HOYT Weeks in Treatment: 0 Height (in): Weight (lbs): Body Mass Index (BMI): Nutrition Risk Assessment Items NUTRITION RISK SCREEN: I have an illness or condition that made me change the kind and/or amount of 0 No food I eat I eat fewer than two meals per day 0 No I eat few fruits and vegetables, or milk products 0 No I have three or more drinks of beer, liquor or wine almost every day 0 No I have tooth or mouth problems that make it hard for me to eat 0 No I don't always have enough money to buy the food I need 0 No I eat alone most of the time 0 No I take three or more different prescribed or over-the-counter drugs a day 0 No Without wanting to, I have lost or gained 10 pounds in the last six months 0 No I am not always physically able to shop, cook and/or feed myself 0 No Nutrition Protocols Good Risk Protocol 0 No interventions needed Moderate Risk Protocol Electronic Signature(s) Signed: 10/10/2017 4:34:44 PM By: Roger Shelter Entered By: Roger Shelter on 10/10/2017 13:37:51

## 2017-10-13 ENCOUNTER — Ambulatory Visit (INDEPENDENT_AMBULATORY_CARE_PROVIDER_SITE_OTHER): Payer: Medicare Other | Admitting: Internal Medicine

## 2017-10-13 ENCOUNTER — Encounter (INDEPENDENT_AMBULATORY_CARE_PROVIDER_SITE_OTHER): Payer: Self-pay

## 2017-10-13 ENCOUNTER — Encounter: Payer: Self-pay | Admitting: Internal Medicine

## 2017-10-13 VITALS — BP 136/60 | HR 70 | Temp 98.2°F | Resp 15 | Ht 67.0 in | Wt 183.8 lb

## 2017-10-13 DIAGNOSIS — M79671 Pain in right foot: Secondary | ICD-10-CM | POA: Diagnosis not present

## 2017-10-13 DIAGNOSIS — M79605 Pain in left leg: Secondary | ICD-10-CM

## 2017-10-13 DIAGNOSIS — E1141 Type 2 diabetes mellitus with diabetic mononeuropathy: Secondary | ICD-10-CM | POA: Diagnosis not present

## 2017-10-13 DIAGNOSIS — M79604 Pain in right leg: Secondary | ICD-10-CM

## 2017-10-13 DIAGNOSIS — M79672 Pain in left foot: Secondary | ICD-10-CM

## 2017-10-13 DIAGNOSIS — M869 Osteomyelitis, unspecified: Secondary | ICD-10-CM | POA: Diagnosis not present

## 2017-10-13 DIAGNOSIS — Z7189 Other specified counseling: Secondary | ICD-10-CM

## 2017-10-13 DIAGNOSIS — I7025 Atherosclerosis of native arteries of other extremities with ulceration: Secondary | ICD-10-CM | POA: Diagnosis not present

## 2017-10-13 DIAGNOSIS — E1351 Other specified diabetes mellitus with diabetic peripheral angiopathy without gangrene: Secondary | ICD-10-CM | POA: Diagnosis not present

## 2017-10-13 DIAGNOSIS — R269 Unspecified abnormalities of gait and mobility: Secondary | ICD-10-CM | POA: Diagnosis not present

## 2017-10-13 MED ORDER — HYDROCODONE-ACETAMINOPHEN 10-325 MG PO TABS: 1.0000 | ORAL_TABLET | Freq: Four times a day (QID) | ORAL | 0 refills | Status: DC | PRN

## 2017-10-13 MED ORDER — TRAMADOL HCL 50 MG PO TABS: 50.0000 mg | ORAL_TABLET | Freq: Every evening | ORAL | 0 refills | Status: DC | PRN

## 2017-10-13 NOTE — Patient Instructions (Addendum)
Take a tramadol alternating with a vicodin   every 4 to 6 hours   For your pain  Continue tylenol 500 mg every 6  hours   I agree that a rollator and a wheelchair for now would be needed    We will ask [facility] to help [place] get you a motorized scooter

## 2017-10-13 NOTE — Progress Notes (Signed)
Marines, DEMARRI [name] ([id number]) Visit Report for 10/10/2017 Allergy List Details Patient Name: Stengel, [name]. Date of Service: 10/10/2017 1:00 PM Medical Record Number: [id number] Patient Account Number: [id number] Date of Birth/Sex: 1924-05-23 (84 y.o. Male) Treating RN: Roger Shelter Primary Care Bernal Luhman: Deborra Medina Other Clinician: Referring Trapper Meech: Leotis Pain Treating Tyreisha Ungar/Extender: STONE III, HOYT Weeks in Treatment: 0 Allergies Active Allergies Vicodin tramadol Lyrica trazodone nefazodone Allergy Notes Electronic Signature(s) Signed: 10/10/2017 4:34:44 PM By: Roger Shelter Entered By: Roger Shelter on 10/10/2017 14:10:17 Falwell, [name] ([id number]) -------------------------------------------------------------------------------- Arrival Information Details Patient Name: Labrador, [name] Date of Service: 10/10/2017 1:00 PM Medical Record Number: [id number] Patient Account Number: [id number] Date of Birth/Sex: October 21, 1924 (84 y.o. Male) Treating RN: Roger Shelter Primary Care Araiya Tilmon: Deborra Medina Other Clinician: Referring Naseer Hearn: Leotis Pain Treating Shady Bradish/Extender: Melburn Hake, HOYT Weeks in Treatment: 0 Visit Information Patient Arrived: Wheel Chair Arrival Time: 12:56 Accompanied By: son and daughter Kathlee Nations Transfer Assistance: EasyPivot Patient Lift Patient Identification Verified: Yes Secondary Verification Process Yes Completed: Electronic Signature(s) Signed: 10/10/2017 4:34:44 PM By: Roger Shelter Entered By: Roger Shelter on 10/10/2017 12:57:20 Starkman, [name] ([id number]) -------------------------------------------------------------------------------- Clinic Level of Care Assessment Details Patient Name: Nass, [name]. Date of Service: 10/10/2017 1:00 PM Medical Record Number: [id number] Patient Account Number: [id number] Date of Birth/Sex: [birth year] (84 y.o. Male) Treating RN: Montey Hora Primary Care Raena Pau:  Deborra Medina Other Clinician: Referring Joda Braatz: Leotis Pain Treating Ciclaly Mulcahey/Extender: Melburn Hake, HOYT Weeks in Treatment: 0 Clinic Level of Care Assessment Items TOOL 1 Quantity Score []  - Use when EandM and Procedure is performed on INITIAL visit 0 ASSESSMENTS - Nursing Assessment / Reassessment X - General Physical Exam (combine w/ comprehensive assessment (listed just below) when 1 20 performed on new pt. evals) X- 1 25 Comprehensive Assessment (HX, ROS, Risk Assessments, Wounds Hx, etc.) ASSESSMENTS - Wound and Skin Assessment / Reassessment []  - Dermatologic / Skin Assessment (not related to wound area) 0 ASSESSMENTS - Ostomy and/or Continence Assessment and Care []  - Incontinence Assessment and Management 0 []  - 0 Ostomy Care Assessment and Management (repouching, etc.) PROCESS - Coordination of Care X - Simple Patient / Family Education for ongoing care 1 15 []  - 0 Complex (extensive) Patient / Family Education for ongoing care X- 1 10 Staff obtains Programmer, systems, Records, Test Results / Process Orders []  - 0 Staff telephones HHA, Nursing Homes / Clarify orders / etc []  - 0 Routine Transfer to another Facility (non-emergent condition) []  - 0 Routine Hospital Admission (non-emergent condition) X- 1 15 New Admissions / Biomedical engineer / Ordering NPWT, Apligraf, etc. []  - 0 Emergency Hospital Admission (emergent condition) PROCESS - Special Needs []  - Pediatric / Minor Patient Management 0 []  - 0 Isolation Patient Management []  - 0 Hearing / Language / Visual special needs []  - 0 Assessment of Community assistance (transportation, D/C planning, etc.) []  - 0 Additional assistance / Altered mentation []  - 0 Support Surface(s) Assessment (bed, cushion, seat, etc.) Rampersad, Hilman S. ([id number]) INTERVENTIONS - Miscellaneous []  - External ear exam 0 []  - 0 Patient Transfer (multiple staff / Civil Service fast streamer / Similar devices) []  - 0 Simple Staple / Suture  removal (25 or less) []  - 0 Complex Staple / Suture removal (26 or more) []  - 0 Hypo/Hyperglycemic Management (do not check if billed separately) []  - 0 Ankle / Brachial Index (ABI) - do not check if billed separately Has the patient been seen at the hospital within the last three years: Yes Total Score:  85 Level Of Care: New/Established - Level 3 Electronic Signature(s) Signed: 10/10/2017 5:28:07 PM By: Montey Hora Entered By: Montey Hora on 10/10/2017 14:32:58 Pegg, [name] ([id number]) -------------------------------------------------------------------------------- Lower Extremity Assessment Details Patient Name: Baltz, [name] Date of Service: 10/10/2017 1:00 PM Medical Record Number: [id number] Patient Account Number: [id number] Date of Birth/Sex: 1924-08-17 (84 y.o. Male) Treating RN: Roger Shelter Primary Care Lexine Jaspers: Deborra Medina Other Clinician: Referring Jujhar Everett: Leotis Pain Treating Seriyah Collison/Extender: Melburn Hake, HOYT Weeks in Treatment: 0 Edema Assessment Assessed: [Left: No] [Right: No] Edema: [Left: Yes] [Right: Yes] Calf Left: Right: Point of Measurement: 34 cm From Medial Instep 36 cm 35 cm Ankle Left: Right: Point of Measurement: 10 cm From Medial Instep 24.5 cm 27 cm Vascular Assessment Claudication: Claudication Assessment [Left:None] [Right:None] Pulses: Dorsalis Pedis Palpable: [Left:Yes] [Right:Yes] Posterior Tibial Extremity colors, hair growth, and conditions: Extremity Color: [Left:Normal] [Right:Normal] Hair Growth on Extremity: [Left:No] [Right:No] Temperature of Extremity: [Left:Warm] [Right:Warm] Capillary Refill: [Left:< 3 seconds] [Right:< 3 seconds] Toe Nail Assessment Left: Right: Thick: No No Discolored: No No Deformed: No Yes Improper Length and Hygiene: No No Electronic Signature(s) Signed: 10/10/2017 4:34:44 PM By: Roger Shelter Entered By: Roger Shelter on 10/10/2017 13:50:30 Zabawa, [name]  ([id number]) -------------------------------------------------------------------------------- Multi Wound Chart Details Patient Name: Haji, [name] Date of Service: 10/10/2017 1:00 PM Medical Record Number: [id number] Patient Account Number: [id number] Date of Birth/Sex: May 19, 1924 (84 y.o. Male) Treating RN: Montey Hora Primary Care Annaliz Aven: Deborra Medina Other Clinician: Referring Timmya Blazier: Leotis Pain Treating Vietta Bonifield/Extender: Melburn Hake, HOYT Weeks in Treatment: 0 Vital Signs Height(in): Pulse(bpm): 10 Weight(lbs): Blood Pressure(mmHg): 131/40 Body Mass Index(BMI): Temperature(F): 98.1 Respiratory Rate 18 (breaths/min): Photos: [1:No Photos] [2:No Photos] [N/A:N/A] Wound Location: [1:Right Toe Second] [2:Left Toe Third] [N/A:N/A] Wounding Event: [1:Gradually Appeared] [2:Gradually Appeared] [N/A:N/A] Primary Etiology: [1:Diabetic Wound/Ulcer of the Lower Extremity] [2:Diabetic Wound/Ulcer of the Lower Extremity] [N/A:N/A] Comorbid History: [1:Cataracts, Lymphedema, Coronary Artery Disease, Hypertension, Peripheral Venous Disease, Type II Diabetes, End Stage Renal Disease, Rheumatoid Arthritis, Neuropathy] [2:Cataracts, Lymphedema, Coronary Artery Disease, Hypertension,  Peripheral Venous Disease, Type II Diabetes, End Stage Renal Disease, Rheumatoid Arthritis, Neuropathy] [N/A:N/A] Date Acquired: [1:06/04/2017] [2:06/04/2017] [N/A:N/A] Weeks of Treatment: [1:0] [2:0] [N/A:N/A] Wound Status: [1:Open] [2:Open] [N/A:N/A] Measurements L x W x D [1:1.5x1x0.2] [2:2.5x2.2x0.2] [N/A:N/A] (cm) Area (cm) : [1:1.178] [2:4.32] [N/A:N/A] Volume (cm) : [1:0.236] [2:0.864] [N/A:N/A] Starting Position 1 [1:11] (o'clock): Ending Position 1 [1:1] (o'clock): Maximum Distance 1 (cm): [1:0.2] Undermining: [1:Yes] [2:No] [N/A:N/A] Classification: [1:Grade 2] [2:Grade 2] [N/A:N/A] Exudate Amount: [1:Small] [2:Small] [N/A:N/A] Exudate Type: [1:Serosanguineous] [2:Serosanguineous]  [N/A:N/A] Exudate Color: [1:red, brown] [2:red, brown] [N/A:N/A] Wound Margin: [1:Distinct, outline attached] [2:Indistinct, nonvisible] [N/A:N/A] Granulation Amount: [1:Small (1-33%)] [2:Small (1-33%)] [N/A:N/A] Granulation Quality: [1:Red, Pink] [2:Pink] [N/A:N/A] Necrotic Amount: [1:Large (67-100%)] [2:Large (67-100%)] [N/A:N/A] Necrotic Tissue: [1:Eschar, Adherent Slough] [2:Eschar, Adherent Slough] [N/A:N/A] Exposed Structures: [1:Fat Layer (Subcutaneous Tissue) Exposed: Yes Fascia: No] [2:Fat Layer (Subcutaneous Tissue) Exposed: Yes Fascia: No] [N/A:N/A] Tendon: No Tendon: No Muscle: No Muscle: No Joint: No Joint: No Bone: No Bone: No Epithelialization: None None N/A Periwound Skin Texture: Excoriation: No Excoriation: No N/A Induration: No Induration: No Callus: No Callus: No Crepitus: No Crepitus: No Rash: No Rash: No Scarring: No Scarring: No Periwound Skin Moisture: Maceration: No Maceration: No N/A Dry/Scaly: No Dry/Scaly: No Periwound Skin Color: Atrophie Blanche: No Atrophie Blanche: No N/A Cyanosis: No Cyanosis: No Ecchymosis: No Ecchymosis: No Erythema: No Erythema: No Hemosiderin Staining: No Hemosiderin Staining: No Mottled: No Mottled: No Pallor: No Pallor: No Rubor: No Rubor: No Temperature:  No Abnormality No Abnormality N/A Tenderness on Palpation: Yes Yes N/A Wound Preparation: Ulcer Cleansing: Ulcer Cleansing: N/A Rinsed/Irrigated with Saline Rinsed/Irrigated with Saline Topical Anesthetic Applied: Topical Anesthetic Applied: Other: lidocaince 4% Other: lidocaince 4% Treatment Notes Electronic Signature(s) Signed: 10/10/2017 5:28:07 PM By: Montey Hora Entered By: Montey Hora on 10/10/2017 14:16:31 Watters, [name] ([id number]) -------------------------------------------------------------------------------- [place] Details Patient Name: Steadham, Clete S. Date of Service: 10/10/2017 1:00 PM Medical  Record Number: [id number] Patient Account Number: [id number] Date of Birth/Sex: October 28, 1924 (84 y.o. Male) Treating RN: Montey Hora Primary Care Amarea Macdowell: Deborra Medina Other Clinician: Referring Justa Hatchell: Leotis Pain Treating Charnika Herbst/Extender: Melburn Hake, HOYT Weeks in Treatment: 0 Active Inactive ` Abuse / Safety / Falls / Self Care Management Nursing Diagnoses: Potential for falls Goals: Patient will remain injury free related to falls Date Initiated: 10/10/2017 Target Resolution Date: 01/02/2018 Goal Status: Active Interventions: Assess fall risk on admission and as needed Notes: ` Orientation to the Wound Care Program Nursing Diagnoses: Knowledge deficit related to the wound healing center program Goals: Patient/caregiver will verbalize understanding of the [place] Program Date Initiated: 10/10/2017 Target Resolution Date: 01/02/2018 Goal Status: Active Interventions: Provide education on orientation to the wound center Notes: ` Pain, Acute or Chronic Nursing Diagnoses: Potential alteration in comfort, pain Goals: Patient/caregiver will verbalize adequate pain control between visits Date Initiated: 10/10/2017 Target Resolution Date: 01/02/2018 Goal Status: Active Interventions: [place], SYNCERE EBLE ([id number]) Complete pain assessment as per visit requirements Notes: ` Wound/Skin Impairment Nursing Diagnoses: Impaired tissue integrity Goals: Ulcer/skin breakdown will heal within 14 weeks Date Initiated: 10/10/2017 Target Resolution Date: 01/02/2018 Goal Status: Active Interventions: Assess patient/caregiver ability to obtain necessary supplies Assess patient/caregiver ability to perform ulcer/skin care regimen upon admission and as needed Assess ulceration(s) every visit Notes: Electronic Signature(s) Signed: 10/10/2017 5:28:07 PM By: Montey Hora Entered By: Montey Hora on 10/10/2017 14:13:03 Kumagai, [name]  ([id number]) -------------------------------------------------------------------------------- Pain Assessment Details Patient Name: Mcquade, Duey S. Date of Service: 10/10/2017 1:00 PM Medical Record Number: [id number] Patient Account Number: [id number] Date of Birth/Sex: 1924-05-12 (84 y.o. Male) Treating RN: Roger Shelter Primary Care Hilmar Moldovan: Deborra Medina Other Clinician: Referring Ted Goodner: Leotis Pain Treating Quintessa Simmerman/Extender: Melburn Hake, HOYT Weeks in Treatment: 0 Active Problems Location of Pain Severity and Description of Pain Patient Has Paino Yes Site Locations Pain Location: Generalized Pain Duration of the Pain. Constant / Intermittento Intermittent Rate the pain. Current Pain Level: 2 Worst Pain Level: 10 Character of Pain Describe the Pain: Shooting, Stabbing Pain Management and Medication Current Pain Management: Electronic Signature(s) Signed: 10/10/2017 4:34:44 PM By: Roger Shelter Entered By: Roger Shelter on 10/10/2017 13:47:09 Savastano, [name] ([id number]) -------------------------------------------------------------------------------- Wound Assessment Details Patient Name: Dombrosky, Davarious S. Date of Service: 10/10/2017 1:00 PM Medical Record Number: [id number] Patient Account Number: [id number] Date of Birth/Sex: 04-Feb-1925 (84 y.o. Male) Treating RN: Roger Shelter Primary Care Cassanda Walmer: Deborra Medina Other Clinician: Referring Izea Livolsi: Leotis Pain Treating Oseph Imburgia/Extender: STONE III, HOYT Weeks in Treatment: 0 Wound Status Wound Number: 1 Primary Diabetic Wound/Ulcer of the Lower Extremity Etiology: Wound Location: Right Toe Second Secondary Arterial Insufficiency Ulcer Wounding Event: Gradually Appeared Etiology: Date Acquired: 06/04/2017 Wound Open Weeks Of Treatment: 0 Status: Clustered Wound: No Comorbid Cataracts, Lymphedema, Coronary Artery Pending Amputation On Presentation History: Disease, Hypertension, Peripheral  Venous Disease, Type II Diabetes, End Stage Renal Disease, Rheumatoid Arthritis, Neuropathy Photos Wound Measurements Length: (cm) 1.5 % Reduction Width: (cm) 1 % Reduction Depth: (cm) 0.2 Epithelializ Area: (cm) 1.178 Tunneling: Volume: (cm) 0.236 Undermining Starting  Ending Po Maximum D in Area: 0% in Volume: 0% ation: None No : Yes Position (o'clock): 11 sition (o'clock): 1 istance: (cm) 0.2 Wound Description Classification: Grade 2 Foul Odor Af Wound Margin: Distinct, outline attached Slough/Fibri Exudate Amount: Small Exudate Type: Serosanguineous Exudate Color: red, brown ter Cleansing: No no Yes Wound Bed Granulation Amount: Small (1-33%) Exposed Structure Granulation Quality: Red, Pink Fascia Exposed: No Necrotic Amount: Large (67-100%) Fat Layer (Subcutaneous Tissue) Exposed: Yes Fitzsimmons, Sufian S. ([id number]) Necrotic Quality: Eschar, Adherent Slough Tendon Exposed: No Muscle Exposed: No Joint Exposed: No Bone Exposed: No Periwound Skin Texture Texture Color No Abnormalities Noted: No No Abnormalities Noted: No Callus: No Atrophie Blanche: No Crepitus: No Cyanosis: No Excoriation: No Ecchymosis: No Induration: No Erythema: No Rash: No Hemosiderin Staining: No Scarring: No Mottled: No Pallor: No Moisture Rubor: No No Abnormalities Noted: No Dry / Scaly: No Temperature / Pain Maceration: No Temperature: No Abnormality Tenderness on Palpation: Yes Wound Preparation Ulcer Cleansing: Rinsed/Irrigated with Saline Topical Anesthetic Applied: Other: lidocaince 4%, Electronic Signature(s) Signed: 10/10/2017 4:25:59 PM By: Roger Shelter Previous Signature: 10/10/2017 2:13:53 PM Version By: Roger Shelter Entered By: Roger Shelter on 10/10/2017 16:25:59 Lansberry, [name] ([id number]) -------------------------------------------------------------------------------- Wound Assessment Details Patient Name: Etzler, Miken S. Date of  Service: 10/10/2017 1:00 PM Medical Record Number: [id number] Patient Account Number: [id number] Date of Birth/Sex: September 17, 1924 (84 y.o. Male) Treating RN: Roger Shelter Primary Care Nysia Dell: Deborra Medina Other Clinician: Referring Fotios Amos: Leotis Pain Treating Crucita Lacorte/Extender: STONE III, HOYT Weeks in Treatment: 0 Wound Status Wound Number: 2 Primary Diabetic Wound/Ulcer of the Lower Extremity Etiology: Wound Location: Left Toe Third Secondary Arterial Insufficiency Ulcer Wounding Event: Gradually Appeared Etiology: Date Acquired: 06/04/2017 Wound Open Weeks Of Treatment: 0 Status: Clustered Wound: No Comorbid Cataracts, Lymphedema, Coronary Artery Pending Amputation On Presentation History: Disease, Hypertension, Peripheral Venous Disease, Type II Diabetes, End Stage Renal Disease, Rheumatoid Arthritis, Neuropathy Photos Wound Measurements Length: (cm) 2.5 % Reduction i Width: (cm) 2.2 % Reduction i Depth: (cm) 0.2 Epithelializa Area: (cm) 4.32 Tunneling: Volume: (cm) 0.864 Undermining: n Area: 0% n Volume: 0% tion: None No No Wound Description Classification: Grade 2 Foul Odor Aft Wound Margin: Indistinct, nonvisible Slough/Fibrin Exudate Amount: Small Exudate Type: Serosanguineous Exudate Color: red, brown er Cleansing: No o Yes Wound Bed Granulation Amount: Small (1-33%) Exposed Structure Granulation Quality: Pink Fascia Exposed: No Necrotic Amount: Large (67-100%) Fat Layer (Subcutaneous Tissue) Exposed: Yes Necrotic Quality: Eschar, Adherent Slough Tendon Exposed: No Muscle Exposed: No Joint Exposed: No Bone Exposed: No Clemenson, Hanz S. ([id number]) Periwound Skin Texture Texture Color No Abnormalities Noted: No No Abnormalities Noted: No Callus: No Atrophie Blanche: No Crepitus: No Cyanosis: No Excoriation: No Ecchymosis: No Induration: No Erythema: No Rash: No Hemosiderin Staining: No Scarring: No Mottled: No Pallor:  No Moisture Rubor: No No Abnormalities Noted: No Dry / Scaly: No Temperature / Pain Maceration: No Temperature: No Abnormality Tenderness on Palpation: Yes Wound Preparation Ulcer Cleansing: Rinsed/Irrigated with Saline Topical Anesthetic Applied: Other: lidocaince 4%, Electronic Signature(s) Signed: 10/10/2017 4:26:26 PM By: Roger Shelter Previous Signature: 10/10/2017 2:15:22 PM Version By: Roger Shelter Entered By: Roger Shelter on 10/10/2017 16:26:26 Zenz, [name] ([id number]) -------------------------------------------------------------------------------- Vitals Details Patient Name: Degenhart, Kamarion S. Date of Service: 10/10/2017 1:00 PM Medical Record Number: [id number] Patient Account Number: [id number] Date of Birth/Sex: 03/15/1924 (84 y.o. Male) Treating RN: Roger Shelter Primary Care Tellis Spivak: Deborra Medina Other Clinician: Referring Ahmari Duerson: Leotis Pain Treating Rhondalyn Clingan/Extender: STONE III, HOYT Weeks in Treatment: 0 Vital Signs Time Taken: 13:07  Temperature (F): 98.1 Pulse (bpm): 64 Respiratory Rate (breaths/min): 18 Blood Pressure (mmHg): 131/40 Reference Range: 80 - 120 mg / dl Electronic Signature(s) Signed: 10/10/2017 4:34:44 PM By: Roger Shelter Entered By: Roger Shelter on 10/10/2017 13:24:03

## 2017-10-13 NOTE — Progress Notes (Signed)
Subjective:  Patient ID: [name], male    DOB: 1924-12-02  Age: 84 y.o. MRN: [id number]  CC: The primary encounter diagnosis was Altered gait. Diagnoses of Osteomyelitis of foot, unspecified laterality, unspecified type ([place]), Atherosclerosis of native arteries of the extremities with ulceration ([place]), Bilateral pain of leg and foot, Diabetic mononeuropathy associated with type 2 diabetes mellitus ([place]), Peripheral vascular disease due to secondary diabetes ([place]), and Do not resuscitate discussion were also pertinent to this visit.  HPI [name] presents for follow up on severe pain involving his feet.  Patient is accompanied by 2 daughters today who have many concerns and questions about the nature of his pain , the treatment plan, as well as recent and upcoming procedures and referrals that have been made .  A total of 60 minutes was spent with patient and family in face to face time today   History: Patient  has a history of diabetic neuropathy complicated by PVD.  He is  s/p R  Fem-pop bypass and stent of Right T/p trunk in 2011.  His pain was previously managed well with gabapentin but for the last several months has been unrelenting and multiple trials of medications including opioid analogues and opioids have been problematic.    He underwent PTCA of left peroneal artery  And left  tibioperoneal trunk  Followed by  PTCA  And stent of the mid left SFA for >50% stenosis after angioplasty on August 18 2017 by [facility]  (per report in [place])  after he was referred  By Albertine Patricia, Podiatry for a nonhealing diabetic foot ulcer involving the 3rd toe on the left  foot.  He has subsequently  developed an ulcer on the 2nd toe ,  Right  foot.  His circulation was reevaluated in  August by Lucky Cowboy due to worsening pain in BOTH feet  and digital waveforms of both feet were done with dampened waveforms noted in all toes bilaterally .  He was referred to Wound Care by Dr . Lucky Cowboy.  He received  treatment in  ER at [facility] on August 30  for cellulitis of the right leg . Due to the threat of inclement weather his family procured an appt with a wound care center in [place] on Sept 5,  And ABIS were done and  reportedly 0.37 bilaterally (gleaned from [place] Sept 6  OFFICE NOTE  ) followed by the scheduled initial  evaluation at the [facility]  at [facility] on Sept 6.  Per family , both specialists voiced concern  over possible  osteomyelitis  Involving the right foot and now also the left. Marland Kitchen    MRI and CT scan were recommended  But both are  contraindicated due to 1) pacemaker and 2) CKD with need for use of contrast on upcoming angiogram Sept 16 by Dew.    Notes from the [facility] center are not available.  [place] notes are available but due to formatting are extremely long and difficult to  Decipher any plan,  But it does not appear that bone scan was offered .     Patient is here for management of his pain and coordination of care .  He has not tolerated previous trial of  tramadol or vicodin due to development of visual hallucinations . Trial of higher doses of gabapentin followed by Lyrica caused dizziness and weakness.  Referral to palliative care was requested,  And ultimately a referral was made to [facility] since they can provide both wound care AND palliation   [facility] ordered [employer] with [facility]  Who has already been out to the house once.   THIS WILL BE CANCELLED in light of the above need for wound care AND pain manageent s.  Patient and Family are requesting discussion of  options for pain management including acupuncture,   a nerve block, opioids, and finally medical marijuana if all else fails.   Family is requesting discussion of the risks and benefits of  long term antibiotic use vs surgical amputation,  And  wanting to ensure that if amputation  is needed they are requesting that surgery be done  at [facility] since the closest  family member who  could be present and available post operatively would be a son who lives in [place] .  They are requesting PT, home health,  Geriatric care management to coordinate everything,  A rolling walker, and a motorized scooter .  also requesting  DNR discussion and forms.   He has not seen neurology yet.  Neurology  Referral was made several weeks ago during attempts to manage pain, as it is unclear whether his current severe pain is due to neuropathy or ischemia.    Outpatient Medications Prior to Visit  Medication Sig Dispense Refill  . amLODipine (NORVASC) 10 MG tablet TAKE 1 TABLET (10 MG TOTAL) BY MOUTH DAILY. 90 tablet 2  . aspirin 325 MG tablet Take 325 mg by mouth daily.      Marland Kitchen atorvastatin (LIPITOR) 20 MG tablet TAKE 1 TABLET (20 MG TOTAL) BY MOUTH DAILY. 90 tablet 1  . Calcium Carbonate-Vitamin D (CALCIUM + D) 600-200 MG-UNIT TABS Take 1 tablet by mouth 2 (two) times daily.      . cephALEXin (KEFLEX) 250 MG capsule Take 1 capsule (250 mg total) by mouth 4 (four) times daily for 10 days. 40 capsule 0  . clopidogrel (PLAVIX) 75 MG tablet TAKE 1 TABLET (75 MG TOTAL) BY MOUTH DAILY. 90 tablet 1  . collagenase (SANTYL) ointment Apply to wound twice a day 30 g 1  . cyanocobalamin (,VITAMIN B-12,) 1000 MCG/ML injection INJECT 1 ML (1,000 MCG TOTAL) INTO THE MUSCLE EVERY 30 (THIRTY) DAYS. 3 mL 3  . fenofibrate (TRICOR) 48 MG tablet TAKE 1 TABLET BY MOUTH DAILY 90 tablet 1  . furosemide (LASIX) 20 MG tablet Take 2 tablets (40 mg total) by mouth daily. AS NEEDED FOR FLUID RETENTION 180 tablet 1  . gabapentin (NEURONTIN) 300 MG capsule TAKE 2 CAPSULES (600 MG TOTAL) BY MOUTH 3 (THREE) TIMES DAILY. 180 capsule 2  . glucose blood (ACCU-CHEK AVIVA PLUS) test strip CHECK BLOOD SUGAR 3 TIMES A DAY AS NEEDED DX E11.51 300 each 5  . hydrocortisone 2.5 % cream Apply 1 application topically 2 (two) times daily as needed.    . insulin aspart protamine - aspart (NOVOLOG MIX 70/30 FLEXPEN) (70-30) 100 UNIT/ML  FlexPen INJECT 40 units two times daily , adjust dose using sliding scale 75 pen 1  . Insulin Pen Needle (B-D ULTRAFINE III SHORT PEN) 31G X 8 MM MISC USE AS DIRECTED 3 TIMES A DAY 300 each 5  . Insulin Syringe-Needle U-100 (B-D INS SYRINGE 0.5CC/31GX5/16) 31G X 5/16" 0.5 ML MISC 1 application by Does not apply route 3 (three) times daily before meals. 300 each 3  . latanoprost (XALATAN) 0.005 % ophthalmic solution Place 1 drop into both eyes at bedtime.  3  . losartan (COZAAR)  100 MG tablet TAKE 1 TABLET BY MOUTH EVERY DAY 90 tablet 1  . metoprolol tartrate (LOPRESSOR) 100 MG tablet TAKE 1 TABLET BY MOUTH TWICE A DAY 180 tablet 1  . Multiple Vitamin (MULTIVITAMIN) tablet Take 1 tablet by mouth daily.    . pregabalin (LYRICA) 50 MG capsule Take 1 capsule (50 mg total) by mouth 3 (three) times daily. 90 capsule 4  . Syringe, Disposable, 3 ML MISC For use with B12 injections weekly/monthly 25 each 0  . SYRINGE-NEEDLE, DISP, 3 ML (B-D 3CC LUER-LOK SYR 25GX1") 25G X 1" 3 ML MISC FOR USE WITH B12 INJECTIONS WEEKLY/MONTHLY 50 each 1  . traMADol (ULTRAM) 50 MG tablet Take 1 tablet (50 mg total) by mouth at bedtime as needed. 30 tablet 0   No facility-administered medications prior to visit.     Review of Systems;  Patient denies headache, fevers, malaise, unintentional weight loss, skin rash, eye pain, sinus congestion and sinus pain, sore throat, dysphagia,  hemoptysis , cough, dyspnea, wheezing, chest pain, palpitations, orthopnea, edema, abdominal pain, nausea, melena, diarrhea, constipation, flank pain, dysuria, hematuria, urinary  Frequency, nocturia, numbness, tingling, seizures,  Focal weakness, Loss of consciousness,  Tremor, insomnia, depression, anxiety, and suicidal ideation.      Objective:  BP 136/60 (BP Location: Left Arm, Patient Position: Sitting, Cuff Size: Normal)   Pulse 70   Temp 98.2 F (36.8 C) (Oral)   Resp 15   Ht 5\' 7"  (1.702 m)   Wt 183 lb 12.8 oz (83.4 kg)   SpO2 97%    BMI 28.79 kg/m   BP Readings from Last 3 Encounters:  10/13/17 136/60  10/03/17 137/61  09/19/17 120/66    Wt Readings from Last 3 Encounters:  10/13/17 183 lb 12.8 oz (83.4 kg)  10/03/17 172 lb (78 kg)  09/19/17 177 lb (80.3 kg)    General appearance: alert, cooperative and appears stated age. HE IS IN PAIN   Neck: no adenopathy, no carotid bruit, supple, symmetrical, trachea midline and thyroid not enlarged, symmetric, no tenderness/mass/nodules lungs: clear to auscultation bilaterally Heart: regular rate and rhythm, S1, S2 normal, no murmur, click, rub or gallop  Pulses: nonpalpable DPs Skin:  Bilateral foot ulcers , edema   Lymph nodes: Cervical, supraclavicular, and axillary nodes normal.  Lab Results  Component Value Date   HGBA1C 6.8 (H) 07/09/2017   HGBA1C 7.5 (H) 04/07/2017   HGBA1C 7.1 (H) 10/09/2016    Lab Results  Component Value Date   CREATININE 2.19 (H) 10/03/2017   CREATININE 2.01 (H) 09/10/2017   CREATININE 2.07 (H) 08/18/2017    Lab Results  Component Value Date   WBC 9.4 10/03/2017   HGB 10.6 (L) 10/03/2017   HCT 31.0 (L) 10/03/2017   PLT 212 10/03/2017   GLUCOSE 115 (H) 10/03/2017   CHOL 102 04/07/2017   TRIG 119.0 04/07/2017   HDL 34.00 (L) 04/07/2017   LDLDIRECT 49.0 06/26/2015   LDLCALC 44 04/07/2017   ALT 20 10/03/2017   AST 19 10/03/2017   NA 137 10/03/2017   K 4.2 10/03/2017   CL 107 10/03/2017   CREATININE 2.19 (H) 10/03/2017   BUN 88 (H) 10/03/2017   CO2 22 10/03/2017   TSH 1.49 06/13/2014   HGBA1C 6.8 (H) 07/09/2017   MICROALBUR 3.1 (H) 04/07/2017    Dg Ankle Complete Right  Result Date: 10/03/2017 CLINICAL DATA:  Swelling RIGHT lower leg for 2 days, redness, history diabetes mellitus, hypertension, coronary artery disease, peripheral arterial disease EXAM: RIGHT ANKLE -  COMPLETE 3+ VIEW COMPARISON:  None FINDINGS: Scattered soft tissue swelling RIGHT lower leg and ankle especially medial ankle. Osseous mineralization  normal. Joint spaces preserved. Small plantar calcaneal spur. No acute fracture, dislocation, or bone destruction. Extensive small vessel vascular calcifications at the RIGHT lower leg into RIGHT foot. IMPRESSION: Soft tissue swelling and extensive small vessel vascular calcifications without acute osseous abnormalities. Electronically Signed   By: Lavonia Dana M.D.   On: 10/03/2017 10:34   US Venous Img Lower Unilateral Right  Result Date: 10/03/2017 CLINICAL DATA:  84 year old male with a history of leg swelling EXAM: RIGHT LOWER EXTREMITY VENOUS DOPPLER ULTRASOUND TECHNIQUE: Gray-scale sonography with graded compression, as well as color Doppler and duplex ultrasound were performed to evaluate the lower extremity deep venous systems from the level of the common femoral vein and including the common femoral, femoral, profunda femoral, popliteal and calf veins including the posterior tibial, peroneal and gastrocnemius veins when visible. The superficial great saphenous vein was also interrogated. Spectral Doppler was utilized to evaluate flow at rest and with distal augmentation maneuvers in the common femoral, femoral and popliteal veins. COMPARISON:  None. FINDINGS: Contralateral Common Femoral Vein: Respiratory phasicity is normal and symmetric with the symptomatic side. No evidence of thrombus. Normal compressibility. Common Femoral Vein: No evidence of thrombus. Normal compressibility, respiratory phasicity and response to augmentation. Saphenofemoral Junction: No evidence of thrombus. Normal compressibility and flow on color Doppler imaging. Profunda Femoral Vein: No evidence of thrombus. Normal compressibility and flow on color Doppler imaging. Femoral Vein: No evidence of thrombus. Normal compressibility, respiratory phasicity and response to augmentation. Popliteal Vein: No evidence of thrombus. Normal compressibility, respiratory phasicity and response to augmentation. Calf Veins: No evidence of  thrombus. Normal compressibility and flow on color Doppler imaging. Superficial Great Saphenous Vein: No evidence of thrombus. Normal compressibility and flow on color Doppler imaging. Other Findings:  None. IMPRESSION: Sonographic survey of the right lower extremity negative for DVT Electronically Signed   By: Corrie Mckusick D.O.   On: 10/03/2017 09:33    Assessment & Plan:   Problem List Items Addressed This Visit    Atherosclerosis of native arteries of the extremities with ulceration ([place])    Most recent ABIS were done at [facility] on Sept 5 and were reportedly 0.37 bilaterally.  X rays were done which suggested ostoemyelitis of the right toe .  I have ordered a 3 phase bone scan since MRI and CT with contrast are both contraindicated. He sees Vascular on Sept 16  For repeat assessment.  He is requesting that surgery, if needed (presumedly amputation)  Be done at [facility] due to his need for family to provide assistance post operatively.  I have informed them that Dr Lucky Cowboy will manage that decision and referral .  It is unclear how much offloading was advised, but I have ordered  A rolling walekr,  Wheelchair and for [place] MOTORIZED SCOOTER       Relevant Orders   DNR (Do Not Resuscitate)   Bilateral pain of leg and foot    Secondary to neuropathy and ischemia.  He did not tolerate increased dose of gabapentin , trial of lyrica,  tramadol or vicodin due to  Development of vusual hallucinations  after several doses. He is clearly in pain and although  Neurology referral and home health referral to pain management in progress, he needs better control NOW. We agreed to try alternating tramadol with vicodin  Every 6  hours to avoid accumulation given CKD.      Diabetic neuropathy associated with type 2 diabetes mellitus ([place])    No longer controlled with gabapentin and did not tolerate Lyrica.  Neurology referral in process.      Do not resuscitate discussion    He  continues to request DNR.   New forms signed and given to patient.       Peripheral vascular disease due to secondary diabetes [facility])    For repeat angiogram Sept 16, [facility] . Most recent ABIS 0.37 bilaterally per wound Care sept 5 2019       Relevant Orders   DNR (Do Not Resuscitate)    Other Visit Diagnoses    Altered gait    -  Primary   Relevant Orders   DME Other see comment   Ambulatory referral to Home Health   Osteomyelitis of foot, unspecified laterality, unspecified type ([place])       Relevant Orders   NM Bone Scan 3 Phase Lower Extremity   Ambulatory referral to [place]     A total of 60 minutes was spent with patient more than half of which was spent in counseling patient on the above mentioned issues , reviewing and explaining recent labs and imaging studies done, and coordination of care.  I am having Yevonne Aline maintain his aspirin, Calcium Carbonate-Vitamin D, hydrocortisone, latanoprost, Syringe (Disposable), Insulin Syringe-Needle U-100, SYRINGE-NEEDLE (DISP) 3 ML, Insulin Pen Needle, insulin aspart protamine - aspart, losartan, metoprolol tartrate, fenofibrate, clopidogrel, atorvastatin, furosemide, amLODipine, glucose blood, multivitamin, gabapentin, collagenase, pregabalin, cyanocobalamin, HYDROcodone-acetaminophen, and traMADol.  Meds ordered this encounter  Medications  . DISCONTD: HYDROcodone-acetaminophen (NORCO) 10-325 MG tablet    Sig: Take 1 tablet by mouth every 6 (six) hours as needed.    Dispense:  30 tablet    Refill:  0  . HYDROcodone-acetaminophen (NORCO) 10-325 MG tablet    Sig: Take 1 tablet by mouth every 6 (six) hours as needed.    Dispense:  30 tablet    Refill:  0  . traMADol (ULTRAM) 50 MG tablet    Sig: Take 1 tablet (50 mg total) by mouth at bedtime as needed.    Dispense:  30 tablet    Refill:  0    PATIENT WILL BE ALTERNATING WITH VICODIN    Medications Discontinued During This Encounter  Medication Reason  .  HYDROcodone-acetaminophen (NORCO) 10-325 MG tablet   . traMADol (ULTRAM) 50 MG tablet Reorder    Follow-up: No follow-ups on file.   Crecencio Mc, MD

## 2017-10-14 ENCOUNTER — Other Ambulatory Visit: Payer: Self-pay | Admitting: Physician Assistant

## 2017-10-14 ENCOUNTER — Other Ambulatory Visit (INDEPENDENT_AMBULATORY_CARE_PROVIDER_SITE_OTHER): Payer: Self-pay | Admitting: Nurse Practitioner

## 2017-10-14 DIAGNOSIS — E1169 Type 2 diabetes mellitus with other specified complication: Principal | ICD-10-CM

## 2017-10-14 DIAGNOSIS — L97509 Non-pressure chronic ulcer of other part of unspecified foot with unspecified severity: Principal | ICD-10-CM

## 2017-10-14 DIAGNOSIS — M869 Osteomyelitis, unspecified: Principal | ICD-10-CM

## 2017-10-14 DIAGNOSIS — E11621 Type 2 diabetes mellitus with foot ulcer: Secondary | ICD-10-CM

## 2017-10-14 NOTE — Assessment & Plan Note (Signed)
Secondary to neuropathy and ischemia.  He did not tolerate increased dose of gabapentin , trial of lyrica,  tramadol or vicodin due to  Development of vusual hallucinations  after several doses. He is clearly in pain and although  Neurology referral and home health referral to pain management in progress, he needs better control NOW. We agreed to try alternating tramadol with vicodin  Every 6 hours to avoid accumulation given CKD.

## 2017-10-14 NOTE — Telephone Encounter (Signed)
AS far as I know  The bone scan has always been considered safe for patients with Chronic kidney disease,  And is the modality of choice when MRI and CT scan cannot be done.    If he has stopped the keflex for several days he should not restart it,    He can take up to 2000 mg of tylenol daily,  Including the amount in the 2 vicodin tablets he is taking per day

## 2017-10-14 NOTE — Telephone Encounter (Signed)
Daughter Kathlee Nations calling to ask about this bone scan.  They are concerned about the dye in injection on Friday. Dr Derrel Nip said the bone scan would be better since there is no dye, and due to his kidney functions, should not have the dye.  Would like a call back.

## 2017-10-14 NOTE — Assessment & Plan Note (Signed)
No longer controlled with gabapentin and did not tolerate Lyrica.  Neurology referral in process.

## 2017-10-14 NOTE — Telephone Encounter (Signed)
When Melissa spoke with the pt's daughters on the phone today about the pt's bone scan they had some questions about medications that the pt was given at urgent care. They stated that the pt was given antibiotics but he has not been taking them and they are wanting to know if he needs to continue taking them? They are also wanting to know if the pt needs to take extra tylenol in addition to the vicodin that he was prescribed since it has tylenol in it?

## 2017-10-14 NOTE — Telephone Encounter (Signed)
Called pt regarding bone scan. Spoke to [place]. They have questions regarding the antibiotics that the urgent care gave him to take. They state that he has not been taking them and has half a bottle left. Does he need to continue taking them? Also was rx'd Vicodin and Tramadol and was told to also take a Tylenol for pain-they want to know if he needs to take the extra Tylenol since the Vicodin has Tylenol in it  cb [phone]

## 2017-10-14 NOTE — Assessment & Plan Note (Signed)
He continues to request DNR.   New forms signed and given to patient.

## 2017-10-14 NOTE — Assessment & Plan Note (Addendum)
Most recent ABIS were done at [facility] on Sept 5 and were reportedly 0.37 bilaterally.  X rays were done which suggested ostoemyelitis of the right toe .  I have ordered a 3 phase bone scan since MRI and CT with contrast are both contraindicated. He sees Vascular on Sept 16  For repeat assessment.  He is requesting that surgery, if needed (presumedly amputation)  Be done at [facility] due to his need for family to provide assistance post operatively.  I have informed them that Dr Lucky Cowboy will manage that decision and referral .  It is unclear how much offloading was advised, but I have ordered  A rolling walekr,  Wheelchair and for HOME HEalth TO DO AN ASSESSMENT FOR MOTORIZED SCOOTER

## 2017-10-14 NOTE — Assessment & Plan Note (Addendum)
For repeat angiogram Sept 16, [facility] . Most recent ABIS 0.37 bilaterally per wound Care sept 5 2019

## 2017-10-15 ENCOUNTER — Telehealth: Payer: Self-pay

## 2017-10-15 ENCOUNTER — Telehealth: Payer: Self-pay | Admitting: Internal Medicine

## 2017-10-15 DIAGNOSIS — I70203 Unspecified atherosclerosis of native arteries of extremities, bilateral legs: Secondary | ICD-10-CM | POA: Diagnosis not present

## 2017-10-15 DIAGNOSIS — M79604 Pain in right leg: Secondary | ICD-10-CM | POA: Diagnosis not present

## 2017-10-15 DIAGNOSIS — Z794 Long term (current) use of insulin: Secondary | ICD-10-CM | POA: Diagnosis not present

## 2017-10-15 DIAGNOSIS — L97513 Non-pressure chronic ulcer of other part of right foot with necrosis of muscle: Secondary | ICD-10-CM | POA: Diagnosis not present

## 2017-10-15 DIAGNOSIS — E11621 Type 2 diabetes mellitus with foot ulcer: Secondary | ICD-10-CM | POA: Diagnosis not present

## 2017-10-15 DIAGNOSIS — I10 Essential (primary) hypertension: Secondary | ICD-10-CM | POA: Diagnosis not present

## 2017-10-15 NOTE — Telephone Encounter (Unsigned)
Copied from [place] [id number]. Topic: General - Other >> Oct 15, 2017  1:09 PM Carolyn Stare wrote:  Judeen Hammans with [facility] cal lto say Dr Derrel Nip would need to put in the referral for palliative care, pt daughter tols Judeen Hammans that she has told us several time that she wanted this   [phone]

## 2017-10-15 NOTE — Telephone Encounter (Signed)
Patient's bone scan has been ordered by wound care, without contrast.  so ours can be cancelled   Thank you for your help,  TT

## 2017-10-15 NOTE — Telephone Encounter (Signed)
Request for palliative care

## 2017-10-15 NOTE — Telephone Encounter (Signed)
SEE UNROUTED NOTE,

## 2017-10-15 NOTE — Telephone Encounter (Signed)
Family is requesting referral for palliative care.

## 2017-10-15 NOTE — Telephone Encounter (Signed)
[name] WHAT IS THE PROBLEM WITH [facility] ? I THOUGHT THEY WOULD PROVIDE PALLIATIVE CARE?  CAN YOU PLEASE CLARIFY ?  SHOULD THE ORDER FOR PALLIATIVE CARE BE THROUGH [facility] [place] ?

## 2017-10-15 NOTE — Telephone Encounter (Signed)
Spoke with [name] and she stated that they see wound care tomorrow at the office and they are going to let the doctor there know that the pt didn't finish taking the keflex and see if they wold like for him to go back on something else for the infection. She also stated that wound care has ordered a bone scan without contrast for both legs and he is having that done towards the end of the week. [name] as well that the pt can take up to 2000mg  of tylenol daily including the amount in the 2 vicodin tablets he takes daily.

## 2017-10-15 NOTE — Telephone Encounter (Signed)
Copied from [place]. Topic: Referral - Status >> Oct 15, 2017  9:25 AM Reyne Dumas L wrote: Reason for CRM:   Kathlee Nations calling to check on status of referral for some sort of scan on pt's feet. Kathlee Nations can be reached at [phone].

## 2017-10-15 NOTE — Telephone Encounter (Signed)
Spoke with Kathlee Nations and she stated that wound care has ordered the bone scan for the pt and that he is having this done at the end of this week.

## 2017-10-16 ENCOUNTER — Other Ambulatory Visit: Payer: Self-pay | Admitting: Internal Medicine

## 2017-10-16 ENCOUNTER — Encounter: Payer: Medicare Other | Admitting: Physician Assistant

## 2017-10-16 DIAGNOSIS — E11621 Type 2 diabetes mellitus with foot ulcer: Secondary | ICD-10-CM | POA: Diagnosis not present

## 2017-10-16 DIAGNOSIS — I132 Hypertensive heart and chronic kidney disease with heart failure and with stage 5 chronic kidney disease, or end stage renal disease: Secondary | ICD-10-CM | POA: Diagnosis not present

## 2017-10-16 DIAGNOSIS — B9561 Methicillin susceptible Staphylococcus aureus infection as the cause of diseases classified elsewhere: Secondary | ICD-10-CM | POA: Diagnosis not present

## 2017-10-16 DIAGNOSIS — L97526 Non-pressure chronic ulcer of other part of left foot with bone involvement without evidence of necrosis: Secondary | ICD-10-CM | POA: Diagnosis not present

## 2017-10-16 DIAGNOSIS — B965 Pseudomonas (aeruginosa) (mallei) (pseudomallei) as the cause of diseases classified elsewhere: Secondary | ICD-10-CM | POA: Diagnosis not present

## 2017-10-16 DIAGNOSIS — L97512 Non-pressure chronic ulcer of other part of right foot with fat layer exposed: Secondary | ICD-10-CM | POA: Diagnosis not present

## 2017-10-16 DIAGNOSIS — E1122 Type 2 diabetes mellitus with diabetic chronic kidney disease: Secondary | ICD-10-CM | POA: Diagnosis not present

## 2017-10-16 DIAGNOSIS — E1140 Type 2 diabetes mellitus with diabetic neuropathy, unspecified: Secondary | ICD-10-CM | POA: Diagnosis not present

## 2017-10-16 NOTE — Telephone Encounter (Signed)
Hilda Blades calling about [name].

## 2017-10-16 NOTE — Telephone Encounter (Signed)
[name] with [facility] [place] [facility] would like [name] to call her back regarding the referral she just received. Her number is [phone]

## 2017-10-17 ENCOUNTER — Encounter
Admission: RE | Admit: 2017-10-17 | Discharge: 2017-10-17 | Disposition: A | Discharge status: Home / Self Care | Payer: Medicare Other | Source: Ambulatory Visit | Attending: Vascular Surgery | Admitting: Vascular Surgery

## 2017-10-17 ENCOUNTER — Ambulatory Visit: Payer: Medicare Other

## 2017-10-17 DIAGNOSIS — Z01812 Encounter for preprocedural laboratory examination: Secondary | ICD-10-CM | POA: Diagnosis not present

## 2017-10-17 HISTORY — DX: Unspecified osteoarthritis, unspecified site: M19.90

## 2017-10-17 HISTORY — DX: Malignant (primary) neoplasm, unspecified: C80.1

## 2017-10-17 LAB — BUN: BUN: 52 mg/dL — AB (ref 8–23)

## 2017-10-17 LAB — CREATININE, SERUM
CREATININE: 2.08 mg/dL — AB (ref 0.61–1.24)
GFR calc non Af Amer: 26 mL/min — ABNORMAL LOW (ref >60)
GFR, EST AFRICAN AMERICAN: 30 mL/min — AB (ref >60)

## 2017-10-18 DIAGNOSIS — L97513 Non-pressure chronic ulcer of other part of right foot with necrosis of muscle: Secondary | ICD-10-CM | POA: Diagnosis not present

## 2017-10-18 DIAGNOSIS — I10 Essential (primary) hypertension: Secondary | ICD-10-CM | POA: Diagnosis not present

## 2017-10-18 DIAGNOSIS — M79604 Pain in right leg: Secondary | ICD-10-CM | POA: Diagnosis not present

## 2017-10-18 DIAGNOSIS — I70203 Unspecified atherosclerosis of native arteries of extremities, bilateral legs: Secondary | ICD-10-CM | POA: Diagnosis not present

## 2017-10-18 DIAGNOSIS — Z794 Long term (current) use of insulin: Secondary | ICD-10-CM | POA: Diagnosis not present

## 2017-10-18 DIAGNOSIS — E11621 Type 2 diabetes mellitus with foot ulcer: Secondary | ICD-10-CM | POA: Diagnosis not present

## 2017-10-19 MED ORDER — DEXTROSE 5 % IV SOLN
2.0000 g | Freq: Once | INTRAVENOUS | Status: AC
  Administered 2017-10-20: 2 g via INTRAVENOUS
  Filled 2017-10-19: qty 20

## 2017-10-20 ENCOUNTER — Encounter: Payer: Self-pay | Admitting: *Deleted

## 2017-10-20 ENCOUNTER — Encounter
Admission: RE | Disposition: A | Discharge status: Home / Self Care | Payer: Self-pay | Source: Ambulatory Visit | Attending: Vascular Surgery

## 2017-10-20 ENCOUNTER — Ambulatory Visit: Payer: Medicare Other

## 2017-10-20 ENCOUNTER — Ambulatory Visit
Admission: RE | Admit: 2017-10-20 | Discharge: 2017-10-20 | Disposition: A | Discharge status: Home / Self Care | Payer: Medicare Other | Source: Ambulatory Visit | Attending: Vascular Surgery | Admitting: Vascular Surgery

## 2017-10-20 DIAGNOSIS — Z981 Arthrodesis status: Secondary | ICD-10-CM | POA: Diagnosis not present

## 2017-10-20 DIAGNOSIS — E785 Hyperlipidemia, unspecified: Secondary | ICD-10-CM | POA: Insufficient documentation

## 2017-10-20 DIAGNOSIS — I70299 Other atherosclerosis of native arteries of extremities, unspecified extremity: Secondary | ICD-10-CM

## 2017-10-20 DIAGNOSIS — E11621 Type 2 diabetes mellitus with foot ulcer: Secondary | ICD-10-CM | POA: Insufficient documentation

## 2017-10-20 DIAGNOSIS — L97521 Non-pressure chronic ulcer of other part of left foot limited to breakdown of skin: Secondary | ICD-10-CM | POA: Insufficient documentation

## 2017-10-20 DIAGNOSIS — I70245 Atherosclerosis of native arteries of left leg with ulceration of other part of foot: Secondary | ICD-10-CM | POA: Insufficient documentation

## 2017-10-20 DIAGNOSIS — I70235 Atherosclerosis of native arteries of right leg with ulceration of other part of foot: Secondary | ICD-10-CM | POA: Diagnosis not present

## 2017-10-20 DIAGNOSIS — Z951 Presence of aortocoronary bypass graft: Secondary | ICD-10-CM | POA: Insufficient documentation

## 2017-10-20 DIAGNOSIS — Z87891 Personal history of nicotine dependence: Secondary | ICD-10-CM | POA: Insufficient documentation

## 2017-10-20 DIAGNOSIS — I70248 Atherosclerosis of native arteries of left leg with ulceration of other part of lower left leg: Secondary | ICD-10-CM | POA: Diagnosis not present

## 2017-10-20 DIAGNOSIS — E1122 Type 2 diabetes mellitus with diabetic chronic kidney disease: Secondary | ICD-10-CM | POA: Diagnosis not present

## 2017-10-20 DIAGNOSIS — E1151 Type 2 diabetes mellitus with diabetic peripheral angiopathy without gangrene: Secondary | ICD-10-CM | POA: Diagnosis not present

## 2017-10-20 DIAGNOSIS — Z85828 Personal history of other malignant neoplasm of skin: Secondary | ICD-10-CM | POA: Diagnosis not present

## 2017-10-20 DIAGNOSIS — Z833 Family history of diabetes mellitus: Secondary | ICD-10-CM | POA: Diagnosis not present

## 2017-10-20 DIAGNOSIS — M1990 Unspecified osteoarthritis, unspecified site: Secondary | ICD-10-CM | POA: Insufficient documentation

## 2017-10-20 DIAGNOSIS — Z9889 Other specified postprocedural states: Secondary | ICD-10-CM | POA: Insufficient documentation

## 2017-10-20 DIAGNOSIS — Z888 Allergy status to other drugs, medicaments and biological substances status: Secondary | ICD-10-CM | POA: Insufficient documentation

## 2017-10-20 DIAGNOSIS — L97529 Non-pressure chronic ulcer of other part of left foot with unspecified severity: Secondary | ICD-10-CM | POA: Diagnosis not present

## 2017-10-20 DIAGNOSIS — I129 Hypertensive chronic kidney disease with stage 1 through stage 4 chronic kidney disease, or unspecified chronic kidney disease: Secondary | ICD-10-CM | POA: Diagnosis not present

## 2017-10-20 DIAGNOSIS — Z885 Allergy status to narcotic agent status: Secondary | ICD-10-CM | POA: Insufficient documentation

## 2017-10-20 DIAGNOSIS — N184 Chronic kidney disease, stage 4 (severe): Secondary | ICD-10-CM | POA: Insufficient documentation

## 2017-10-20 DIAGNOSIS — L97909 Non-pressure chronic ulcer of unspecified part of unspecified lower leg with unspecified severity: Secondary | ICD-10-CM

## 2017-10-20 DIAGNOSIS — L97511 Non-pressure chronic ulcer of other part of right foot limited to breakdown of skin: Secondary | ICD-10-CM | POA: Insufficient documentation

## 2017-10-20 DIAGNOSIS — Z8249 Family history of ischemic heart disease and other diseases of the circulatory system: Secondary | ICD-10-CM | POA: Diagnosis not present

## 2017-10-20 DIAGNOSIS — Z950 Presence of cardiac pacemaker: Secondary | ICD-10-CM | POA: Insufficient documentation

## 2017-10-20 DIAGNOSIS — L97519 Non-pressure chronic ulcer of other part of right foot with unspecified severity: Secondary | ICD-10-CM | POA: Diagnosis not present

## 2017-10-20 DIAGNOSIS — E119 Type 2 diabetes mellitus without complications: Secondary | ICD-10-CM | POA: Diagnosis not present

## 2017-10-20 HISTORY — PX: LOWER EXTREMITY ANGIOGRAPHY: CATH118251

## 2017-10-20 LAB — GLUCOSE, CAPILLARY
Glucose-Capillary: 79 mg/dL (ref 70–99)
Glucose-Capillary: 92 mg/dL (ref 70–99)

## 2017-10-20 SURGERY — LOWER EXTREMITY ANGIOGRAPHY
Anesthesia: Moderate Sedation | Laterality: Left

## 2017-10-20 MED ORDER — IOPAMIDOL (ISOVUE-300) INJECTION 61%
INTRAVENOUS | Status: DC | PRN
  Administered 2017-10-20: 40 mL via INTRAVENOUS

## 2017-10-20 MED ORDER — ACETAMINOPHEN 325 MG PO TABS: 650.0000 mg | ORAL_TABLET | ORAL | Status: DC | PRN

## 2017-10-20 MED ORDER — HEPARIN SODIUM (PORCINE) 1000 UNIT/ML IJ SOLN
INTRAMUSCULAR | Status: AC
  Filled 2017-10-20: qty 1

## 2017-10-20 MED ORDER — SODIUM CHLORIDE 0.9% FLUSH: 3.0000 mL | Freq: Two times a day (BID) | INTRAVENOUS | Status: DC

## 2017-10-20 MED ORDER — HYDRALAZINE HCL 20 MG/ML IJ SOLN: 5.0000 mg | INTRAMUSCULAR | Status: DC | PRN

## 2017-10-20 MED ORDER — SODIUM CHLORIDE 0.9% FLUSH: 3.0000 mL | INTRAVENOUS | Status: DC | PRN

## 2017-10-20 MED ORDER — HEPARIN (PORCINE) IN NACL 1000-0.9 UT/500ML-% IV SOLN
INTRAVENOUS | Status: AC
  Filled 2017-10-20: qty 1000

## 2017-10-20 MED ORDER — LIDOCAINE-EPINEPHRINE (PF) 1 %-1:200000 IJ SOLN
INTRAMUSCULAR | Status: AC
  Filled 2017-10-20: qty 30

## 2017-10-20 MED ORDER — SODIUM CHLORIDE 0.9 % IV SOLN: INTRAVENOUS | Status: DC

## 2017-10-20 MED ORDER — SODIUM CHLORIDE 0.9 % IV SOLN: 250.0000 mL | INTRAVENOUS | Status: DC | PRN

## 2017-10-20 MED ORDER — ONDANSETRON HCL 4 MG/2ML IJ SOLN: 4.0000 mg | Freq: Four times a day (QID) | INTRAMUSCULAR | Status: DC | PRN

## 2017-10-20 MED ORDER — LABETALOL HCL 5 MG/ML IV SOLN: 10.0000 mg | INTRAVENOUS | Status: DC | PRN

## 2017-10-20 MED ORDER — SODIUM CHLORIDE 0.9 % IV BOLUS
250.0000 mL | Freq: Once | INTRAVENOUS | Status: AC
  Administered 2017-10-20: 250 mL via INTRAVENOUS

## 2017-10-20 MED ORDER — IBUPROFEN 800 MG PO TABS
400.0000 mg | ORAL_TABLET | Freq: Once | ORAL | Status: AC
  Administered 2017-10-20: 400 mg via ORAL
  Filled 2017-10-20: qty 0.5

## 2017-10-20 MED ORDER — HEPARIN SODIUM (PORCINE) 1000 UNIT/ML IJ SOLN
INTRAMUSCULAR | Status: DC | PRN
  Administered 2017-10-20: 4000 [IU] via INTRAVENOUS

## 2017-10-20 MED ORDER — MIDAZOLAM HCL 5 MG/5ML IJ SOLN
INTRAMUSCULAR | Status: AC
  Filled 2017-10-20: qty 5

## 2017-10-20 MED ORDER — FENTANYL CITRATE (PF) 100 MCG/2ML IJ SOLN
INTRAMUSCULAR | Status: DC | PRN
  Administered 2017-10-20 (×3): 25 ug via INTRAVENOUS

## 2017-10-20 MED ORDER — FENTANYL CITRATE (PF) 100 MCG/2ML IJ SOLN
INTRAMUSCULAR | Status: AC
  Filled 2017-10-20: qty 2

## 2017-10-20 MED ORDER — HYDROMORPHONE HCL 1 MG/ML IJ SOLN: 1.0000 mg | Freq: Once | INTRAMUSCULAR | Status: DC | PRN

## 2017-10-20 MED ORDER — MIDAZOLAM HCL 2 MG/2ML IJ SOLN
INTRAMUSCULAR | Status: DC | PRN
  Administered 2017-10-20 (×2): 1 mg via INTRAVENOUS

## 2017-10-20 SURGICAL SUPPLY — 21 items
BALLN ULTRVRSE 3X80X150 (BALLOONS) ×2
BALLN ULTRVRSE 3X80X150 OTW (BALLOONS) ×1
BALLOON ULTRVRSE 3X80X150 OTW (BALLOONS) ×1 IMPLANT
CANNULA 5F STIFF (CANNULA) ×3 IMPLANT
CATH BEACON 5 .035 40 KMP TP (CATHETERS) ×1 IMPLANT
CATH BEACON 5 .035 65 RIM TIP (CATHETERS) ×3 IMPLANT
CATH BEACON 5 .038 100 VERT TP (CATHETERS) ×3 IMPLANT
CATH BEACON 5 .038 40 KMP TP (CATHETERS) ×2
CATH CXI SUPP ANG 2.6FR 150CM (CATHETERS) ×3 IMPLANT
CATH PIG 70CM (CATHETERS) ×3 IMPLANT
DEVICE PRESTO INFLATION (MISCELLANEOUS) ×3 IMPLANT
DEVICE STARCLOSE SE CLOSURE (Vascular Products) ×3 IMPLANT
GLIDEWIRE ADV .035X260CM (WIRE) ×3 IMPLANT
GUIDEWIRE SUPER STIFF .035X180 (WIRE) ×3 IMPLANT
PACK ANGIOGRAPHY (CUSTOM PROCEDURE TRAY) ×3 IMPLANT
SHEATH ANL1 5FRX70 (SHEATH) ×3 IMPLANT
SHEATH BRITE TIP 5FRX11 (SHEATH) ×3 IMPLANT
SHEATH RAABE 6FRX70 (SHEATH) IMPLANT
TUBING CONTRAST HIGH PRESS 72 (TUBING) ×3 IMPLANT
WIRE G V18X300CM (WIRE) ×3 IMPLANT
WIRE J 3MM .035X145CM (WIRE) ×3 IMPLANT

## 2017-10-20 NOTE — Op Note (Signed)
[facility]  Percutaneous Study/Intervention Procedural Note   Date of Surgery: 10/20/2017  Surgeon(s):DEW,JASON    Assistants:none  Pre-operative Diagnosis: PAD with ulceration BLE  Post-operative diagnosis:  Same  Procedure(s) Performed:             1.  Ultrasound guidance for vascular access right femoral artery             2.  Catheter placement into left peroneal artery from right femoral approach             3.  Aortogram and selective left lower extremity angiogram             4.  Percutaneous transluminal angioplasty of left peroneal artery with 3 mm diameter by 8 cm length angioplasty balloon             5.  StarClose closure device right femoral artery  EBL: 10 cc  Contrast: 40 cc  Fluoro Time: 5 minutes  Moderate Conscious Sedation Time: approximately 30 minutes using 2 mg of Versed and 50 Mcg of Fentanyl              Indications:  Patient is a 84 y.o.male with rest [name] and ulceration of both lower extremities. The patient has noninvasive study showing markedly reduced ABIs bilaterally and he has a previous history of significant peripheral arterial disease. The patient is brought in for angiography for further evaluation and potential treatment.  Due to the limb threatening nature of the situation, angiogram was performed for attempted limb salvage. The patient is aware that if the procedure fails, amputation would be expected.  The patient also understands that even with successful revascularization, amputation may still be required due to the severity of the situation.  Risks and benefits are discussed and informed consent is obtained.   Procedure:  The patient was identified and appropriate procedural time out was performed.  The patient was then placed supine on the table and prepped and draped in the usual sterile fashion. Moderate conscious sedation was administered during a face to face encounter with the patient throughout the procedure with  my supervision of the RN administering medicines and monitoring the patient's vital signs, pulse oximetry, telemetry and mental status throughout from the start of the procedure until the patient was taken to the recovery room. Ultrasound was used to evaluate the right common femoral artery.  It was patent .  A digital ultrasound image was acquired.  A Seldinger needle was used to access the right common femoral artery under direct ultrasound guidance and a permanent image was performed.  A 0.035 J wire was advanced without resistance and a 5Fr sheath was placed.  Pigtail catheter was placed into the aorta and an AP aortogram was performed. This demonstrated normal flow in the renal arteries including a left renal artery stent.  Infrarenal abdominal aortic aneurysm stable from previous studies.  Bilateral common iliac stents which were patent without significant recurrent stenosis.  No significant external iliac artery stenosis. . I then crossed the aortic bifurcation and advanced to the left femoral head. Selective left lower extremity angiogram was then performed. This demonstrated heavily calcified vessels but the common femoral artery, profunda femoris artery, superficial femoral artery including the previously placed stent, and popliteal arteries had good flow with no greater than 20 to 30% stenosis identified.  There was then severe tibial disease.  The posterior tibial artery was not seen at all.  The anterior tibial artery occluded shortly beyond its  origin and did not really have a good distal target for revascularization.  The peroneal artery was really the only runoff distally and in the mid segment there was a short segment occlusion that was highly calcific. The patient was systemically heparinized and a 5 French 70 cm sheath was then placed over the Terumo Advantage wire. I then used a Kumpe catheter and the advantage wire to navigate down into the tibial trifurcation.  I then exchanged for a V 18 wire  and a CXI catheter and cross the peroneal artery occlusion with minimal difficulty.  At this point, I elected to proceed with treatment concomitantly as we had not use much contrast and it would save him a further procedure.  The diagnostic catheter was removed.  A 3 mm diameter by 8 cm length angioplasty balloon was then taken down to the mid peroneal artery and inflated to 12 atm for 1 minute.  Completion imaging showed much more brisk flow in the peroneal artery with less than 20% residual stenosis although he still had significant small vessel disease in the foot and ankle that would not be amenable to any further therapy. I elected to terminate the procedure. The sheath was removed and StarClose closure device was deployed in the right femoral artery with excellent hemostatic result. The patient was taken to the recovery room in stable condition having tolerated the procedure well.  Findings:               Aortogram:  Normal flow in the renal arteries including a left renal artery stent.  Infrarenal abdominal aortic aneurysm stable from previous studies.  Bilateral common iliac stents which were patent without significant recurrent stenosis.  No significant external iliac artery stenosis.             Left Lower Extremity:  This demonstrated heavily calcified vessels but the common femoral artery, profunda femoris artery, superficial femoral artery including the previously placed stent, and popliteal arteries had good flow with no greater than 20 to 30% stenosis identified.  There was then severe tibial disease.  The posterior tibial artery was not seen at all.  The anterior tibial artery occluded shortly beyond its origin and did not really have a good distal target for revascularization.  The peroneal artery was really the only runoff distally and in the mid segment there was a short segment occlusion that was highly calcific   Disposition: Patient was taken to the recovery room in stable condition  having tolerated the procedure well.  Complications: None  [name] 10/20/2017 10:17 AM   This note was created with Dragon Medical transcription system. Any errors in dictation are purely unintentional.

## 2017-10-20 NOTE — H&P (Signed)
[facility] Admission History & Physical  MRN : [id number]  [name] is a 84 y.o. (Jun 23, 1924) male who presents with chief complaint of No chief complaint on file. Marland Kitchen  History of Present Illness: Patient returns in follow-up of his severe peripheral arterial disease.  He has had multiple previous treatments including a femoral to popliteal bypass many years ago on the right and a percutaneous intervention on the left leg earlier this year.  He has persistent ulceration and now scans worrisome for osteomyelitis in the foot on the right.  His ulcerations are bilateral.  He is here today for angiogram for evaluation to see if anything further can be done.  Current Facility-Administered Medications  Medication Dose Route Frequency Provider Last Rate Last Dose  . 0.9 %  sodium chloride infusion   Intravenous Continuous Eulogio Ditch E, NP      . ceFAZolin (ANCEF) 2 g in dextrose 5 % 50 mL IVPB  2 g Intravenous Once Eulogio Ditch E, NP      . HYDROmorphone (DILAUDID) injection 1 mg  1 mg Intravenous Once PRN Kris Hartmann, NP      . ondansetron [facility]) injection 4 mg  4 mg Intravenous Q6H PRN Eulogio Ditch E, NP      . sodium chloride 0.9 % bolus 250 mL  250 mL Intravenous Once Algernon Huxley, MD        Past Medical History:  Diagnosis Date  . Arthritis   . Blood transfusion   . Cancer ([place])    skin cancer on forehead  . Coronary artery disease    h/o bypass coronary- 2001 & peripheral- 2011 , last full cardiac visit  in [place] , [place], [employer]  . Diabetes mellitus   . H/O: malaria 21   in [place]  . Hyperlipidemia   . Hypertension   . Peripheral artery disease ([place])    folowed by [place] bypass 2011 [place]  . Renal insufficiency    by Nov 2012 labs, no old records available    Past Surgical History:  Procedure Laterality Date  . CORONARY ARTERY BYPASS GRAFT  2001   4 vessel, done in [place]   . FEMORAL BYPASS  2011   done in [place],   now followed by Leotis Pain  . INSERT / REPLACE / REMOVE PACEMAKER     2004  . LOWER EXTREMITY ANGIOGRAPHY Left 08/18/2017   Procedure: LOWER EXTREMITY ANGIOGRAPHY;  Surgeon: Algernon Huxley, MD;  Location: [facility];  Service: Cardiovascular;  Laterality: Left;  . LUMBAR LAMINECTOMY/DECOMPRESSION MICRODISCECTOMY  01/16/2011   Procedure: LUMBAR LAMINECTOMY/DECOMPRESSION MICRODISCECTOMY;  Surgeon: Ophelia Charter;  Location: [facility];  Service: Neurosurgery;  Laterality: N/A;  Lumbar four and lumbar five laminectomies   . PACEMAKER INSERTION    . SPINE SURGERY      Social History Social History   Tobacco Use  . Smoking status: Former Smoker    Last attempt to quit: 09/27/1985    Years since quitting: 32.0  . Smokeless tobacco: Never Used  Substance Use Topics  . Alcohol use: No    Comment: occassional  . Drug use: No    Family History Family History  Problem Relation Age of Onset  . Mental illness Mother 44       alzheimers  . Peripheral vascular disease Father   . Diabetes Father   . Anesthesia problems Neg Hx   . Hypotension Neg Hx   .  Malignant hyperthermia Neg Hx   . Pseudochol deficiency Neg Hx     Allergies  Allergen Reactions  . Hydrocodone-Acetaminophen Other (See Comments)    Hallucinations at higher doses  . Lyrica [Pregabalin] Other (See Comments)    dizziness  . Tramadol Other (See Comments)    Hallucinations with higher doses  . Trazodone And Nefazodone Other (See Comments)    tachycardia     REVIEW OF SYSTEMS (Negative unless checked)  Constitutional: [] Weight loss  [] Fever  [] Chills Cardiac: [] Chest pain   [] Chest pressure   [] Palpitations   [] Shortness of breath when laying flat   [] Shortness of breath at rest   [] Shortness of breath with exertion. Vascular:  [] Pain in legs with walking   [x] Pain in legs at rest   [] Pain in legs when laying flat   [] Claudication   [] Pain in feet when walking  [] Pain in feet at rest  [] Pain in feet when  laying flat   [] History of DVT   [] Phlebitis   [] Swelling in legs   [] Varicose veins   [x] Non-healing ulcers Pulmonary:   [] Uses home oxygen   [] Productive cough   [] Hemoptysis   [] Wheeze  [] COPD   [] Asthma Neurologic:  [] Dizziness  [] Blackouts   [] Seizures   [] History of stroke   [] History of TIA  [] Aphasia   [] Temporary blindness   [] Dysphagia   [] Weakness or numbness in arms   [] Weakness or numbness in legs Musculoskeletal:  [x] Arthritis   [] Joint swelling   [] Joint pain   [] Low back pain Hematologic:  [] Easy bruising  [] Easy bleeding   [] Hypercoagulable state   [] Anemic  [] Hepatitis Gastrointestinal:  [] Blood in stool   [] Vomiting blood  [] Gastroesophageal reflux/heartburn   [] Difficulty swallowing. Genitourinary:  [x] Chronic kidney disease   [] Difficult urination  [] Frequent urination  [] Burning with urination   [] Blood in urine Skin:  [] Rashes   [x] Ulcers   [x] Wounds Psychological:  [] History of anxiety   []  History of major depression.  Physical Examination  There were no vitals filed for this visit. There is no height or weight on file to calculate BMI. Gen: WD/WN, NAD Head: [place]/AT, No temporalis wasting. Prominent temp pulse not noted. Ear/Nose/Throat: Hearing grossly intact, nares w/o erythema or drainage, oropharynx w/o Erythema/Exudate,  Eyes: Conjunctiva clear, sclera non-icteric Neck: Trachea midline.  No JVD.  Pulmonary:  Good air movement, respirations not labored, no use of accessory muscles.  Cardiac: RRR, normal S1, S2. Vascular: Irregular Vessel Right Left  Radial Palpable Palpable                          PT  trace palpable  trace palpable  DP  not palpable  not palpable    Musculoskeletal: M/S 5/5 throughout.  Ulceration 1-3 on the right, toe 4 on the left.  Mild drainage. Neurologic: Sensation grossly intact in extremities.  Symmetrical.  Speech is fluent. Motor exam as listed above. Psychiatric: Judgment intact, Mood & affect appropriate for pt's clinical  situation.       CBC Lab Results  Component Value Date   WBC 9.4 10/03/2017   HGB 10.6 (L) 10/03/2017   HCT 31.0 (L) 10/03/2017   MCV 95.4 10/03/2017   PLT 212 10/03/2017    BMET    Component Value Date/Time   NA 137 10/03/2017 0959   K 4.2 10/03/2017 0959   CL 107 10/03/2017 0959   CO2 22 10/03/2017 0959   GLUCOSE 115 (H) 10/03/2017 0959   BUN  52 (H) 10/17/2017 0906   CREATININE 2.08 (H) 10/17/2017 0906   CREATININE 1.70 (H) 06/26/2011 1028   CALCIUM 8.9 10/03/2017 0959   GFRNONAA 26 (L) 10/17/2017 0906   GFRNONAA 36 (L) 06/26/2011 1028   GFRAA 30 (L) 10/17/2017 0906   GFRAA 42 (L) 06/26/2011 1028   Estimated Creatinine Clearance: 23.6 mL/min (A) (by C-G formula based on SCr of 2.08 mg/dL (H)).  COAG No results found for: INR, PROTIME  Radiology Dg Ankle Complete Right  Result Date: 10/03/2017 CLINICAL DATA:  Swelling RIGHT lower leg for 2 days, redness, history diabetes mellitus, hypertension, coronary artery disease, peripheral arterial disease EXAM: RIGHT ANKLE - COMPLETE 3+ VIEW COMPARISON:  None FINDINGS: Scattered soft tissue swelling RIGHT lower leg and ankle especially medial ankle. Osseous mineralization normal. Joint spaces preserved. Small plantar calcaneal spur. No acute fracture, dislocation, or bone destruction. Extensive small vessel vascular calcifications at the RIGHT lower leg into RIGHT foot. IMPRESSION: Soft tissue swelling and extensive small vessel vascular calcifications without acute osseous abnormalities. Electronically Signed   By: Lavonia Dana M.D.   On: 10/03/2017 10:34   US Venous Img Lower Unilateral Right  Result Date: 10/03/2017 CLINICAL DATA:  84 year old male with a history of leg swelling EXAM: RIGHT LOWER EXTREMITY VENOUS DOPPLER ULTRASOUND TECHNIQUE: Gray-scale sonography with graded compression, as well as color Doppler and duplex ultrasound were performed to evaluate the lower extremity deep venous systems from the level of the  common femoral vein and including the common femoral, femoral, profunda femoral, popliteal and calf veins including the posterior tibial, peroneal and gastrocnemius veins when visible. The superficial great saphenous vein was also interrogated. Spectral Doppler was utilized to evaluate flow at rest and with distal augmentation maneuvers in the common femoral, femoral and popliteal veins. COMPARISON:  None. FINDINGS: Contralateral Common Femoral Vein: Respiratory phasicity is normal and symmetric with the symptomatic side. No evidence of thrombus. Normal compressibility. Common Femoral Vein: No evidence of thrombus. Normal compressibility, respiratory phasicity and response to augmentation. Saphenofemoral Junction: No evidence of thrombus. Normal compressibility and flow on color Doppler imaging. Profunda Femoral Vein: No evidence of thrombus. Normal compressibility and flow on color Doppler imaging. Femoral Vein: No evidence of thrombus. Normal compressibility, respiratory phasicity and response to augmentation. Popliteal Vein: No evidence of thrombus. Normal compressibility, respiratory phasicity and response to augmentation. Calf Veins: No evidence of thrombus. Normal compressibility and flow on color Doppler imaging. Superficial Great Saphenous Vein: No evidence of thrombus. Normal compressibility and flow on color Doppler imaging. Other Findings:  None. IMPRESSION: Sonographic survey of the right lower extremity negative for DVT Electronically Signed   By: Corrie Mckusick D.O.   On: 10/03/2017 09:33      Assessment/Plan 1.  PAD with ulceration and infection bilateral lower extremities.  Very difficult situation.  Angiogram to be done today to evaluate flow to see if there is any further we can do to help augment healing.  Patient may need amputations for pain and infection ultimately. 2.  CKD stage IV.  Hydration today and we will try to limit contrast with the procedure. 3.  Diabetes. blood glucose  control important in reducing the progression of atherosclerotic disease. Also, involved in wound healing. On appropriate medications.    Leotis Pain, MD  10/20/2017 9:14 AM

## 2017-10-21 ENCOUNTER — Telehealth: Payer: Self-pay

## 2017-10-21 ENCOUNTER — Telehealth (INDEPENDENT_AMBULATORY_CARE_PROVIDER_SITE_OTHER): Payer: Self-pay

## 2017-10-21 DIAGNOSIS — L97513 Non-pressure chronic ulcer of other part of right foot with necrosis of muscle: Secondary | ICD-10-CM | POA: Diagnosis not present

## 2017-10-21 DIAGNOSIS — E11621 Type 2 diabetes mellitus with foot ulcer: Secondary | ICD-10-CM | POA: Diagnosis not present

## 2017-10-21 DIAGNOSIS — I70203 Unspecified atherosclerosis of native arteries of extremities, bilateral legs: Secondary | ICD-10-CM | POA: Diagnosis not present

## 2017-10-21 DIAGNOSIS — Z794 Long term (current) use of insulin: Secondary | ICD-10-CM | POA: Diagnosis not present

## 2017-10-21 DIAGNOSIS — I10 Essential (primary) hypertension: Secondary | ICD-10-CM | POA: Diagnosis not present

## 2017-10-21 DIAGNOSIS — M79604 Pain in right leg: Secondary | ICD-10-CM | POA: Diagnosis not present

## 2017-10-21 NOTE — Telephone Encounter (Signed)
Son called and stated that his Dad is having severe pains, shooting down his legs since he had the stents put in during the angio. He wants to know if this pain is to be expected? And what can be done to alleviate the pain?

## 2017-10-21 NOTE — Telephone Encounter (Signed)
Copied from [place] [phone]. Topic: Quick Communication - See Telephone Encounter >> Oct 21, 2017  4:35 PM Nils Flack, Marland Kitchen wrote: CRM for notification. See Telephone encounter for: 10/21/17. Pete from [facility] called.  The pt and family are refusing home health services.  Laurey Arrow is postponing eval until the last week of September and will revisit then.   Cb is [phone]

## 2017-10-21 NOTE — Progress Notes (Signed)
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 cream applied to wound bed prior to debridement (In Clinic Only). Primary Wound Dressing: Wound #1 Right Toe Second: Santyl Ointment Wound #2 Left Toe Third: Santyl Ointment Secondary Dressing: Wound #1 Right Toe Second: Gauze and Kerlix/Conform [place], Santanna S. ([id number]) Wound #2 Left Toe Third: Gauze and Kerlix/Conform Dressing Change Frequency: Wound #1 Right Toe Second: Change dressing every day. - HHRN to change dressing three times weekly and [facility] will change dressing once and family will attempt to change dressing the other days Wound #2 Left Toe Third: Change dressing every day. - HHRN to change dressing three times weekly and [facility] will change dressing once and family will attempt to change dressing the other days Follow-up Appointments: Wound #1 Right Toe Second: Return Appointment in 1 week. Wound #2 Left Toe Third: Return Appointment in 1 week. Home Health: Wound #1 Right Toe Second: [place] [facility] [place] Nurse may visit PRN to address patient s wound care needs. FACE TO FACE ENCOUNTER: MEDICARE and MEDICAID PATIENTS: I certify that this patient is under my care and that I had a face-to-face encounter that meets the physician face-to-face encounter requirements with this patient on this date. The encounter with the patient was in whole or in part for the following MEDICAL CONDITION: (primary reason for [place]) MEDICAL NECESSITY: I certify, that based on my findings, NURSING services are a medically necessary home health service. HOME BOUND STATUS: I certify that my clinical findings support that this patient is homebound (i.e., Due to illness or injury, pt requires aid of supportive devices such as crutches, cane, wheelchairs, walkers, the use of special transportation or the assistance of another person to leave their place of residence. There is a normal inability to leave the home and doing so requires considerable and taxing effort. Other absences are for medical reasons / religious services and are infrequent or of short duration when for other reasons). If current dressing causes regression in wound condition, may D/C ordered dressing product/s and apply Normal Saline Moist Dressing daily until next [place] / Other MD appointment. [place] of regression in wound condition at [phone]. Please direct any NON-WOUND related issues/requests for orders to patient's Primary Care Physician Wound #2 Left Toe Third: [place] for [place] Nurse may visit PRN to address patient s wound care  needs. FACE  TO FACE ENCOUNTER: MEDICARE and MEDICAID PATIENTS: I certify that this patient is under my care and that I had a face-to-face encounter that meets the physician face-to-face encounter requirements with this patient on this date. The encounter with the patient was in whole or in part for the following MEDICAL CONDITION: (primary reason for [place]) MEDICAL NECESSITY: I certify, that based on my findings, NURSING services are a medically necessary home health service. HOME BOUND STATUS: I certify that my clinical findings support that this patient is homebound (i.e., Due to illness or injury, pt requires aid of supportive devices such as crutches, cane, wheelchairs, walkers, the use of special transportation or the assistance of another person to leave their place of residence. There is a normal inability to leave the home and doing so requires considerable and taxing effort. Other absences are for medical reasons / religious services and are infrequent or of short duration when for other reasons). If current dressing causes regression in wound condition, may D/C ordered dressing product/s and apply Normal Saline Moist Dressing daily until next [place] / Other MD appointment. [place] of regression in wound condition at [phone]. Please direct any NON-WOUND related issues/requests for orders to patient's Primary Care Physician Medications-please add to medication list.: Wound #1 Right Toe Second: Santyl Enzymatic Ointment Wound #2 Left Toe Third: Santyl Enzymatic Ointment The following medication(s) was prescribed: Levaquin oral 750 mg tablet 1 1 tablet oral taken 1 time a day for 14 days starting 10/17/2017 Meiser, LONZO SAULTER ([id number]) At this point my suggestion is going to be that we actually go ahead and continue with the Santyl as the treatment of choice over the next week. The patient is discontinuing the Keflex he really did not  take this appropriately anyway and based on my evaluation during his visit today I did not feel that was necessary. With that being said I did after the patient had left the clinic later in the morning received the final culture results from the wound center in [place] where he had previously attended wound care prior to coming to see me. This did reveal that the patient grew staph aureus as well is pseudomonas. Both were sensitive based on culture results to Levaquin. For that reason I am going to place the patient on Levaquin today in order to go ahead and get this started and hopefully treat the infections noted. Patient's family will be contacted to revise that the prescription has been sent in. Otherwise he does have an angiogram coming up this coming Monday he also is scheduled for his CT scans next week as well and then the following Monday has an angiogram of the opposite side according to his daughter. We will see what all of this shows. Please see above for specific wound care orders. We will see patient for re-evaluation in 1 week(s) here in the clinic. If anything worsens or changes patient will contact our office for additional recommendations. Electronic Signature(s) Signed: 10/17/2017 8:54:03 AM By: Worthy Keeler PA-C Entered By: Worthy Keeler on 10/17/2017 08:15:31 Bentler, [name] ([id number]) -------------------------------------------------------------------------------- ROS/PFSH Details Patient Name: Beane, Alessio S. Date of Service: 10/16/2017 11:15 AM Medical Record Number: [id number] Patient Account Number: [id number] Date of Birth/Sex: 07-11-1924 (84 y.o. M) Treating RN: Roger Shelter Primary Care Provider: Deborra Medina Other Clinician: Referring Provider: Deborra Medina Treating Provider/Extender: Melburn Hake, HOYT Weeks in Treatment: 0 Information Obtained From Patient Wound History Constitutional Symptoms (General Health) Complaints and Symptoms: Negative  for: Fever; Chills Eyes Medical History: Positive for: Cataracts Negative for: Glaucoma; Optic Neuritis Ear/Nose/Mouth/Throat Medical History: Negative for: Chronic sinus problems/congestion; Middle ear problems Hematologic/Lymphatic Medical History: Positive for: Lymphedema Negative for: Anemia; Hemophilia; Human Immunodeficiency Virus; Sickle Cell Disease Respiratory Complaints and Symptoms: No Complaints or Symptoms Medical History: Negative for: Aspiration; Asthma; Chronic Obstructive Pulmonary Disease (COPD); Pneumothorax; Sleep Apnea; Tuberculosis Cardiovascular Complaints and Symptoms: No Complaints or Symptoms Medical History: Positive for: Coronary Artery Disease; Hypertension; Peripheral Venous Disease Negative for: Angina; Arrhythmia; Congestive Heart Failure; Deep Vein Thrombosis; Hypotension; Myocardial Infarction; Peripheral Arterial Disease; Phlebitis; Vasculitis Past Medical History Notes: pacemaker, 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 ([id number]) Medical History: Negative for: Cirrhosis ; Colitis; Crohnos; Hepatitis A; Hepatitis B; Hepatitis C Endocrine Medical History: Positive for: Type II Diabetes - since 1988 Negative for: Type I Diabetes Treated with: Insulin Blood sugar tested every day: Yes Tested : 3 times daily Blood sugar testing results: Breakfast: 114 Genitourinary Medical History: Positive for: End Stage Renal Disease Immunological Medical History: Negative for: Lupus Erythematosus; Raynaudos; Scleroderma Integumentary (Skin) Medical History: Negative for: History of Burn; History of pressure wounds Musculoskeletal Medical History: Positive for: Rheumatoid Arthritis Negative for: Gout; Osteoarthritis; Osteomyelitis Neurologic Medical History: Positive for: Neuropathy - feet and legs Negative for: Dementia; Quadriplegia; Paraplegia; Seizure Disorder Psychiatric Complaints and  Symptoms: No Complaints or Symptoms Medical History: Negative for: Anorexia/bulimia; Confinement Anxiety HBO Extended History Items Eyes: Cataracts Immunizations Pneumococcal Vaccine: Received Pneumococcal Vaccination: No Implantable Devices Alanis, DAISHAUN AYRE ([id number]) Family and Social History Former smoker; Marital Status - Widowed; Alcohol Use: Rarely; Drug Use: No History; Caffeine Use: Never; Financial Concerns: No; Food, Clothing or Shelter Needs: No; Support System Lacking: No; Transportation Concerns: No; Advanced Directives: Yes (Not Provided); Patient does not want information on Advanced Directives; Do not resuscitate: No; Living Will: Yes (Not Provided); Medical Power of Attorney: Yes (Not Provided) Physician Affirmation I have reviewed and agree with the above information. Electronic Signature(s) Signed: 10/17/2017 8:12:43 AM By: Roger Shelter Signed: 10/17/2017 8:54:03 AM By: Worthy Keeler PA-C Entered By: Worthy Keeler on 10/17/2017 08:11:58 Esquer, [name] ([id number]) -------------------------------------------------------------------------------- SuperBill Details Patient Name: Tippins, Gor S. Date of Service: 10/16/2017 Medical Record Number: [id number] Patient Account Number: [id number] Date of Birth/Sex: 12/08/1924 (84 y.o. 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 N18.9 Chronic kidney disease, unspecified I73.9 Peripheral vascular disease,  unspecified L54.49 Chronic systolic (congestive) heart failure I10 Essential (primary) hypertension Facility Procedures CPT4 Code: 20100712 Description: 19758 - WOUND CARE VISIT-LEV 2 EST PT Modifier: Quantity: 1 Physician Procedures CPT4: Description Modifier Quantity Code [id number] - WC PHYS LEVEL 4 - EST PT 1 ICD-10 Diagnosis Description E11.621 Type 2 diabetes mellitus with foot ulcer L97.512 Non-pressure chronic ulcer of other part of right foot with fat layer exposed E11.40  Type 2 diabetes mellitus with diabetic neuropathy, unspecified 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

## 2017-10-21 NOTE — Telephone Encounter (Signed)
FYI

## 2017-10-21 NOTE — Progress Notes (Signed)
JEOFFREY, ELEAZER ([id number]) Visit Report for 10/16/2017 Arrival Information Details Patient Name: [name], [name]. Date of Service: 10/16/2017 11:15 AM Medical Record Number: [id number] Patient Account Number: [id number] Date of Birth/Sex: July 17, 1924 (84 y.o. M) Treating RN: Roger Shelter Primary Care Carsen Machi: Deborra Medina Other Clinician: Referring Hughey Rittenberry: Deborra Medina Treating Kadeisha Betsch/Extender: Melburn Hake, HOYT Weeks in Treatment: 0 Visit Information History Since Last Visit Added or deleted any medications: Yes Patient Arrived: Wheel Chair Any new allergies or adverse reactions: No Arrival Time: 11:18 Had a fall or experienced change in No Accompanied By: daughter activities of daily living that may affect Transfer Assistance: Manual risk of falls: Signs or symptoms of abuse/neglect since last visito No Hospitalized since last visit: No Implantable device outside of the clinic excluding No cellular tissue based products placed in the center since last visit: Has Dressing in Place as Prescribed: Yes Pain Present Now: No Electronic Signature([name]) Signed: 10/16/2017 1:01:16 PM By: Lorine Bears RCP, RRT, CHT Entered By: Becky Sax, Amado Nash on 10/16/2017 11:22:14 Pasqual, [name] ([id number]) -------------------------------------------------------------------------------- Clinic Level of Care Assessment Details Patient Name: [name], [name]. Date of Service: 10/16/2017 11:15 AM Medical Record Number: [id number] Patient Account Number: [id number] Date of Birth/Sex: 11-Jan-1925 (84 y.o. M) Treating RN: Roger Shelter Primary Care Norah Devin: Deborra Medina Other Clinician: Referring Pang Robers: Deborra Medina Treating Bowie Doiron/Extender: Melburn Hake, HOYT Weeks in Treatment: 0 Clinic Level of Care Assessment Items TOOL 4 Quantity Score X - Use when only an EandM is performed on FOLLOW-UP visit 1 0 ASSESSMENTS - Nursing Assessment / Reassessment X -  Reassessment of Co-morbidities (includes updates in patient status) 1 10 X- 1 5 Reassessment of Adherence to Treatment Plan ASSESSMENTS - Wound and Skin Assessment / Reassessment X - Simple Wound Assessment / Reassessment - one wound 1 5 []  - 0 Complex Wound Assessment / Reassessment - multiple wounds []  - 0 Dermatologic / Skin Assessment (not related to wound area) ASSESSMENTS - Focused Assessment []  - Circumferential Edema Measurements - multi extremities 0 []  - 0 Nutritional Assessment / Counseling / Intervention []  - 0 Lower Extremity Assessment (monofilament, tuning fork, pulses) []  - 0 Peripheral Arterial Disease Assessment (using hand held doppler) ASSESSMENTS - Ostomy and/or Continence Assessment and Care []  - Incontinence Assessment and Management 0 []  - 0 Ostomy Care Assessment and Management (repouching, etc.) PROCESS - Coordination of Care X - Simple Patient / Family Education for ongoing care 1 15 []  - 0 Complex (extensive) Patient / Family Education for ongoing care []  - 0 Staff obtains Programmer, systems, Records, Test Results / Process Orders []  - 0 Staff telephones HHA, Nursing Homes / Clarify orders / etc []  - 0 Routine Transfer to another Facility (non-emergent condition) []  - 0 Routine Hospital Admission (non-emergent condition) []  - 0 New Admissions / Biomedical engineer / Ordering NPWT, Apligraf, etc. []  - 0 Emergency Hospital Admission (emergent condition) X- 1 10 Simple Discharge Coordination An, Takota [name]. ([id number]) []  - 0 Complex (extensive) Discharge Coordination PROCESS - Special Needs []  - Pediatric / Minor Patient Management 0 []  - 0 Isolation Patient Management []  - 0 Hearing / Language / Visual special needs []  - 0 Assessment of Community assistance (transportation, D/C planning, etc.) []  - 0 Additional assistance / Altered mentation []  - 0 Support Surface([name]) Assessment (bed, cushion, seat, etc.) INTERVENTIONS - Wound Cleansing /  Measurement X - Simple Wound Cleansing - one wound 1 5 []  - 0 Complex Wound Cleansing - multiple wounds X- 1 5 Wound Imaging (photographs -  any number of wounds) []  - 0 Wound Tracing (instead of photographs) X- 1 5 Simple Wound Measurement - one wound []  - 0 Complex Wound Measurement - multiple wounds INTERVENTIONS - Wound Dressings X - Small Wound Dressing one or multiple wounds 1 10 []  - 0 Medium Wound Dressing one or multiple wounds []  - 0 Large Wound Dressing one or multiple wounds []  - 0 Application of Medications - topical []  - 0 Application of Medications - injection INTERVENTIONS - Miscellaneous []  - External ear exam 0 []  - 0 Specimen Collection (cultures, biopsies, blood, body fluids, etc.) []  - 0 Specimen([name]) / Culture([name]) sent or taken to Lab for analysis []  - 0 Patient Transfer (multiple staff / Civil Service fast streamer / Similar devices) []  - 0 Simple Staple / Suture removal (25 or less) []  - 0 Complex Staple / Suture removal (26 or more) []  - 0 Hypo / Hyperglycemic Management (close monitor of Blood Glucose) []  - 0 Ankle / Brachial Index (ABI) - do not check if billed separately X- 1 5 Vital Signs Huesca, Rashi [name]. ([id number]) Has the patient been seen at the hospital within the last three years: Yes Total Score: 75 Level Of Care: New/Established - Level 2 Electronic Signature([name]) Signed: 10/17/2017 8:12:43 AM By: Roger Shelter Entered By: Roger Shelter on 10/16/2017 12:12:06 Huisman, [name] ([id number]) -------------------------------------------------------------------------------- Encounter Discharge Information Details Patient Name: [name], [name]. Date of Service: 10/16/2017 11:15 AM Medical Record Number: [id number] Patient Account Number: [id number] Date of Birth/Sex: 25-May-1924 (84 y.o. M) Treating RN: Montey Hora Primary Care Aylana Hirschfeld: Deborra Medina Other Clinician: Referring Rehema Muffley: Deborra Medina Treating [place] Gustafson/Extender: Melburn Hake,  HOYT Weeks in Treatment: 0 Encounter Discharge Information Items Discharge Condition: Stable Ambulatory Status: Wheelchair Discharge Destination: Home Transportation: Private Auto Accompanied By: daughter Schedule Follow-up Appointment: Yes Clinical Summary of Care: Electronic Signature([name]) Signed: 10/16/2017 5:06:40 PM By: Montey Hora Entered By: Montey Hora on 10/16/2017 14:27:15 Frisinger, [name] ([id number]) -------------------------------------------------------------------------------- Lower Extremity Assessment Details Patient Name: Abeyta, Eyoel [name]. Date of Service: 10/16/2017 11:15 AM Medical Record Number: [id number] Patient Account Number: [id number] Date of Birth/Sex: 08-17-1924 (84 y.o. M) Treating RN: Secundino Ginger Primary Care Daniyal Tabor: Deborra Medina Other Clinician: Referring Zuly Belkin: Deborra Medina Treating Brittania Sudbeck/Extender: Melburn Hake, HOYT Weeks in Treatment: 0 Edema Assessment Assessed: Shirlyn Goltz: No] [Right: No] [Left: Edema] [Right: :] Calf Left: Right: Point of Measurement: 34 cm From Medial Instep 33 cm 35 cm Ankle Left: Right: Point of Measurement: 10 cm From Medial Instep 25 cm 27 cm Vascular Assessment Claudication: Claudication Assessment [Left:None] [Right:None] Pulses: Dorsalis Pedis Palpable: [Left:Yes] [Right:Yes] Posterior Tibial Extremity colors, hair growth, and conditions: Extremity Color: [Left:Normal] [Right:Normal] Temperature of Extremity: [Left:Warm] [Right:Warm] Capillary Refill: [Left:< 3 seconds] [Right:< 3 seconds] Notes missing toe nails . Electronic Signature([name]) Signed: 10/16/2017 12:06:16 PM By: Secundino Ginger Entered By: Secundino Ginger on 10/16/2017 11:45:15 Baughman, [name] ([id number]) -------------------------------------------------------------------------------- Multi Wound Chart Details Patient Name: Fluellen, Render [name]. Date of Service: 10/16/2017 11:15 AM Medical Record Number: [id number] Patient Account Number:  [id number] Date of Birth/Sex: Feb 02, 1925 (84 y.o. M) Treating RN: Roger Shelter Primary Care Yomara Toothman: Deborra Medina Other Clinician: Referring Chloee Tena: Deborra Medina Treating Corgan Mormile/Extender: Melburn Hake, HOYT Weeks in Treatment: 0 Vital Signs Height(in): 68 Pulse(bpm): 88 Weight(lbs): 180 Blood Pressure(mmHg): 130/43 Body Mass Index(BMI): 27 Temperature(F): 98.3 Respiratory Rate 18 (breaths/min): Photos: [N/A:N/A] Wound Location: Right Toe Second Left Toe Third N/A Wounding Event: Gradually Appeared Gradually Appeared N/A Primary Etiology: Diabetic Wound/Ulcer of the Diabetic Wound/Ulcer of the N/A Lower Extremity Lower  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057 0.503 N/A % Reduction in Area: 52.00% 41.80% N/A % Reduction in Volume: 75.80% 41.80% N/A Classification: Grade 2 Grade 2 N/A Exudate Amount: Small Small N/A Exudate Type: Serosanguineous Purulent N/A Exudate Color: red, brown yellow, brown, green N/A Wound Margin: Distinct, outline attached Indistinct, nonvisible N/A Granulation Amount: Small (1-33%) None Present (0%) N/A Kydd, Zaryan [name]. ([id number]) Granulation Quality: Red, Pink N/A N/A Necrotic Amount: Large (67-100%) Large (67-100%) N/A Necrotic Tissue: Eschar, Adherent Slough Eschar, Adherent Slough  N/A Exposed Structures: Fat Layer (Subcutaneous Fat Layer (Subcutaneous N/A Tissue) Exposed: Yes Tissue) Exposed: Yes Fascia: No Fascia: No Tendon: No Tendon: No Muscle: No Muscle: No Joint: No Joint: No Bone: No Bone: No Epithelialization: None None N/A Periwound Skin Texture: Excoriation: No Excoriation: No N/A Induration: No Induration: No Callus: No Callus: No Crepitus: No Crepitus: No Rash: No Rash: No Scarring: No Scarring: No Periwound Skin Moisture: Maceration: No Maceration: No N/A Dry/Scaly: No Dry/Scaly: No Periwound Skin Color: Atrophie Blanche: No Atrophie Blanche: No N/A Cyanosis: No Cyanosis: No Ecchymosis: No Ecchymosis: No Erythema: No Erythema: No Hemosiderin Staining: No Hemosiderin Staining: No Mottled: No Mottled: No Pallor: No Pallor: No Rubor: No Rubor: No Temperature: No Abnormality No Abnormality N/A Tenderness on Palpation: Yes Yes N/A Wound Preparation: Ulcer Cleansing: Ulcer Cleansing: N/A Rinsed/Irrigated with Saline Rinsed/Irrigated with Saline Topical Anesthetic Applied: Topical Anesthetic Applied: Other: lidocaince 4% Other: lidocaince 4% Treatment Notes Electronic Signature([name]) Signed: 10/17/2017 8:12:43 AM By: Roger Shelter Entered By: Roger Shelter on 10/16/2017 12:06:11 Allende, [name] ([id number]) -------------------------------------------------------------------------------- [place] Details Patient Name: Hickey, Precious [name]. Date of Service: 10/16/2017 11:15 AM Medical Record Number: [id number] Patient Account Number: [id number] Date of Birth/Sex: [birth year] (84 y.o. M) Treating RN: Roger Shelter Primary Care Koah Chisenhall: Deborra Medina Other Clinician: Referring Estus Krakowski: Deborra Medina Treating Danashia Landers/Extender: Melburn Hake, HOYT Weeks in Treatment: 0 Active Inactive ` Abuse / Safety / Falls / Self Care Management Nursing Diagnoses: Potential for falls Goals: Patient will  remain injury free related to falls Date Initiated: 10/10/2017 Target Resolution Date: 01/02/2018 Goal Status: Active Interventions: Assess fall risk on admission and as needed Notes: ` Orientation to the Wound Care Program Nursing Diagnoses: Knowledge deficit related to the wound healing center program Goals: Patient/caregiver will verbalize understanding of the [place] Date Initiated: 10/10/2017 Target Resolution Date: 01/02/2018 Goal Status: Active Interventions: Provide education on orientation to the wound center Notes: ` Pain, Acute or Chronic Nursing Diagnoses: Potential alteration in comfort, pain Goals: Patient/caregiver will verbalize adequate pain control between visits Date Initiated: 10/10/2017 Target Resolution Date: 01/02/2018 Goal Status: Active Interventions: Bartek, TRENNON TORBECK ([id number]) Complete pain assessment as per visit requirements Notes: ` Wound/Skin Impairment Nursing Diagnoses: Impaired tissue integrity Goals: Ulcer/skin breakdown will heal within 14 weeks Date Initiated: 10/10/2017 Target Resolution Date: 01/02/2018 Goal Status: Active Interventions: Assess patient/caregiver ability to obtain necessary supplies Assess patient/caregiver ability to perform ulcer/skin care regimen upon admission and as needed Assess ulceration([name]) every  visit Notes: Electronic Signature([name]) Signed: 10/17/2017 8:12:43 AM By: Roger Shelter Entered By: Roger Shelter on 10/16/2017 12:05:47 Delcastillo, [name] ([id number]) -------------------------------------------------------------------------------- Pain Assessment Details Patient Name: Dahan, Dmani [name]. Date of Service: 10/16/2017 11:15 AM Medical Record Number: [id number] Patient Account Number: [id number] Date of Birth/Sex: [birth year] (84 y.o. M) Treating RN: Roger Shelter Primary Care Avri Paiva: Deborra Medina Other Clinician: Referring Braedyn Riggle: Deborra Medina Treating  Delois Tolbert/Extender: Melburn Hake, HOYT Weeks in Treatment: 0 Active Problems Location of Pain Severity and Description of Pain Patient Has Paino No Site Locations Pain Management and Medication Current Pain Management: Electronic Signature([name]) Signed: 10/16/2017 1:01:16 PM By: Lorine Bears RCP, RRT, CHT Signed: 10/17/2017 8:12:43 AM By: Roger Shelter Entered By: Lorine Bears on 10/16/2017 11:22:24 Griggs, [name] ([id number]) -------------------------------------------------------------------------------- Patient/Caregiver Education Details Patient Name: Koppenhaver, Aldair [name]. Date of Service: 10/16/2017 11:15 AM Medical Record Number: [id number] Patient Account Number: [id number] Date of Birth/Gender: 1925/01/20 (84 y.o. M) Treating RN: Montey Hora Primary Care Physician: Deborra Medina Other Clinician: Referring Physician: Deborra Medina Treating Physician/Extender: Melburn Hake, HOYT Weeks in Treatment: 0 Education Assessment Education Provided To: Patient and Caregiver Education Topics Provided Wound/Skin Impairment: Handouts: Other: wound care as ordered Methods: Demonstration, Explain/Verbal Responses: State content correctly Electronic Signature([name]) Signed: 10/16/2017 5:06:40 PM By: Montey Hora Entered By: Montey Hora on 10/16/2017 14:27:33 Starkes, [name] ([id number]) -------------------------------------------------------------------------------- Wound Assessment Details Patient Name: Wettstein, Sevastian [name]. Date of Service: 10/16/2017 11:15 AM Medical Record Number: [id number] Patient Account Number: [id number] Date of Birth/Sex: 11/14/1924 (84 y.o. M) Treating RN: Secundino Ginger Primary Care Yossi Hinchman: Deborra Medina Other Clinician: Referring Amarylis Rovito: Deborra Medina Treating Nayla Dias/Extender: Melburn Hake, HOYT Weeks in Treatment: 0 Wound Status Wound Number: 1 Primary Diabetic Wound/Ulcer of the Lower Extremity Etiology: Wound Location: Right  Toe Second Secondary Arterial Insufficiency Ulcer Wounding Event: Gradually Appeared Etiology: Date Acquired: 06/04/2017 Wound Open Weeks Of Treatment: 0 Status: Clustered Wound: No Comorbid Cataracts, Lymphedema, Coronary Artery Pending Amputation On Presentation History: Disease, Hypertension, Peripheral Venous Disease, Type II Diabetes, End Stage Renal Disease, Rheumatoid Arthritis, Neuropathy Photos Photo Uploaded By: Secundino Ginger on 10/16/2017 11:57:50 Wound Measurements Length: (cm) 0.8 % Reduction Width: (cm) 0.9 % Reduction Depth: (cm) 0.1 Epitheliali Area: (cm) 0.565 Tunneling: Volume: (cm) 0.057 Underminin in Area: 52% in Volume: 75.8% zation: None No g: No Wound Description Classification: Grade 2 Foul Odor Wound Margin: Distinct, outline attached Slough/Fib Exudate Amount: Small Exudate Type: Serosanguineous Exudate Color: red, brown After Cleansing: No rino Yes Wound Bed Granulation Amount: Small (1-33%) Exposed Structure Granulation Quality: Red, Pink Fascia Exposed: No Necrotic Amount: Large (67-100%) Fat Layer (Subcutaneous Tissue) Exposed: Yes Necrotic Quality: Eschar, Adherent Slough Tendon Exposed: No Muscle Exposed: No Joint Exposed: No Naraine, Ido [name]. ([id number]) Bone Exposed: No Periwound Skin Texture Texture Color No Abnormalities Noted: No No Abnormalities Noted: No Callus: No Atrophie Blanche: No Crepitus: No Cyanosis: No Excoriation: No Ecchymosis: No Induration: No Erythema: No Rash: No Hemosiderin Staining: No Scarring: No Mottled: No Pallor: No Moisture Rubor: No No Abnormalities Noted: No Dry / Scaly: No Temperature / Pain Maceration: No Temperature: No Abnormality Tenderness on Palpation: Yes Wound Preparation Ulcer Cleansing: Rinsed/Irrigated with Saline Topical Anesthetic Applied: Other: lidocaince 4%, Treatment Notes Wound #1 (Right Toe Second) 1. Cleansed with: Clean wound with Normal Saline 2.  Anesthetic Topical Lidocaine 4% cream to wound bed prior to debridement 4. Dressing Applied: Santyl Ointment 5. Secondary Dressing Applied Dry Gauze Kerlix/Conform 7. Secured with Recruitment consultant) Signed: 10/16/2017 12:06:16 PM By: Secundino Ginger  Entered By: Secundino Ginger on 10/16/2017 11:39:59 Kloster, [name] ([id number]) -------------------------------------------------------------------------------- Wound Assessment Details Patient Name: Coll, BRIX BREARLEY. Date of Service: 10/16/2017 11:15 AM Medical Record Number: [id number] Patient Account Number: [id number] Date of Birth/Sex: May 11, 1924 (84 y.o. M) Treating RN: Secundino Ginger Primary Care Pravin Perezperez: Deborra Medina Other Clinician: Referring Janvi Ammar: Deborra Medina Treating Latamara Melder/Extender: Melburn Hake, HOYT Weeks in Treatment: 0 Wound Status Wound Number: 2 Primary Diabetic Wound/Ulcer of the Lower Extremity Etiology: Wound Location: Left Toe Third Secondary Arterial Insufficiency Ulcer Wounding Event: Gradually Appeared Etiology: Date Acquired: 06/04/2017 Wound Open Weeks Of Treatment: 0 Status: Clustered Wound: No Comorbid Cataracts, Lymphedema, Coronary Artery Pending Amputation On Presentation History: Disease, Hypertension, Peripheral Venous Disease, Type II Diabetes, End Stage Renal Disease, Rheumatoid Arthritis, Neuropathy Photos Photo Uploaded By: Secundino Ginger on 10/16/2017 11:57:51 Wound Measurements Length: (cm) 2 Width: (cm) 1.6 Depth: (cm) 0.2 Area: (cm) 2.513 Volume: (cm) 0.503 % Reduction in Area: 41.8% % Reduction in Volume: 41.8% Epithelialization: None Tunneling: No Undermining: No Wound Description Classification: Grade 2 Foul Odor A Wound Margin: Indistinct, nonvisible Slough/Fibr Exudate Amount: Small Exudate Type: Purulent Exudate Color: yellow, brown, green fter Cleansing: No ino Yes Wound Bed Granulation Amount: None Present (0%) Exposed Structure Necrotic Amount: Large  (67-100%) Fascia Exposed: No Necrotic Quality: Eschar, Adherent Slough Fat Layer (Subcutaneous Tissue) Exposed: Yes Tendon Exposed: No Muscle Exposed: No Joint Exposed: No Kjos, Sylvanus [name]. ([id number]) Bone Exposed: No Periwound Skin Texture Texture Color No Abnormalities Noted: No No Abnormalities Noted: No Callus: No Atrophie Blanche: No Crepitus: No Cyanosis: No Excoriation: No Ecchymosis: No Induration: No Erythema: No Rash: No Hemosiderin Staining: No Scarring: No Mottled: No Pallor: No Moisture Rubor: No No Abnormalities Noted: No Dry / Scaly: No Temperature / Pain Maceration: No Temperature: No Abnormality Tenderness on Palpation: Yes Wound Preparation Ulcer Cleansing: Rinsed/Irrigated with Saline Topical Anesthetic Applied: Other: lidocaince 4%, Treatment Notes Wound #2 (Left Toe Third) 1. Cleansed with: Clean wound with Normal Saline 2. Anesthetic Topical Lidocaine 4% cream to wound bed prior to debridement 4. Dressing Applied: Santyl Ointment 5. Secondary Dressing Applied Dry Gauze Kerlix/Conform 7. Secured with Recruitment consultant) Signed: 10/16/2017 12:06:16 PM By: Secundino Ginger Entered By: Secundino Ginger on 10/16/2017 11:41:44 Collyer, [name] ([id number]) -------------------------------------------------------------------------------- [place] Details Patient Name: Deguire, Bilal [name]. Date of Service: 10/16/2017 11:15 AM Medical Record Number: [id number] Patient Account Number: [id number] Date of Birth/Sex: [birth year] (84 y.o. M) Treating RN: Roger Shelter Primary Care Jhase Creppel: Deborra Medina Other Clinician: Referring Nikolas Casher: Deborra Medina Treating Ilisa Hayworth/Extender: Melburn Hake, HOYT Weeks in Treatment: 0 Vital Signs Time Taken: 11:23 Temperature (F): 98.3 Height (in): 68 Pulse (bpm): 66 Source: Stated Respiratory Rate (breaths/min): 18 Weight (lbs): 180 Blood Pressure (mmHg): 130/43 Source: Stated Reference Range: 80 - 120 mg  / dl Body Mass Index (BMI): 27.4 Electronic Signature([name]) Signed: 10/16/2017 1:01:16 PM By: Lorine Bears RCP, RRT, CHT Entered By: Becky Sax, Amado Nash on 10/16/2017 11:25:04

## 2017-10-22 ENCOUNTER — Telehealth: Payer: Self-pay

## 2017-10-22 ENCOUNTER — Ambulatory Visit
Admission: RE | Admit: 2017-10-22 | Discharge: 2017-10-22 | Disposition: A | Discharge status: Home / Self Care | Payer: Medicare Other | Source: Ambulatory Visit | Attending: Physician Assistant | Admitting: Physician Assistant

## 2017-10-22 ENCOUNTER — Telehealth (INDEPENDENT_AMBULATORY_CARE_PROVIDER_SITE_OTHER): Payer: Self-pay

## 2017-10-22 DIAGNOSIS — E1169 Type 2 diabetes mellitus with other specified complication: Secondary | ICD-10-CM | POA: Insufficient documentation

## 2017-10-22 DIAGNOSIS — M869 Osteomyelitis, unspecified: Secondary | ICD-10-CM | POA: Insufficient documentation

## 2017-10-22 DIAGNOSIS — Z515 Encounter for palliative care: Secondary | ICD-10-CM | POA: Diagnosis not present

## 2017-10-22 DIAGNOSIS — M7989 Other specified soft tissue disorders: Secondary | ICD-10-CM | POA: Diagnosis not present

## 2017-10-22 DIAGNOSIS — E11621 Type 2 diabetes mellitus with foot ulcer: Secondary | ICD-10-CM

## 2017-10-22 DIAGNOSIS — L97509 Non-pressure chronic ulcer of other part of unspecified foot with unspecified severity: Secondary | ICD-10-CM | POA: Diagnosis not present

## 2017-10-22 DIAGNOSIS — I739 Peripheral vascular disease, unspecified: Secondary | ICD-10-CM | POA: Diagnosis not present

## 2017-10-22 NOTE — Telephone Encounter (Signed)
Copied from [place] [id number]. Topic: General - Other >> Oct 22, 2017  4:09 PM Berneta Levins wrote: Reason for CRM:   Magda Paganini from [facility] calling regarding medication recommendations for pain management. Magda Paganini can be reached at [phone]

## 2017-10-22 NOTE — Telephone Encounter (Signed)
Please contact [name] son and let him know that sometimes pain after angiogram is common, due to reperfusion of the lower extremities as well as discomfort from stretching of the artery.  To help relieve the pain he can elevate his foot to assist with swelling, as well as wearing mild 10 to 15 mmHg compression socks.  He can also take over-the-counter Tylenol as well to try to give relief to the pain.  If these interventions do not work we can try some low-dose tramadol to help relieve the pain.

## 2017-10-22 NOTE — Telephone Encounter (Signed)
Called the son back to give him the advice from DR. Dew. The patient reports very little pain this morning, and he will be elevating more above heart level during the day when he is sitting. They will call back if things don't get any better.

## 2017-10-22 NOTE — Telephone Encounter (Signed)
Spoke with patient's son and gave him Eulogio Ditch NP recommendations, the son stated that being horizontal makes the pain worse and he is on a cocktail of Hydrocodone and Tramadol. The son also let me know that the patient's pain is much better today.

## 2017-10-22 NOTE — Telephone Encounter (Signed)
Mikki Santee the patient's son called stating that the patient is having pain in his foot after his leg angio procedure on 10/20/17.

## 2017-10-23 ENCOUNTER — Other Ambulatory Visit: Payer: Self-pay | Admitting: Internal Medicine

## 2017-10-23 ENCOUNTER — Encounter: Payer: Medicare Other | Admitting: Physician Assistant

## 2017-10-23 DIAGNOSIS — I10 Essential (primary) hypertension: Secondary | ICD-10-CM | POA: Diagnosis not present

## 2017-10-23 DIAGNOSIS — Z794 Long term (current) use of insulin: Secondary | ICD-10-CM | POA: Diagnosis not present

## 2017-10-23 DIAGNOSIS — I70203 Unspecified atherosclerosis of native arteries of extremities, bilateral legs: Secondary | ICD-10-CM | POA: Diagnosis not present

## 2017-10-23 DIAGNOSIS — M79604 Pain in right leg: Secondary | ICD-10-CM | POA: Diagnosis not present

## 2017-10-23 DIAGNOSIS — E11621 Type 2 diabetes mellitus with foot ulcer: Secondary | ICD-10-CM | POA: Diagnosis not present

## 2017-10-23 DIAGNOSIS — L97513 Non-pressure chronic ulcer of other part of right foot with necrosis of muscle: Secondary | ICD-10-CM | POA: Diagnosis not present

## 2017-10-23 NOTE — Telephone Encounter (Signed)
LMTCB with Magda Paganini from [facility]. Please transfer Magda Paganini to our office.

## 2017-10-23 NOTE — Telephone Encounter (Signed)
Last OV 10/16/2017   Sent to PCP to advise

## 2017-10-23 NOTE — Telephone Encounter (Signed)
meds refilled, but the gabapentin was sent in on agust 15?

## 2017-10-24 ENCOUNTER — Encounter: Payer: Medicare Other | Admitting: Physician Assistant

## 2017-10-24 DIAGNOSIS — L97522 Non-pressure chronic ulcer of other part of left foot with fat layer exposed: Secondary | ICD-10-CM | POA: Diagnosis not present

## 2017-10-24 DIAGNOSIS — E1140 Type 2 diabetes mellitus with diabetic neuropathy, unspecified: Secondary | ICD-10-CM | POA: Diagnosis not present

## 2017-10-24 DIAGNOSIS — L97526 Non-pressure chronic ulcer of other part of left foot with bone involvement without evidence of necrosis: Secondary | ICD-10-CM | POA: Diagnosis not present

## 2017-10-24 DIAGNOSIS — E11621 Type 2 diabetes mellitus with foot ulcer: Secondary | ICD-10-CM | POA: Diagnosis not present

## 2017-10-24 DIAGNOSIS — E1122 Type 2 diabetes mellitus with diabetic chronic kidney disease: Secondary | ICD-10-CM | POA: Diagnosis not present

## 2017-10-24 DIAGNOSIS — I132 Hypertensive heart and chronic kidney disease with heart failure and with stage 5 chronic kidney disease, or end stage renal disease: Secondary | ICD-10-CM | POA: Diagnosis not present

## 2017-10-24 DIAGNOSIS — M86671 Other chronic osteomyelitis, right ankle and foot: Secondary | ICD-10-CM | POA: Diagnosis not present

## 2017-10-24 DIAGNOSIS — L97512 Non-pressure chronic ulcer of other part of right foot with fat layer exposed: Secondary | ICD-10-CM | POA: Diagnosis not present

## 2017-10-25 DIAGNOSIS — Z794 Long term (current) use of insulin: Secondary | ICD-10-CM | POA: Diagnosis not present

## 2017-10-25 DIAGNOSIS — M79604 Pain in right leg: Secondary | ICD-10-CM | POA: Diagnosis not present

## 2017-10-25 DIAGNOSIS — E11621 Type 2 diabetes mellitus with foot ulcer: Secondary | ICD-10-CM | POA: Diagnosis not present

## 2017-10-25 DIAGNOSIS — L97513 Non-pressure chronic ulcer of other part of right foot with necrosis of muscle: Secondary | ICD-10-CM | POA: Diagnosis not present

## 2017-10-25 DIAGNOSIS — I70203 Unspecified atherosclerosis of native arteries of extremities, bilateral legs: Secondary | ICD-10-CM | POA: Diagnosis not present

## 2017-10-25 DIAGNOSIS — I10 Essential (primary) hypertension: Secondary | ICD-10-CM | POA: Diagnosis not present

## 2017-10-26 ENCOUNTER — Other Ambulatory Visit (INDEPENDENT_AMBULATORY_CARE_PROVIDER_SITE_OTHER): Payer: Self-pay | Admitting: Nurse Practitioner

## 2017-10-27 ENCOUNTER — Encounter
Admission: RE | Disposition: A | Discharge status: Home / Self Care | Payer: Self-pay | Source: Ambulatory Visit | Attending: Vascular Surgery

## 2017-10-27 ENCOUNTER — Inpatient Hospital Stay: Admit: 2017-10-27 | Payer: Medicare Other

## 2017-10-27 ENCOUNTER — Other Ambulatory Visit (INDEPENDENT_AMBULATORY_CARE_PROVIDER_SITE_OTHER): Payer: Self-pay | Admitting: Nurse Practitioner

## 2017-10-27 ENCOUNTER — Ambulatory Visit
Admission: RE | Admit: 2017-10-27 | Discharge: 2017-10-27 | Disposition: A | Discharge status: Home / Self Care | Payer: Medicare Other | Source: Ambulatory Visit | Attending: Vascular Surgery | Admitting: Vascular Surgery

## 2017-10-27 DIAGNOSIS — Z955 Presence of coronary angioplasty implant and graft: Secondary | ICD-10-CM | POA: Insufficient documentation

## 2017-10-27 DIAGNOSIS — Z888 Allergy status to other drugs, medicaments and biological substances status: Secondary | ICD-10-CM | POA: Insufficient documentation

## 2017-10-27 DIAGNOSIS — I70299 Other atherosclerosis of native arteries of extremities, unspecified extremity: Secondary | ICD-10-CM

## 2017-10-27 DIAGNOSIS — Z981 Arthrodesis status: Secondary | ICD-10-CM | POA: Insufficient documentation

## 2017-10-27 DIAGNOSIS — Z87891 Personal history of nicotine dependence: Secondary | ICD-10-CM | POA: Diagnosis not present

## 2017-10-27 DIAGNOSIS — L97521 Non-pressure chronic ulcer of other part of left foot limited to breakdown of skin: Secondary | ICD-10-CM | POA: Insufficient documentation

## 2017-10-27 DIAGNOSIS — I129 Hypertensive chronic kidney disease with stage 1 through stage 4 chronic kidney disease, or unspecified chronic kidney disease: Secondary | ICD-10-CM | POA: Diagnosis not present

## 2017-10-27 DIAGNOSIS — Z95820 Peripheral vascular angioplasty status with implants and grafts: Secondary | ICD-10-CM | POA: Diagnosis not present

## 2017-10-27 DIAGNOSIS — Z833 Family history of diabetes mellitus: Secondary | ICD-10-CM | POA: Diagnosis not present

## 2017-10-27 DIAGNOSIS — Z8249 Family history of ischemic heart disease and other diseases of the circulatory system: Secondary | ICD-10-CM | POA: Insufficient documentation

## 2017-10-27 DIAGNOSIS — I70245 Atherosclerosis of native arteries of left leg with ulceration of other part of foot: Secondary | ICD-10-CM | POA: Insufficient documentation

## 2017-10-27 DIAGNOSIS — E785 Hyperlipidemia, unspecified: Secondary | ICD-10-CM | POA: Diagnosis not present

## 2017-10-27 DIAGNOSIS — E1151 Type 2 diabetes mellitus with diabetic peripheral angiopathy without gangrene: Secondary | ICD-10-CM | POA: Insufficient documentation

## 2017-10-27 DIAGNOSIS — L97511 Non-pressure chronic ulcer of other part of right foot limited to breakdown of skin: Secondary | ICD-10-CM | POA: Insufficient documentation

## 2017-10-27 DIAGNOSIS — N184 Chronic kidney disease, stage 4 (severe): Secondary | ICD-10-CM | POA: Insufficient documentation

## 2017-10-27 DIAGNOSIS — L97909 Non-pressure chronic ulcer of unspecified part of unspecified lower leg with unspecified severity: Secondary | ICD-10-CM

## 2017-10-27 DIAGNOSIS — E1122 Type 2 diabetes mellitus with diabetic chronic kidney disease: Secondary | ICD-10-CM | POA: Insufficient documentation

## 2017-10-27 DIAGNOSIS — Z9889 Other specified postprocedural states: Secondary | ICD-10-CM | POA: Diagnosis not present

## 2017-10-27 DIAGNOSIS — Z85828 Personal history of other malignant neoplasm of skin: Secondary | ICD-10-CM | POA: Diagnosis not present

## 2017-10-27 DIAGNOSIS — Z950 Presence of cardiac pacemaker: Secondary | ICD-10-CM | POA: Insufficient documentation

## 2017-10-27 DIAGNOSIS — Z8613 Personal history of malaria: Secondary | ICD-10-CM | POA: Diagnosis not present

## 2017-10-27 DIAGNOSIS — I70235 Atherosclerosis of native arteries of right leg with ulceration of other part of foot: Secondary | ICD-10-CM | POA: Diagnosis not present

## 2017-10-27 DIAGNOSIS — L97519 Non-pressure chronic ulcer of other part of right foot with unspecified severity: Secondary | ICD-10-CM | POA: Diagnosis not present

## 2017-10-27 DIAGNOSIS — I7092 Chronic total occlusion of artery of the extremities: Secondary | ICD-10-CM | POA: Diagnosis not present

## 2017-10-27 DIAGNOSIS — Z951 Presence of aortocoronary bypass graft: Secondary | ICD-10-CM | POA: Diagnosis not present

## 2017-10-27 DIAGNOSIS — E11621 Type 2 diabetes mellitus with foot ulcer: Secondary | ICD-10-CM | POA: Insufficient documentation

## 2017-10-27 HISTORY — PX: LOWER EXTREMITY ANGIOGRAPHY: CATH118251

## 2017-10-27 LAB — CREATININE, SERUM
Creatinine, Ser: 2.48 mg/dL — ABNORMAL HIGH (ref 0.61–1.24)
GFR calc non Af Amer: 21 mL/min — ABNORMAL LOW (ref >60)
GFR, EST AFRICAN AMERICAN: 25 mL/min — AB (ref >60)

## 2017-10-27 LAB — GLUCOSE, CAPILLARY
GLUCOSE-CAPILLARY: 141 mg/dL — AB (ref 70–99)
GLUCOSE-CAPILLARY: 162 mg/dL — AB (ref 70–99)

## 2017-10-27 LAB — BUN: BUN: 54 mg/dL — ABNORMAL HIGH (ref 8–23)

## 2017-10-27 SURGERY — LOWER EXTREMITY ANGIOGRAPHY
Anesthesia: Moderate Sedation | Laterality: Right

## 2017-10-27 MED ORDER — HEPARIN (PORCINE) IN NACL 1000-0.9 UT/500ML-% IV SOLN
INTRAVENOUS | Status: AC
  Filled 2017-10-27: qty 1000

## 2017-10-27 MED ORDER — SODIUM CHLORIDE 0.9% FLUSH: 3.0000 mL | Freq: Two times a day (BID) | INTRAVENOUS | Status: DC

## 2017-10-27 MED ORDER — ONDANSETRON HCL 4 MG/2ML IJ SOLN: 4.0000 mg | Freq: Four times a day (QID) | INTRAMUSCULAR | Status: DC | PRN

## 2017-10-27 MED ORDER — HYDRALAZINE HCL 20 MG/ML IJ SOLN: 5.0000 mg | INTRAMUSCULAR | Status: DC | PRN

## 2017-10-27 MED ORDER — SODIUM CHLORIDE 0.9 % IV SOLN
INTRAVENOUS | Status: DC
  Administered 2017-10-27: 08:00:00 via INTRAVENOUS

## 2017-10-27 MED ORDER — SODIUM CHLORIDE 0.9 % IV SOLN: 250.0000 mL | INTRAVENOUS | Status: DC | PRN

## 2017-10-27 MED ORDER — ACETAMINOPHEN 325 MG PO TABS: 650.0000 mg | ORAL_TABLET | ORAL | Status: DC | PRN

## 2017-10-27 MED ORDER — LABETALOL HCL 5 MG/ML IV SOLN: 10.0000 mg | INTRAVENOUS | Status: DC | PRN

## 2017-10-27 MED ORDER — LIDOCAINE-EPINEPHRINE (PF) 1 %-1:200000 IJ SOLN
INTRAMUSCULAR | Status: AC
  Filled 2017-10-27: qty 30

## 2017-10-27 MED ORDER — DEXTROSE 5 % IV SOLN
2.0000 g | Freq: Once | INTRAVENOUS | Status: DC
  Administered 2017-10-27: 2 g via INTRAVENOUS

## 2017-10-27 MED ORDER — HEPARIN SODIUM (PORCINE) 1000 UNIT/ML IJ SOLN
INTRAMUSCULAR | Status: DC | PRN
  Administered 2017-10-27: 5000 [IU] via INTRAVENOUS

## 2017-10-27 MED ORDER — HYDROMORPHONE HCL 1 MG/ML IJ SOLN: 1.0000 mg | Freq: Once | INTRAMUSCULAR | Status: DC | PRN

## 2017-10-27 MED ORDER — MIDAZOLAM HCL 2 MG/2ML IJ SOLN
INTRAMUSCULAR | Status: DC | PRN
  Administered 2017-10-27 (×2): 1 mg via INTRAVENOUS

## 2017-10-27 MED ORDER — IOPAMIDOL (ISOVUE-300) INJECTION 61%
INTRAVENOUS | Status: DC | PRN
  Administered 2017-10-27: 60 mL via INTRAVENOUS

## 2017-10-27 MED ORDER — FENTANYL CITRATE (PF) 100 MCG/2ML IJ SOLN
INTRAMUSCULAR | Status: AC
  Filled 2017-10-27: qty 2

## 2017-10-27 MED ORDER — CEFAZOLIN SODIUM-DEXTROSE 2-4 GM/100ML-% IV SOLN
INTRAVENOUS | Status: AC
  Filled 2017-10-27: qty 100

## 2017-10-27 MED ORDER — FENTANYL CITRATE (PF) 100 MCG/2ML IJ SOLN
INTRAMUSCULAR | Status: DC | PRN
  Administered 2017-10-27 (×2): 25 ug via INTRAVENOUS

## 2017-10-27 MED ORDER — MIDAZOLAM HCL 5 MG/5ML IJ SOLN
INTRAMUSCULAR | Status: AC
  Filled 2017-10-27: qty 5

## 2017-10-27 MED ORDER — SODIUM CHLORIDE 0.9% FLUSH: 3.0000 mL | INTRAVENOUS | Status: DC | PRN

## 2017-10-27 MED ORDER — CEFAZOLIN SODIUM-DEXTROSE 2-4 GM/100ML-% IV SOLN: 2.0000 g | Freq: Once | INTRAVENOUS | Status: DC

## 2017-10-27 MED ORDER — HEPARIN SODIUM (PORCINE) 1000 UNIT/ML IJ SOLN
INTRAMUSCULAR | Status: AC
  Filled 2017-10-27: qty 1

## 2017-10-27 MED ORDER — SODIUM CHLORIDE 0.9 % IV SOLN: INTRAVENOUS | Status: DC

## 2017-10-27 SURGICAL SUPPLY — 23 items
BALLN ULTRVRSE 2.5X300X150 (BALLOONS) ×3
BALLOON ULTRVRSE 2.5X300X150 (BALLOONS) ×1 IMPLANT
CATH BEACON 5 .035 65 RIM TIP (CATHETERS) IMPLANT
CATH BEACON 5 .038 100 VERT TP (CATHETERS) ×3 IMPLANT
CATH CXI 4F 90 DAV (CATHETERS) ×3 IMPLANT
CATH CXI SUPP ANG 2.6FR 150CM (CATHETERS) ×3 IMPLANT
CATH CXI SUPP ANG 4FR 135 (CATHETERS) ×1 IMPLANT
CATH CXI SUPP ANG 4FR 135CM (CATHETERS) ×3
CATH PIG 70CM (CATHETERS) ×3 IMPLANT
CATH SIZING 5F 100CM .035 PIG (CATHETERS) ×3 IMPLANT
CATH VS15FR (CATHETERS) ×3 IMPLANT
DEVICE PRESTO INFLATION (MISCELLANEOUS) ×3 IMPLANT
DEVICE STARCLOSE SE CLOSURE (Vascular Products) ×3 IMPLANT
GLIDEWIRE ADV .035X180CM (WIRE) ×6 IMPLANT
GUIDEWIRE PFTE-COATED .018X300 (WIRE) ×3 IMPLANT
PACK ANGIOGRAPHY (CUSTOM PROCEDURE TRAY) ×3 IMPLANT
SHEATH ANL1 5FRX70 (SHEATH) ×3 IMPLANT
SHEATH ANL2 6FRX45 HC (SHEATH) ×3 IMPLANT
SHEATH BRITE TIP 5FRX11 (SHEATH) ×3 IMPLANT
SHEATH BRITE TIP 6FR X 23 (SHEATH) ×3 IMPLANT
TUBING CONTRAST HIGH PRESS 72 (TUBING) ×3 IMPLANT
WIRE G V18X300CM (WIRE) ×3 IMPLANT
WIRE J 3MM .035X145CM (WIRE) ×3 IMPLANT

## 2017-10-27 NOTE — H&P (Signed)
[facility] History & Physical Update  The patient was interviewed and re-examined.  The patient's previous History and Physical has been reviewed and is unchanged.  There is no change in the plan of care. We plan to proceed with the scheduled procedure.  Leotis Pain, MD  10/27/2017, 8:11 AM

## 2017-10-27 NOTE — Progress Notes (Signed)
Dr. Lucky Cowboy at bedside speaking with pt. And his daughter [name] re: procedural results. Both verbalilze understanding.

## 2017-10-27 NOTE — Op Note (Signed)
[facility]  Percutaneous Study/Intervention Procedural Note   Date of Surgery: 10/27/2017  Surgeon(s):Johncarlo Maalouf    Assistants:none  Pre-operative Diagnosis: PAD with ulceration bilateral lower extremities  Post-operative diagnosis:  Same  Procedure(s) Performed:             1.  Ultrasound guidance for vascular access left femoral artery             2.  Catheter placement into right anterior tibial artery from left femoral approach             3.  Aortogram and selective right lower extremity angiogram             4.  Percutaneous transluminal angioplasty of right anterior tibial artery proximally with 3 mm diameter angioplasty balloon             5.  StarClose closure device left femoral artery  EBL: 10  Contrast: 60 cc  Fluoro Time: 21 minutes  Moderate Conscious Sedation Time: approximately 60 minutes using 2 mg of Versed and 50 mcg of Fentanyl              Indications:  Patient is a 84 y.o.male with nonhealing ulcerations on both feet.  He has already undergone lower extremity revascularization in the past on both legs including an intervention on the left leg last week. The patient is brought in for angiography for further evaluation and potential treatment.  Due to the limb threatening nature of the situation, angiogram was performed for attempted limb salvage. The patient is aware that if the procedure fails, amputation would be expected.  The patient also understands that even with successful revascularization, amputation may still be required due to the severity of the situation. Risks and benefits are discussed and informed consent is obtained.   Procedure:  The patient was identified and appropriate procedural time out was performed.  The patient was then placed supine on the table and prepped and draped in the usual sterile fashion. Moderate conscious sedation was administered during a face to face encounter with the patient throughout the procedure  with my supervision of the RN administering medicines and monitoring the patient's vital signs, pulse oximetry, telemetry and mental status throughout from the start of the procedure until the patient was taken to the recovery room. Ultrasound was used to evaluate the left common femoral artery.  It was patent but heavily calcified.  A digital ultrasound image was acquired.  A Seldinger needle was used to access the left common femoral artery under direct ultrasound guidance and a permanent image was performed.  A 0.035 J wire was advanced without resistance and a 5Fr sheath was placed.  Pigtail catheter was placed into the aorta and an AP aortogram was performed.  Aortogram was performed due to the iliac stents and small aortic aneurysm and known difficulty crossing the bifurcation.  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  The previously placed iliac stents were patent without significant recurrent stenosis. I then crossed the aortic bifurcation and advanced to the right femoral head and into the proximal portion of the bypass graft to opacify distally. Selective right lower extremity angiogram was then performed. This demonstrated calcified femoral arteries, occluded SFA but the fem-pop bypass was widely patent.  There was then significant tibial disease.  The peroneal artery was patent without focal stenosis.  The posterior tibial artery was chronically occluded  with no visualization.  The anterior tibial artery had a short segment occlusion proximally and then occluded distally just above the ankle without contributing much flow into the foot.  He had fairly significant microvascular disease not amenable to endovascular therapy. The patient was systemically heparinized and a 6 French sheath was then placed over the [employer] wire.  Attempts to get a long sheath up and over the aortic bifurcation with his iliac  stents were largely unsuccessful, and we had to settle for a 23 cm sheath that went to the aortic bifurcation only.  This made visualization and opacification during the procedure much more difficult as well as providing less support.  I then used a Kumpe catheter and the advantage wire to navigate through the bypass and down into the tibial vessels.  I then proceeded with attempts at intervention concomitant to the angiography above to save the patient further access and contrast.  I got down into the anterior tibial artery and was able to cross the proximal occlusion and parked the wire just above the ankle where the vessel seemed to terminate.  A 3 mm diameter angioplasty balloon was used to treat the proximal anterior tibial artery and was inflated to 16 atm for 1 minute with improved flow proximally and less than 30% residual stenosis but continued occlusion distally.  The peroneal artery did not require any intervention.  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 I elected to terminate the procedure. The sheath was removed and StarClose closure device was deployed in the left femoral artery with excellent hemostatic result. The patient was taken to the recovery room in stable condition having tolerated the procedure well.  Findings:               Aortogram:  This demonstrated normal renal artery flow including a stent in the left renal artery and small aneurysm of the aorta which was known.  The previously placed iliac stents were patent without significant recurrent stenosis             Right lower Extremity:  Calcified femoral arteries, occluded SFA but the fem-pop bypass was widely patent.  There was then significant tibial disease.  The peroneal artery was patent without focal stenosis.  The posterior tibial artery was chronically occluded with no visualization.  The  anterior tibial artery had a short segment occlusion proximally and then occluded distally just above the ankle without contributing much flow into the foot.  He had fairly significant microvascular disease not amenable to endovascular therapy.   Disposition: Patient was taken to the recovery room in stable condition having tolerated the procedure well.  Complications: None  Leotis Pain 10/27/2017 9:44 AM   This note was created with Dragon Medical transcription system. Any errors in dictation are purely unintentional.

## 2017-10-28 ENCOUNTER — Inpatient Hospital Stay: Admit: 2017-10-28 | Payer: Medicare Other

## 2017-10-28 ENCOUNTER — Ambulatory Visit: Payer: Medicare Other

## 2017-10-28 DIAGNOSIS — I70203 Unspecified atherosclerosis of native arteries of extremities, bilateral legs: Secondary | ICD-10-CM | POA: Diagnosis not present

## 2017-10-28 DIAGNOSIS — Z794 Long term (current) use of insulin: Secondary | ICD-10-CM | POA: Diagnosis not present

## 2017-10-28 DIAGNOSIS — L97513 Non-pressure chronic ulcer of other part of right foot with necrosis of muscle: Secondary | ICD-10-CM | POA: Diagnosis not present

## 2017-10-28 DIAGNOSIS — E11621 Type 2 diabetes mellitus with foot ulcer: Secondary | ICD-10-CM | POA: Diagnosis not present

## 2017-10-28 DIAGNOSIS — I10 Essential (primary) hypertension: Secondary | ICD-10-CM | POA: Diagnosis not present

## 2017-10-28 DIAGNOSIS — M79604 Pain in right leg: Secondary | ICD-10-CM | POA: Diagnosis not present

## 2017-10-28 MED ORDER — CEFAZOLIN SODIUM-DEXTROSE 2-4 GM/100ML-% IV SOLN
2.0000 g | INTRAVENOUS | Status: AC
  Administered 2017-10-29: 2 g via INTRAVENOUS

## 2017-10-28 NOTE — Progress Notes (Signed)
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 therefore electrically terminated. At this point unfortunately the patient's toe on the left foot, third toe, actually appears to be doing significantly worse. It's cool to touch, cyanotic, and does seem to be progressing in a very poor direction. I think this is going to require amputation. I actually ended up having a conversation with the patient as well as his daughter who was present during evaluation today. Also subsequently ended up talking to Dr. dew concerning the toe and what we're seeing. Dr. dew feels that the patient could still continue with the angiogram of the right lower extremity this coming Monday and they are going to see about potentially getting him set up for amputation of the third toe possibly even this coming Wednesday. He's gonna check with the scheduler. Nonetheless that  we detailed in greater detail in the plan. Patient History Information obtained from Patient. Social History Former smoker, Marital Status - Widowed, Alcohol Use - Rarely, Drug Use - No History, Caffeine Use - Never. Medical And Surgical History Notes Cardiovascular pacemaker, 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 leg, stent left leg Review of Systems (ROS) Constitutional Symptoms (General Health) Denies complaints or symptoms of Fever, Chills. Respiratory The patient has no complaints or symptoms. Cardiovascular The patient has no complaints or symptoms. Psychiatric The patient has no complaints or symptoms. Objective Constitutional [name], [name] ([id number]) Well-nourished and well-hydrated in no acute distress. Vitals Time Taken: 10:25 AM, Height: 68 in, Weight: 180 lbs, BMI: 27.4, Temperature: 98.1 F, Pulse: 76 bpm, Respiratory Rate: 18 breaths/min, Blood Pressure: 130/45 mmHg. Respiratory normal breathing without difficulty. clear to auscultation bilaterally. Cardiovascular regular rate and rhythm with normal S1, S2. Patient's left third toe actually appears to be cyanotic and seems to be progressing as far as necrosis is concerned. I think this is going to require  amputation and this was discussed with Dr. dew today as well.Marland Kitchen Psychiatric this patient is able to make decisions and demonstrates good insight into disease process. Alert and Oriented x 3. pleasant and cooperative. General Notes: In general the patient's toe on the right foot actually seems to be doing fairly well no debridement was performed at this time as he still has not undergone the injured [place] for the right lower extremity that's actually scheduled for Monday 10/27/17 Integumentary (Hair, Skin) Wound #1 status is Open. Original cause of wound was Gradually Appeared. The wound is located on the Right Toe Second. The wound measures 0.6cm length x 0.7cm width x 0.1cm depth; 0.33cm^2 area and 0.033cm^3 volume.  There is Fat Layer (Subcutaneous Tissue) Exposed exposed. There is a small amount of serous drainage noted. The wound margin is distinct with the outline attached to the wound base. There is no granulation within the wound bed. There is a large (67-100%) amount of necrotic tissue within the wound bed including Eschar and Adherent Slough. The periwound skin appearance exhibited: Maceration. The periwound skin appearance did not exhibit: Callus, Crepitus, Excoriation, Induration, Rash, Scarring, Dry/Scaly, Atrophie Blanche, Cyanosis, Ecchymosis, Hemosiderin Staining, Mottled, Pallor, Rubor, Erythema. Periwound temperature was noted as No Abnormality. Wound #2 status is Open. Original cause of wound was Gradually Appeared. The wound is located on the Left Toe Third. The wound measures 2cm length x 1.6cm width x 0.2cm depth; 2.513cm^2 area and 0.503cm^3 volume. There is Fat Layer (Subcutaneous Tissue) Exposed exposed. There is no tunneling or undermining noted. There is a small amount of purulent drainage noted. The wound margin is indistinct and nonvisible. There is no granulation within the wound bed. There is a large (67-100%) amount of necrotic tissue within the wound bed including Eschar and Adherent Slough. The periwound skin appearance did not exhibit: Callus, Crepitus, Excoriation, Induration, Rash, Scarring, Dry/Scaly, Maceration, Atrophie Blanche, Cyanosis, Ecchymosis, Hemosiderin Staining, Mottled, Pallor, Rubor, Erythema. Periwound temperature was noted as No Abnormality. 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 ([id number]) Plan Wound Cleansing: Wound #1 Right Toe Second: Clean wound with Normal Saline. May Shower, gently pat wound dry prior to applying new dressing. Wound #2 Left Toe Third: Clean wound with Normal Saline. May Shower, gently pat wound dry prior to applying new dressing. Anesthetic (add to Medication List): Wound #1 Right Toe Second: Topical Lidocaine 4% cream applied to wound bed prior to debridement (In Clinic Only). Wound #2 Left Toe Third: Topical Lidocaine 4% cream applied to wound bed prior to debridement (In Clinic Only). Primary Wound Dressing: Wound #1 Right Toe Second: Santyl Ointment Wound #2 Left Toe Third: Silver Alginate Secondary Dressing: Wound #1 Right Toe Second: Gauze and Kerlix/Conform Wound #2 Left Toe Third: Gauze and Kerlix/Conform Dressing Change Frequency: Wound #1 Right Toe Second: Change dressing every day. - HHRN to change dressing three times weekly and [facility] will change dressing once and family will attempt to change dressing the other days Wound #2 Left Toe Third: Change dressing every day. - HHRN to change dressing three times weekly and [facility] will change dressing once and family will attempt  to change dressing the other days Follow-up Appointments: Wound #1 Right Toe Second: Return Appointment in 1 week. Wound #2 Left Toe Third: Return Appointment in 1 week. Home Health: Wound #1 Right Toe Second: [facility] Nurse may visit PRN to address patient s wound care needs. FACE TO FACE ENCOUNTER: MEDICARE and MEDICAID PATIENTS: I certify that this patient is under my care and that I had a face-to-face encounter that meets the physician face-to-face encounter requirements with this patient on this date. The encounter with the patient was in whole or in part for the following MEDICAL  CONDITION: (primary reason for [facility]) MEDICAL NECESSITY: I certify, that based on my findings, NURSING services are a medically necessary home health service. HOME BOUND STATUS: I certify that my clinical findings support that this patient is homebound (i.e., Due to illness or injury, pt requires aid of supportive devices such as crutches, cane, wheelchairs, walkers, the use of special transportation or the assistance of another person to leave their place of residence. There is a normal inability to leave the home and doing so requires considerable and taxing effort. Other absences are for medical reasons / religious services and are infrequent or of short duration when for other reasons). If current dressing causes regression in wound condition, may D/C ordered dressing product/s and apply Normal Saline Moist Dressing daily until next [place] / Other MD appointment. [place] of regression in wound condition at [phone]. Please direct any NON-WOUND related issues/requests for orders to patient's Primary Care Physician [name], [name] TRETHEWEY ([id number]) Wound #2 Left Toe Third: [facility] Nurse may visit PRN to address patient s wound care needs. FACE TO FACE ENCOUNTER: MEDICARE and MEDICAID PATIENTS: I certify that this patient is under my care and that I had a face-to-face encounter that meets the physician face-to-face encounter requirements with this patient on this date. The encounter with the patient was in whole or in part for the following MEDICAL CONDITION: (primary reason for [place]) MEDICAL NECESSITY: I certify, that based on my findings, NURSING services are a medically necessary home health service. HOME BOUND STATUS: I certify that my clinical findings support that this patient is homebound (i.e., Due to illness or injury, pt requires aid of supportive devices such as crutches, cane,  wheelchairs, walkers, the use of special transportation or the assistance of another person to leave their place of residence. There is a normal inability to leave the home and doing so requires considerable and taxing effort. Other absences are for medical reasons / religious services and are infrequent or of short duration when for other reasons). If current dressing causes regression in wound condition, may D/C ordered dressing product/s and apply Normal Saline Moist Dressing daily until next [place] / Other MD appointment. [place] of regression in wound condition at [phone]. Please direct any NON-WOUND related issues/requests for orders to patient's Primary Care Physician Medications-please add to medication list.: Wound #1 Right Toe Second: Santyl Enzymatic Ointment At this point my suggestion is going to be that we actually initiate treatment with the above wound care measures for the next week. With that being said again I did have an extensive conversation with Dr. dew today and he is actually gonna get in touch with the scheduler to see about getting the patient set up for an invitation of the left third toe this coming week. He states  this can likely be done outpatient and all this was discussed and related to the patient and his daughter during the evaluation today. Subsequently we will continue to see the patient for follow-up and management of his right foot ulcers of his he left foot especially post-amputation was falling in the realm of Dr. dew managing that during the 90 day postop. Nonetheless I think we did do a very good job today of coordinating care between Dr. Bunnie Domino office and hours there gonna get in touch with the patient's family as far as getting things set up with the surgery next week. He will still go for the injured [place] on Monday and will subsequently see him back next Friday. If anything changes in the meantime they will  contact our office for additional recommendations. Please see above for specific wound care orders. We will see patient for re-evaluation in 1 week(s) here in the clinic. If anything worsens or changes patient will contact our office for additional recommendations. Electronic Signature(s) Signed: 10/27/2017 8:08:51 AM By: Worthy Keeler PA-C Entered By: Worthy Keeler on 10/24/2017 15:49:52 Mcclaren, [name] ([id number]) -------------------------------------------------------------------------------- ROS/PFSH Details Patient Name: Orrison, Brondon S. Date of Service: 10/24/2017 10:15 AM Medical Record Number: [id number] Patient Account Number: [id number] Date of Birth/Sex: 08-15-1924 (84 y.o. M) Treating RN: Montey Hora Primary Care Provider: Deborra Medina Other Clinician: Referring Provider: Deborra Medina Treating Provider/Extender: Melburn Hake, HOYT Weeks in Treatment: 2 Information Obtained From Patient Wound History Constitutional Symptoms (General Health) Complaints and Symptoms: Negative for: Fever; Chills Eyes Medical History: Positive for: Cataracts Negative for: Glaucoma; Optic Neuritis Ear/Nose/Mouth/Throat Medical History: Negative for: Chronic sinus problems/congestion; Middle ear problems Hematologic/Lymphatic Medical History: Positive for: Lymphedema Negative for: Anemia; Hemophilia; Human Immunodeficiency Virus; Sickle Cell Disease Respiratory Complaints and Symptoms: No Complaints or Symptoms Medical History: Negative for: Aspiration; Asthma; Chronic Obstructive Pulmonary Disease (COPD); Pneumothorax; Sleep Apnea; Tuberculosis Cardiovascular Complaints and Symptoms: No Complaints or Symptoms Medical History: Positive for: Coronary Artery Disease; Hypertension; Peripheral Venous Disease Negative for: Angina; Arrhythmia; Congestive Heart Failure; Deep Vein Thrombosis; Hypotension; Myocardial Infarction; Peripheral Arterial Disease; Phlebitis;  Vasculitis Past Medical History Notes: pacemaker, 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 leg, stent left leg Gastrointestinal Gaertner, Nayden S. ([id number]) Medical History: Negative for: Cirrhosis ; Colitis; Crohnos; Hepatitis A; Hepatitis B; Hepatitis C Endocrine Medical History: Positive for: Type II Diabetes - since 1988 Negative for: Type I Diabetes Treated with: Insulin Blood sugar tested every day: Yes Tested : 3 times daily Blood sugar testing results: Breakfast: 114 Genitourinary Medical History: Positive for: End Stage Renal Disease Immunological Medical History: Negative for: Lupus Erythematosus; Raynaudos; Scleroderma Integumentary (Skin) Medical History: Negative for: History of Burn; History of pressure wounds Musculoskeletal Medical History: Positive for: Rheumatoid Arthritis Negative for: Gout; Osteoarthritis; Osteomyelitis Neurologic Medical History: Positive for: Neuropathy - feet and legs Negative for: Dementia; Quadriplegia; Paraplegia; Seizure Disorder Psychiatric Complaints and Symptoms: No Complaints or Symptoms Medical History: Negative for: Anorexia/bulimia; Confinement Anxiety HBO Extended History Items Eyes: Cataracts Immunizations Pneumococcal Vaccine: Received Pneumococcal Vaccination: No Implantable Devices [name], [name] BARKAN ([id number]) Family and Social History Former smoker; Marital Status - Widowed; Alcohol Use: Rarely; Drug Use: No History; Caffeine Use: Never; Financial Concerns: No; Food, Clothing or Shelter Needs: No; Support System Lacking: No; Transportation Concerns: No; Advanced Directives: Yes (Not Provided); Patient does not want information on Advanced Directives; Do not resuscitate: No; Living Will: Yes (Not Provided); Medical Power of Attorney: Yes (Not Provided) Physician Affirmation I have reviewed and agree with the above  information. Electronic Signature(s) Signed: 10/24/2017 5:18:43 PM By: Montey Hora Signed: 10/27/2017  8:08:51 AM By: Worthy Keeler PA-C Entered By: Worthy Keeler on 10/24/2017 15:46:56 Ficken, [name] ([id number]) -------------------------------------------------------------------------------- [place] Details Patient Name: Ferrentino, Kayvan S. Date of Service: 10/24/2017 Medical Record Number: [id number] Patient Account Number: [id number] Date of Birth/Sex: 16-Apr-1924 (84 y.o. M) Treating RN: Montey Hora Primary Care Provider: Deborra Medina Other Clinician: Referring Provider: Deborra Medina Treating Provider/Extender: Melburn Hake, HOYT Weeks in Treatment: 2 Diagnosis Coding ICD-10 Codes Code Description [phone] Type 2 diabetes mellitus with foot ulcer E11.40 Type 2 diabetes mellitus with diabetic neuropathy, unspecified L97.512 Non-pressure chronic ulcer of other part of right foot with fat layer exposed 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 N18.9 Chronic kidney disease, unspecified I73.9 Peripheral vascular disease, unspecified Y19.50 Chronic systolic (congestive) heart failure I10 Essential (primary) hypertension Facility Procedures CPT4 Code: [id number] Description: 99213 - WOUND CARE VISIT-LEV 3 EST PT Modifier: Quantity: 1 Physician Procedures CPT4: Description Modifier Quantity Code [id number] - WC PHYS LEVEL 4 - EST PT 1 ICD-10 Diagnosis Description E11.621 Type 2 diabetes mellitus with foot ulcer E11.40 Type 2 diabetes mellitus with diabetic neuropathy, unspecified L97.512 Non-pressure  chronic ulcer of other part of right foot with fat layer exposed 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 Electronic Signature(s) Signed: 10/27/2017 8:08:51 AM By: Worthy Keeler PA-C Entered By: Worthy Keeler on 10/24/2017 15:50:08

## 2017-10-28 NOTE — Progress Notes (Signed)
ILLYA, GIENGER ([id number]) Visit Report for 10/24/2017 Arrival Information Details Patient Name: [name], [name]. Date of Service: 10/24/2017 10:15 AM Medical Record Number: [id number] Patient Account Number: [id number] Date of Birth/Sex: 1924/08/15 (84 y.o. M) Treating RN: Montey Hora Primary Care Alieu Finnigan: Deborra Medina Other Clinician: Referring Demetra Moya: Deborra Medina Treating Jamont Mellin/Extender: Melburn Hake, HOYT Weeks in Treatment: 2 Visit Information History Since Last Visit All ordered tests and consults were completed: Yes Patient Arrived: Wheel Chair Added or deleted any medications: No Arrival Time: 10:20 Any new allergies or adverse reactions: No Accompanied By: daughter Had a fall or experienced change in No Transfer Assistance: Manual activities of daily living that may affect Patient Identification Verified: Yes risk of falls: Secondary Verification Process Completed: Yes Signs or symptoms of abuse/neglect since last visito No Hospitalized since last visit: No Implantable device outside of the clinic excluding No cellular tissue based products placed in the center since last visit: Has Dressing in Place as Prescribed: Yes Pain Present Now: Yes Electronic Signature(s) Signed: 10/24/2017 11:50:51 AM By: Lorine Bears RCP, RRT, CHT Entered By: Becky Sax, Amado Nash on 10/24/2017 10:23:53 Aslinger, [name] ([id number]) -------------------------------------------------------------------------------- Clinic Level of Care Assessment Details Patient Name: [name], [name] Date of Service: 10/24/2017 10:15 AM Medical Record Number: [id number] Patient Account Number: [id number] Date of Birth/Sex: 1925-01-31 (84 y.o. M) Treating RN: Montey Hora Primary Care Tnia Anglada: Deborra Medina Other Clinician: Referring Shelia Kingsberry: Deborra Medina Treating Jaicob Dia/Extender: Melburn Hake, HOYT Weeks in Treatment: 2 Clinic Level of Care Assessment Items TOOL 4  Quantity Score []  - Use when only an EandM is performed on FOLLOW-UP visit 0 ASSESSMENTS - Nursing Assessment / Reassessment X - Reassessment of Co-morbidities (includes updates in patient status) 1 10 X- 1 5 Reassessment of Adherence to Treatment Plan ASSESSMENTS - Wound and Skin Assessment / Reassessment []  - Simple Wound Assessment / Reassessment - one wound 0 X- 2 5 Complex Wound Assessment / Reassessment - multiple wounds []  - 0 Dermatologic / Skin Assessment (not related to wound area) ASSESSMENTS - Focused Assessment X - Circumferential Edema Measurements - multi extremities 1 5 []  - 0 Nutritional Assessment / Counseling / Intervention X- 1 5 Lower Extremity Assessment (monofilament, tuning fork, pulses) []  - 0 Peripheral Arterial Disease Assessment (using hand held doppler) ASSESSMENTS - Ostomy and/or Continence Assessment and Care []  - Incontinence Assessment and Management 0 []  - 0 Ostomy Care Assessment and Management (repouching, etc.) PROCESS - Coordination of Care X - Simple Patient / Family Education for ongoing care 1 15 []  - 0 Complex (extensive) Patient / Family Education for ongoing care []  - 0 Staff obtains Programmer, systems, Records, Test Results / Process Orders []  - 0 Staff telephones HHA, Nursing Homes / Clarify orders / etc []  - 0 Routine Transfer to another Facility (non-emergent condition) []  - 0 Routine Hospital Admission (non-emergent condition) []  - 0 New Admissions / Biomedical engineer / Ordering NPWT, Apligraf, etc. []  - 0 Emergency Hospital Admission (emergent condition) X- 1 10 Simple Discharge Coordination Blasius, Jered S. ([id number]) []  - 0 Complex (extensive) Discharge Coordination PROCESS - Special Needs []  - Pediatric / Minor Patient Management 0 []  - 0 Isolation Patient Management []  - 0 Hearing / Language / Visual special needs []  - 0 Assessment of Community assistance (transportation, D/C planning, etc.) []  - 0 Additional  assistance / Altered mentation []  - 0 Support Surface(s) Assessment (bed, cushion, seat, etc.) INTERVENTIONS - Wound Cleansing / Measurement []  - Simple Wound Cleansing - one wound 0 X-  2 5 Complex Wound Cleansing - multiple wounds X- 1 5 Wound Imaging (photographs - any number of wounds) []  - 0 Wound Tracing (instead of photographs) []  - 0 Simple Wound Measurement - one wound X- 2 5 Complex Wound Measurement - multiple wounds INTERVENTIONS - Wound Dressings X - Small Wound Dressing one or multiple wounds 2 10 []  - 0 Medium Wound Dressing one or multiple wounds []  - 0 Large Wound Dressing one or multiple wounds []  - 0 Application of Medications - topical []  - 0 Application of Medications - injection INTERVENTIONS - Miscellaneous []  - External ear exam 0 []  - 0 Specimen Collection (cultures, biopsies, blood, body fluids, etc.) []  - 0 Specimen(s) / Culture(s) sent or taken to Lab for analysis []  - 0 Patient Transfer (multiple staff / Civil Service fast streamer / Similar devices) []  - 0 Simple Staple / Suture removal (25 or less) []  - 0 Complex Staple / Suture removal (26 or more) []  - 0 Hypo / Hyperglycemic Management (close monitor of Blood Glucose) []  - 0 Ankle / Brachial Index (ABI) - do not check if billed separately X- 1 5 Vital Signs [name], [name] ([id number]) Has the patient been seen at the hospital within the last three years: Yes Total Score: 110 Level Of Care: New/Established - Level 3 Electronic Signature(s) Signed: 10/24/2017 5:18:43 PM By: Montey Hora Entered By: Montey Hora on 10/24/2017 11:43:28 [name], [name] ([id number]) -------------------------------------------------------------------------------- Encounter Discharge Information Details Patient Name: [name], [name] Date of Service: 10/24/2017 10:15 AM Medical Record Number: [id number] Patient Account Number: [id number] Date of Birth/Sex: 12-15-1924 (84 y.o. M) Treating RN: Cornell Barman Primary  Care [name] Scheier: Deborra Medina Other Clinician: Referring Jalin Alicea: Deborra Medina Treating Raylin Winer/Extender: Melburn Hake, HOYT Weeks in Treatment: 2 Encounter Discharge Information Items Discharge Condition: Stable Ambulatory Status: Ambulatory Discharge Destination: Home Transportation: Private Auto Accompanied By: daughter Schedule Follow-up Appointment: Yes Clinical Summary of Care: Electronic Signature(s) Signed: 10/24/2017 5:43:30 PM By: Gretta Cool, BSN, RN, CWS, Kim RN, BSN Entered By: Gretta Cool, BSN, RN, CWS, Kim on 10/24/2017 11:42:36 Kimbler, [name] ([id number]) -------------------------------------------------------------------------------- Lower Extremity Assessment Details Patient Name: Ream, Parth S. Date of Service: 10/24/2017 10:15 AM Medical Record Number: [id number] Patient Account Number: [id number] Date of Birth/Sex: Sep 07, 1924 (84 y.o. M) Treating RN: Secundino Ginger Primary Care Tracie Dore: Deborra Medina Other Clinician: Referring Lerin Jech: Deborra Medina Treating Kerrington Greenhalgh/Extender: Melburn Hake, HOYT Weeks in Treatment: 2 Edema Assessment Assessed: [Left: No] [Right: No] [Left: Edema] [Right: :] Calf Left: Right: Point of Measurement: 34 cm From Medial Instep 33.5 cm 35.5 cm Ankle Left: Right: Point of Measurement: 10 cm From Medial Instep 26.5 cm 27.5 cm Vascular Assessment Claudication: Claudication Assessment [Left:None] [Right:None] Pulses: Dorsalis Pedis Palpable: [Left:Yes] [Right:Yes] Posterior Tibial Extremity colors, hair growth, and conditions: Extremity Color: [Left:Normal] [Right:Normal] Hair Growth on Extremity: [Left:No] [Right:No] Temperature of Extremity: [Left:Warm] [Right:Warm] Capillary Refill: [Left:< 3 seconds] [Right:< 3 seconds] Notes missing some toe nails. Electronic Signature(s) Signed: 10/24/2017 4:01:10 PM By: Secundino Ginger Entered By: Secundino Ginger on 10/24/2017 10:43:17 [name], [name]  ([id number]) -------------------------------------------------------------------------------- Multi Wound Chart Details Patient Name: Mays, Rahmir S. Date of Service: 10/24/2017 10:15 AM Medical Record Number: [id number] Patient Account Number: [id number] Date of Birth/Sex: [birth year] (84 y.o. 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 N/A Comorbid History: Cataracts, Lymphedema, Cataracts, Lymphedema, N/A Coronary Artery Disease, Coronary Artery Disease, Hypertension, Peripheral Hypertension, Peripheral Venous Disease, Type II Venous Disease, Type II Diabetes, End Stage Renal Diabetes, End Stage Renal Disease, Rheumatoid Arthritis, Disease, Rheumatoid Arthritis, Neuropathy Neuropathy Date Acquired: 06/04/2017 06/04/2017 N/A Weeks of Treatment: 2 2 N/A Wound Status: Open Open N/A Pending Amputation on Yes Yes N/A Presentation: Measurements L x W x D 0.6x0.7x0.1 2x1.6x0.2 N/A (cm) Area (cm) : 0.33 2.513 N/A Volume (cm) : 0.033 0.503 N/A % Reduction in Area: 72.00% 41.80% N/A % Reduction in Volume: 86.00% 41.80% N/A Classification: Grade 2 Grade 2 N/A Exudate Amount: Small Small N/A Exudate Type: Serous Purulent N/A Exudate Color: amber yellow, brown, green N/A Wound Margin: Distinct, outline attached Indistinct, nonvisible N/A Granulation Amount: None  Present (0%) None Present (0%) N/A [name], [name] ([id number]) Necrotic Amount: Large (67-100%) Large (67-100%) N/A Necrotic Tissue: Eschar, Adherent Slough Eschar, Adherent Slough N/A Exposed Structures: Fat Layer (Subcutaneous Fat Layer (Subcutaneous N/A Tissue) Exposed: Yes Tissue) Exposed: Yes Fascia: No Fascia: No Tendon: No Tendon: No Muscle: No Muscle: No Joint: No Joint: No Bone: No Bone: No Epithelialization: None None N/A Periwound Skin Texture: Excoriation: No Excoriation: No N/A Induration: No Induration: No Callus: No Callus: No Crepitus: No Crepitus: No Rash: No Rash: No Scarring: No Scarring: No Periwound Skin Moisture: Maceration: Yes Maceration: No N/A Dry/Scaly: No Dry/Scaly: No Periwound Skin Color: Atrophie Blanche: No Atrophie Blanche: No N/A Cyanosis: No Cyanosis: No Ecchymosis: No Ecchymosis: No Erythema: No Erythema: No Hemosiderin Staining: No Hemosiderin Staining: No Mottled: No Mottled: No Pallor: No Pallor: No Rubor: No Rubor: No Temperature: No Abnormality No Abnormality N/A Tenderness on Palpation: No No N/A Wound Preparation: Ulcer Cleansing: Ulcer Cleansing: N/A Rinsed/Irrigated with Saline Rinsed/Irrigated with Saline Topical Anesthetic Applied: Topical Anesthetic Applied: Other: lidocaince 4% Other: lidocaince 4% Treatment Notes Electronic Signature(s) Signed: 10/24/2017 5:18:43 PM By: Montey Hora Entered By: Montey Hora on 10/24/2017 10:54:23 Bevan, [name] ([id number]) -------------------------------------------------------------------------------- [name] Details Patient Name: Morawski, [name] Date of Service: 10/24/2017 10:15 AM Medical Record Number: [id number] Patient Account Number: [id number] Date of Birth/Sex: March 18, 1924 (84 y.o. M) Treating RN: Montey Hora Primary Care Jaslyne Beeck: Deborra Medina Other Clinician: Referring Jeralyn Nolden: Deborra Medina Treating  Cong Hightower/Extender: Melburn Hake, HOYT Weeks in Treatment: 2 Active Inactive ` Abuse / Safety / Falls / Self Care Management Nursing Diagnoses: Potential for falls Goals: Patient will remain injury free related to falls Date Initiated: 10/10/2017 Target Resolution Date: 01/02/2018 Goal Status: Active Interventions: Assess fall risk on admission and as needed Notes: ` Orientation to the Wound Care Program Nursing Diagnoses: Knowledge deficit related to the wound healing center program Goals: Patient/caregiver will verbalize understanding of the [place] Date Initiated: 10/10/2017 Target Resolution Date: 01/02/2018 Goal Status: Active Interventions: Provide education on orientation to the wound center Notes: ` Pain, Acute or Chronic Nursing Diagnoses: Potential alteration in comfort, pain Goals: Patient/caregiver will verbalize adequate pain control between visits Date Initiated: 10/10/2017 Target Resolution Date: 01/02/2018 Goal Status: Active Interventions: [name], [name] LEDVINA. ([id number]) Complete pain assessment as per visit requirements Notes: ` Wound/Skin Impairment Nursing Diagnoses: Impaired tissue integrity Goals: Ulcer/skin breakdown will heal within 14 weeks Date Initiated: 10/10/2017 Target Resolution Date: 01/02/2018 Goal Status: Active Interventions:  Assess patient/caregiver ability to obtain necessary supplies Assess patient/caregiver ability to perform ulcer/skin care regimen upon admission and as needed Assess ulceration(s) every visit Notes: Electronic Signature(s) Signed: 10/24/2017 5:18:43 PM By: Montey Hora Entered By: Montey Hora on 10/24/2017 10:54:13 [name], [name] ([id number]) -------------------------------------------------------------------------------- Pain Assessment Details Patient Name: Belgarde, Berle S. Date of Service: 10/24/2017 10:15 AM Medical Record Number: [id number] Patient Account Number:  [id number] Date of Birth/Sex: 1924-11-30 (84 y.o. M) Treating RN: Montey Hora Primary Care Kagen Kunath: Deborra Medina Other Clinician: Referring Shakyla Nolley: Deborra Medina Treating Ishitha Roper/Extender: Melburn Hake, HOYT Weeks in Treatment: 2 Active Problems Location of Pain Severity and Description of Pain Patient Has Paino Yes Site Locations Duration of the Pain. Constant / Intermittento Intermittent Rate the pain. Current Pain Level: 10 Character of Pain Describe the Pain: Sharp, Stabbing Pain Management and Medication Current Pain Management: Electronic Signature(s) Signed: 10/24/2017 11:50:51 AM By: Lorine Bears RCP, RRT, CHT Signed: 10/24/2017 5:18:43 PM By: Montey Hora Entered By: Lorine Bears on 10/24/2017 10:24:25 [name], [name] ([id number]) -------------------------------------------------------------------------------- Patient/Caregiver Education Details Patient Name: [name], [name] S. Date of Service: 10/24/2017 10:15 AM Medical Record Number: [id number] Patient Account Number: [id number] Date of Birth/Gender: 1924/05/31 (84 y.o. M) Treating RN: Cornell Barman Primary Care Physician: Deborra Medina Other Clinician: Referring Physician: Deborra Medina Treating Physician/Extender: Melburn Hake, HOYT Weeks in Treatment: 2 Education Assessment Education Provided To: Patient Education Topics Provided Wound/Skin Impairment: Handouts: Caring for Your Ulcer, Other: no tight dressings Methods: Demonstration, Explain/Verbal Responses: State content correctly Electronic Signature(s) Signed: 10/24/2017 5:43:30 PM By: Gretta Cool, BSN, RN, CWS, Kim RN, BSN Entered By: Gretta Cool, BSN, RN, CWS, Kim on 10/24/2017 11:42:50 Staup, [name] ([id number]) -------------------------------------------------------------------------------- Wound Assessment Details Patient Name: Debruyne, Clester S. Date of Service: 10/24/2017 10:15 AM Medical Record Number: [id number] Patient  Account Number: [id number] Date of Birth/Sex: Nov 18, 1924 (84 y.o. M) Treating RN: Secundino Ginger Primary Care Devereaux Grayson: Deborra Medina Other Clinician: Referring Dimond Crotty: Deborra Medina Treating Sigismund Cross/Extender: Melburn Hake, HOYT Weeks in Treatment: 2 Wound Status Wound Number: 1 Primary Diabetic Wound/Ulcer of the Lower Extremity Etiology: Wound Location: Right Toe Second Secondary Arterial Insufficiency Ulcer Wounding Event: Gradually Appeared Etiology: Date Acquired: 06/04/2017 Wound Open Weeks Of Treatment: 2 Status: Clustered Wound: No Comorbid Cataracts, Lymphedema, Coronary Artery Pending Amputation On Presentation History: Disease, Hypertension, Peripheral Venous Disease, Type II Diabetes, End Stage Renal Disease, Rheumatoid Arthritis, Neuropathy Photos Photo Uploaded By: Secundino Ginger on 10/24/2017 10:47:53 Wound Measurements Length: (cm) 0.6 % Reduction Width: (cm) 0.7 % Reduction Depth: (cm) 0.1 Epitheliali Area: (cm) 0.33 Volume: (cm) 0.033 in Area: 72% in Volume: 86% zation: None Wound Description Classification: Grade 2 Foul Odor A Wound Margin: Distinct, outline attached Slough/Fibr Exudate Amount: Small Exudate Type: Serous Exudate Color: amber fter Cleansing: No ino Yes Wound Bed Granulation Amount: None Present (0%) Exposed Structure Necrotic Amount: Large (67-100%) Fascia Exposed: No Necrotic Quality: Eschar, Adherent Slough Fat Layer (Subcutaneous Tissue) Exposed: Yes Tendon Exposed: No Muscle Exposed: No Joint Exposed: No [place], Purvis S. ([id number]) Bone Exposed: No Periwound Skin Texture Texture Color No Abnormalities Noted: No No Abnormalities Noted: No Callus: No Atrophie Blanche: No Crepitus: No Cyanosis: No Excoriation: No Ecchymosis: No Induration: No Erythema: No Rash: No Hemosiderin Staining: No Scarring: No Mottled: No Pallor: No Moisture Rubor: No No Abnormalities Noted: No Dry / Scaly: No Temperature /  Pain Maceration: Yes Temperature: No Abnormality Wound Preparation Ulcer Cleansing: Rinsed/Irrigated with Saline Topical Anesthetic Applied: Other: lidocaince 4%, Treatment Notes Wound #1 (Right Toe Second) Notes silvercell  left; santyl right Electronic Signature(s) Signed: 10/24/2017 4:01:10 PM By: Secundino Ginger Entered By: Secundino Ginger on 10/24/2017 10:35:50 Remmert, [name] ([id number]) -------------------------------------------------------------------------------- Wound Assessment Details Patient Name: Formby, Ronie S. Date of Service: 10/24/2017 10:15 AM Medical Record Number: [id number] Patient Account Number: [id number] Date of Birth/Sex: [birth year] (84 y.o. M) Treating RN: Secundino Ginger Primary Care Kellene Mccleary: Deborra Medina Other Clinician: Referring [place] Sarinana: Deborra Medina Treating Elliett Guarisco/Extender: Melburn Hake, HOYT Weeks in Treatment: 2 Wound Status Wound Number: 2 Primary Diabetic Wound/Ulcer of the Lower Extremity Etiology: Wound Location: Left Toe Third Secondary Arterial Insufficiency Ulcer Wounding Event: Gradually Appeared Etiology: Date Acquired: 06/04/2017 Wound Open Weeks Of Treatment: 2 Status: Clustered Wound: No Comorbid Cataracts, Lymphedema, Coronary Artery Pending Amputation On Presentation History: Disease, Hypertension, Peripheral Venous Disease, Type II Diabetes, End Stage Renal Disease, Rheumatoid Arthritis, Neuropathy Photos Photo Uploaded By: Secundino Ginger on 10/24/2017 10:47:54 Wound Measurements Length: (cm) 2 Width: (cm) 1.6 Depth: (cm) 0.2 Area: (cm) 2.513 Volume: (cm) 0.503 % Reduction in Area: 41.8% % Reduction in Volume: 41.8% Epithelialization: None Tunneling: No Undermining: No Wound Description Classification: Grade 2 Foul Odor A Wound Margin: Indistinct, nonvisible Slough/Fibr Exudate Amount: Small Exudate Type: Purulent Exudate Color: yellow, brown, green fter Cleansing: No ino Yes Wound Bed Granulation Amount: None Present  (0%) Exposed Structure Necrotic Amount: Large (67-100%) Fascia Exposed: No Necrotic Quality: Eschar, Adherent Slough Fat Layer (Subcutaneous Tissue) Exposed: Yes Tendon Exposed: No Muscle Exposed: No Joint Exposed: No Peerson, Isabel S. ([id number]) Bone Exposed: No Periwound Skin Texture Texture Color No Abnormalities Noted: No No Abnormalities Noted: No Callus: No Atrophie Blanche: No Crepitus: No Cyanosis: No Excoriation: No Ecchymosis: No Induration: No Erythema: No Rash: No Hemosiderin Staining: No Scarring: No Mottled: No Pallor: No Moisture Rubor: No No Abnormalities Noted: No Dry / Scaly: No Temperature / Pain Maceration: No Temperature: No Abnormality Wound Preparation Ulcer Cleansing: Rinsed/Irrigated with Saline Topical Anesthetic Applied: Other: lidocaince 4%, Treatment Notes Wound #2 (Left Toe Third) Notes silvercell left; santyl right Electronic Signature(s) Signed: 10/24/2017 4:01:10 PM By: Secundino Ginger Entered By: Secundino Ginger on 10/24/2017 10:38:11 Fournier, [name] ([id number]) -------------------------------------------------------------------------------- [place] Details Patient Name: Kelleher, Jaylin S. Date of Service: 10/24/2017 10:15 AM Medical Record Number: [id number] Patient Account Number: [id number] Date of Birth/Sex: May 29, 1924 (84 y.o. M) Treating RN: Montey Hora Primary Care Yussuf Sawyers: Deborra Medina Other Clinician: Referring Gilmer Kaminsky: Deborra Medina Treating Mayelin Panos/Extender: Melburn Hake, HOYT Weeks in Treatment: 2 Vital Signs Time Taken: 10:25 Temperature (F): 98.1 Height (in): 68 Pulse (bpm): 76 Weight (lbs): 180 Respiratory Rate (breaths/min): 18 Body Mass Index (BMI): 27.4 Blood Pressure (mmHg): 130/45 Reference Range: 80 - 120 mg / dl Electronic Signature(s) Signed: 10/24/2017 11:50:51 AM By: Lorine Bears RCP, RRT, CHT Entered By: Becky Sax, Amado Nash on 10/24/2017 10:26:34

## 2017-10-28 NOTE — Telephone Encounter (Signed)
LMTCB with Magda Paganini. Please transfer Magda Paganini to our office.

## 2017-10-28 NOTE — Pre-Procedure Instructions (Signed)
Progress Notes - documented in this encounter  [name] Cowman, MD - 09/04/2017 2:30 PM EDT Formatting of this note might be different from the original. Established Patient Visit   Chief Complaint: Chief Complaint  Patient presents with  . Follow-up  6 weeks  . Edema  swelling in feet  Date of Service: 09/04/2017 Date of Birth: 1924/04/07 PCP: Jannifer Hick, MD  History of Present Illness: [name] is a 84 y.o.male patient who returns for  1. CABG x 4 2001 at [facility] 2. Single-chamber pacemaker for sick sinus 3. Essential hypertension 4. Hyperlipidemia 5. Type 2 diabetes 6. Chronic kidney disease 7. Renal artery stenosis 8. Aortic stenosis 9. 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 Right lower extremity femoral bypass 2011  Patient returns for follow-up, reports doing better from a cardiovascular perspective. He denies chest pain. He has chronic exertional dyspnea which is unchanged. He denies palpitations or heart racing. He has peripheral edema which is improved on current dose of furosemide. The patient is not active, and does not exercise regularly, limited by peripheral neuropathy. 2D echocardiogram 07/16/2017 revealed LVEF of 35%, with moderate aortic stenosis, with calculated aortic valve area 0.64 cm, peak velocity 3.25 m/s, mean gradient 24.9 mmHg, and peak gradient of 42.3 mmHg. Pacemaker interrogation 04/08/2017 revealed normally functioning single-chamber pacemaker in VVIR mode with a longevity of 30 months. Lexiscan sestamibi study 11/01/2012 revealed LV ejection fraction of 43% with inferior and apical scar without ischemia. Recent   The patient has essential hypertension, blood pressure well controlled, currently on valsartan, metoprolol tartrate, furosemide, and amlodipine, which are well tolerated without apparent side effects. The patient  follows a low-sodium, no added salt diet.  The patient has mixed hyperlipidemia, currently on atorvastatin and fenofibrate, which are well tolerated without apparent side effects, followed by his primary care provider. The patient follows a low-cholesterol, low-fat diet.  The patient has type 2 diabetes, currently on insulin, followed by his primary care provider. The patient follows a low carbohydrate, ADA diet.  Past Medical and Surgical History  Past Medical History Past Medical History:  Diagnosis Date  . Chronic kidney disease  . Coronary artery disease  . Diabetes mellitus type 2, uncomplicated (CMS-HCC)  . Hyperlipidemia  . Hypertension  . Intraparenchymal renal artery disease (CMS-HCC)  . Peripheral vascular disease (CMS-HCC)  . Spinal stenosis   Past Surgical History He has a past surgical history that includes Renal artery stenting; Lower extremity stenting; Coronary artery bypass graft; and Insert / replace / remove pacemaker.   Medications and Allergies  Current Medications  Current Outpatient Medications  Medication Sig Dispense Refill  . amLODIPine (NORVASC) 10 MG tablet Take 0.5 tablets (5 mg total) by mouth once daily  . aspirin 325 MG tablet Take 325 mg by mouth once daily.  Marland Kitchen atorvastatin (LIPITOR) 20 MG tablet Take 20 mg by mouth once daily.  . BD ULTRA-FINE SHORT PEN NEEDLE 31 gauge x 5/16" needle USE AS DIRECTED 3 TIMES A DAY 5  . calcium carbonate-vitamin D3 (CALTRATE 600+D) 600 mg(1,538m) -400 unit tablet Take 2 tablets by mouth once daily.  . clopidogrel (PLAVIX) 75 mg tablet Take 75 mg by mouth once daily.  . cyanocobalamin (VITAMIN B12) 1,000 mcg/mL injection Inject into the muscle monthly  . fenofibrate nanocrystallized (TRICOR) 48 MG tablet Take 48 mg by mouth once daily.  . FUROsemide (LASIX) 20 MG tablet Take  40 mg by mouth once daily  . gabapentin (NEURONTIN) 300 MG capsule Take 300 mg by mouth 3 (three) times daily 0  . HYDROcodone-acetaminophen  (NORCO) 10-325 mg tablet Take by mouth  . insulin ASPART PROTAMINE-ASPART (NOVOLOG MIX FLEXPEN 70/30) 100 unit/mL (70-30) pen injector Inject 40 Units subcutaneously 3 (three) times daily before meals  . ketoconazole (NIZORAL) 2 % shampoo APPLY TO AFFECTED AREA ON FACE& EARS (LATHER & LEAVE ON FOR 5 MIN,RINSE WELL-USE 2-3 TIMES PER WEEK 3  . latanoprost (XALATAN) 0.005 % ophthalmic solution 3  . losartan (COZAAR) 100 MG tablet Take 100 mg by mouth once daily  . metoprolol tartrate (LOPRESSOR) 50 MG tablet Take 50 mg by mouth 2 (two) times daily.  . multivitamin (MULTIVITAMIN) tablet Take by mouth  . valsartan (DIOVAN) 160 MG tablet Take 160 mg by mouth once daily.   No current facility-administered medications for this visit.   Allergies: Patient has no known allergies.  Social and Family History  Social History reports that he quit smoking about 34 years ago. His smoking use included cigarettes. He has never used smokeless tobacco. He reports that he drinks alcohol. He reports that he does not use drugs.  Family History Family History  Problem Relation Age of Onset  . No Known Problems Mother  . No Known Problems Father   Review of Systems   Review of Systems: The patient denies chest pain, reports mild chronic exertional shortness of breath, without orthopnea, paroxysmal nocturnal dyspnea, with chronic pedal edema, without palpitations, heart racing, presyncope, syncope. Review of 12 Systems is negative except as described above.  Physical Examination   Vitals: BP 122/80 (BP Location: Left upper arm, Patient Position: Sitting, BP Cuff Size: Adult)  Pulse 65  Wt 80.6 kg (177 lb 11.1 oz)  SpO2 98%  BMI 27.02 kg/m  Ht: Wt:80.6 kg (177 lb 11.1 oz) MIW:OEHO surface area is 1.97 meters squared. Body mass index is 27.02 kg/m.  General: Alert and oriented. Well-appearing. No acute distress. HEENT: Pupils equally reactive to light and accomodation  Neck: Supple, carotid pulses  2+ Lungs: Normal effort of breathing; clear to auscultation bilaterally; no wheezes, rales, rhonchi Heart: Regular rate and rhythm. 2/6 systolic ejection murmur Abdomen: soft nontender, nondistended, with normal bowel sounds Extremities: no cyanosis, clubbing, or edema Peripheral Pulses: 2+ radial bilaterally Skin: Warm, dry, no diaphoresis  Assessment   84 y.o. male with  1. RAS (renal artery stenosis) (CMS-HCC)  2. Peripheral vascular disease (CMS-HCC)  3. Moderate aortic stenosis  4. Essential hypertension  5. Coronary artery disease involving native coronary artery of native heart without angina pectoris  6. Acute diastolic CHF (congestive heart failure) (CMS-HCC)  7. S/P CABG x 4  8. Pedal edema  9. Other hyperlipidemia  10. Heart murmur, systolic, unspecified   84 year old gentleman with known coronary disease, currently without chest pain. Lexiscan sestamibi study 11/01/2012 revealed inferior and apical scar without ischemia. The patient has essential hypertension, blood pressure well controlled on current BP medications. Patient experiencing recent worsening peripheral edema, improved after increasing furosemide to 40 mg twice daily. 2D echocardiogram revealed mild reduced left ventricular function, with moderate aortic stenosis, with peak velocity 3.25 m/s, mean gradient 24.9 mmHg and peak gradient 42.3 mmHg.  Plan   1. Continue current medications 2. Counseled patient about low-sodium diet 3. DASH diet printed instructions given to the patient 4. Counseled patient about low-cholesterol diet 5. Continue atorvastatin for hyperlipidemia management 6. Low-fat and cholesterol diet printed instructions given to  the patient 7. Diabetes diet printed instructions given to the patient 8. Pacemaker clinic as scheduled 9. Continue furosemide 40 mg twice daily 10. Check BMP today 11. Return to clinic for follow-up in 3 months  Orders Placed This Encounter  Procedures  . Basic  Metabolic Panel (BMP)   Return in about 3 months (around 12/05/2017).  [name] Cowman, MD PhD [facility]   Electronically signed by [name] Cowman, MD at 09/04/2017 3:36 PM EDT  Plan of Treatment - documented as of this encounter  Upcoming Encounters Upcoming Encounters  Date Type Specialty Care Team Description  10/20/2017 Initial consult Neurology Melrose Nakayama, Liliane Shi, MD  [place]  [facility]  [place], [place] [address]  [phone]  [phone] ([address]    Hulan Amato, NP  [place]  [place], [place] [address]  [phone]  [phone] (Fax)    10/21/2017 Procedure visit Cardiology [name] Cowman, MD  [place]  [facility]  [place], [place] [address]  [phone]  [phone]    12/10/2017 Office Visit Cardiology [name], [place]  [facility]  [place], [place] [address]  [phone]  [phone] (Fax)    Procedures - documented in this encounter  Procedure Name Priority Date/Time Associated Diagnosis Comments  BASIC METABOLIC PANEL (BMP) - [facility], [facility] Routine 09/04/2017 3:11 PM EDT RAS (renal artery stenosis) (CMS-HCC)  Peripheral vascular disease (CMS-HCC)  Moderate aortic stenosis  Essential hypertension  Coronary artery disease involving native coronary artery of native heart without angina pectoris  Acute diastolic CHF (congestive heart failure) (CMS-HCC)  S/P CABG x 4  Pedal edema  Other hyperlipidemia  Heart murmur, systolic, unspecified  Results for this procedure are in the results section.   Lab Results - documented in this encounter   Basic Metabolic Panel (BMP) (26/41/5830 3:11 PM EDT) Basic Metabolic Panel (BMP) (94/08/6806 3:11 PM EDT)  Component Value Ref Range Performed At Pathologist Signature  Glucose 128 (H) 70 - 110 mg/dL [facility]     Sodium 138 136 - 145 mmol/L [facility]   Potassium 5.1 3.6 - 5.1 mmol/L [facility]   Chloride 100 97 - 109 mmol/L [facility]   Carbon Dioxide (CO2) 30.3 22.0 - 32.0 mmol/L [facility]   Calcium 9.8 8.7 - 10.3 mg/dL [facility]   Urea Nitrogen (BUN) 70 (H) 7 - 25 mg/dL [facility]   Creatinine 2.4 (H) 0.7 - 1.3 mg/dL [place] - LAB   Glomerular Filtration Rate (eGFR), MDRD Estimate 26 (L) >60 mL/min/1.73sq m [place] - LAB   BUN/Crea Ratio 29.2 (H) 6.0 - 20.0 [place] - LAB   Anion Gap w/K 12.8 6.0 - 16.0 [place] - LAB    Basic Metabolic Panel (BMP) (81/11/3157 3:11 PM EDT)  Specimen  Blood   Basic Metabolic Panel (BMP) (45/85/9292 3:11 PM EDT)  Performing Organization Address City/State/Zipcode Phone Number  [facility] - LAB  [place],  [address]    Visit Diagnoses - documented in this encounter  Diagnosis  RAS (renal artery stenosis) (CMS-HCC) - Primary  Atherosclerosis of renal artery   Peripheral vascular disease (CMS-HCC)  Unspecified peripheral vascular disease   Moderate aortic stenosis  Aortic valve disorders   Essential hypertension   Coronary artery disease involving native coronary artery of  native heart without angina pectoris   Acute diastolic CHF (congestive heart failure) (CMS-HCC)   S/P CABG x 4  Postsurgical aortocoronary bypass status   Pedal edema  Edema   Other hyperlipidemia   Heart murmur, systolic, unspecified   Historical Medications - added in this encounter  This list may reflect changes made after this encounter.  Medication Sig Dispensed Refills Start Date End Date  BD ULTRA-FINE SHORT PEN NEEDLE 31 gauge x 5/16" needle  USE AS DIRECTED 3 TIMES A DAY  5 08/07/2017   multivitamin (MULTIVITAMIN) tablet  Take by mouth  0     HYDROcodone-acetaminophen (NORCO) 10-325 mg tablet  Take by mouth  0 09/03/2017   Images Patient Contacts   Contact Name Contact Address Communication Relationship to Patient  Jazir Newey Unknown [phone] [facility]) Son or Daughter, Emergency Contact  Document Information  Primary Care Provider Other Service Providers Document Coverage Dates  Jannifer Hick, MD (Apr. 11, 2015April 11, 2015 - Present) DM: [id number] [phone] (Work) [phone] [facility]) [address] [place], [place] [address] Internal Medicine  Aug. 01, 2019August 01, 2019   [place] [address] [place], [place] [address]   Encounter Providers Encounter Date  [name] Cowman, MD (Attending) [phone] (Work) [phone] (Fax) [facility] [place],  [address] Cardiovascular Disease Aug. 01, 2019August 01, 2019

## 2017-10-28 NOTE — Patient Instructions (Signed)
Your procedure is scheduled on: 10-29-17 Report to Same Day Surgery 2nd floor medical mall [facility] Entrance-take elevator on left to 2nd floor.  Check in with surgery information desk.) To find out your arrival time please call [phone] between 1PM - 3PM on 10-28-17  Remember: Instructions that are not followed completely may result in serious medical risk, up to and including death, or upon the discretion of your surgeon and anesthesiologist your surgery may need to be rescheduled.    _x___ 1. Do not eat food after midnight the night before your procedure. You may drink WATER up to 2 hours before you are scheduled to arrive at the hospital for your procedure.  Do not drink WATER within 2 hours of your scheduled arrival to the hospital.  Type 1 and type 2 diabetics should only drink water.   ____Ensure clear carbohydrate drink on the way to the hospital for bariatric patients  ____Ensure clear carbohydrate drink 3 hours before surgery for Dr Dwyane Luo patients if physician instructed.   No gum chewing or hard candies.     __x__ 2. No Alcohol for 24 hours before or after surgery.   __x__3. No Smoking or e-cigarettes for 24 prior to surgery.  Do not use any chewable tobacco products for at least 6 hour prior to surgery   ____  4. Bring all medications with you on the day of surgery if instructed.    __x__ 5. Notify your doctor if there is any change in your medical condition     (cold, fever, infections).    x___6. On the morning of surgery brush your teeth with toothpaste and water.  You may rinse your mouth with mouth wash if you wish.  Do not swallow any toothpaste or mouthwash.   Do not wear jewelry, make-up, hairpins, clips or nail polish.  Do not wear lotions, powders, or perfumes. You may wear deodorant.  Do not shave 48 hours prior to surgery. Men may shave face and neck.  Do not bring valuables to the hospital.    [facility] is not responsible for any belongings or  valuables.               Contacts, dentures or bridgework may not be worn into surgery.  Leave your suitcase in the car. After surgery it may be brought to your room.  For patients admitted to the hospital, discharge time is determined by your treatment team.  _  Patients discharged the day of surgery will not be allowed to drive home.  You will need someone to drive you home and stay with you the night of your procedure.    Please read over the following fact sheets that you were given:   [facility] Preparing for Surgery    _x___ TAKE THE FOLLOWING MEDICATION THE MORNING OF SURGERY WITH A SMALL SIP OF WATER. These include:  1. AMLODIPINE  2. TRICOR  3. GABAPENTIN  4. METOPROLOL  5. HYDROCODONE  6.  ____Fleets enema or Magnesium Citrate as directed.   ____ Use CHG Soap or sage wipes as directed on instruction sheet   ____ Use inhalers on the day of surgery and bring to hospital day of surgery  ____ Stop Metformin and Janumet 2 days prior to surgery.    ____ Take 1/2 of usual insulin dose the night before surgery and none on the morning surgery.   _x___ Follow recommendations from Cardiologist, Pulmonologist or PCP regarding stopping Aspirin, Coumadin, Plavix ,Eliquis, Effient, or Pradaxa,  and Pletal-OK TO CONTINUE ASA AND PLAVIX-DO NOT TAKE AM OF SURGERY  ____Stop Anti-inflammatories such as Advil, Aleve, Ibuprofen, Motrin, Naproxen, Naprosyn, Goodies powders or aspirin products. OK to take Tylenol    _x___ Stop supplements until after surgery.     ____ Bring C-Pap to the hospital.

## 2017-10-28 NOTE — Telephone Encounter (Signed)
Yes they can try increasing the gabapentin frequency to 4 times daily

## 2017-10-28 NOTE — Telephone Encounter (Signed)
Spoke with Kathlee Nations, pt's daughter; per [facility] request; to let her know that the Gabapentin can be taken four times daily. Kathlee Nations stated that the pt is having a toe amputation tomorrow and they don't think they will be changing any of his medications until Friday.

## 2017-10-28 NOTE — Telephone Encounter (Signed)
[name] with [facility] is wanting to know if you are okay with increasing the pt's Gabapentin 300mg  to 4 times daily.

## 2017-10-29 ENCOUNTER — Encounter: Payer: Self-pay | Admitting: Anesthesiology

## 2017-10-29 ENCOUNTER — Other Ambulatory Visit: Payer: Self-pay

## 2017-10-29 ENCOUNTER — Ambulatory Visit: Payer: Medicare Other | Admitting: Certified Registered"

## 2017-10-29 ENCOUNTER — Ambulatory Visit
Admission: RE | Admit: 2017-10-29 | Discharge: 2017-10-29 | Disposition: A | Discharge status: Home / Self Care | Payer: Medicare Other | Source: Ambulatory Visit | Attending: Vascular Surgery | Admitting: Vascular Surgery

## 2017-10-29 ENCOUNTER — Encounter
Admission: RE | Disposition: A | Discharge status: Home / Self Care | Payer: Self-pay | Source: Ambulatory Visit | Attending: Vascular Surgery

## 2017-10-29 DIAGNOSIS — E1169 Type 2 diabetes mellitus with other specified complication: Secondary | ICD-10-CM | POA: Insufficient documentation

## 2017-10-29 DIAGNOSIS — E1122 Type 2 diabetes mellitus with diabetic chronic kidney disease: Secondary | ICD-10-CM | POA: Insufficient documentation

## 2017-10-29 DIAGNOSIS — N183 Chronic kidney disease, stage 3 (moderate): Secondary | ICD-10-CM | POA: Diagnosis not present

## 2017-10-29 DIAGNOSIS — Z87891 Personal history of nicotine dependence: Secondary | ICD-10-CM | POA: Diagnosis not present

## 2017-10-29 DIAGNOSIS — Z794 Long term (current) use of insulin: Secondary | ICD-10-CM | POA: Diagnosis not present

## 2017-10-29 DIAGNOSIS — I2510 Atherosclerotic heart disease of native coronary artery without angina pectoris: Secondary | ICD-10-CM | POA: Insufficient documentation

## 2017-10-29 DIAGNOSIS — L97529 Non-pressure chronic ulcer of other part of left foot with unspecified severity: Secondary | ICD-10-CM | POA: Insufficient documentation

## 2017-10-29 DIAGNOSIS — Z95820 Peripheral vascular angioplasty status with implants and grafts: Secondary | ICD-10-CM | POA: Diagnosis not present

## 2017-10-29 DIAGNOSIS — N184 Chronic kidney disease, stage 4 (severe): Secondary | ICD-10-CM | POA: Diagnosis not present

## 2017-10-29 DIAGNOSIS — L97526 Non-pressure chronic ulcer of other part of left foot with bone involvement without evidence of necrosis: Secondary | ICD-10-CM | POA: Diagnosis not present

## 2017-10-29 DIAGNOSIS — E785 Hyperlipidemia, unspecified: Secondary | ICD-10-CM | POA: Diagnosis not present

## 2017-10-29 DIAGNOSIS — I129 Hypertensive chronic kidney disease with stage 1 through stage 4 chronic kidney disease, or unspecified chronic kidney disease: Secondary | ICD-10-CM | POA: Insufficient documentation

## 2017-10-29 DIAGNOSIS — Z7951 Long term (current) use of inhaled steroids: Secondary | ICD-10-CM | POA: Insufficient documentation

## 2017-10-29 DIAGNOSIS — I96 Gangrene, not elsewhere classified: Secondary | ICD-10-CM | POA: Diagnosis not present

## 2017-10-29 DIAGNOSIS — E11621 Type 2 diabetes mellitus with foot ulcer: Secondary | ICD-10-CM | POA: Insufficient documentation

## 2017-10-29 DIAGNOSIS — M869 Osteomyelitis, unspecified: Secondary | ICD-10-CM | POA: Diagnosis not present

## 2017-10-29 DIAGNOSIS — Z85828 Personal history of other malignant neoplasm of skin: Secondary | ICD-10-CM | POA: Insufficient documentation

## 2017-10-29 DIAGNOSIS — E1151 Type 2 diabetes mellitus with diabetic peripheral angiopathy without gangrene: Secondary | ICD-10-CM | POA: Diagnosis not present

## 2017-10-29 DIAGNOSIS — I7025 Atherosclerosis of native arteries of other extremities with ulceration: Secondary | ICD-10-CM

## 2017-10-29 HISTORY — PX: AMPUTATION: SHX166

## 2017-10-29 LAB — CBC WITH DIFFERENTIAL/PLATELET
Basophils Absolute: 0.1 10*3/uL (ref 0–0.1)
Basophils Relative: 1 %
EOS ABS: 0.4 10*3/uL (ref 0–0.7)
Eosinophils Relative: 5 %
HCT: 31.9 % — ABNORMAL LOW (ref 40.0–52.0)
Hemoglobin: 10.8 g/dL — ABNORMAL LOW (ref 13.0–18.0)
LYMPHS ABS: 1 10*3/uL (ref 1.0–3.6)
LYMPHS PCT: 13 %
MCH: 31.9 pg (ref 26.0–34.0)
MCHC: 33.7 g/dL (ref 32.0–36.0)
MCV: 94.6 fL (ref 80.0–100.0)
MONO ABS: 0.6 10*3/uL (ref 0.2–1.0)
Monocytes Relative: 8 %
Neutro Abs: 5.7 10*3/uL (ref 1.4–6.5)
Neutrophils Relative %: 73 %
PLATELETS: 218 10*3/uL (ref 150–440)
RBC: 3.38 MIL/uL — AB (ref 4.40–5.90)
RDW: 15.3 % — ABNORMAL HIGH (ref 11.5–14.5)
WBC: 7.8 10*3/uL (ref 3.8–10.6)

## 2017-10-29 LAB — BASIC METABOLIC PANEL
Anion gap: 10 (ref 5–15)
BUN: 50 mg/dL — AB (ref 8–23)
CHLORIDE: 104 mmol/L (ref 98–111)
CO2: 25 mmol/L (ref 22–32)
CREATININE: 2.19 mg/dL — AB (ref 0.61–1.24)
Calcium: 9.4 mg/dL (ref 8.9–10.3)
GFR calc Af Amer: 29 mL/min — ABNORMAL LOW (ref >60)
GFR, EST NON AFRICAN AMERICAN: 25 mL/min — AB (ref >60)
GLUCOSE: 156 mg/dL — AB (ref 70–99)
Potassium: 4.2 mmol/L (ref 3.5–5.1)
SODIUM: 139 mmol/L (ref 135–145)

## 2017-10-29 LAB — TYPE AND SCREEN
ABO/RH(D): A POS
ANTIBODY SCREEN: NEGATIVE

## 2017-10-29 LAB — GLUCOSE, CAPILLARY
Glucose-Capillary: 146 mg/dL — ABNORMAL HIGH (ref 70–99)
Glucose-Capillary: 137 mg/dL — ABNORMAL HIGH (ref 70–99)

## 2017-10-29 LAB — ABO/RH: ABO/RH(D): A POS

## 2017-10-29 LAB — PROTIME-INR
INR: 1.06
PROTHROMBIN TIME: 13.7 s (ref 11.4–15.2)

## 2017-10-29 LAB — APTT: APTT: 33 s (ref 24–36)

## 2017-10-29 SURGERY — AMPUTATION, FOOT, RAY
Anesthesia: Monitor Anesthesia Care | Laterality: Left

## 2017-10-29 MED ORDER — FENTANYL CITRATE (PF) 100 MCG/2ML IJ SOLN: 25.0000 ug | INTRAMUSCULAR | Status: DC | PRN

## 2017-10-29 MED ORDER — HYDROCODONE-ACETAMINOPHEN 10-325 MG PO TABS: 1.0000 | ORAL_TABLET | Freq: Four times a day (QID) | ORAL | 0 refills | Status: DC | PRN

## 2017-10-29 MED ORDER — CEFAZOLIN SODIUM-DEXTROSE 2-4 GM/100ML-% IV SOLN
INTRAVENOUS | Status: AC
  Filled 2017-10-29: qty 100

## 2017-10-29 MED ORDER — BUPIVACAINE HCL (PF) 0.5 % IJ SOLN
INTRAMUSCULAR | Status: AC
  Filled 2017-10-29: qty 30

## 2017-10-29 MED ORDER — OXYCODONE HCL 5 MG PO TABS: 5.0000 mg | ORAL_TABLET | Freq: Once | ORAL | Status: DC | PRN

## 2017-10-29 MED ORDER — OXYCODONE HCL 5 MG/5ML PO SOLN: 5.0000 mg | Freq: Once | ORAL | Status: DC | PRN

## 2017-10-29 MED ORDER — TRAMADOL HCL 50 MG PO TABS: 50.0000 mg | ORAL_TABLET | Freq: Two times a day (BID) | ORAL | 0 refills | Status: DC | PRN

## 2017-10-29 MED ORDER — FAMOTIDINE 20 MG PO TABS
ORAL_TABLET | ORAL | Status: AC
  Administered 2017-10-29: 20 mg via ORAL
  Filled 2017-10-29: qty 1

## 2017-10-29 MED ORDER — FENTANYL CITRATE (PF) 100 MCG/2ML IJ SOLN
12.5000 ug | Freq: Once | INTRAMUSCULAR | Status: AC | PRN
  Administered 2017-10-29: 50 ug via INTRAVENOUS

## 2017-10-29 MED ORDER — ONDANSETRON HCL 4 MG/2ML IJ SOLN: 4.0000 mg | Freq: Four times a day (QID) | INTRAMUSCULAR | Status: DC | PRN

## 2017-10-29 MED ORDER — PROPOFOL 500 MG/50ML IV EMUL
INTRAVENOUS | Status: DC | PRN
  Administered 2017-10-29: 50 ug/kg/min via INTRAVENOUS

## 2017-10-29 MED ORDER — LIDOCAINE HCL 1 % IJ SOLN
INTRAMUSCULAR | Status: DC | PRN
  Administered 2017-10-29: 20 mL

## 2017-10-29 MED ORDER — PROPOFOL 10 MG/ML IV BOLUS
INTRAVENOUS | Status: DC | PRN
  Administered 2017-10-29: 30 mg via INTRAVENOUS

## 2017-10-29 MED ORDER — FAMOTIDINE 20 MG PO TABS
20.0000 mg | ORAL_TABLET | Freq: Once | ORAL | Status: AC
  Administered 2017-10-29: 20 mg via ORAL

## 2017-10-29 MED ORDER — LIDOCAINE HCL (CARDIAC) PF 100 MG/5ML IV SOSY
PREFILLED_SYRINGE | INTRAVENOUS | Status: DC | PRN
  Administered 2017-10-29: 80 mg via INTRAVENOUS

## 2017-10-29 MED ORDER — LIDOCAINE HCL (PF) 1 % IJ SOLN
INTRAMUSCULAR | Status: AC
  Filled 2017-10-29: qty 30

## 2017-10-29 MED ORDER — FENTANYL CITRATE (PF) 100 MCG/2ML IJ SOLN
INTRAMUSCULAR | Status: AC
  Filled 2017-10-29: qty 2

## 2017-10-29 MED ORDER — SODIUM CHLORIDE 0.9 % IV SOLN
INTRAVENOUS | Status: DC
  Administered 2017-10-29: 13:00:00 via INTRAVENOUS

## 2017-10-29 MED ORDER — PROPOFOL 10 MG/ML IV BOLUS
INTRAVENOUS | Status: AC
  Filled 2017-10-29: qty 20

## 2017-10-29 MED ORDER — ONDANSETRON HCL 4 MG/2ML IJ SOLN: 4.0000 mg | Freq: Once | INTRAMUSCULAR | Status: DC | PRN

## 2017-10-29 SURGICAL SUPPLY — 27 items
BANDAGE ELASTIC 6 LF NS (GAUZE/BANDAGES/DRESSINGS) ×3 IMPLANT
BNDG GAUZE 4.5X4.1 6PLY STRL (MISCELLANEOUS) ×6 IMPLANT
BRUSH SCRUB EZ  4% CHG (MISCELLANEOUS) ×2
BRUSH SCRUB EZ 4% CHG (MISCELLANEOUS) ×1 IMPLANT
CANISTER SUCT 1200ML W/VALVE (MISCELLANEOUS) ×3 IMPLANT
CHLORAPREP W/TINT 26ML (MISCELLANEOUS) ×3 IMPLANT
ELECT CAUTERY BLADE 6.4 (BLADE) ×3 IMPLANT
ELECT REM PT RETURN 9FT ADLT (ELECTROSURGICAL) ×3
ELECTRODE REM PT RTRN 9FT ADLT (ELECTROSURGICAL) ×1 IMPLANT
GAUZE PETRO XEROFOAM 1X8 (MISCELLANEOUS) ×3 IMPLANT
GAUZE SPONGE 4X4 12PLY STRL (GAUZE/BANDAGES/DRESSINGS) ×3 IMPLANT
GLOVE BIO SURGEON STRL SZ7 (GLOVE) ×3 IMPLANT
GOWN STRL REUS W/ TWL LRG LVL3 (GOWN DISPOSABLE) ×1 IMPLANT
GOWN STRL REUS W/ TWL XL LVL3 (GOWN DISPOSABLE) ×1 IMPLANT
GOWN STRL REUS W/TWL LRG LVL3 (GOWN DISPOSABLE) ×2
GOWN STRL REUS W/TWL XL LVL3 (GOWN DISPOSABLE) ×2
HANDLE YANKAUER SUCT BULB TIP (MISCELLANEOUS) ×3 IMPLANT
KIT TURNOVER KIT A (KITS) ×3 IMPLANT
LABEL OR SOLS (LABEL) IMPLANT
NS IRRIG 1000ML POUR BTL (IV SOLUTION) ×3 IMPLANT
PACK EXTREMITY ARMC (MISCELLANEOUS) ×3 IMPLANT
PAD PREP 24X41 OB/GYN DISP (PERSONAL CARE ITEMS) ×3 IMPLANT
SOL PREP PVP 2OZ (MISCELLANEOUS)
SOLUTION PREP PVP 2OZ (MISCELLANEOUS) IMPLANT
SPONGE LAP 18X18 RF (DISPOSABLE) ×3 IMPLANT
STOCKINETTE BIAS CUT 6 980064 (GAUZE/BANDAGES/DRESSINGS) IMPLANT
SUT VIC AB 2-0 UR6 27 (SUTURE) ×3 IMPLANT

## 2017-10-29 NOTE — Op Note (Signed)
  OPERATIVE NOTE   PROCEDURE: Left third toe Ray amputation  PRE-OPERATIVE DIAGNOSIS: left third toe ulceration and osteomyelitis  POST-OPERATIVE DIAGNOSIS: same as above  SURGEON: Leotis Pain, MD  ASSISTANT(S): none  ANESTHESIA: general  ESTIMATED BLOOD LOSS: 5 cc  FINDING(S): none  SPECIMEN(S): left third toe and metatarsal head  INDICATIONS:  Patient is a 84 y.o.male who presents with left third toe ulceration with exposed bone and clear osteomyelitis. The patient is scheduled for digital amputation. I discussed in depth with the patient the risks, benefits, and alternatives to this procedure. The patient is aware that the risk of this operation included but are not limited to: bleeding, infection, myocardial infarction, stroke, death, failure to heal amputation wound, and possible need for more proximal amputation. The patient is aware of the risks and agrees proceed forward with the procedure.  DESCRIPTION: After full informed written consent was obtained from the patient, the patient was brought back to the operating room, and placed supine upon the operating table. Prior to induction, the patient received IV antibiotics. The patient was then prepped and draped in the standard fashion. Curvilinear incision was made at the base of the left third toe. We dissected down to the bone with electrocautery and sharp dissection. The digital vessels were controlled with electrocautery. The left third toe was removed its entirety with dissection with electrocautery and blunt dissection. The metatarsal head was taken back with small bone cutter and Ronjours to allow tension-free closure. The wound was then closed with figure-of-eight 2-0 Vicryl and several 3-0 nylons. Sterile dressing was placed.  COMPLICATIONS: none  CONDITION: stable   Leotis Pain, MD 10/29/2017 3:13 PM

## 2017-10-29 NOTE — Discharge Instructions (Signed)

## 2017-10-29 NOTE — Anesthesia Post-op Follow-up Note (Signed)
Anesthesia QCDR form completed.        

## 2017-10-29 NOTE — H&P (Signed)
[facility] History & Physical Update  The patient was interviewed and re-examined.  The patient's previous History and Physical has been reviewed and is unchanged.  There is no change in the plan of care. We plan to proceed with the scheduled procedure.  Leotis Pain, MD  10/29/2017, 1:38 PM

## 2017-10-29 NOTE — Transfer of Care (Signed)
Immediate Anesthesia Transfer of Care Note  Patient: [name]  Procedure(s) Performed: AMPUTATION RAY ( THIRD TOE ) (Left )  Patient Location: PACU  Anesthesia Type:MAC  Level of Consciousness: sedated and responds to stimulation  Airway & Oxygen Therapy: Patient Spontanous Breathing and Patient connected to face mask oxygen  Post-op Assessment: Report given to RN and Post -op Vital signs reviewed and stable  Post vital signs: Reviewed and stable  Last Vitals:  Vitals Value Taken Time  BP 112/50 10/29/2017  3:12 PM  Temp    Pulse 65 10/29/2017  3:12 PM  Resp 11 10/29/2017  3:12 PM  SpO2 100 % 10/29/2017  3:12 PM  Vitals shown include unvalidated device data.  Last Pain: There were no vitals filed for this visit.       Complications: No apparent anesthesia complications

## 2017-10-29 NOTE — OR Nursing (Signed)
Discharge instructions clarified with Dr Lucky Cowboy. Pt allowed to  remove dressing in 48 hours and shower. Dry foot well. Does not need dressing on foot. Follow up with Dr Lucky Cowboy October 4 at 3:45pm.

## 2017-10-29 NOTE — Anesthesia Preprocedure Evaluation (Addendum)
Anesthesia Evaluation  Patient identified by MRN, date of birth, ID band Patient awake    Reviewed: Allergy & Precautions, H&P , NPO status , Patient's Chart, lab work & pertinent test results, reviewed documented beta blocker date and time   History of Anesthesia Complications Negative for: history of anesthetic complications  Airway Mallampati: II  TM Distance: >3 FB Neck ROM: full    Dental  (+) Poor Dentition   Pulmonary neg shortness of breath, former smoker,    Pulmonary exam normal        Cardiovascular Exercise Tolerance: Good hypertension, (-) angina+ CAD and + Peripheral Vascular Disease  Normal cardiovascular exam+ Valvular Problems/Murmurs  Rate:Normal     Neuro/Psych negative neurological ROS  negative psych ROS   GI/Hepatic negative GI ROS, Neg liver ROS, neg GERD  ,  Endo/Other  diabetes, Type 2, Insulin Dependent  Renal/GU CRF and Renal InsufficiencyRenal disease  negative genitourinary   Musculoskeletal  (+) Arthritis , Osteoarthritis,    Abdominal   Peds  Hematology negative hematology ROS (+) anemia ,   Anesthesia Other Findings Past Medical History: No date: Arthritis No date: Blood transfusion No date: Cancer ([place])     Comment:  skin cancer on forehead No date: Coronary artery disease     Comment:  h/o bypass coronary- 2001 & peripheral- 2011 , last full              cardiac visit  in [place] , [place], [employer] No date: Diabetes mellitus 1941: H/O: malaria     Comment:  in [place] No date: Hyperlipidemia No date: Hypertension No date: Peripheral artery disease ([place])     Comment:  folowed by [place] bypass 2011 [place] No date: Renal insufficiency     Comment:  by Nov 2012 labs, no old records available  Past Surgical History: 2001: CORONARY ARTERY BYPASS GRAFT     Comment:  4 vessel, done in [place]  2011: FEMORAL BYPASS     Comment:  done in [place],  now followed by Leotis Pain No date: INSERT / REPLACE / REMOVE PACEMAKER     Comment:  2004 08/18/2017: LOWER EXTREMITY ANGIOGRAPHY; Left     Comment:  Procedure: LOWER EXTREMITY ANGIOGRAPHY;  Surgeon: Algernon Huxley, MD;  Location: [facility];  Service:               Cardiovascular;  Laterality: Left; 10/20/2017: LOWER EXTREMITY ANGIOGRAPHY; Left     Comment:  Procedure: LOWER EXTREMITY ANGIOGRAPHY;  Surgeon: Algernon Huxley, MD;  Location: [facility];  Service:               Cardiovascular;  Laterality: Left; 10/27/2017: LOWER EXTREMITY ANGIOGRAPHY; Right     Comment:  Procedure: LOWER EXTREMITY ANGIOGRAPHY;  Surgeon: Algernon Huxley, MD;  Location: [facility];  Service:               Cardiovascular;  Laterality: Right; 01/16/2011: LUMBAR LAMINECTOMY/DECOMPRESSION MICRODISCECTOMY     Comment:  Procedure: LUMBAR LAMINECTOMY/DECOMPRESSION               MICRODISCECTOMY;  Surgeon: Ophelia Charter;  Location:  [facility];  Service: Neurosurgery;  Laterality: N/A;                Lumbar four and lumbar five laminectomies  No date: PACEMAKER INSERTION No date: SPINE SURGERY     Reproductive/Obstetrics negative OB ROS                            Anesthesia Physical  Anesthesia Plan  ASA: III  Anesthesia Plan: MAC and General   Post-op Pain Management:    Induction:   PONV Risk Score and Plan: Propofol infusion and TIVA  Airway Management Planned: Nasal Cannula  Additional Equipment:   Intra-op Plan:   Post-operative Plan:   Informed Consent: I have reviewed the patients History and Physical, chart, labs and discussed the procedure including the risks, benefits and alternatives for the proposed anesthesia with the patient or authorized representative who has indicated his/her understanding and acceptance.   Dental Advisory Given  Plan Discussed with: CRNA  Anesthesia Plan Comments:         Anesthesia Quick Evaluation

## 2017-10-29 NOTE — Anesthesia Postprocedure Evaluation (Signed)
Anesthesia Post Note  Patient: [name]  Procedure(s) Performed: AMPUTATION RAY ( THIRD TOE ) (Left )  Patient location during evaluation: PACU Anesthesia Type: MAC Level of consciousness: awake and alert Pain management: pain level controlled Vital Signs Assessment: post-procedure vital signs reviewed and stable Respiratory status: spontaneous breathing, nonlabored ventilation, respiratory function stable and patient connected to nasal cannula oxygen Cardiovascular status: blood pressure returned to baseline and stable Postop Assessment: no apparent nausea or vomiting Anesthetic complications: no     Last Vitals:  Vitals:   10/29/17 1611 10/29/17 1639  BP: (!) 141/47 (!) 133/43  Pulse: 64 64  Resp: 15 16  Temp:    SpO2: 100% 100%    Last Pain:  Vitals:   10/29/17 1611  PainSc: 0-No pain                 [name] S

## 2017-10-30 ENCOUNTER — Encounter: Payer: Self-pay | Admitting: Vascular Surgery

## 2017-10-30 ENCOUNTER — Encounter: Payer: Medicare Other | Admitting: Physician Assistant

## 2017-10-30 DIAGNOSIS — E11621 Type 2 diabetes mellitus with foot ulcer: Secondary | ICD-10-CM | POA: Diagnosis not present

## 2017-10-30 DIAGNOSIS — Z794 Long term (current) use of insulin: Secondary | ICD-10-CM | POA: Diagnosis not present

## 2017-10-30 DIAGNOSIS — I70203 Unspecified atherosclerosis of native arteries of extremities, bilateral legs: Secondary | ICD-10-CM | POA: Diagnosis not present

## 2017-10-30 DIAGNOSIS — M79604 Pain in right leg: Secondary | ICD-10-CM | POA: Diagnosis not present

## 2017-10-30 DIAGNOSIS — I10 Essential (primary) hypertension: Secondary | ICD-10-CM | POA: Diagnosis not present

## 2017-10-30 DIAGNOSIS — L97513 Non-pressure chronic ulcer of other part of right foot with necrosis of muscle: Secondary | ICD-10-CM | POA: Diagnosis not present

## 2017-10-30 NOTE — Addendum Note (Signed)
Addendum  created 10/30/17 1509 by Doreen Salvage, CRNA   Charge Capture section accepted

## 2017-10-31 ENCOUNTER — Encounter: Payer: Medicare Other | Admitting: Physician Assistant

## 2017-10-31 DIAGNOSIS — I132 Hypertensive heart and chronic kidney disease with heart failure and with stage 5 chronic kidney disease, or end stage renal disease: Secondary | ICD-10-CM | POA: Diagnosis not present

## 2017-10-31 DIAGNOSIS — L97529 Non-pressure chronic ulcer of other part of left foot with unspecified severity: Secondary | ICD-10-CM | POA: Diagnosis not present

## 2017-10-31 DIAGNOSIS — L97512 Non-pressure chronic ulcer of other part of right foot with fat layer exposed: Secondary | ICD-10-CM | POA: Diagnosis not present

## 2017-10-31 DIAGNOSIS — L97526 Non-pressure chronic ulcer of other part of left foot with bone involvement without evidence of necrosis: Secondary | ICD-10-CM | POA: Diagnosis not present

## 2017-10-31 DIAGNOSIS — E1122 Type 2 diabetes mellitus with diabetic chronic kidney disease: Secondary | ICD-10-CM | POA: Diagnosis not present

## 2017-10-31 DIAGNOSIS — I739 Peripheral vascular disease, unspecified: Secondary | ICD-10-CM | POA: Diagnosis not present

## 2017-10-31 DIAGNOSIS — E1140 Type 2 diabetes mellitus with diabetic neuropathy, unspecified: Secondary | ICD-10-CM | POA: Diagnosis not present

## 2017-10-31 DIAGNOSIS — Z515 Encounter for palliative care: Secondary | ICD-10-CM | POA: Diagnosis not present

## 2017-10-31 DIAGNOSIS — E11621 Type 2 diabetes mellitus with foot ulcer: Secondary | ICD-10-CM | POA: Diagnosis not present

## 2017-10-31 LAB — SURGICAL PATHOLOGY

## 2017-11-02 NOTE — Progress Notes (Signed)
ARJAN, STROHM ([id number]) Visit Report for 10/31/2017 Arrival Information Details Patient Name: Ingwersen, EAMES DIBIASIO. Date of Service: 10/31/2017 10:15 AM Medical Record Number: [id number] Patient Account Number: [id number] Date of Birth/Sex: 05/26/1924 (84 y.o. M) Treating RN: Montey Hora Primary Care Semiah Konczal: Deborra Medina Other Clinician: Referring Sotiria Keast: Deborra Medina Treating Ravon Mcilhenny/Extender: Melburn Hake, HOYT Weeks in Treatment: 3 Visit Information History Since Last Visit Added or deleted any medications: No Patient Arrived: Wheel Chair Any new allergies or adverse reactions: No Arrival Time: 10:22 Had a fall or experienced change in No Accompanied By: self activities of daily living that may affect Transfer Assistance: None risk of falls: Patient Identification Verified: Yes Signs or symptoms of abuse/neglect since last visito No Secondary Verification Process Completed: Yes Hospitalized since last visit: No Implantable device outside of the clinic excluding No cellular tissue based products placed in the center since last visit: Has Dressing in Place as Prescribed: Yes Pain Present Now: No Electronic Signature(s) Signed: 10/31/2017 11:52:00 AM By: Lorine Bears RCP, RRT, CHT Entered By: Becky Sax, Amado Nash on 10/31/2017 10:23:27 Lama, [name] ([id number]) -------------------------------------------------------------------------------- Lower Extremity Assessment Details Patient Name: Hume, Mayur S. Date of Service: 10/31/2017 10:15 AM Medical Record Number: [id number] Patient Account Number: [id number] Date of Birth/Sex: 05/26/1924 (84 y.o. M) Treating RN: Roger Shelter Primary Care Laketha Leopard: Deborra Medina Other Clinician: Referring Seda Kronberg: Deborra Medina Treating Nataliyah Packham/Extender: Melburn Hake, HOYT Weeks in Treatment: 3 Edema Assessment Assessed: [Left: No] [Right: No] Edema: [Left: Ye] [Right: s] Calf Left: Right: Point  of Measurement: 34 cm From Medial Instep cm 34 cm Ankle Left: Right: Point of Measurement: 10 cm From Medial Instep cm 25.8 cm Vascular Assessment Claudication: Claudication Assessment [Right:None] Pulses: Dorsalis Pedis Palpable: [Right:Yes] Posterior Tibial Extremity colors, hair growth, and conditions: Extremity Color: [Right:Hyperpigmented] Hair Growth on Extremity: [Right:No] Temperature of Extremity: [Right:Warm] Capillary Refill: [Right:< 3 seconds] Toe Nail Assessment Left: Right: Thick: No Discolored: No Deformed: Yes Improper Length and Hygiene: No Electronic Signature(s) Signed: 10/31/2017 4:22:48 PM By: Roger Shelter Entered By: Roger Shelter on 10/31/2017 10:32:42 Cordon, [name] ([id number]) -------------------------------------------------------------------------------- Multi Wound Chart Details Patient Name: Loveall, Kolyn S. Date of Service: 10/31/2017 10:15 AM Medical Record Number: [id number] Patient Account Number: [id number] Date of Birth/Sex: 1924/03/23 (84 y.o. M) Treating RN: Montey Hora Primary Care Yenny Kosa: Deborra Medina Other Clinician: Referring Lashun Mccants: Deborra Medina Treating Morgon Pamer/Extender: Melburn Hake, HOYT Weeks in Treatment: 3 Vital Signs Height(in): 68 Pulse(bpm): 70 Weight(lbs): 180 Blood Pressure(mmHg): 132/52 Body Mass Index(BMI): 27 Temperature(F): 98.3 Respiratory Rate 18 (breaths/min): Photos: [1:No Photos] [2:No Photos] [N/A:N/A] Wound Location: [1:Right Toe Second] [2:Left Toe Third] [N/A:N/A] Wounding Event: [1:Gradually Appeared] [2:Gradually Appeared] [N/A:N/A] Primary Etiology: [1:Diabetic Wound/Ulcer of the Lower Extremity] [2:Diabetic Wound/Ulcer of the Lower Extremity] [N/A:N/A] Secondary Etiology: [1:Arterial Insufficiency Ulcer] [2:Arterial Insufficiency Ulcer] [N/A:N/A] Comorbid History: [1:Cataracts, Lymphedema, Coronary Artery Disease, Hypertension, Peripheral Venous Disease, Type II Diabetes, End  Stage Renal Disease, Rheumatoid Arthritis, Neuropathy] [2:N/A] [N/A:N/A] Date Acquired: [1:06/04/2017] [2:06/04/2017] [N/A:N/A] Weeks of Treatment: [1:3] [2:3] [N/A:N/A] Wound Status: [1:Open] [2:Amputation] [N/A:N/A] Pending Amputation on [1:Yes] [2:Yes] [N/A:N/A] Presentation: Measurements L x W x D [1:0.6x0.7x0.1] [2:0x0x0] [N/A:N/A] (cm) Area (cm) : [1:0.33] [2:0] [N/A:N/A] Volume (cm) : [1:0.033] [2:0] [N/A:N/A] % Reduction in Area: [1:72.00%] [2:100.00%] [N/A:N/A] % Reduction in Volume: [1:86.00%] [2:100.00%] [N/A:N/A] Classification: [1:Grade 2] [2:Grade 2] [N/A:N/A] Exudate Amount: [1:Small] [2:N/A] [N/A:N/A] Exudate Type: [1:Serous] [2:N/A] [N/A:N/A] Exudate Color: [1:amber] [2:N/A] [N/A:N/A] Wound Margin: [1:Distinct, outline attached] [2:N/A] [N/A:N/A] Granulation Amount: [1:Medium (34-66%)] [2:N/A] [N/A:N/A] Granulation Quality: [1:Red, Pink] [  2:N/A] [N/A:N/A] Necrotic Amount: [1:Medium (34-66%)] [2:N/A] [N/A:N/A] Necrotic Tissue: [1:Eschar, Adherent Slough] [2:N/A] [N/A:N/A] Exposed Structures: [1:Fat Layer (Subcutaneous Tissue) Exposed: Yes Fascia: No Tendon: No] [2:N/A] [N/A:N/A] Muscle: No Joint: No Bone: No Epithelialization: None N/A N/A Periwound Skin Texture: Excoriation: No No Abnormalities Noted N/A Induration: No Callus: No Crepitus: No Rash: No Scarring: No Periwound Skin Moisture: Maceration: Yes No Abnormalities Noted N/A Dry/Scaly: No Periwound Skin Color: Atrophie Blanche: No No Abnormalities Noted N/A Cyanosis: No Ecchymosis: No Erythema: No Hemosiderin Staining: No Mottled: No Pallor: No Rubor: No Temperature: No Abnormality N/A N/A Tenderness on Palpation: No No N/A Wound Preparation: Ulcer Cleansing: N/A N/A Rinsed/Irrigated with Saline Topical Anesthetic Applied: Other: lidocaince 4% Treatment Notes Electronic Signature(s) Signed: 10/31/2017 5:07:50 PM By: Montey Hora Entered By: Montey Hora on 10/31/2017 10:52:38 Kall,  [name] ([id number]) -------------------------------------------------------------------------------- [place] Details Patient Name: Wandler, Harbor S. Date of Service: 10/31/2017 10:15 AM Medical Record Number: [id number] Patient Account Number: [id number] Date of Birth/Sex: 20-Sep-1924 (84 y.o. M) Treating RN: Montey Hora Primary Care Zyden Suman: Deborra Medina Other Clinician: Referring Shahid Flori: Deborra Medina Treating Kimaria Struthers/Extender: Melburn Hake, HOYT Weeks in Treatment: 3 Active Inactive ` Abuse / Safety / Falls / Self Care Management Nursing Diagnoses: Potential for falls Goals: Patient will remain injury free related to falls Date Initiated: 10/10/2017 Target Resolution Date: 01/02/2018 Goal Status: Active Interventions: Assess fall risk on admission and as needed Notes: ` Orientation to the Wound Care Program Nursing Diagnoses: Knowledge deficit related to the wound healing center program Goals: Patient/caregiver will verbalize understanding of the [place] [facility] Date Initiated: 10/10/2017 Target Resolution Date: 01/02/2018 Goal Status: Active Interventions: Provide education on orientation to the wound center Notes: ` Pain, Acute or Chronic Nursing Diagnoses: Potential alteration in comfort, pain Goals: Patient/caregiver will verbalize adequate pain control between visits Date Initiated: 10/10/2017 Target Resolution Date: 01/02/2018 Goal Status: Active Interventions: Kassebaum, TRAQUAN DUARTE ([id number]) Complete pain assessment as per visit requirements Notes: ` Wound/Skin Impairment Nursing Diagnoses: Impaired tissue integrity Goals: Ulcer/skin breakdown will heal within 14 weeks Date Initiated: 10/10/2017 Target Resolution Date: 01/02/2018 Goal Status: Active Interventions: Assess patient/caregiver ability to obtain necessary supplies Assess patient/caregiver ability to perform ulcer/skin care regimen upon admission and as  needed Assess ulceration(s) every visit Notes: Electronic Signature(s) Signed: 10/31/2017 5:07:50 PM By: Montey Hora Entered By: Montey Hora on 10/31/2017 10:52:29 Spivack, [name] ([id number]) -------------------------------------------------------------------------------- Pain Assessment Details Patient Name: Brugger, Georgi S. Date of Service: 10/31/2017 10:15 AM Medical Record Number: [id number] Patient Account Number: [id number] Date of Birth/Sex: [birth year] (84 y.o. M) Treating RN: Montey Hora Primary Care Hero Mccathern: Deborra Medina Other Clinician: Referring Kennis Buell: Deborra Medina Treating Tiwanna Tuch/Extender: Melburn Hake, HOYT Weeks in Treatment: 3 Active Problems Location of Pain Severity and Description of Pain Patient Has Paino No Site Locations Pain Management and Medication Current Pain Management: Electronic Signature(s) Signed: 10/31/2017 11:52:00 AM By: Lorine Bears RCP, RRT, CHT Signed: 10/31/2017 5:07:50 PM By: Montey Hora Entered By: Lorine Bears on 10/31/2017 10:23:35 Takach, [name] ([id number]) -------------------------------------------------------------------------------- Patient/Caregiver Education Details Patient Name: Gobin, Larin S. Date of Service: 10/31/2017 10:15 AM Medical Record Number: [id number] Patient Account Number: [id number] Date of Birth/Gender: Jun 17, 1924 (84 y.o. M) Treating RN: Montey Hora Primary Care Physician: Deborra Medina Other Clinician: Referring Physician: Deborra Medina Treating Physician/Extender: Sharalyn Ink in Treatment: 3 Education Assessment Education Provided To: Patient Education Topics Provided Venous: Handouts: Other: impaired blood flow Methods: Explain/Verbal Responses: State content correctly Electronic Signature(s) Signed: 10/31/2017 5:07:50 PM  By: Montey Hora Entered By: Montey Hora on 10/31/2017 10:55:09 Torpey, [name]  ([id number]) -------------------------------------------------------------------------------- Wound Assessment Details Patient Name: Lenk, Rueben S. Date of Service: 10/31/2017 10:15 AM Medical Record Number: [id number] Patient Account Number: [id number] Date of Birth/Sex: [birth year] (84 y.o. M) Treating RN: Roger Shelter Primary Care Shadie Sweatman: Deborra Medina Other Clinician: Referring Davyon Fisch: Deborra Medina Treating Kaydince Towles/Extender: Melburn Hake, HOYT Weeks in Treatment: 3 Wound Status Wound Number: 1 Primary Diabetic Wound/Ulcer of the Lower Extremity Etiology: Wound Location: Right Toe Second Secondary Arterial Insufficiency Ulcer Wounding Event: Gradually Appeared Etiology: Date Acquired: 06/04/2017 Wound Open Weeks Of Treatment: 3 Status: Clustered Wound: No Comorbid Cataracts, Lymphedema, Coronary Artery Pending Amputation On Presentation History: Disease, Hypertension, Peripheral Venous Disease, Type II Diabetes, End Stage Renal Disease, Rheumatoid Arthritis, Neuropathy Wound Measurements Length: (cm) 0.6 Width: (cm) 0.7 Depth: (cm) 0.1 Area: (cm) 0.33 Volume: (cm) 0.033 % Reduction in Area: 72% % Reduction in Volume: 86% Epithelialization: None Tunneling: No Undermining: No Wound Description Classification: Grade 2 Wound Margin: Distinct, outline attached Exudate Amount: Small Exudate Type: Serous Exudate Color: amber Foul Odor After Cleansing: No Slough/Fibrino Yes Wound Bed Granulation Amount: Medium (34-66%) Exposed Structure Granulation Quality: Red, Pink Fascia Exposed: No Necrotic Amount: Medium (34-66%) Fat Layer (Subcutaneous Tissue) Exposed: Yes Necrotic Quality: Eschar, Adherent Slough Tendon Exposed: No Muscle Exposed: No Joint Exposed: No Bone Exposed: No Periwound Skin Texture Texture Color No Abnormalities Noted: No No Abnormalities Noted: No Callus: No Atrophie Blanche: No Crepitus: No Cyanosis: No Excoriation:  No Ecchymosis: No Induration: No Erythema: No Rash: No Hemosiderin Staining: No Scarring: No Mottled: No Pallor: No Moisture Rubor: No Heinkel, Ayub S. ([id number]) No Abnormalities Noted: No Temperature / Pain Dry / Scaly: No Temperature: No Abnormality Maceration: Yes Wound Preparation Ulcer Cleansing: Rinsed/Irrigated with Saline Topical Anesthetic Applied: Other: lidocaince 4%, Electronic Signature(s) Signed: 10/31/2017 4:22:48 PM By: Roger Shelter Entered By: Roger Shelter on 10/31/2017 10:30:17 Mcneil, [name] ([id number]) -------------------------------------------------------------------------------- Wound Assessment Details Patient Name: Flammia, Dwan S. Date of Service: 10/31/2017 10:15 AM Medical Record Number: [id number] Patient Account Number: [id number] Date of Birth/Sex: Oct 13, 1924 (84 y.o. M) Treating RN: Roger Shelter Primary Care Chele Cornell: Deborra Medina Other Clinician: Referring Camira Geidel: Deborra Medina Treating Shakyla Nolley/Extender: Melburn Hake, HOYT Weeks in Treatment: 3 Wound Status Wound Number: 2 Primary Etiology: Diabetic Wound/Ulcer of the Lower Extremity Wound Location: Left Toe Third Secondary Arterial Insufficiency Ulcer Wounding Event: Gradually Appeared Etiology: Date Acquired: 06/04/2017 Wound Status: Amputation Weeks Of Treatment: 3 Outcome Level: Toe/Ray Clustered Wound: No Pending Amputation On Presentation Photos Photo Uploaded By: Roger Shelter on 10/31/2017 16:14:55 Wound Measurements Length: (cm) 0 % Width: (cm) 0 % Depth: (cm) 0 Area: (cm) 0 Volume: (cm) 0 Reduction in Area: 100% Reduction in Volume: 100% Wound Description Classification: Grade 2 Periwound Skin Texture Texture Color No Abnormalities Noted: No No Abnormalities Noted: No Moisture No Abnormalities Noted: No Electronic Signature(s) Signed: 10/31/2017 4:22:48 PM By: Roger Shelter Entered By: Roger Shelter on 10/31/2017  10:29:25 Kell, [name] ([id number]) -------------------------------------------------------------------------------- [place] Details Patient Name: Crumpler, Amiir S. Date of Service: 10/31/2017 10:15 AM Medical Record Number: [id number] Patient Account Number: [id number] Date of Birth/Sex: 01-26-1925 (84 y.o. M) Treating RN: Montey Hora Primary Care Dionel Archey: Deborra Medina Other Clinician: Referring Andray Assefa: Deborra Medina Treating Tiawanna Luchsinger/Extender: Melburn Hake, HOYT Weeks in Treatment: 3 Vital Signs Time Taken: 10:23 Temperature (F): 98.3 Height (in): 68 Pulse (bpm): 70 Weight (lbs): 180 Respiratory Rate (breaths/min): 18 Body Mass Index (BMI): 27.4 Blood Pressure (mmHg): 132/52 Reference Range: 80 -  120 mg / dl Electronic Signature(s) Signed: 10/31/2017 11:52:00 AM By: Lorine Bears RCP, RRT, CHT Entered By: Lorine Bears on 10/31/2017 10:26:06

## 2017-11-03 ENCOUNTER — Telehealth: Payer: Self-pay | Admitting: Internal Medicine

## 2017-11-03 DIAGNOSIS — I10 Essential (primary) hypertension: Secondary | ICD-10-CM | POA: Diagnosis not present

## 2017-11-03 DIAGNOSIS — Z794 Long term (current) use of insulin: Secondary | ICD-10-CM | POA: Diagnosis not present

## 2017-11-03 DIAGNOSIS — E11621 Type 2 diabetes mellitus with foot ulcer: Secondary | ICD-10-CM | POA: Diagnosis not present

## 2017-11-03 DIAGNOSIS — I70203 Unspecified atherosclerosis of native arteries of extremities, bilateral legs: Secondary | ICD-10-CM | POA: Diagnosis not present

## 2017-11-03 DIAGNOSIS — M79604 Pain in right leg: Secondary | ICD-10-CM | POA: Diagnosis not present

## 2017-11-03 DIAGNOSIS — L97513 Non-pressure chronic ulcer of other part of right foot with necrosis of muscle: Secondary | ICD-10-CM | POA: Diagnosis not present

## 2017-11-03 NOTE — Telephone Encounter (Signed)
Copied from [place] [id number]. Topic: Inquiry >> Nov 03, 2017 11:27 AM Oliver Pila B wrote: Reason for CRM: RN from palliative medicine from [facility] called for a recommendation of medication for treatment of pain; discontinue gabapentin; triturating amitriptyline 10mg  at bedtime and titrate to 20mg  for 2weeks then re-access  [phone]

## 2017-11-03 NOTE — Telephone Encounter (Signed)
rx recommendations.

## 2017-11-04 DIAGNOSIS — E11621 Type 2 diabetes mellitus with foot ulcer: Secondary | ICD-10-CM | POA: Diagnosis not present

## 2017-11-04 DIAGNOSIS — L97513 Non-pressure chronic ulcer of other part of right foot with necrosis of muscle: Secondary | ICD-10-CM | POA: Diagnosis not present

## 2017-11-04 DIAGNOSIS — Z794 Long term (current) use of insulin: Secondary | ICD-10-CM | POA: Diagnosis not present

## 2017-11-04 DIAGNOSIS — I10 Essential (primary) hypertension: Secondary | ICD-10-CM | POA: Diagnosis not present

## 2017-11-04 DIAGNOSIS — M79604 Pain in right leg: Secondary | ICD-10-CM | POA: Diagnosis not present

## 2017-11-04 DIAGNOSIS — I70203 Unspecified atherosclerosis of native arteries of extremities, bilateral legs: Secondary | ICD-10-CM | POA: Diagnosis not present

## 2017-11-04 NOTE — Telephone Encounter (Signed)
Is it okay to give verbal orders for medication change?

## 2017-11-04 NOTE — Progress Notes (Signed)
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 although MRI was suggested for further evaluation if possible. Nonetheless with the patient's pacemaker I'm not sure that is possible. That is why we obviously went toward doing the CT scan in the first place. The patient also did have an angiogram performed by Dr. dew on 10/20/17. Initially it appeared that though the patient had heavily calcified vessels the majority of his vascular appear to be fairly good in regard to flow with no greater than 20 to 30% stenosis. There was severe tibial disease the posterior tibial artery not seen in all. This was occlusion shortly be on the origin. According to the note there was no good target for revascularization. The peroneal artery was the only run off distally and in the mid segment there was a short conclusion that was highly calcific. The patient had balloon angioplasty in the mid- peroneal artery. Completion imaging showed brisk flow in the peroneal artery with less than 20% residual stenosis although he still has significant small  vessel disease and the foot and ankle that is stated to not be amendable to any further therapy. The procedure was therefore electrically terminated. At this point unfortunately the patient's toe on the left foot, third toe, actually appears to be doing significantly worse. It's cool to touch, cyanotic, and does seem to be progressing in a very poor direction. I think this is going to require amputation. I actually ended up having a conversation with the patient as well as his daughter who was present during evaluation today. Also subsequently ended up talking to Dr. dew concerning the toe and what we're seeing. Dr. dew feels that the patient could still continue with the angiogram of the right lower extremity this coming Monday and they are going to see about potentially getting him set up for amputation of the third toe possibly even this coming Wednesday. He's gonna check with the scheduler. Nonetheless that we detailed in greater detail in the plan. 10/31/17 on evaluation today patient presents following having had his right angiogram which was performed on Monday 10/27/17. 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 The anterior tibial artery had a short segment occlusion approximately then occluded distally just above the ankle without contributing much fluid into the foot. He had  significant microvascular disease not amenable to endovascular therapy. The anterior tibial artery was treated with balloon angioplasty with improved flow proximally and less than 30% residual stenosis but continued occlusion distally. The peroneal artery did not require any intervention. It was felt by Dr. dew that there was nothing further that could be done from a endovascular standpoint for the patient has the majority of the disease with small vessel microvascular disease in the fluid. The patient also had an amputation of the left third toe and metatarsal  head which was performed on 10/29/17 also by Dr. dew. Apparently the patient progress well for the surgery without complication and was discharged home in stable condition. There does not appear to be any evidence of infection at this time. Regard to his ulcer on the right third toe things seem to be doing fairly well today I think he is tolerating the center well there was a little bit of callous buildup over the distal portion of the toe. Patient History Information obtained from Patient. Social History Former smoker, Marital Status - Widowed, Alcohol Use - Rarely, Drug Use - No History, Caffeine Use - Never. Medical And Surgical History Notes Cardiovascular pacemaker, 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 leg, stent left leg Review of Systems (ROS) Constitutional Symptoms (General Health) Denies complaints or symptoms of Fever, Chills. Respiratory The patient has no complaints or symptoms. Cardiovascular [name] [name] MUCHOW ([id number]) The patient has no complaints or symptoms. Psychiatric The patient has no complaints or symptoms. Objective Constitutional Well-nourished and well-hydrated in no acute distress. Vitals Time Taken: 10:23 AM, Height: 68 in, Weight: 180 lbs, BMI: 27.4, Temperature: 98.3 F, Pulse: 70 bpm, Respiratory Rate: 18 breaths/min, Blood Pressure: 132/52 mmHg. Respiratory normal breathing without difficulty. Psychiatric this patient is able to make decisions and demonstrates good insight into disease process. Alert and Oriented x 3. pleasant and cooperative. General Notes: On inspection today patient's wound bed did have some Slough noted on the surface of the wound although due to his microvascular status and the fact that he has poor microvascular flow and arterial flow in general I am not willing to perform significant sharp debridement at this point. With that being said I did perform light debridement of the callous surrounding and attempted some removal of slough on  the surface of the wound although that was fairly minimal at this point. Post debridement the wound bed appears a little better but again I mainly gonna let the Santyl continue to work at this time. Integumentary (Hair, Skin) Wound #1 status is Open. Original cause of wound was Gradually Appeared. The wound is located on the Right Toe Second. The wound measures 0.6cm length x 0.7cm width x 0.1cm depth; 0.33cm^2 area and 0.033cm^3 volume. There is Fat Layer (Subcutaneous Tissue) Exposed exposed. There is no tunneling or undermining noted. There is a small amount of serous drainage noted. The wound margin is distinct with the outline attached to the wound base. There is medium (34-66%) red, pink granulation within the wound bed. There is a medium (34-66%) amount of necrotic tissue within the wound bed including Eschar and Adherent Slough. The periwound skin appearance exhibited: Maceration. The periwound skin appearance did not exhibit: Callus, Crepitus, Excoriation, Induration, Rash, Scarring, Dry/Scaly, Atrophie Blanche, Cyanosis, Ecchymosis, Hemosiderin Staining, Mottled, Pallor, Rubor, Erythema. Periwound temperature was noted as No Abnormality. Wound #2 status is Amputation. Original cause of wound was Gradually Appeared. The wound is located on the Left Toe Third. The wound measures 0cm length x 0cm width x  0cm depth; 0cm^2 area and 0cm^3 volume. Assessment Active Problems ICD-10 Type 2 diabetes mellitus with foot ulcer Type 2 diabetes mellitus with diabetic neuropathy, unspecified Flis, [place] S. 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 There was a Selective/Open Wound Non-Viable Tissue Debridement with a total area of 0.42 sq cm performed by STONE III, Ivanell Deshotel E., PA-C. With the following instrument(s): Curette to remove Non-Viable tissue/material. Material removed includes Callus and Slough and after achieving pain control using Lidocaine 4% Topical Solution. No specimens were taken. A time out was conducted at 10:51, prior to the start of the procedure. There was no bleeding. The procedure was tolerated well with a pain level of 0 throughout and a pain level of 0 following the procedure. Post Debridement Measurements: 0.6cm length x 0.7cm width x 0.1cm depth; 0.033cm^3 volume. Character of Wound/Ulcer Post Debridement is improved. Severity of Tissue Post Debridement is: Fat layer exposed. Post procedure Diagnosis Wound #1: Same as Pre-Procedure Plan Wound Cleansing: Wound #1 Right Toe Second: Clean wound with Normal Saline. May Shower, gently pat wound dry prior to applying new dressing. Anesthetic (add to Medication List): Wound #1 Right Toe Second: Topical Lidocaine 4% cream applied to wound bed prior to debridement (In Clinic Only). Primary Wound Dressing: Wound #1 Right Toe Second: Santyl Ointment Secondary Dressing: Wound #1 Right Toe Second: Gauze and Kerlix/Conform Dressing Change Frequency: Wound #1 Right Toe Second: Change dressing every day. - HHRN to change dressing three times weekly and [facility] will change dressing once and family will attempt to change dressing the other days Follow-up Appointments: Wound #1 Right Toe Second: Return Appointment in 1 week. Home Health: Wound #1 Right Toe Second: [facility] Nurse may visit PRN to address patient s wound care needs. FACE TO FACE ENCOUNTER:  MEDICARE and MEDICAID PATIENTS: I certify that this patient is under my care and that I had a face-to-face encounter that meets the physician face-to-face encounter requirements with this patient on this date. The Carlile, PAUL TRETTIN ([id number]) encounter with the patient was in whole or in part for the following MEDICAL CONDITION: (primary reason for Garrett) MEDICAL NECESSITY: I certify, that based on my findings, NURSING services are a medically necessary home health service. HOME BOUND STATUS: I certify that my clinical findings support that this patient is homebound (i.e., Due to illness or injury, pt requires aid of supportive devices such as crutches, cane, wheelchairs, walkers, the use of special transportation or the assistance of another person to leave their place of residence. There is a normal inability to leave the home and doing so requires considerable and taxing effort. Other absences are for medical reasons / religious services and are infrequent or of short duration when for other reasons). If current dressing causes regression in wound condition, may D/C ordered dressing product/s and apply Normal Saline Moist Dressing daily until next [place] / Other MD appointment. Salvo of regression in wound condition at [phone]. Please direct any NON-WOUND related issues/requests for orders to patient's Primary Care  Physician Medications-please add to medication list.: Wound #1 Right Toe Second: Santyl Enzymatic Ointment Everything that was done today during the visit was discussed with the patient's daughter, Kathlee Nations, this afternoon as well in order to let her know what was going on and that nothing had really changed. I explained that I did not look at the left foot specifically the amputation site as this is something that Dr. dew is managing and therefore we really do not get involved in that regard. She understands. We will subsequently see  the patient back for follow-up evaluation one weeks time to see were things stand if anything changes in the interim they will let us know. Please see above for specific wound care orders. We will see patient for re-evaluation in 1 week(s) here in the clinic. If anything worsens or changes patient will contact our office for additional recommendations. Electronic Signature(s) Signed: 10/31/2017 5:13:23 PM By: Worthy Keeler PA-C Entered By: Worthy Keeler on 10/31/2017 17:12:19 [name] [name] Fillers ([id number]) -------------------------------------------------------------------------------- ROS/PFSH Details Patient Name: [name] [name] S. Date of Service: 10/31/2017 10:15 AM Medical Record Number: [id number] Patient Account Number: [id number] Date of Birth/Sex: August 22, 1924 (84 y.o. M) Treating RN: Montey Hora Primary Care Provider: Deborra Medina Other Clinician: Referring Provider: Deborra Medina Treating Provider/Extender: Melburn Hake, Jamonica Schoff Weeks in Treatment: 3 Information Obtained From Patient Wound History Constitutional Symptoms (General Health) Complaints and Symptoms: Negative for: Fever; Chills Eyes Medical History: Positive for: Cataracts Negative for: Glaucoma; Optic Neuritis Ear/Nose/Mouth/Throat Medical History: Negative for: Chronic sinus problems/congestion; Middle ear problems Hematologic/Lymphatic Medical History: Positive for: Lymphedema Negative for: Anemia; Hemophilia; Human Immunodeficiency Virus; Sickle Cell Disease Respiratory Complaints and Symptoms: No Complaints or Symptoms Medical History: Negative for: Aspiration; Asthma; Chronic Obstructive Pulmonary Disease (COPD); Pneumothorax; Sleep Apnea; Tuberculosis Cardiovascular Complaints and Symptoms: No Complaints or Symptoms Medical History: Positive for: Coronary Artery Disease; Hypertension; Peripheral Venous Disease Negative for: Angina; Arrhythmia; Congestive Heart Failure; Deep Vein Thrombosis;  Hypotension; Myocardial Infarction; Peripheral Arterial Disease; Phlebitis; Vasculitis Past Medical History Notes: pacemaker, 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 leg, stent left leg Gastrointestinal [name] [name] S. ([id number]) Medical History: Negative for: Cirrhosis ; Colitis; Crohnos; Hepatitis A; Hepatitis B; Hepatitis C Endocrine Medical History: Positive for: Type II Diabetes - since 1988 Negative for: Type I Diabetes Treated with: Insulin Blood sugar tested every day: Yes Tested : 3 times daily Blood sugar testing results: Breakfast: 114 Genitourinary Medical History: Positive for: End Stage Renal Disease Immunological Medical History: Negative for: Lupus Erythematosus; Raynaudos; Scleroderma Integumentary (Skin) Medical History: Negative for: History of Burn; History of pressure wounds Musculoskeletal Medical History: Positive for: Rheumatoid Arthritis Negative for: Gout; Osteoarthritis; Osteomyelitis Neurologic Medical History: Positive for: Neuropathy - feet and legs Negative for: Dementia; Quadriplegia; Paraplegia; Seizure Disorder Psychiatric Complaints and Symptoms: No Complaints or Symptoms Medical History: Negative for: Anorexia/bulimia; Confinement Anxiety HBO Extended History Items Eyes: Cataracts Immunizations Pneumococcal Vaccine: Received Pneumococcal Vaccination: No Implantable Devices Sanmiguel, [name] [name] ([id number]) Family and Social History Former smoker; Marital Status - Widowed; Alcohol Use: Rarely; Drug Use: No History; Caffeine Use: Never; Financial Concerns: No; Food, Clothing or Shelter Needs: No; Support System Lacking: No; Transportation Concerns: No; Advanced Directives: Yes (Not Provided); Patient does not want information on Advanced Directives; Do not resuscitate: No; Living Will: Yes (Not Provided); Medical Power of Attorney: Yes (Not Provided) Physician Affirmation I have reviewed and agree with the above information. Electronic  Signature(s) Signed: 10/31/2017 5:13:23 PM By: Worthy Keeler PA-C Signed: 11/03/2017 5:23:17 PM By: Montey Hora  Entered By: Worthy Keeler on 10/31/2017 17:11:09 Star, [name] Fillers ([id number]) -------------------------------------------------------------------------------- [place] Details Patient Name: Wickens, Kristian S. Date of Service: 10/31/2017 Medical Record Number: [id number] Patient Account Number: [id number] Date of Birth/Sex: [birth year] (84 y.o. M) Treating RN: Montey Hora Primary Care Provider: Deborra Medina Other Clinician: Referring Provider: Deborra Medina Treating Provider/Extender: Melburn Hake, Danasia Baker Weeks in Treatment: 3 Diagnosis Coding ICD-10 Codes Code Description [phone] Type 2 diabetes mellitus with foot ulcer E11.40 Type 2 diabetes mellitus with diabetic neuropathy, unspecified L97.512 Non-pressure chronic ulcer of other part of right foot with fat layer exposed 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 N18.9 Chronic kidney disease, unspecified I73.9 Peripheral vascular disease, unspecified H20.94 Chronic systolic (congestive) heart failure I10 Essential (primary) hypertension Facility Procedures CPT4 Code: [id number] Description: [phone] - DEBRIDE WOUND 1ST 20 SQ CM OR < ICD-10 Diagnosis Description L97.512 Non-pressure chronic ulcer of other part of right foot with fat Modifier: layer exposed Quantity: 1 Physician Procedures CPT4: Description Modifier Quantity Code [id number] - WC PHYS LEVEL 4 - EST PT 25 1 ICD-10 Diagnosis Description E11.621 Type 2 diabetes mellitus with foot ulcer E11.40 Type 2 diabetes mellitus with diabetic neuropathy, unspecified L97.512  Non-pressure chronic ulcer of other part of right foot with fat layer exposed 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 CPT4: [id number] - WC PHYS DEBR WO  ANESTH 20 SQ CM 1 ICD-10 Diagnosis Description L97.512 Non-pressure chronic ulcer of other part of right foot with fat layer exposed Electronic Signature(s) Signed: 10/31/2017 5:13:23 PM By: Worthy Keeler PA-C Entered By: Worthy Keeler on 10/31/2017 17:12:45

## 2017-11-04 NOTE — Telephone Encounter (Signed)
Yes. Thanks 

## 2017-11-05 DIAGNOSIS — I495 Sick sinus syndrome: Secondary | ICD-10-CM | POA: Diagnosis not present

## 2017-11-05 DIAGNOSIS — G629 Polyneuropathy, unspecified: Secondary | ICD-10-CM | POA: Diagnosis not present

## 2017-11-06 DIAGNOSIS — L97513 Non-pressure chronic ulcer of other part of right foot with necrosis of muscle: Secondary | ICD-10-CM | POA: Diagnosis not present

## 2017-11-06 DIAGNOSIS — I70203 Unspecified atherosclerosis of native arteries of extremities, bilateral legs: Secondary | ICD-10-CM | POA: Diagnosis not present

## 2017-11-06 DIAGNOSIS — M79604 Pain in right leg: Secondary | ICD-10-CM | POA: Diagnosis not present

## 2017-11-06 DIAGNOSIS — Z794 Long term (current) use of insulin: Secondary | ICD-10-CM | POA: Diagnosis not present

## 2017-11-06 DIAGNOSIS — I10 Essential (primary) hypertension: Secondary | ICD-10-CM | POA: Diagnosis not present

## 2017-11-06 DIAGNOSIS — E11621 Type 2 diabetes mellitus with foot ulcer: Secondary | ICD-10-CM | POA: Diagnosis not present

## 2017-11-06 NOTE — Telephone Encounter (Signed)
LMTCB. Please transfer to our office.

## 2017-11-07 ENCOUNTER — Other Ambulatory Visit: Payer: Self-pay

## 2017-11-07 ENCOUNTER — Encounter: Payer: Medicare Other | Attending: Physician Assistant | Admitting: Physician Assistant

## 2017-11-07 ENCOUNTER — Ambulatory Visit (INDEPENDENT_AMBULATORY_CARE_PROVIDER_SITE_OTHER): Payer: Medicare Other | Admitting: Vascular Surgery

## 2017-11-07 ENCOUNTER — Encounter: Payer: Self-pay | Admitting: *Deleted

## 2017-11-07 ENCOUNTER — Encounter: Payer: Medicare Other | Admitting: Physician Assistant

## 2017-11-07 ENCOUNTER — Emergency Department: Payer: Medicare Other

## 2017-11-07 ENCOUNTER — Inpatient Hospital Stay
Admission: EM | Admit: 2017-11-07 | Discharge: 2017-11-11 | DRG: 300 | Disposition: A | Discharge status: Home Health | Payer: Medicare Other | Source: Ambulatory Visit | Attending: Internal Medicine | Admitting: Internal Medicine

## 2017-11-07 DIAGNOSIS — I872 Venous insufficiency (chronic) (peripheral): Secondary | ICD-10-CM | POA: Diagnosis not present

## 2017-11-07 DIAGNOSIS — M1990 Unspecified osteoarthritis, unspecified site: Secondary | ICD-10-CM | POA: Diagnosis present

## 2017-11-07 DIAGNOSIS — Z23 Encounter for immunization: Secondary | ICD-10-CM

## 2017-11-07 DIAGNOSIS — Z85828 Personal history of other malignant neoplasm of skin: Secondary | ICD-10-CM | POA: Diagnosis not present

## 2017-11-07 DIAGNOSIS — N183 Chronic kidney disease, stage 3 (moderate): Secondary | ICD-10-CM | POA: Diagnosis present

## 2017-11-07 DIAGNOSIS — L97512 Non-pressure chronic ulcer of other part of right foot with fat layer exposed: Secondary | ICD-10-CM | POA: Diagnosis not present

## 2017-11-07 DIAGNOSIS — Z8249 Family history of ischemic heart disease and other diseases of the circulatory system: Secondary | ICD-10-CM | POA: Diagnosis not present

## 2017-11-07 DIAGNOSIS — Z79891 Long term (current) use of opiate analgesic: Secondary | ICD-10-CM | POA: Diagnosis not present

## 2017-11-07 DIAGNOSIS — Z66 Do not resuscitate: Secondary | ICD-10-CM | POA: Diagnosis present

## 2017-11-07 DIAGNOSIS — Z89422 Acquired absence of other left toe(s): Secondary | ICD-10-CM

## 2017-11-07 DIAGNOSIS — I998 Other disorder of circulatory system: Secondary | ICD-10-CM

## 2017-11-07 DIAGNOSIS — Z79899 Other long term (current) drug therapy: Secondary | ICD-10-CM | POA: Diagnosis not present

## 2017-11-07 DIAGNOSIS — G4700 Insomnia, unspecified: Secondary | ICD-10-CM | POA: Diagnosis present

## 2017-11-07 DIAGNOSIS — Z950 Presence of cardiac pacemaker: Secondary | ICD-10-CM

## 2017-11-07 DIAGNOSIS — I129 Hypertensive chronic kidney disease with stage 1 through stage 4 chronic kidney disease, or unspecified chronic kidney disease: Secondary | ICD-10-CM | POA: Diagnosis present

## 2017-11-07 DIAGNOSIS — M25476 Effusion, unspecified foot: Secondary | ICD-10-CM | POA: Diagnosis not present

## 2017-11-07 DIAGNOSIS — Z87891 Personal history of nicotine dependence: Secondary | ICD-10-CM | POA: Diagnosis not present

## 2017-11-07 DIAGNOSIS — I2510 Atherosclerotic heart disease of native coronary artery without angina pectoris: Secondary | ICD-10-CM | POA: Diagnosis present

## 2017-11-07 DIAGNOSIS — E1151 Type 2 diabetes mellitus with diabetic peripheral angiopathy without gangrene: Principal | ICD-10-CM | POA: Diagnosis present

## 2017-11-07 DIAGNOSIS — Z951 Presence of aortocoronary bypass graft: Secondary | ICD-10-CM

## 2017-11-07 DIAGNOSIS — Z7902 Long term (current) use of antithrombotics/antiplatelets: Secondary | ICD-10-CM

## 2017-11-07 DIAGNOSIS — Z833 Family history of diabetes mellitus: Secondary | ICD-10-CM

## 2017-11-07 DIAGNOSIS — Z820 Family history of epilepsy and other diseases of the nervous system: Secondary | ICD-10-CM

## 2017-11-07 DIAGNOSIS — Z95820 Peripheral vascular angioplasty status with implants and grafts: Secondary | ICD-10-CM

## 2017-11-07 DIAGNOSIS — I77811 Abdominal aortic ectasia: Secondary | ICD-10-CM | POA: Diagnosis not present

## 2017-11-07 DIAGNOSIS — Z885 Allergy status to narcotic agent status: Secondary | ICD-10-CM

## 2017-11-07 DIAGNOSIS — Z794 Long term (current) use of insulin: Secondary | ICD-10-CM | POA: Diagnosis not present

## 2017-11-07 DIAGNOSIS — Z7982 Long term (current) use of aspirin: Secondary | ICD-10-CM

## 2017-11-07 DIAGNOSIS — E1122 Type 2 diabetes mellitus with diabetic chronic kidney disease: Secondary | ICD-10-CM | POA: Diagnosis present

## 2017-11-07 DIAGNOSIS — Z8613 Personal history of malaria: Secondary | ICD-10-CM

## 2017-11-07 DIAGNOSIS — E11621 Type 2 diabetes mellitus with foot ulcer: Secondary | ICD-10-CM | POA: Diagnosis not present

## 2017-11-07 DIAGNOSIS — E119 Type 2 diabetes mellitus without complications: Secondary | ICD-10-CM | POA: Diagnosis not present

## 2017-11-07 DIAGNOSIS — L97929 Non-pressure chronic ulcer of unspecified part of left lower leg with unspecified severity: Secondary | ICD-10-CM | POA: Diagnosis not present

## 2017-11-07 DIAGNOSIS — I739 Peripheral vascular disease, unspecified: Secondary | ICD-10-CM | POA: Diagnosis not present

## 2017-11-07 DIAGNOSIS — Z818 Family history of other mental and behavioral disorders: Secondary | ICD-10-CM

## 2017-11-07 DIAGNOSIS — E785 Hyperlipidemia, unspecified: Secondary | ICD-10-CM | POA: Diagnosis present

## 2017-11-07 DIAGNOSIS — L03116 Cellulitis of left lower limb: Secondary | ICD-10-CM | POA: Diagnosis present

## 2017-11-07 DIAGNOSIS — M869 Osteomyelitis, unspecified: Secondary | ICD-10-CM | POA: Diagnosis not present

## 2017-11-07 LAB — CBC WITH DIFFERENTIAL/PLATELET
BASOS ABS: 0.1 10*3/uL (ref 0–0.1)
BASOS PCT: 1 %
Eosinophils Absolute: 0.7 10*3/uL (ref 0–0.7)
Eosinophils Relative: 8 %
HEMATOCRIT: 30.5 % — AB (ref 40.0–52.0)
HEMOGLOBIN: 9.7 g/dL — AB (ref 13.0–18.0)
Lymphocytes Relative: 11 %
Lymphs Abs: 1 10*3/uL (ref 1.0–3.6)
MCH: 30.7 pg (ref 26.0–34.0)
MCHC: 31.8 g/dL — AB (ref 32.0–36.0)
MCV: 96.6 fL (ref 80.0–100.0)
Monocytes Absolute: 0.8 10*3/uL (ref 0.2–1.0)
Monocytes Relative: 8 %
NEUTROS PCT: 72 %
Neutro Abs: 6.7 10*3/uL — ABNORMAL HIGH (ref 1.4–6.5)
Platelets: 294 10*3/uL (ref 150–440)
RBC: 3.15 MIL/uL — AB (ref 4.40–5.90)
RDW: 15.2 % — ABNORMAL HIGH (ref 11.5–14.5)
WBC: 9.3 10*3/uL (ref 3.8–10.6)

## 2017-11-07 LAB — COMPREHENSIVE METABOLIC PANEL
ALK PHOS: 59 U/L (ref 38–126)
ALT: 13 U/L (ref 0–44)
AST: 16 U/L (ref 15–41)
Albumin: 3.5 g/dL (ref 3.5–5.0)
Anion gap: 7 (ref 5–15)
BILIRUBIN TOTAL: 0.4 mg/dL (ref 0.3–1.2)
BUN: 48 mg/dL — ABNORMAL HIGH (ref 8–23)
CALCIUM: 9.3 mg/dL (ref 8.9–10.3)
CO2: 28 mmol/L (ref 22–32)
Chloride: 104 mmol/L (ref 98–111)
Creatinine, Ser: 1.76 mg/dL — ABNORMAL HIGH (ref 0.61–1.24)
GFR calc non Af Amer: 32 mL/min — ABNORMAL LOW (ref >60)
GFR, EST AFRICAN AMERICAN: 37 mL/min — AB (ref >60)
Glucose, Bld: 132 mg/dL — ABNORMAL HIGH (ref 70–99)
Potassium: 5.3 mmol/L — ABNORMAL HIGH (ref 3.5–5.1)
Sodium: 139 mmol/L (ref 135–145)
TOTAL PROTEIN: 6.9 g/dL (ref 6.5–8.1)

## 2017-11-07 LAB — GLUCOSE, CAPILLARY
GLUCOSE-CAPILLARY: 158 mg/dL — AB (ref 70–99)
Glucose-Capillary: 174 mg/dL — ABNORMAL HIGH (ref 70–99)

## 2017-11-07 LAB — PROTIME-INR
INR: 1.11
Prothrombin Time: 14.2 seconds (ref 11.4–15.2)

## 2017-11-07 LAB — CK: CK TOTAL: 30 U/L — AB (ref 49–397)

## 2017-11-07 LAB — SEDIMENTATION RATE: Sed Rate: 56 mm/hr — ABNORMAL HIGH (ref 0–20)

## 2017-11-07 LAB — APTT: aPTT: 32 seconds (ref 24–36)

## 2017-11-07 MED ORDER — INSULIN ASPART PROT & ASPART (70-30 MIX) 100 UNIT/ML ~~LOC~~ SUSP: 34.0000 [IU] | Freq: Two times a day (BID) | SUBCUTANEOUS | Status: DC

## 2017-11-07 MED ORDER — ONDANSETRON HCL 4 MG PO TABS: 4.0000 mg | ORAL_TABLET | Freq: Four times a day (QID) | ORAL | Status: DC | PRN

## 2017-11-07 MED ORDER — HEPARIN BOLUS VIA INFUSION
3900.0000 [IU] | Freq: Once | INTRAVENOUS | Status: AC
  Administered 2017-11-07: 3900 [IU] via INTRAVENOUS
  Filled 2017-11-07: qty 3900

## 2017-11-07 MED ORDER — CLOPIDOGREL BISULFATE 75 MG PO TABS
75.0000 mg | ORAL_TABLET | Freq: Every day | ORAL | Status: DC
  Administered 2017-11-08 – 2017-11-11 (×4): 75 mg via ORAL
  Filled 2017-11-07 (×4): qty 1

## 2017-11-07 MED ORDER — ONDANSETRON HCL 4 MG/2ML IJ SOLN: 4.0000 mg | Freq: Four times a day (QID) | INTRAMUSCULAR | Status: DC | PRN

## 2017-11-07 MED ORDER — TRAMADOL HCL 50 MG PO TABS
50.0000 mg | ORAL_TABLET | Freq: Two times a day (BID) | ORAL | Status: DC
  Administered 2017-11-07 – 2017-11-11 (×7): 50 mg via ORAL
  Filled 2017-11-07 (×8): qty 1

## 2017-11-07 MED ORDER — INFLUENZA VAC SPLIT HIGH-DOSE 0.5 ML IM SUSY
0.5000 mL | PREFILLED_SYRINGE | INTRAMUSCULAR | Status: AC
  Administered 2017-11-08: 0.5 mL via INTRAMUSCULAR
  Filled 2017-11-07: qty 0.5

## 2017-11-07 MED ORDER — ASPIRIN EC 325 MG PO TBEC
325.0000 mg | DELAYED_RELEASE_TABLET | Freq: Every evening | ORAL | Status: DC
  Administered 2017-11-07 – 2017-11-10 (×4): 325 mg via ORAL
  Filled 2017-11-07 (×4): qty 1

## 2017-11-07 MED ORDER — INSULIN ASPART 100 UNIT/ML ~~LOC~~ SOLN
0.0000 [IU] | Freq: Three times a day (TID) | SUBCUTANEOUS | Status: DC
  Administered 2017-11-07 – 2017-11-08 (×2): 2 [IU] via SUBCUTANEOUS
  Administered 2017-11-08 – 2017-11-09 (×3): 1 [IU] via SUBCUTANEOUS
  Administered 2017-11-09 – 2017-11-11 (×5): 2 [IU] via SUBCUTANEOUS
  Filled 2017-11-07 (×9): qty 1

## 2017-11-07 MED ORDER — FUROSEMIDE 40 MG PO TABS
40.0000 mg | ORAL_TABLET | Freq: Every day | ORAL | Status: DC
  Administered 2017-11-08 – 2017-11-11 (×4): 40 mg via ORAL
  Filled 2017-11-07 (×4): qty 1

## 2017-11-07 MED ORDER — PIPERACILLIN-TAZOBACTAM 3.375 G IVPB
3.3750 g | Freq: Three times a day (TID) | INTRAVENOUS | Status: DC
  Administered 2017-11-08 (×2): 3.375 g via INTRAVENOUS
  Filled 2017-11-07 (×2): qty 50

## 2017-11-07 MED ORDER — VANCOMYCIN HCL IN DEXTROSE 1-5 GM/200ML-% IV SOLN
1000.0000 mg | INTRAVENOUS | Status: DC
  Administered 2017-11-08 (×2): 1000 mg via INTRAVENOUS
  Filled 2017-11-07 (×3): qty 200

## 2017-11-07 MED ORDER — ACETAMINOPHEN 325 MG PO TABS: 650.0000 mg | ORAL_TABLET | Freq: Four times a day (QID) | ORAL | Status: DC | PRN

## 2017-11-07 MED ORDER — LOSARTAN POTASSIUM 50 MG PO TABS
100.0000 mg | ORAL_TABLET | ORAL | Status: DC
  Administered 2017-11-08 – 2017-11-11 (×4): 100 mg via ORAL
  Filled 2017-11-07 (×4): qty 2

## 2017-11-07 MED ORDER — AMLODIPINE BESYLATE 10 MG PO TABS
10.0000 mg | ORAL_TABLET | ORAL | Status: DC
  Administered 2017-11-08 – 2017-11-11 (×4): 10 mg via ORAL
  Filled 2017-11-07 (×4): qty 1

## 2017-11-07 MED ORDER — PIPERACILLIN-TAZOBACTAM 3.375 G IVPB 30 MIN
3.3750 g | Freq: Once | INTRAVENOUS | Status: AC
  Administered 2017-11-07: 3.375 g via INTRAVENOUS
  Filled 2017-11-07: qty 50

## 2017-11-07 MED ORDER — ACETAMINOPHEN 650 MG RE SUPP: 650.0000 mg | Freq: Four times a day (QID) | RECTAL | Status: DC | PRN

## 2017-11-07 MED ORDER — VANCOMYCIN HCL IN DEXTROSE 1-5 GM/200ML-% IV SOLN
1000.0000 mg | Freq: Once | INTRAVENOUS | Status: AC
  Administered 2017-11-07: 1000 mg via INTRAVENOUS
  Filled 2017-11-07: qty 200

## 2017-11-07 MED ORDER — INSULIN ASPART 100 UNIT/ML ~~LOC~~ SOLN
SUBCUTANEOUS | Status: AC
  Administered 2017-11-07: 2 [IU] via SUBCUTANEOUS
  Filled 2017-11-07: qty 1

## 2017-11-07 MED ORDER — ATORVASTATIN CALCIUM 20 MG PO TABS
20.0000 mg | ORAL_TABLET | Freq: Every day | ORAL | Status: DC
  Administered 2017-11-08 – 2017-11-10 (×3): 20 mg via ORAL
  Filled 2017-11-07 (×3): qty 1

## 2017-11-07 MED ORDER — HEPARIN (PORCINE) IN NACL 100-0.45 UNIT/ML-% IJ SOLN
1250.0000 [IU]/h | INTRAMUSCULAR | Status: DC
  Administered 2017-11-07 – 2017-11-10 (×4): 1250 [IU]/h via INTRAVENOUS
  Filled 2017-11-07 (×5): qty 250

## 2017-11-07 MED ORDER — ALBUTEROL SULFATE (2.5 MG/3ML) 0.083% IN NEBU: 2.5000 mg | INHALATION_SOLUTION | RESPIRATORY_TRACT | Status: DC | PRN

## 2017-11-07 MED ORDER — POLYETHYLENE GLYCOL 3350 17 G PO PACK
17.0000 g | PACK | Freq: Every day | ORAL | Status: DC | PRN
  Administered 2017-11-08: 17 g via ORAL
  Filled 2017-11-07: qty 1

## 2017-11-07 MED ORDER — METOPROLOL TARTRATE 50 MG PO TABS
50.0000 mg | ORAL_TABLET | Freq: Two times a day (BID) | ORAL | Status: DC
  Administered 2017-11-07 – 2017-11-11 (×8): 50 mg via ORAL
  Filled 2017-11-07 (×8): qty 1

## 2017-11-07 MED ORDER — SODIUM CHLORIDE 0.9 % IV SOLN
INTRAVENOUS | Status: DC
  Administered 2017-11-10: 01:00:00 via INTRAVENOUS

## 2017-11-07 MED ORDER — HYDROCODONE-ACETAMINOPHEN 10-325 MG PO TABS
1.0000 | ORAL_TABLET | Freq: Every day | ORAL | Status: DC
  Administered 2017-11-07 – 2017-11-10 (×4): 1 via ORAL
  Filled 2017-11-07 (×4): qty 1

## 2017-11-07 MED ORDER — LATANOPROST 0.005 % OP SOLN
1.0000 [drp] | Freq: Every day | OPHTHALMIC | Status: DC
  Administered 2017-11-07 – 2017-11-10 (×4): 1 [drp] via OPHTHALMIC
  Filled 2017-11-07: qty 2.5

## 2017-11-07 MED ORDER — INSULIN ASPART 100 UNIT/ML ~~LOC~~ SOLN: 0.0000 [IU] | Freq: Every day | SUBCUTANEOUS | Status: DC

## 2017-11-07 MED ORDER — DOCUSATE SODIUM 100 MG PO CAPS
100.0000 mg | ORAL_CAPSULE | Freq: Two times a day (BID) | ORAL | Status: DC
  Administered 2017-11-07 – 2017-11-11 (×8): 100 mg via ORAL
  Filled 2017-11-07 (×8): qty 1

## 2017-11-07 MED ORDER — HYDROCODONE-ACETAMINOPHEN 10-325 MG PO TABS
0.5000 | ORAL_TABLET | Freq: Every morning | ORAL | Status: DC
  Administered 2017-11-08 – 2017-11-11 (×4): 0.5 via ORAL
  Filled 2017-11-07 (×4): qty 1

## 2017-11-07 MED ORDER — OXYCODONE HCL 5 MG PO TABS
5.0000 mg | ORAL_TABLET | Freq: Four times a day (QID) | ORAL | Status: DC | PRN
  Administered 2017-11-08 – 2017-11-11 (×4): 5 mg via ORAL
  Filled 2017-11-07 (×4): qty 1

## 2017-11-07 NOTE — ED Provider Notes (Signed)
[facility] Emergency Department Provider Note ____________________________________________   I have reviewed the triage vital signs and the triage nursing note.  HISTORY  Chief Complaint Post-op Problem   Historian Patient and most of the history from the son.  HPI [name] is a 84 y.o. 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  He was seen at the wound center today and the foot looked mottled and concerning for cellulitis and vascular insufficiency and was sent to the ED for further evaluation after discussion with vascular surgery by phone.  No reported new pains.  No fevers.  No chills.  No nausea.    Past Medical History:  Diagnosis Date  . Arthritis   . Blood transfusion   . Cancer ([place])    skin cancer on forehead  . Coronary artery disease    h/o bypass coronary- 2001 & peripheral- 2011 , last full cardiac visit  in [place] , [place], [employer]  . Diabetes mellitus   . H/O: malaria 89   in [place]  . Hyperlipidemia   . Hypertension   . Peripheral artery disease ([place])    folowed by Dew post bypass 2011 [place]  . Renal insufficiency    by Nov 2012 labs, no old records available    Patient Active Problem List   Diagnosis Date Noted  . Bilateral pain of leg and foot 09/04/2017  . Atherosclerosis of native arteries of the extremities with ulceration ([place]) 06/06/2017  . Aortic stenosis 04/12/2017  . Pain in thumb joint with movement of right hand 12/25/2015  . Grief reaction 06/14/2014  . Diabetes mellitus with chronic kidney disease ([place]) 06/14/2014  . Undiagnosed cardiac murmurs 06/13/2014  . CKD (chronic kidney disease) stage 3, GFR 30-59 ml/min (HCC) 12/14/2013  . Edema 11/03/2012  . Do not resuscitate discussion 07/28/2012  . Overweight (BMI  25.0-29.9) 04/27/2012  . Diabetes mellitus with peripheral vascular disease ([place]) 07/03/2011  . Skin lesion of scalp 07/03/2011  . Insomnia 07/03/2011  . 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 PVD (peripheral vascular disease) ([place])   . Hypertension 09/28/2010  . Hyperlipidemia with target LDL less than 70 09/28/2010  . Coronary artery occlusion without or not resulting in myocardial infarction ([place]) 09/28/2010  . Spinal stenosis of lumbar region 09/28/2010  . Screening for colon cancer 09/28/2010  . Diabetic neuropathy associated with type 2 diabetes mellitus ([place]) 09/28/2010  . Peripheral vascular disease due to secondary diabetes ([place]) 09/28/2010    Past Surgical History:  Procedure Laterality Date  . AMPUTATION Left 10/29/2017   Procedure: AMPUTATION RAY ( THIRD TOE );  Surgeon: Algernon Huxley, MD;  Location: [facility] ORS;  Service: Vascular;  Laterality: Left;  . CORONARY ARTERY BYPASS GRAFT  2001   4 vessel, done in [place]   . FEMORAL BYPASS  2011   done in [place],  now followed by [facility]  . INSERT / REPLACE / REMOVE PACEMAKER     2004  . LOWER EXTREMITY ANGIOGRAPHY Left 08/18/2017   Procedure: LOWER EXTREMITY ANGIOGRAPHY;  Surgeon: Algernon Huxley, MD;  Location: [place] [facility];  Service: Cardiovascular;  Laterality: Left;  . LOWER EXTREMITY ANGIOGRAPHY Left 10/20/2017   Procedure: LOWER EXTREMITY ANGIOGRAPHY;  Surgeon: Algernon Huxley, MD;  Location: [place] [facility];  Service:  Cardiovascular;  Laterality: Left;  . LOWER EXTREMITY ANGIOGRAPHY Right 10/27/2017   Procedure: LOWER EXTREMITY ANGIOGRAPHY;  Surgeon: Algernon Huxley, MD;  Location: [place] [facility];  Service: Cardiovascular;  Laterality: Right;  . LUMBAR LAMINECTOMY/DECOMPRESSION MICRODISCECTOMY  01/16/2011   Procedure: LUMBAR LAMINECTOMY/DECOMPRESSION MICRODISCECTOMY;  Surgeon: Ophelia Charter;  Location: [place] [facility] OR[facility];  Service: Neurosurgery;  Laterality: N/A;  Lumbar four  and lumbar five laminectomies   . PACEMAKER INSERTION    . SPINE SURGERY      Prior to Admission medications   Medication Sig Start Date End Date Taking? Authorizing Provider  amLODipine (NORVASC) 10 MG tablet TAKE 1 TABLET (10 MG TOTAL) BY MOUTH DAILY. Patient taking differently: Take 10 mg by mouth every morning.  07/14/17   Crecencio Mc, MD  aspirin 325 MG tablet Take 325 mg by mouth daily.      [provider]  atorvastatin (LIPITOR) 20 MG tablet TAKE 1 TABLET (20 MG TOTAL) BY MOUTH DAILY. Patient taking differently: Take 20 mg by mouth daily at 6 PM.  10/23/17   Crecencio Mc, MD  Calcium Carbonate-Vitamin D (CALCIUM + D) 600-200 MG-UNIT TABS Take 1 tablet by mouth 2 (two) times daily.      [provider]  clopidogrel (PLAVIX) 75 MG tablet TAKE 1 TABLET (75 MG TOTAL) BY MOUTH DAILY. 10/23/17   Crecencio Mc, MD  collagenase (SANTYL) ointment Apply to wound twice a day Patient taking differently: Apply 1 application topically daily. Apply to wound twice a day 09/19/17   Kris Hartmann, NP  cyanocobalamin (,VITAMIN B-12,) 1000 MCG/ML injection INJECT 1 ML (1,000 MCG TOTAL) INTO THE MUSCLE EVERY 30 (THIRTY) DAYS. 09/25/17   Crecencio Mc, MD  fenofibrate (TRICOR) 48 MG tablet TAKE 1 TABLET BY MOUTH DAILY Patient taking differently: Take 48 mg by mouth every morning.  10/23/17   Crecencio Mc, MD  furosemide (LASIX) 20 MG tablet Take 2 tablets (40 mg total) by mouth daily. AS NEEDED FOR FLUID RETENTION Patient taking differently: Take 20 mg by mouth 2 (two) times daily.  07/10/17   Crecencio Mc, MD  gabapentin (NEURONTIN) 300 MG capsule TAKE 2 CAPSULES (600 MG TOTAL) BY MOUTH 3 (THREE) TIMES DAILY. Patient taking differently: Take 300 mg by mouth 3 (three) times daily.  09/18/17   Crecencio Mc, MD  gabapentin (NEURONTIN) 300 MG capsule TAKE 1 CAPSULE BY MOUTH THREE TIMES A DAY 10/23/17   Crecencio Mc, MD  glucose blood (ACCU-CHEK AVIVA PLUS) test strip CHECK  BLOOD SUGAR 3 TIMES A DAY AS NEEDED DX E11.51 07/25/17   Crecencio Mc, MD  HYDROcodone-acetaminophen (NORCO) 10-325 MG tablet Take 1 tablet by mouth every 6 (six) hours as needed. 10/29/17   Algernon Huxley, MD  hydrocortisone 2.5 % cream Apply 1 application topically 2 (two) times daily as needed. 09/14/13   [provider]  insulin aspart protamine - aspart (NOVOLOG MIX 70/30 FLEXPEN) (70-30) 100 UNIT/ML FlexPen INJECT 40 units two times daily , adjust dose using sliding scale 03/20/17   Crecencio Mc, MD  Insulin Pen Needle (B-D ULTRAFINE III SHORT PEN) 31G X 8 MM MISC USE AS DIRECTED 3 TIMES A DAY 01/21/17   Crecencio Mc, MD  Insulin Syringe-Needle U-100 (B-D INS SYRINGE 0.5CC/31GX5/16) 31G X 5/16" 0.5 ML MISC 1 application by Does not apply route 3 (three) times daily before meals. 01/08/17   Crecencio Mc, MD  latanoprost (XALATAN) 0.005 % ophthalmic  solution Place 1 drop into both eyes at bedtime. 10/27/14   [provider]  losartan (COZAAR) 100 MG tablet TAKE 1 TABLET BY MOUTH EVERY DAY Patient taking differently: Take 100 mg by mouth every morning.  10/23/17   Crecencio Mc, MD  metoprolol tartrate (LOPRESSOR) 100 MG tablet TAKE 1 TABLET BY MOUTH TWICE A DAY Patient taking differently: Take 50 mg by mouth 2 (two) times daily.  07/01/17   Crecencio Mc, MD  Multiple Vitamin (MULTIVITAMIN) tablet Take 1 tablet by mouth daily.    [provider]  Syringe, Disposable, 3 ML MISC For use with B12 injections weekly/monthly 01/08/17   Crecencio Mc, MD  SYRINGE-NEEDLE, DISP, 3 ML (B-D 3CC LUER-LOK SYR 25GX1") 25G X 1" 3 ML MISC FOR USE WITH B12 INJECTIONS WEEKLY/MONTHLY 01/15/17   Crecencio Mc, MD  traMADol (ULTRAM) 50 MG tablet Take 1 tablet (50 mg total) by mouth every 12 (twelve) hours as needed (alternate with hydrocodone). 10/29/17   Algernon Huxley, MD    Allergies  Allergen Reactions  . Hydrocodone-Acetaminophen Other (See Comments)    Hallucinations at  higher doses  . Lyrica [Pregabalin] Other (See Comments)    dizziness  . Tramadol Other (See Comments)    Hallucinations with higher doses  . Trazodone And Nefazodone Other (See Comments)    tachycardia    Family History  Problem Relation Age of Onset  . Mental illness Mother 28       alzheimers  . Peripheral vascular disease Father   . Diabetes Father   . Anesthesia problems Neg Hx   . Hypotension Neg Hx   . Malignant hyperthermia Neg Hx   . Pseudochol deficiency Neg Hx     Social History Social History   Tobacco Use  . Smoking status: Former Smoker    Last attempt to quit: 09/27/1985    Years since quitting: 32.1  . Smokeless tobacco: Never Used  Substance Use Topics  . Alcohol use: No    Comment: occassional  . Drug use: No    Review of Systems  Constitutional: Negative for fever. Eyes: Negative for visual changes. ENT: Negative for sore throat. Cardiovascular: Negative for chest pain. Respiratory: Negative for shortness of breath. Gastrointestinal: Negative for abdominal pain, vomiting and diarrhea. Genitourinary: Negative for dysuria. Musculoskeletal: Negative for back pain. Skin: Negative for rash. Neurological: Negative for headache.  ____________________________________________   PHYSICAL EXAM:  VITAL SIGNS: ED Triage Vitals  Enc Vitals Group     BP 11/07/17 1151 (!) 138/55     Pulse Rate 11/07/17 1151 69     Resp 11/07/17 1151 16     Temp 11/07/17 1151 98.5 F (36.9 C)     Temp Source 11/07/17 1151 Oral     SpO2 11/07/17 1151 100 %     Weight 11/07/17 1152 171 lb (77.6 kg)     Height 11/07/17 1152 5\' 7"  (1.702 m)     Head Circumference --      Peak Flow --      Pain Score 11/07/17 1152 2     Pain Loc --      Pain Edu? --      Excl. in [place]? --      Constitutional: Alert and cooperative.  HEENT      Head: Normocephalic and atraumatic.      Eyes: Conjunctivae are normal. Pupils equal and round.       Ears:  Nose: No  congestion/rhinnorhea.      Mouth/Throat: Mucous membranes are moist.      Neck: No stridor. Cardiovascular/Chest: Normal rate, regular rhythm.  No murmurs, rubs, or gallops. Respiratory: Normal respiratory effort without tachypnea nor retractions. Breath sounds are clear and equal bilaterally. No wheezes/rales/rhonchi. Gastrointestinal: Soft. No distention, no guarding, no rebound. Nontender.    Genitourinary/rectal:Deferred Musculoskeletal: Nontender with normal range of motion in all extremities. No joint effusions.  No lower extremity tenderness.  No edema. Left foot mottled and swollen and red concerning for cellulitis.  Area looks necrotic around the location of the recent third toe amputation site.  No palpable or ultrasound soft pedis pulse found. Neurologic:  Normal speech and language. No gross or focal neurologic deficits are appreciated. Skin:  Skin is warm, dry and intact. No rash noted. Psychiatric: Mood and affect are normal. Speech and behavior are normal. Patient exhibits appropriate insight and judgment.   ____________________________________________  LABS (pertinent positives/negatives) I, Lisa Roca, MD the attending physician have reviewed the labs noted below.  Labs Reviewed  CBC WITH DIFFERENTIAL/PLATELET - Abnormal; Notable for the following components:      Result Value   RBC 3.15 (*)    Hemoglobin 9.7 (*)    HCT 30.5 (*)    MCHC 31.8 (*)    RDW 15.2 (*)    Neutro Abs 6.7 (*)    All other components within normal limits  COMPREHENSIVE METABOLIC PANEL - Abnormal; Notable for the following components:   Potassium 5.3 (*)    Glucose, Bld 132 (*)    BUN 48 (*)    Creatinine, Ser 1.76 (*)    GFR calc non Af Amer 32 (*)    GFR calc Af Amer 37 (*)    All other components within normal limits  SEDIMENTATION RATE - Abnormal; Notable for the following components:   Sed Rate 56 (*)    All other components within normal limits  CK - Abnormal; Notable for the  following components:   Total CK 30 (*)    All other components within normal limits    ____________________________________________    EKG I, Lisa Roca, MD, the attending physician have personally viewed and interpreted all ECGs.  None ____________________________________________  RADIOLOGY   Left foot complete x-ray:  IMPRESSION: 1. S/p third toe amputation. New postoperative defect third metatarsal head versus osteomyelitis. 2. Second MTP joint space widening seen with effusion or projectional artifact. 3. Soft tissue swelling, no subcutaneous gas. __________________________________________  PROCEDURES  Procedure(s) performed: None  Procedures  Critical Care performed: None   ____________________________________________  ED COURSE / ASSESSMENT AND PLAN  Pertinent labs & imaging results that were available during my care of the patient were reviewed by me and considered in my medical decision making (see chart for details).    Patient with known vascular insufficiency with mottled foot concerning for poor circulation.  Does look concerning for infection.  He does not appear septic and otherwise.  Discussed with patient's vascular surgeon, Dr. Lucky Cowboy, who knows this patient has significantly poor peripheral vascular disease, and is concerned patient may likely end up needing additional amputation.  For now recommends heparin as well as antibiotics and hospital admission.    CONSULTATIONS: Dr. do, vascular surgery, recommends placing on heparin and admit to the hospital on antibiotics.  Hospitalist for admission.   Patient / Family / Caregiver informed of clinical course, medical decision-making process, and agree with plan.   ___________________________________________   FINAL CLINICAL IMPRESSION(S) /  ED DIAGNOSES   Final diagnoses:  Vascular insufficiency of extremity  Cellulitis of left foot       ___________________________________________         Note: This dictation was prepared with Dragon dictation. Any transcriptional errors that result from this process are unintentional    Lisa Roca, MD 11/07/17 1541

## 2017-11-07 NOTE — Progress Notes (Signed)
Advance care planning  Purpose of Encounter Left foot cellulitis, peripheral arterial disease and CODE STATUS discussion  Parties in Attendance Patient, [name] his son at bedside.  Patients Decisional capacity Patient is alert and oriented.  Has documented healthcare power of attorney and advanced directives.  Has his son and 2 daughters as healthcare power of attorney's.  Discussed regarding treatment plan of his left foot cellulitis, peripheral arterial disease.  All questions answered.  Discussed CODE STATUS.  Patient wants to be DO NOT RESUSCITATE and DO NOT INTUBATE.  Orders entered.   Time spent - 17 minutes

## 2017-11-07 NOTE — ED Notes (Signed)
Portable vascular doppler brought in to assess pt's pedal pulse, pulse noted, dr Cinda Quest into triage to see patient

## 2017-11-07 NOTE — Consult Note (Signed)
ANTICOAGULATION CONSULT NOTE - Initial Consult  Pharmacy Consult for heparin infusion dosing Indication: vascular insufficiency left foot  Allergies  Allergen Reactions  . Hydrocodone-Acetaminophen Other (See Comments)    Hallucinations at higher doses  . Lyrica [Pregabalin] Other (See Comments)    dizziness  . Tramadol Other (See Comments)    Hallucinations with higher doses  . Trazodone And Nefazodone Other (See Comments)    tachycardia    Patient Measurements: Height: 5\' 7"  (170.2 cm) Weight: 171 lb (77.6 kg) IBW/kg (Calculated) : 66.1 Heparin Dosing Weight: 77.6  Vital Signs: Temp: 98.5 F (36.9 C) (10/04 1151) Temp Source: Oral (10/04 1151) BP: 129/43 (10/04 1500) Pulse Rate: 59 (10/04 1500)  Labs: Recent Labs    11/07/17 1206  HGB 9.7*  HCT 30.5*  PLT 294  CREATININE 1.76*  CKTOTAL 30*    Estimated Creatinine Clearance: 25.6 mL/min (A) (by C-G formula based on SCr of 1.76 mg/dL (H)).   Medical History: Past Medical History:  Diagnosis Date  . Arthritis   . Blood transfusion   . Cancer ([place])    skin cancer on forehead  . Coronary artery disease    h/o bypass coronary- 2001 & peripheral- 2011 , last full cardiac visit  in [place] , [place], [employer]  . Diabetes mellitus   . H/O: malaria 58   in [place]  . Hyperlipidemia   . Hypertension   . Peripheral artery disease ([place])    folowed by Dew post bypass 2011 [place]  . Renal insufficiency    by Nov 2012 labs, no old records available    Medications:  No history of prior anticoagulation.  Assessment: 84 y.o. 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  He was seen at the wound center today and the foot looked mottled and concerning for cellulitis and vascular insufficiency and was sent to the ED for further evaluation.   Per vascular surgery recommends heparin. Per ED provider to be dosed per DVT dosing.  Goal of Therapy:  Heparin level 0.3-0.7 units/ml Monitor platelets by anticoagulation protocol: Yes   Plan:  Give 3900 units bolus x 1 Start heparin infusion at 1250 units/hr Will monitor heparin level every 8 hours until therapeutic and then daily. Continue to monitor H&H and platelets  Forrest Moron, PharmD 11/07/2017,4:03 PM

## 2017-11-07 NOTE — ED Triage Notes (Signed)
Pt was seen at the wound care clinic for post op removal of the left 3rd toe, pt reports increased pain intermittent since, pt's PA sent him over with concern over delayed cap refill in the foot, cool toes that are discolored. Pt has swelling noted to the bilat lower extremities, pt has redness to the distal portion of the left foot halfway down foot, foot is warm to touch and cap refill is brisk at 2 sec. Pedal pulse not palpated. 4th toe is purple in color

## 2017-11-07 NOTE — ED Notes (Signed)
Mechele Claude, aware of bed assigned

## 2017-11-07 NOTE — ED Notes (Signed)
[name], aware of bed assigned

## 2017-11-07 NOTE — H&P (Signed)
[facility] NAME: [name]    MR#:  [id number]  DATE OF BIRTH:  Mar 28, 1924  DATE OF ADMISSION:  11/07/2017  PRIMARY CARE PHYSICIAN: Crecencio Mc, MD   REQUESTING/REFERRING PHYSICIAN: Dr. Reita Cliche  CHIEF COMPLAINT:   Chief Complaint  Patient presents with  . Post-op Problem    HISTORY OF PRESENT ILLNESS:  [name]  is a 84 y.o. male with a known history of Beatties mellitus, hypertension, peripheral arterial disease who recently had left third toe amputation done presents to the emergency room due to increasing pain in his left leg along with swelling and redness.  Also found to have cyanotic changes and started on heparin drip and IV antibiotics in the emergency room.  Vascular surgery consult and and has seen the patient. History obtained from son at bedside.  Patient defers history and decision-making to son. Pain has worsened.  Patient is on aspirin and Plavix at home. Was on oral antibiotics 2 weeks back.  PAST MEDICAL HISTORY:   Past Medical History:  Diagnosis Date  . Arthritis   . Blood transfusion   . Cancer ([place])    skin cancer on forehead  . Coronary artery disease    h/o bypass coronary- 2001 & peripheral- 2011 , last full cardiac visit  in [place] , [place], [employer]  . Diabetes mellitus   . H/O: malaria 84   in [place]  . Hyperlipidemia   . Hypertension   . Peripheral artery disease ([place])    folowed by Dew post bypass 2011 [place]  . Renal insufficiency    by Nov 2012 labs, no old records available    PAST SURGICAL HISTORY:   Past Surgical History:  Procedure Laterality Date  . AMPUTATION Left 10/29/2017   Procedure: AMPUTATION RAY ( THIRD TOE );  Surgeon: Algernon Huxley, MD;  Location: [facility] ORS;  Service: Vascular;  Laterality: Left;  . CORONARY ARTERY BYPASS GRAFT  2001   4 vessel, done in [place]   . FEMORAL BYPASS  2011   done in [place],  now followed by [facility]  . INSERT /  REPLACE / REMOVE PACEMAKER     2004  . LOWER EXTREMITY ANGIOGRAPHY Left 08/18/2017   Procedure: LOWER EXTREMITY ANGIOGRAPHY;  Surgeon: Algernon Huxley, MD;  Location: [facility];  Service: Cardiovascular;  Laterality: Left;  . LOWER EXTREMITY ANGIOGRAPHY Left 10/20/2017   Procedure: LOWER EXTREMITY ANGIOGRAPHY;  Surgeon: Algernon Huxley, MD;  Location: [facility];  Service: Cardiovascular;  Laterality: Left;  . LOWER EXTREMITY ANGIOGRAPHY Right 10/27/2017   Procedure: LOWER EXTREMITY ANGIOGRAPHY;  Surgeon: Algernon Huxley, MD;  Location: [facility];  Service: Cardiovascular;  Laterality: Right;  . LUMBAR LAMINECTOMY/DECOMPRESSION MICRODISCECTOMY  01/16/2011   Procedure: LUMBAR LAMINECTOMY/DECOMPRESSION MICRODISCECTOMY;  Surgeon: Ophelia Charter;  Location: [facility];  Service: Neurosurgery;  Laterality: N/A;  Lumbar four and lumbar five laminectomies   . PACEMAKER INSERTION    . SPINE SURGERY      SOCIAL HISTORY:   Social History   Tobacco Use  . Smoking status: Former Smoker    Last attempt to quit: 09/27/1985    Years since quitting: 32.1  . Smokeless tobacco: Never Used  Substance Use Topics  . Alcohol use: No    Comment: occassional    FAMILY HISTORY:   Family History  Problem Relation Age of Onset  . Mental illness Mother 93  alzheimers  . Peripheral vascular disease Father   . Diabetes Father   . Anesthesia problems Neg Hx   . Hypotension Neg Hx   . Malignant hyperthermia Neg Hx   . Pseudochol deficiency Neg Hx     DRUG ALLERGIES:   Allergies  Allergen Reactions  . Hydrocodone-Acetaminophen Other (See Comments)    Hallucinations at higher doses  . Lyrica [Pregabalin] Other (See Comments)    dizziness  . Tramadol Other (See Comments)    Hallucinations with higher doses  . Trazodone And Nefazodone Other (See Comments)    tachycardia    REVIEW OF SYSTEMS:   Review of Systems  Constitutional: Positive for malaise/fatigue.  Negative for chills, fever and weight loss.  HENT: Negative for hearing loss and nosebleeds.   Eyes: Negative for blurred vision, double vision and pain.  Respiratory: Negative for cough, hemoptysis, sputum production, shortness of breath and wheezing.   Cardiovascular: Positive for leg swelling. Negative for chest pain, palpitations and orthopnea.  Gastrointestinal: Negative for abdominal pain, constipation, diarrhea, nausea and vomiting.  Genitourinary: Negative for dysuria and hematuria.  Musculoskeletal: Positive for back pain and joint pain. Negative for falls and myalgias.  Skin: Negative for rash.  Neurological: Negative for dizziness, tremors, sensory change, speech change, focal weakness, seizures and headaches.  Endo/Heme/Allergies: Does not bruise/bleed easily.  Psychiatric/Behavioral: Negative for depression and memory loss. The patient is not nervous/anxious.     MEDICATIONS AT HOME:   Prior to Admission medications   Medication Sig Start Date End Date Taking? Authorizing Provider  amLODipine (NORVASC) 10 MG tablet TAKE 1 TABLET (10 MG TOTAL) BY MOUTH DAILY. Patient taking differently: Take 10 mg by mouth every morning.  07/14/17  Yes Crecencio Mc, MD  aspirin 325 MG tablet Take 325 mg by mouth every evening.    Yes [provider]  atorvastatin (LIPITOR) 20 MG tablet TAKE 1 TABLET (20 MG TOTAL) BY MOUTH DAILY. Patient taking differently: Take 20 mg by mouth daily at 6 PM.  10/23/17  Yes Crecencio Mc, MD  Calcium Carbonate-Vitamin D (CALCIUM + D) 600-200 MG-UNIT TABS Take 1 tablet by mouth 2 (two) times daily.     Yes [provider]  clopidogrel (PLAVIX) 75 MG tablet TAKE 1 TABLET (75 MG TOTAL) BY MOUTH DAILY. 10/23/17  Yes Crecencio Mc, MD  collagenase (SANTYL) ointment Apply to wound twice a day Patient taking differently: Apply 1 application topically daily. Apply to wound twice a day 09/19/17  Yes Kris Hartmann, NP  cyanocobalamin (,VITAMIN B-12,)  1000 MCG/ML injection INJECT 1 ML (1,000 MCG TOTAL) INTO THE MUSCLE EVERY 30 (THIRTY) DAYS. 09/25/17  Yes Crecencio Mc, MD  fenofibrate (TRICOR) 48 MG tablet TAKE 1 TABLET BY MOUTH DAILY Patient taking differently: Take 48 mg by mouth every morning.  10/23/17  Yes Crecencio Mc, MD  furosemide (LASIX) 20 MG tablet Take 2 tablets (40 mg total) by mouth daily. AS NEEDED FOR FLUID RETENTION Patient taking differently: Take 20 mg by mouth 2 (two) times daily.  07/10/17  Yes Crecencio Mc, MD  glucose blood (ACCU-CHEK AVIVA PLUS) test strip CHECK BLOOD SUGAR 3 TIMES A DAY AS NEEDED DX E11.51 07/25/17  Yes Crecencio Mc, MD  HYDROcodone-acetaminophen (NORCO) 10-325 MG tablet Take 1 tablet by mouth every 6 (six) hours as needed. Patient taking differently: See admin instructions. Take  tablet by mouth daily at 1000 and 1 tablet by mouth at 2200 10/29/17  Yes Dew, Erskine Squibb,  MD  hydrocortisone 2.5 % cream Apply 1 application topically 2 (two) times daily as needed. 09/14/13  Yes [provider]  insulin aspart protamine - aspart (NOVOLOG MIX 70/30 FLEXPEN) (70-30) 100 UNIT/ML FlexPen INJECT 40 units two times daily , adjust dose using sliding scale 03/20/17  Yes Crecencio Mc, MD  Insulin Pen Needle (B-D ULTRAFINE III SHORT PEN) 31G X 8 MM MISC USE AS DIRECTED 3 TIMES A DAY 01/21/17  Yes Crecencio Mc, MD  Insulin Syringe-Needle U-100 (B-D INS SYRINGE 0.5CC/31GX5/16) 31G X 5/16" 0.5 ML MISC 1 application by Does not apply route 3 (three) times daily before meals. 01/08/17  Yes Crecencio Mc, MD  latanoprost (XALATAN) 0.005 % ophthalmic solution Place 1 drop into both eyes at bedtime. 10/27/14  Yes [provider]  losartan (COZAAR) 100 MG tablet TAKE 1 TABLET BY MOUTH EVERY DAY Patient taking differently: Take 100 mg by mouth every morning.  10/23/17  Yes Crecencio Mc, MD  metoprolol tartrate (LOPRESSOR) 100 MG tablet TAKE 1 TABLET BY MOUTH TWICE A DAY Patient taking differently: Take  50 mg by mouth 2 (two) times daily.  07/01/17  Yes Crecencio Mc, MD  Multiple Vitamin (MULTIVITAMIN) tablet Take 1 tablet by mouth every evening.    Yes [provider]  Syringe, Disposable, 3 ML MISC For use with B12 injections weekly/monthly 01/08/17  Yes Crecencio Mc, MD  SYRINGE-NEEDLE, DISP, 3 ML (B-D 3CC LUER-LOK SYR 25GX1") 25G X 1" 3 ML MISC FOR USE WITH B12 INJECTIONS WEEKLY/MONTHLY 01/15/17  Yes Crecencio Mc, MD  traMADol (ULTRAM) 50 MG tablet Take 1 tablet (50 mg total) by mouth every 12 (twelve) hours as needed (alternate with hydrocodone). 10/29/17  Yes Dew, Erskine Squibb, MD  gabapentin (NEURONTIN) 300 MG capsule TAKE 2 CAPSULES (600 MG TOTAL) BY MOUTH 3 (THREE) TIMES DAILY. Patient not taking: Reported on 11/07/2017 09/18/17   Crecencio Mc, MD  gabapentin (NEURONTIN) 300 MG capsule TAKE 1 CAPSULE BY MOUTH THREE TIMES A DAY Patient not taking: Reported on 11/07/2017 10/23/17   Crecencio Mc, MD     VITAL SIGNS:  Blood pressure (!) 139/50, pulse 60, temperature 98.5 F (36.9 C), temperature source Oral, resp. rate 14, height 5\' 7"  (1.702 m), weight 77.6 kg, SpO2 99 %.  PHYSICAL EXAMINATION:  Physical Exam  GENERAL:  84 y.o.-year-old patient lying in the bed with no acute distress.  Decreased hearing EYES: Pupils equal, round, reactive to light and accommodation. No scleral icterus. Extraocular muscles intact.  HEENT: Head atraumatic, normocephalic. Oropharynx and nasopharynx clear. No oropharyngeal erythema, moist oral mucosa  NECK:  Supple, no jugular venous distention. No thyroid enlargement, no tenderness.  LUNGS: Normal breath sounds bilaterally, no wheezing, rales, rhonchi. No use of accessory muscles of respiration.  CARDIOVASCULAR: S1, S2 normal. No murmurs, rubs, or gallops.  ABDOMEN: Soft, nontender, nondistended. Bowel sounds present. No organomegaly or mass.  EXTREMITIES: Bilateral lower extremity edema.  Left foot with third toe amputation wound clean.   Redness and cyanosis.  No pus. NEUROLOGIC: Cranial nerves II through XII are intact. No focal Motor or sensory deficits appreciated b/l PSYCHIATRIC: The patient is alert and oriented x 3. Good affect.  SKIN: No obvious rash, lesion, or ulcer.   LABORATORY PANEL:   CBC Recent Labs  Lab 11/07/17 1206  WBC 9.3  HGB 9.7*  HCT 30.5*  PLT 294   ------------------------------------------------------------------------------------------------------------------  Chemistries  Recent Labs  Lab 11/07/17 1206  NA 139  K 5.3*  CL 104  CO2 28  GLUCOSE 132*  BUN 48*  CREATININE 1.76*  CALCIUM 9.3  AST 16  ALT 13  ALKPHOS 59  BILITOT 0.4   ------------------------------------------------------------------------------------------------------------------  Cardiac Enzymes No results for input(s): TROPONINI in the last 168 hours. ------------------------------------------------------------------------------------------------------------------  RADIOLOGY:  Dg Foot Complete Left  Result Date: 11/07/2017 CLINICAL DATA:  Redness and swelling after toe amputation 2 weeks ago. EXAM: LEFT FOOT - COMPLETE 3+ VIEW COMPARISON:  CT LEFT foot October 22, 2017 FINDINGS: New erosive changes third metatarsal head, status post resection of third phalanges. No acute fracture deformity. No dislocation. Mild first MTP osteoarthrosis. Slight widening of the second MTP joint space. Type 2 navicular bone. Severe vascular calcifications. Soft tissue swelling without subcutaneous gas or radiopaque foreign bodies. IMPRESSION: 1. S/p third toe amputation. New postoperative defect third metatarsal head versus osteomyelitis. 2. Second MTP joint space widening seen with effusion or projectional artifact. 3. Soft tissue swelling, no subcutaneous gas. Electronically Signed   By: Elon Alas M.D.   On: 11/07/2017 13:51   IMPRESSION AND PLAN:   * Left foot cellulitis and PAD Start IV zosyn and vancomycin Ordered  blood cx. Pain medications PRN  * PAD Concern regarding acutely worsening peripheral arterial disease.  Case discussed with vascular surgery.  Start heparin drip.  Aspirin and Plavix.  *Diabetes mellitus.  Continue home dose of insulin.  Sliding scale insulin.  *Hypertension.  Continue home medications  *CKD stage III stable   All the records are reviewed and case discussed with ED provider. Management plans discussed with the patient, family and they are in agreement.  CODE STATUS: DNR  TOTAL TIME TAKING CARE OF THIS PATIENT: 40 minutes.   Leia Alf Madolin Twaddle M.D on 11/07/2017 at 5:14 PM  Between 7am to 6pm - Pager - [phone]  After 6pm go to [email or web address] - password [email or web address] [facility]  Office  [phone]  CC: Primary care physician; Crecencio Mc, MD  Note: This dictation was prepared with Dragon dictation along with smaller phrase technology. Any transcriptional errors that result from this process are unintentional.

## 2017-11-07 NOTE — ED Notes (Signed)
Son went to get patient dinner, will return with dinner.

## 2017-11-07 NOTE — ED Notes (Signed)
Pt eating dinner that son brought him. Denies further needs at this time. Awaiting bed assignment.

## 2017-11-07 NOTE — ED Notes (Signed)
Left foot cool to touch on upper portion of foot, where redness is on bottom portion, warm to touch. Pt denies pain. Cap refill delayed.

## 2017-11-07 NOTE — Consult Note (Signed)
[facility] Vascular Consult Note  MRN : [id number]  [name] is a 84 y.o. ([birth year]) male who presents with chief complaint of  Chief Complaint  Patient presents with  . Post-op Problem   History of Present Illness:  The patient is a 84 year old male with a past medical history of coronary artery disease, diabetes, hyperlipidemia, hypertension, renal insufficiency, peripheral artery disease s/p left third toe / metatarsal head amputation on 10/29/17.  The patient is well-known to our service as we have been treating him for peripheral artery disease and now recent toe amputation.  The patient has been receiving local wound care from the [facility].  He was seen and examined by Jeri Cos, PA-C today and was sent to the emergency department for further evaluation.  Patient was seen and examined with his son in the ED by his bedside.  The patient's son notes that the Wound Care PA felt his wound had "worsened" when compared to his previous appointment.  The son states that the wound care PA felt that the patient's wound was not healing, his foot and leg had become more edematous, erythematous and felt further evaluation by the emergency department and possibly the admission as an inpatient was needed.  The patient continues to experience continued pain radiating down the left leg.  He notes that this first started sometime in July 2019 after started undergoing angiograms.  The patient does sleep in a recliner and does not elevate his legs as this worsens his discomfort.  The patient notes relatively stable pain from the amputation site.  The patient denies any drainage or foul odor from the amputation site.  The patient does note his left foot has become more edematous and erythematous.  The amputation site is intact -skin is closed.  The site is not open.  The patient denies any fever, nausea vomiting.  Vascular surgery was  consulted by Dr. Darvin Neighbours for further recommendations.  Current Facility-Administered Medications  Medication Dose Route Frequency Provider Last Rate Last Dose  . piperacillin-tazobactam (ZOSYN) IVPB 3.375 g  3.375 g Intravenous Once Lisa Roca, MD 100 mL/hr at 11/07/17 1556 3.375 g at 11/07/17 1556   Current Outpatient Medications  Medication Sig Dispense Refill  . amLODipine (NORVASC) 10 MG tablet TAKE 1 TABLET (10 MG TOTAL) BY MOUTH DAILY. (Patient taking differently: Take 10 mg by mouth every morning. ) 90 tablet 2  . aspirin 325 MG tablet Take 325 mg by mouth daily.      Marland Kitchen atorvastatin (LIPITOR) 20 MG tablet TAKE 1 TABLET (20 MG TOTAL) BY MOUTH DAILY. (Patient taking differently: Take 20 mg by mouth daily at 6 PM. ) 90 tablet 1  . Calcium Carbonate-Vitamin D (CALCIUM + D) 600-200 MG-UNIT TABS Take 1 tablet by mouth 2 (two) times daily.      . clopidogrel (PLAVIX) 75 MG tablet TAKE 1 TABLET (75 MG TOTAL) BY MOUTH DAILY. 90 tablet 1  . collagenase (SANTYL) ointment Apply to wound twice a day (Patient taking differently: Apply 1 application topically daily. Apply to wound twice a day) 30 g 1  . cyanocobalamin (,VITAMIN B-12,) 1000 MCG/ML injection INJECT 1 ML (1,000 MCG TOTAL) INTO THE MUSCLE EVERY 30 (THIRTY) DAYS. 3 mL 3  . fenofibrate (TRICOR) 48 MG tablet TAKE 1 TABLET BY MOUTH DAILY (Patient taking differently: Take 48 mg by mouth every morning. ) 90 tablet 1  . furosemide (LASIX) 20 MG tablet Take 2 tablets (40 mg  total) by mouth daily. AS NEEDED FOR FLUID RETENTION (Patient taking differently: Take 20 mg by mouth 2 (two) times daily. ) 180 tablet 1  . gabapentin (NEURONTIN) 300 MG capsule TAKE 2 CAPSULES (600 MG TOTAL) BY MOUTH 3 (THREE) TIMES DAILY. (Patient taking differently: Take 300 mg by mouth 3 (three) times daily. ) 180 capsule 2  . gabapentin (NEURONTIN) 300 MG capsule TAKE 1 CAPSULE BY MOUTH THREE TIMES A DAY 270 capsule 0  . glucose blood (ACCU-CHEK AVIVA PLUS) test strip  CHECK BLOOD SUGAR 3 TIMES A DAY AS NEEDED DX E11.51 300 each 5  . HYDROcodone-acetaminophen (NORCO) 10-325 MG tablet Take 1 tablet by mouth every 6 (six) hours as needed. 30 tablet 0  . hydrocortisone 2.5 % cream Apply 1 application topically 2 (two) times daily as needed.    . insulin aspart protamine - aspart (NOVOLOG MIX 70/30 FLEXPEN) (70-30) 100 UNIT/ML FlexPen INJECT 40 units two times daily , adjust dose using sliding scale 75 pen 1  . Insulin Pen Needle (B-D ULTRAFINE III SHORT PEN) 31G X 8 MM MISC USE AS DIRECTED 3 TIMES A DAY 300 each 5  . Insulin Syringe-Needle U-100 (B-D INS SYRINGE 0.5CC/31GX5/16) 31G X 5/16" 0.5 ML MISC 1 application by Does not apply route 3 (three) times daily before meals. 300 each 3  . latanoprost (XALATAN) 0.005 % ophthalmic solution Place 1 drop into both eyes at bedtime.  3  . losartan (COZAAR) 100 MG tablet TAKE 1 TABLET BY MOUTH EVERY DAY (Patient taking differently: Take 100 mg by mouth every morning. ) 90 tablet 1  . metoprolol tartrate (LOPRESSOR) 100 MG tablet TAKE 1 TABLET BY MOUTH TWICE A DAY (Patient taking differently: Take 50 mg by mouth 2 (two) times daily. ) 180 tablet 1  . Multiple Vitamin (MULTIVITAMIN) tablet Take 1 tablet by mouth daily.    . Syringe, Disposable, 3 ML MISC For use with B12 injections weekly/monthly 25 each 0  . SYRINGE-NEEDLE, DISP, 3 ML (B-D 3CC LUER-LOK SYR 25GX1") 25G X 1" 3 ML MISC FOR USE WITH B12 INJECTIONS WEEKLY/MONTHLY 50 each 1  . traMADol (ULTRAM) 50 MG tablet Take 1 tablet (50 mg total) by mouth every 12 (twelve) hours as needed (alternate with hydrocodone). 20 tablet 0   Past Medical History:  Diagnosis Date  . Arthritis   . Blood transfusion   . Cancer ([place])    skin cancer on forehead  . Coronary artery disease    h/o bypass coronary- 2001 & peripheral- 2011 , last full cardiac visit  in [place] , [place], [employer]  . Diabetes mellitus   . H/O: malaria 44   in [place]  . Hyperlipidemia   .  Hypertension   . Peripheral artery disease ([place])    folowed by Dew post bypass 2011 [place]  . Renal insufficiency    by Nov 2012 labs, no old records available   Past Surgical History:  Procedure Laterality Date  . AMPUTATION Left 10/29/2017   Procedure: AMPUTATION RAY ( THIRD TOE );  Surgeon: Algernon Huxley, MD;  Location: [facility] ORS;  Service: Vascular;  Laterality: Left;  . CORONARY ARTERY BYPASS GRAFT  2001   4 vessel, done in [place]   . FEMORAL BYPASS  2011   done in [place],  now followed by [facility]  . INSERT / REPLACE / REMOVE PACEMAKER     2004  . LOWER EXTREMITY ANGIOGRAPHY Left 08/18/2017   Procedure: LOWER EXTREMITY ANGIOGRAPHY;  Surgeon: Lucky Cowboy,  Erskine Squibb, MD;  Location: [facility];  Service: Cardiovascular;  Laterality: Left;  . LOWER EXTREMITY ANGIOGRAPHY Left 10/20/2017   Procedure: LOWER EXTREMITY ANGIOGRAPHY;  Surgeon: Algernon Huxley, MD;  Location: [facility];  Service: Cardiovascular;  Laterality: Left;  . LOWER EXTREMITY ANGIOGRAPHY Right 10/27/2017   Procedure: LOWER EXTREMITY ANGIOGRAPHY;  Surgeon: Algernon Huxley, MD;  Location: [facility];  Service: Cardiovascular;  Laterality: Right;  . LUMBAR LAMINECTOMY/DECOMPRESSION MICRODISCECTOMY  01/16/2011   Procedure: LUMBAR LAMINECTOMY/DECOMPRESSION MICRODISCECTOMY;  Surgeon: Ophelia Charter;  Location: [facility];  Service: Neurosurgery;  Laterality: N/A;  Lumbar four and lumbar five laminectomies   . PACEMAKER INSERTION    . SPINE SURGERY     Social History Social History   Tobacco Use  . Smoking status: Former Smoker    Last attempt to quit: 09/27/1985    Years since quitting: 32.1  . Smokeless tobacco: Never Used  Substance Use Topics  . Alcohol use: No    Comment: occassional  . Drug use: No   Family History Family History  Problem Relation Age of Onset  . Mental illness Mother 58       alzheimers  . Peripheral vascular disease Father   . Diabetes Father   . Anesthesia problems Neg  Hx   . Hypotension Neg Hx   . Malignant hyperthermia Neg Hx   . Pseudochol deficiency Neg Hx   Denies family history of venous disease, renal disease or bleeding/clotting disorders.  Allergies  Allergen Reactions  . Hydrocodone-Acetaminophen Other (See Comments)    Hallucinations at higher doses  . Lyrica [Pregabalin] Other (See Comments)    dizziness  . Tramadol Other (See Comments)    Hallucinations with higher doses  . Trazodone And Nefazodone Other (See Comments)    tachycardia   REVIEW OF SYSTEMS (Negative unless checked)  Constitutional: [] Weight loss  [] Fever  [] Chills Cardiac: [] Chest pain   [] Chest pressure   [] Palpitations   [] Shortness of breath when laying flat   [] Shortness of breath at rest   [] Shortness of breath with exertion. Vascular:  [x] Pain in legs with walking   [x] Pain in legs at rest   [x] Pain in legs when laying flat   [] Claudication   [x] Pain in feet when walking  [] Pain in feet at rest  [x] Pain in feet when laying flat   [] History of DVT   [] Phlebitis   [x] Swelling in legs   [] Varicose veins   [x] Non-healing ulcers Pulmonary:   [] Uses home oxygen   [] Productive cough   [] Hemoptysis   [] Wheeze  [] COPD   [] Asthma Neurologic:  [] Dizziness  [] Blackouts   [] Seizures   [] History of stroke   [] History of TIA  [] Aphasia   [] Temporary blindness   [] Dysphagia   [] Weakness or numbness in arms   [] Weakness or numbness in legs Musculoskeletal:  [] Arthritis   [] Joint swelling   [] Joint pain   [] Low back pain Hematologic:  [] Easy bruising  [] Easy bleeding   [] Hypercoagulable state   [] Anemic  [] Hepatitis Gastrointestinal:  [] Blood in stool   [] Vomiting blood  [] Gastroesophageal reflux/heartburn   [] Difficulty swallowing. Genitourinary:  [x] Chronic kidney disease   [] Difficult urination  [] Frequent urination  [] Burning with urination   [] Blood in urine Skin:  [] Rashes   [x] Ulcers   [x] Wounds Psychological:  [] History of anxiety   []  History of major depression.  Physical  Examination  Vitals:   11/07/17 1151 11/07/17 1152 11/07/17 1430 11/07/17 1500  BP: (!) 138/55  Marland Kitchen)  147/54 (!) 129/43  Pulse: 69  62 (!) 59  Resp: 16     Temp: 98.5 F (36.9 C)     TempSrc: Oral     SpO2: 100%  100% 100%  Weight:  77.6 kg    Height:  5\' 7"  (1.702 m)     Body mass index is 26.78 kg/m. Gen:  WD/WN, NAD Head: [place]/AT, No temporalis wasting. Prominent temp pulse not noted. Ear/Nose/Throat: Hearing grossly intact, nares w/o erythema or drainage, oropharynx w/o Erythema/Exudate Eyes: Sclera non-icteric, conjunctiva clear Neck: Trachea midline.  No JVD.  Pulmonary:  Good air movement, respirations not labored, equal bilaterally.  Cardiac: RRR, normal S1, S2. Vascular:  Vessel Right Left  Radial Palpable Palpable  Ulnar Palpable Palpable  Brachial Palpable Palpable  Carotid Palpable, without bruit Palpable, without bruit  Aorta Not palpable N/A  Femoral Palpable Palpable  Popliteal Palpable Palpable  PT Non-Palpable Non-Palpable  DP Non-Palpable Non-Palpable   Left Lower Extremity: Moderate 1+ pitting edema noted.  Erythematous up to approximately mid calf.  The foot is warm.  There is a Doppler DP pulse.  Amputation site is clean dry and intact.  There is no foul odor or necrotic tissue.  There is some macerated tissue noted surrounding the amputation site.  Right Lower Extremity: Mild to moderate 1+ pitting edema noted.  Non-erythematous.  Non-cellulitic.  Dopplerable DP pulse noted.  Gastrointestinal: soft, non-tender/non-distended. No guarding/reflex.  Musculoskeletal: M/S 5/5 throughout.   Neurologic: Sensation grossly intact in extremities.  Symmetrical.  Speech is fluent. Motor exam as listed above. Psychiatric: Judgment intact, Mood & affect appropriate for pt's clinical situation. Dermatologic: As above Lymph : No Cervical, Axillary, or Inguinal lymphadenopathy.  CBC Lab Results  Component Value Date   WBC 9.3 11/07/2017   HGB 9.7 (L) 11/07/2017    HCT 30.5 (L) 11/07/2017   MCV 96.6 11/07/2017   PLT 294 11/07/2017   BMET    Component Value Date/Time   NA 139 11/07/2017 1206   K 5.3 (H) 11/07/2017 1206   CL 104 11/07/2017 1206   CO2 28 11/07/2017 1206   GLUCOSE 132 (H) 11/07/2017 1206   BUN 48 (H) 11/07/2017 1206   CREATININE 1.76 (H) 11/07/2017 1206   CREATININE 1.70 (H) 06/26/2011 1028   CALCIUM 9.3 11/07/2017 1206   GFRNONAA 32 (L) 11/07/2017 1206   GFRNONAA 36 (L) 06/26/2011 1028   GFRAA 37 (L) 11/07/2017 1206   GFRAA 42 (L) 06/26/2011 1028   Estimated Creatinine Clearance: 25.6 mL/min (A) (by C-G formula based on SCr of 1.76 mg/dL (H)).  COAG Lab Results  Component Value Date   INR 1.06 10/29/2017   Radiology Ct Foot Left Wo Contrast  Result Date: 10/23/2017 CLINICAL DATA:  Diabetic foot ulcer.  Foot pain and swelling. EXAM: CT OF THE LEFT FOOT WITHOUT CONTRAST TECHNIQUE: Multidetector CT imaging of the left foot was performed according to the standard protocol. Multiplanar CT image reconstructions were also generated. COMPARISON:  None. FINDINGS: Severe diffuse subcutaneous soft tissue swelling/edema/fluid suggesting cellulitis. No discrete rim enhancing drainable soft tissue abscess is identified. Moderate fatty atrophy of the foot and ankle musculature but no definite findings for myofasciitis or pyomyositis. No definite findings to suggest septic arthritis. Along the lateral aspect of the distal phalanx of the great toe the cortex becomes indistinct and somewhat lucent appearance. I could not exclude a focus of osteomyelitis in this area. No other definite areas of destructive bony change. There is a remote healed distal fibular fracture. The  tibiotalar and subtalar joints are maintained. Calcaneal spurring changes are noted. There is a large type 2 os tibiale externum. IMPRESSION: 1. Severe cellulitis without definite drainable abscess. 2. No definite CT findings for myofasciitis or pyomyositis or septic arthritis. 3.  Possible focus of osteomyelitis involving the lateral aspect of the distal phalanx of the great toe. Recommend correlation with patient's pain, tenderness and ulcer. If symptoms persist or worsen MRI is suggested for further evaluation, if possible. Electronically Signed   By: Marijo Sanes M.D.   On: 10/23/2017 15:36   Ct Foot Right Wo Contrast  Result Date: 10/23/2017 CLINICAL DATA:  Diabetic foot ulcer.  Pain and swelling. EXAM: CT OF THE RIGHT FOOT WITHOUT CONTRAST TECHNIQUE: Multidetector CT imaging of the right foot was performed according to the standard protocol. Multiplanar CT image reconstructions were also generated. COMPARISON:  None. FINDINGS: Severe diffuse subcutaneous soft tissue swelling/edema/fluid most notably along the dorsum of the foot consistent with severe cellulitis. No discrete drainable soft tissue abscess is identified. No definite findings for myofasciitis or pyomyositis. Moderate diffuse atrophy of the foot and ankle musculature. No definite findings for septic arthritis. No destructive bony changes to suggest osteomyelitis. Evidence of prior amputation of the distal phalanx of the great toe. Severe small vessel calcifications. Significant chronic appearing Achilles tendinopathy with probable interstitial tears. IMPRESSION: 1. Severe diffuse subcutaneous soft tissue swelling suggesting cellulitis. No discrete drainable soft tissue abscess. 2. No definite findings for myofasciitis, pyomyositis or septic arthritis or osteomyelitis. 3. Severe small vessel calcifications. 4. Chronic appearing Achilles tendinopathy with interstitial tearing. 5. Prior amputation of the distal phalanx of the great toe. Electronically Signed   By: Marijo Sanes M.D.   On: 10/23/2017 15:41   Dg Foot Complete Left  Result Date: 11/07/2017 CLINICAL DATA:  Redness and swelling after toe amputation 2 weeks ago. EXAM: LEFT FOOT - COMPLETE 3+ VIEW COMPARISON:  CT LEFT foot October 22, 2017 FINDINGS: New  erosive changes third metatarsal head, status post resection of third phalanges. No acute fracture deformity. No dislocation. Mild first MTP osteoarthrosis. Slight widening of the second MTP joint space. Type 2 navicular bone. Severe vascular calcifications. Soft tissue swelling without subcutaneous gas or radiopaque foreign bodies. IMPRESSION: 1. S/p third toe amputation. New postoperative defect third metatarsal head versus osteomyelitis. 2. Second MTP joint space widening seen with effusion or projectional artifact. 3. Soft tissue swelling, no subcutaneous gas. Electronically Signed   By: Elon Alas M.D.   On: 11/07/2017 13:51   Assessment/Plan The patient is a 84 year old male with a past medical history of coronary artery disease, diabetes, hyperlipidemia, hypertension, renal insufficiency, peripheral artery disease s/p left third toe / metatarsal head amputation on 10/29/17 the patient presents to the [facility] emergency department at the request of the wound center PA for further wound evaluation - stable 1. PAD with left third toe amputation: The patient is well-known to our service.  The patient has a past medical history of severe peripheral artery disease requiring multiple endovascular interventions.  Most recent intervention was amputation of the third left toe due to loss of viable tissue.  The patient has severe small vessel disease to the left foot.  Unsure at this point if further endovascular intervention will be possible.  Agree with admission as an inpatient for heparin and IV antibiotics.  Would consider left lower extremity angiogram with possible intervention on Monday which would most likely be diagnostic for possible TMA versus BKA.  Procedure, risks and benefits  explained to both the patient and the son.  They expressed their understanding and are in agreement. 2. Diabetes: On appropriate medications. Encouraged good control as its slows the progression  of atherosclerotic disease. 3. Hyperlipidemia: On aspirin and statin. Encouraged good control as its slows the progression of atherosclerotic disease 5. Hypertension: On appropriate medications. Encouraged good control as its slows the progression of atherosclerotic disease  Discussed with Dr. Mayme Genta, PA-C  11/07/2017 3:59 PM  This note was created with Dragon medical transcription system.  Any error is purely unintentional.

## 2017-11-07 NOTE — Consult Note (Addendum)
Pharmacy Antibiotic Note  [name] is a 84 y.o. male admitted on 11/07/2017 with osteomyelitis and wound infection.  Pharmacy has been consulted for vancomycin and Zosyn dosing.  Plan: Vancomycin 1000 mg IV once and followed by 6 hour stacked dose vancomycin 1000 mg IV every 24 hours.  Goal trough 15-20 mcg/mL.  Will draw trough 30 minutes prior to the fifth dose.  Zosyn 3.375 gm extended infusion  IV every 8 hours  Height: 5\' 7"  (170.2 cm) Weight: 171 lb (77.6 kg) IBW/kg (Calculated) : 66.1  Temp (24hrs), Avg:98.5 F (36.9 C), Min:98.5 F (36.9 C), Max:98.5 F (36.9 C)  Recent Labs  Lab 11/07/17 1206  WBC 9.3  CREATININE 1.76*    Estimated Creatinine Clearance: 25.6 mL/min (A) (by C-G formula based on SCr of 1.76 mg/dL (H)).    Allergies  Allergen Reactions  . Hydrocodone-Acetaminophen Other (See Comments)    Hallucinations at higher doses  . Lyrica [Pregabalin] Other (See Comments)    dizziness  . Tramadol Other (See Comments)    Hallucinations with higher doses  . Trazodone And Nefazodone Other (See Comments)    tachycardia    Antimicrobials this admission: Zosyn 10/04 >> 10/04 Vacno 10/04 >>   Dose adjustments this admission:   Microbiology results: 10/04 BCx:   UCx:    Sputum:    MRSA PCR:   Thank you for allowing pharmacy to be a part of this patient's care.  Forrest Moron 11/07/2017 4:32 PM

## 2017-11-07 NOTE — ED Notes (Signed)
ED Provider at bedside. 

## 2017-11-08 LAB — CBC
HCT: 26.3 % — ABNORMAL LOW (ref 40.0–52.0)
Hemoglobin: 8.6 g/dL — ABNORMAL LOW (ref 13.0–18.0)
MCH: 30.9 pg (ref 26.0–34.0)
MCHC: 32.7 g/dL (ref 32.0–36.0)
MCV: 94.8 fL (ref 80.0–100.0)
PLATELETS: 262 10*3/uL (ref 150–440)
RBC: 2.77 MIL/uL — AB (ref 4.40–5.90)
RDW: 14.8 % — ABNORMAL HIGH (ref 11.5–14.5)
WBC: 7.6 10*3/uL (ref 3.8–10.6)

## 2017-11-08 LAB — BASIC METABOLIC PANEL
Anion gap: 11 (ref 5–15)
BUN: 48 mg/dL — ABNORMAL HIGH (ref 8–23)
CALCIUM: 8.3 mg/dL — AB (ref 8.9–10.3)
CO2: 24 mmol/L (ref 22–32)
CREATININE: 2.07 mg/dL — AB (ref 0.61–1.24)
Chloride: 102 mmol/L (ref 98–111)
GFR calc non Af Amer: 26 mL/min — ABNORMAL LOW (ref >60)
GFR, EST AFRICAN AMERICAN: 31 mL/min — AB (ref >60)
GLUCOSE: 282 mg/dL — AB (ref 70–99)
Potassium: 4.3 mmol/L (ref 3.5–5.1)
SODIUM: 137 mmol/L (ref 135–145)

## 2017-11-08 LAB — MAGNESIUM: Magnesium: 1.8 mg/dL (ref 1.7–2.4)

## 2017-11-08 LAB — GLUCOSE, CAPILLARY
GLUCOSE-CAPILLARY: 124 mg/dL — AB (ref 70–99)
Glucose-Capillary: 170 mg/dL — ABNORMAL HIGH (ref 70–99)
Glucose-Capillary: 147 mg/dL — ABNORMAL HIGH (ref 70–99)
Glucose-Capillary: 183 mg/dL — ABNORMAL HIGH (ref 70–99)

## 2017-11-08 LAB — HEPARIN LEVEL (UNFRACTIONATED)
HEPARIN UNFRACTIONATED: 0.34 [IU]/mL (ref 0.30–0.70)
Heparin Unfractionated: 0.47 IU/mL (ref 0.30–0.70)

## 2017-11-08 LAB — HEMOGLOBIN A1C
Hgb A1c MFr Bld: 7.2 % — ABNORMAL HIGH (ref 4.8–5.6)
Mean Plasma Glucose: 160 mg/dL

## 2017-11-08 MED ORDER — SODIUM CHLORIDE 0.9 % IV SOLN
3.0000 g | Freq: Two times a day (BID) | INTRAVENOUS | Status: DC
  Administered 2017-11-08 – 2017-11-11 (×6): 3 g via INTRAVENOUS
  Filled 2017-11-08 (×8): qty 3

## 2017-11-08 MED ORDER — INSULIN ASPART PROT & ASPART (70-30 MIX) 100 UNIT/ML ~~LOC~~ SUSP
15.0000 [IU] | Freq: Two times a day (BID) | SUBCUTANEOUS | Status: DC
  Administered 2017-11-11: 15 [IU] via SUBCUTANEOUS
  Filled 2017-11-08: qty 10

## 2017-11-08 NOTE — Consult Note (Addendum)
ANTICOAGULATION CONSULT NOTE - Initial Consult  Pharmacy Consult for heparin infusion dosing Indication: vascular insufficiency left foot  Allergies  Allergen Reactions  . Hydrocodone-Acetaminophen Other (See Comments)    Hallucinations at higher doses  . Lyrica [Pregabalin] Other (See Comments)    dizziness  . Tramadol Other (See Comments)    Hallucinations with higher doses  . Trazodone And Nefazodone Other (See Comments)    tachycardia    Patient Measurements: Height: 5\' 8"  (172.7 cm) Weight: 164 lb 8 oz (74.6 kg) IBW/kg (Calculated) : 68.4 Heparin Dosing Weight: 77.6  Vital Signs: Temp: 98.6 F (37 C) (10/04 2043) Temp Source: Oral (10/04 2043) BP: 149/88 (10/04 2043) Pulse Rate: 64 (10/04 2043)  Labs: Recent Labs    11/07/17 1206 11/07/17 1536 11/08/17 0053  HGB 9.7*  --  8.6*  HCT 30.5*  --  26.3*  PLT 294  --  262  APTT  --  32  --   LABPROT  --  14.2  --   INR  --  1.11  --   HEPARINUNFRC  --   --  0.47  CREATININE 1.76*  --  2.07*  CKTOTAL 30*  --   --     Estimated Creatinine Clearance: 22.5 mL/min (A) (by C-G formula based on SCr of 2.07 mg/dL (H)).   Medical History: Past Medical History:  Diagnosis Date  . Arthritis   . Blood transfusion   . Cancer ([place])    skin cancer on forehead  . Coronary artery disease    h/o bypass coronary- 2001 & peripheral- 2011 , last full cardiac visit  in [place] , [place], [employer]  . Diabetes mellitus   . H/O: malaria 19   in [place]  . Hyperlipidemia   . Hypertension   . Peripheral artery disease ([place])    folowed by Dew post bypass 2011 [place]  . Renal insufficiency    by Nov 2012 labs, no old records available    Medications:  No history of prior anticoagulation.  Assessment: 84 y.o. male with a history of significant left lower extremity peripheral vascular disease, ocular insufficiency resulting in third toe amputation within the past month, for which he is following up at the wound  center.  He was seen at the wound center today and the foot looked mottled and concerning for cellulitis and vascular insufficiency and was sent to the ED for further evaluation.  Per vascular surgery recommends heparin. Per ED provider to be dosed per DVT dosing.  Goal of Therapy:  Heparin level 0.3-0.7 units/ml Monitor platelets by anticoagulation protocol: Yes   Plan:  Give 3900 units bolus x 1 Start heparin infusion at 1250 units/hr Will monitor heparin level every 8 hours until therapeutic and then daily. Continue to monitor H&H and platelets  10/05 0100 heparin level 0.47. Continue current regimen. Recheck with AM labs to confirm.  10/05 0500 heparin level 0.34. Continue current regimen. Recheck heparin level and CBC with tomorrow AM labs  Reneta Niehaus S, PharmD 11/08/2017,2:24 AM

## 2017-11-08 NOTE — Care Management Note (Signed)
Case Management Note  Patient Details  Name: [name] MRN: [id number] Date of Birth: 01-29-1925  Subjective/Objective:                  Per medical note history patient is open to home Palliative of [facility]. He is also open to [facility] home [facility].   Action/Plan: Message sent to [facility] with [facility].   Expected Discharge Date:                  Expected Discharge Plan:     In-House Referral:     Discharge planning Services  CM Consult  Post Acute Care Choice:  Resumption of Svcs/PTA Provider, Home Health Choice offered to:     DME Arranged:    DME Agency:     HH Arranged:    HH Agency:  [facility]  Status of Service:  In process, will continue to follow  If discussed at Long Length of Stay Meetings, dates discussed:    Additional Comments:  Marshell Garfinkel, RN 11/08/2017, 11:23 AM

## 2017-11-08 NOTE — Progress Notes (Signed)
[facility] [place] NAME: [name]    MR#:  [id number]  DATE OF BIRTH:  1924/11/22  SUBJECTIVE:  CHIEF COMPLAINT: Patient is resting comfortably.  Left leg pain is tolerable, patient is aware that he is scheduled for angiogram on Monday  REVIEW OF SYSTEMS:   Review of Systems  Constitutional: Positive for malaise/fatigue. Negative for chills, fever and weight loss.  HENT: Negative for hearing loss and nosebleeds.   Eyes: Negative for blurred vision, double vision and pain.  Respiratory: Negative for cough, hemoptysis, sputum production, shortness of breath and wheezing.   Cardiovascular: Positive for leg swelling. Negative for chest pain, palpitations and orthopnea.  Gastrointestinal: Negative for abdominal pain, constipation, diarrhea, nausea and vomiting.  Genitourinary: Negative for dysuria and hematuria.  Musculoskeletal: Positive for back pain and joint pain. Negative for falls and myalgias.  Skin: Negative for rash.  Neurological: Negative for dizziness, tremors, sensory change, speech change, focal weakness, seizures and headaches.  Endo/Heme/Allergies: Does not bruise/bleed easily.  Psychiatric/Behavioral: Negative for depression and memory loss. The patient is not nervous/anxious DRUG ALLERGIES:   Allergies  Allergen Reactions  . Hydrocodone-Acetaminophen Other (See Comments)    Hallucinations at higher doses  . Lyrica [Pregabalin] Other (See Comments)    dizziness  . Tramadol Other (See Comments)    Hallucinations with higher doses  . Trazodone And Nefazodone Other (See Comments)    tachycardia    VITALS:  Blood pressure (!) 146/56, pulse 63, temperature (!) 97.5 F (36.4 C), temperature source Oral, resp. rate 16, height 5\' 8"  (1.727 m), weight 74.7 kg, SpO2 100 %.  PHYSICAL EXAMINATION:  GENERAL:  84 y.o.-year-old patient lying in the bed with no acute distress.  EYES: Pupils equal, round, reactive to  light and accommodation. No scleral icterus. Extraocular muscles intact.  HEENT: Head atraumatic, normocephalic. Oropharynx and nasopharynx clear.  NECK:  Supple, no jugular venous distention. No thyroid enlargement, no tenderness.  LUNGS: Normal breath sounds bilaterally, no wheezing, rales,rhonchi or crepitation. No use of accessory muscles of respiration.  CARDIOVASCULAR: S1, S2 normal. No murmurs, rubs, or gallops.  ABDOMEN: Soft, nontender, nondistended. Bowel sounds present. No organomegaly or mass.  EXTREMITIES: Bilateral lower extremity edema with the left foot third toe amputation.  Erythema and cyanotic.  No purulent discharge NEUROLOGIC: Cranial nerves II through XII are intact. Muscle strength generalized weakness in all extremities. Sensation intact. Gait not checked.  PSYCHIATRIC: The patient is alert and oriented x 3.  SKIN: No obvious rash, lesion, or ulcer.    LABORATORY PANEL:   CBC Recent Labs  Lab 11/08/17 0053  WBC 7.6  HGB 8.6*  HCT 26.3*  PLT 262   ------------------------------------------------------------------------------------------------------------------  Chemistries  Recent Labs  Lab 11/07/17 1206 11/08/17 0053  NA 139 137  K 5.3* 4.3  CL 104 102  CO2 28 24  GLUCOSE 132* 282*  BUN 48* 48*  CREATININE 1.76* 2.07*  CALCIUM 9.3 8.3*  MG  --  1.8  AST 16  --   ALT 13  --   ALKPHOS 59  --   BILITOT 0.4  --    ------------------------------------------------------------------------------------------------------------------  Cardiac Enzymes No results for input(s): TROPONINI in the last 168 hours. ------------------------------------------------------------------------------------------------------------------  RADIOLOGY:  Dg Foot Complete Left  Result Date: 11/07/2017 CLINICAL DATA:  Redness and swelling after toe amputation 2 weeks ago. EXAM: LEFT FOOT - COMPLETE 3+ VIEW COMPARISON:  CT LEFT foot October 22, 2017 FINDINGS: New erosive  changes third  metatarsal head, status post resection of third phalanges. No acute fracture deformity. No dislocation. Mild first MTP osteoarthrosis. Slight widening of the second MTP joint space. Type 2 navicular bone. Severe vascular calcifications. Soft tissue swelling without subcutaneous gas or radiopaque foreign bodies. IMPRESSION: 1. S/p third toe amputation. New postoperative defect third metatarsal head versus osteomyelitis. 2. Second MTP joint space widening seen with effusion or projectional artifact. 3. Soft tissue swelling, no subcutaneous gas. Electronically Signed   By: Elon Alas M.D.   On: 11/07/2017 13:51    EKG:   Orders placed or performed during the hospital encounter of 10/29/17  . EKG test  . EKG test    ASSESSMENT AND PLAN:    * Left foot cellulitis and PAD Patient initially started on IV zosyn and vancomycin, changed to Unasyn and vancomycin Follow-up blood cx. Pain medications PRN  * PAD Concern regarding acutely worsening peripheral arterial disease.  Case discussed with vascular surgery.    heparin drip.  Aspirin and Plavix. Scheduled for angiogram on Monday.  N.p.o. Sunday midnight  *Diabetes mellitus.  Continue home dose of insulin.  Sliding scale insulin.  *Hypertension.  Continue home medications  * CKD stage III stable Creatinine 2.0 seems to be his baseline.  Avoid nephrotoxins and monitor renal function closely.    All the records are reviewed and case discussed with Care Management/Social Workerr. Management plans discussed with the patient, family and they are in agreement.  CODE STATUS: DNR  TOTAL TIME TAKING CARE OF THIS PATIENT: 34 minutes.   POSSIBLE D/C IN 2-3  DAYS, DEPENDING ON CLINICAL CONDITION.  Note: This dictation was prepared with Dragon dictation along with smaller phrase technology. Any transcriptional errors that result from this process are unintentional.   Nicholes Mango M.D on 11/08/2017 at 12:53 PM  Between  7am to 6pm - Pager - [phone] After 6pm go to [email or web address] - password [email or web address] [facility]  Office  [phone]  CC: Primary care physician; Crecencio Mc, MD

## 2017-11-08 NOTE — Progress Notes (Signed)
Pharmacy Antibiotic Note  [name] is a 84 y.o. male admitted on 11/07/2017 with cellulitis.  Pharmacy has been consulted for Unasyn dosing.  Plan: Will start Unasyn 3g IV q12h  Height: 5\' 8"  (172.7 cm) Weight: 164 lb 10.9 oz (74.7 kg) IBW/kg (Calculated) : 68.4  Temp (24hrs), Avg:98.1 F (36.7 C), Min:97.5 F (36.4 C), Max:98.6 F (37 C)  Recent Labs  Lab 11/07/17 1206 11/08/17 0053  WBC 9.3 7.6  CREATININE 1.76* 2.07*    Estimated Creatinine Clearance: 22.5 mL/min (A) (by C-G formula based on SCr of 2.07 mg/dL (H)).    Allergies  Allergen Reactions  . Hydrocodone-Acetaminophen Other (See Comments)    Hallucinations at higher doses  . Lyrica [Pregabalin] Other (See Comments)    dizziness  . Tramadol Other (See Comments)    Hallucinations with higher doses  . Trazodone And Nefazodone Other (See Comments)    tachycardia   Thank you for allowing pharmacy to be a part of this patient's care.  Tobie Lords, PharmD, BCPS Clinical Pharmacist 11/08/2017

## 2017-11-09 ENCOUNTER — Encounter (INDEPENDENT_AMBULATORY_CARE_PROVIDER_SITE_OTHER): Payer: Self-pay

## 2017-11-09 LAB — CBC
HEMATOCRIT: 28 % — AB (ref 40.0–52.0)
HEMOGLOBIN: 9.4 g/dL — AB (ref 13.0–18.0)
MCH: 31.5 pg (ref 26.0–34.0)
MCHC: 33.7 g/dL (ref 32.0–36.0)
MCV: 93.7 fL (ref 80.0–100.0)
Platelets: 276 10*3/uL (ref 150–440)
RBC: 2.99 MIL/uL — ABNORMAL LOW (ref 4.40–5.90)
RDW: 15.2 % — ABNORMAL HIGH (ref 11.5–14.5)
WBC: 9.3 10*3/uL (ref 3.8–10.6)

## 2017-11-09 LAB — BASIC METABOLIC PANEL
Anion gap: 9 (ref 5–15)
BUN: 38 mg/dL — AB (ref 8–23)
CHLORIDE: 103 mmol/L (ref 98–111)
CO2: 25 mmol/L (ref 22–32)
CREATININE: 1.91 mg/dL — AB (ref 0.61–1.24)
Calcium: 8.7 mg/dL — ABNORMAL LOW (ref 8.9–10.3)
GFR calc Af Amer: 34 mL/min — ABNORMAL LOW (ref >60)
GFR calc non Af Amer: 29 mL/min — ABNORMAL LOW (ref >60)
Glucose, Bld: 168 mg/dL — ABNORMAL HIGH (ref 70–99)
Potassium: 4.1 mmol/L (ref 3.5–5.1)
Sodium: 137 mmol/L (ref 135–145)

## 2017-11-09 LAB — MAGNESIUM: Magnesium: 1.7 mg/dL (ref 1.7–2.4)

## 2017-11-09 LAB — GLUCOSE, CAPILLARY
GLUCOSE-CAPILLARY: 153 mg/dL — AB (ref 70–99)
GLUCOSE-CAPILLARY: 134 mg/dL — AB (ref 70–99)
Glucose-Capillary: 183 mg/dL — ABNORMAL HIGH (ref 70–99)
Glucose-Capillary: 89 mg/dL (ref 70–99)

## 2017-11-09 LAB — HEPARIN LEVEL (UNFRACTIONATED): Heparin Unfractionated: 0.48 IU/mL (ref 0.30–0.70)

## 2017-11-09 MED ORDER — COLLAGENASE 250 UNIT/GM EX OINT
TOPICAL_OINTMENT | Freq: Every day | CUTANEOUS | Status: DC
  Administered 2017-11-09 – 2017-11-11 (×3): via TOPICAL
  Filled 2017-11-09: qty 30

## 2017-11-09 NOTE — Progress Notes (Signed)
Subjective/Chief Complaint: States Left foot pain has improved from initial presentation. Otherwise without complaint  Objective: Vital signs in last 24 hours: Temp:  [97.7 F (36.5 C)-98.6 F (37 C)] 98.5 F (36.9 C) (10/06 0553) Pulse Rate:  [63-69] 66 (10/06 0553) Resp:  [10-18] 18 (10/05 1925) BP: (126-146)/(50-58) 126/55 (10/06 0758) SpO2:  [99 %-100 %] 100 % (10/06 0553) Last BM Date: 11/09/17  Intake/Output from previous day: 10/05 0701 - 10/06 0700 In: 756.7 [I.V.:327; IV Piggyback:429.7] Out: 1850 [Urine:1850] Intake/Output this shift: Total I/O In: -  Out: 325 [Urine:325]  General appearance: alert and no distress Cardio: regular rate and rhythm Extremities: Erythema to mid calf, warm, amputation site with some necrosis, doppler DP  Lab Results:  Recent Labs    11/08/17 0053 11/09/17 0439  WBC 7.6 9.3  HGB 8.6* 9.4*  HCT 26.3* 28.0*  PLT 262 276   BMET Recent Labs    11/08/17 0053 11/09/17 0439  NA 137 137  K 4.3 4.1  CL 102 103  CO2 24 25  GLUCOSE 282* 168*  BUN 48* 38*  CREATININE 2.07* 1.91*  CALCIUM 8.3* 8.7*   PT/INR Recent Labs    11/07/17 1536  LABPROT 14.2  INR 1.11   ABG No results for input(s): PHART, HCO3 in the last 72 hours.  Invalid input(s): PCO2, PO2  Studies/Results: Dg Foot Complete Left  Result Date: 11/07/2017 CLINICAL DATA:  Redness and swelling after toe amputation 2 weeks ago. EXAM: LEFT FOOT - COMPLETE 3+ VIEW COMPARISON:  CT LEFT foot October 22, 2017 FINDINGS: New erosive changes third metatarsal head, status post resection of third phalanges. No acute fracture deformity. No dislocation. Mild first MTP osteoarthrosis. Slight widening of the second MTP joint space. Type 2 navicular bone. Severe vascular calcifications. Soft tissue swelling without subcutaneous gas or radiopaque foreign bodies. IMPRESSION: 1. S/p third toe amputation. New postoperative defect third metatarsal head versus osteomyelitis. 2.  Second MTP joint space widening seen with effusion or projectional artifact. 3. Soft tissue swelling, no subcutaneous gas. Electronically Signed   By: Elon Alas M.D.   On: 11/07/2017 13:51    Anti-infectives: Anti-infectives (From admission, onward)   Start     Dose/Rate Route Frequency Ordered Stop   11/08/17 1300  Ampicillin-Sulbactam (UNASYN) 3 g in sodium chloride 0.9 % 100 mL IVPB     3 g 200 mL/hr over 30 Minutes Intravenous Every 12 hours 11/08/17 1257     11/08/17 0000  piperacillin-tazobactam (ZOSYN) IVPB 3.375 g  Status:  Discontinued     3.375 g 12.5 mL/hr over 240 Minutes Intravenous Every 8 hours 11/07/17 1741 11/08/17 1251   11/07/17 2300  vancomycin (VANCOCIN) IVPB 1000 mg/200 mL premix     1,000 mg 200 mL/hr over 60 Minutes Intravenous Every 24 hours 11/07/17 1741     11/07/17 1645  vancomycin (VANCOCIN) IVPB 1000 mg/200 mL premix     1,000 mg 200 mL/hr over 60 Minutes Intravenous  Once 11/07/17 1630 11/07/17 1817   11/07/17 1530  piperacillin-tazobactam (ZOSYN) IVPB 3.375 g     3.375 g 100 mL/hr over 30 Minutes Intravenous  Once 11/07/17 1525 11/07/17 1627      Assessment/Plan: s/p * No surgery found * Plan for angiogram tomorrow.to determine if any further revascularization is feasible. The patient has small vessel disease, therefore it is possible the patient may require a TMA if symptoms do not improve with anticoagulation and conservative management.   LOS: 2 days    Esco,  Miechia A 11/09/2017

## 2017-11-09 NOTE — Progress Notes (Signed)
[facility] [place] NAME: [name]    MR#:  [id number]  DATE OF BIRTH:  [birth year]  SUBJECTIVE:  CHIEF COMPLAINT: Patient is resting comfortably.  No new complaints. patient is aware that he is scheduled for angiogram on Monday  REVIEW OF SYSTEMS:   Review of Systems  Constitutional: Positive for malaise/fatigue. Negative for chills, fever and weight loss.  HENT: Negative for hearing loss and nosebleeds.   Eyes: Negative for blurred vision, double vision and pain.  Respiratory: Negative for cough, hemoptysis, sputum production, shortness of breath and wheezing.   Cardiovascular: Positive for leg swelling. Negative for chest pain, palpitations and orthopnea.  Gastrointestinal: Negative for abdominal pain, constipation, diarrhea, nausea and vomiting.  Genitourinary: Negative for dysuria and hematuria.  Musculoskeletal: Positive for back pain and joint pain. Negative for falls and myalgias.  Skin: Negative for rash.  Neurological: Negative for dizziness, tremors, sensory change, speech change, focal weakness, seizures and headaches.  Endo/Heme/Allergies: Does not bruise/bleed easily.  Psychiatric/Behavioral: Negative for depression and memory loss. The patient is not nervous/anxious DRUG ALLERGIES:   Allergies  Allergen Reactions  . Hydrocodone-Acetaminophen Other (See Comments)    Hallucinations at higher doses  . Lyrica [Pregabalin] Other (See Comments)    dizziness  . Tramadol Other (See Comments)    Hallucinations with higher doses  . Trazodone And Nefazodone Other (See Comments)    tachycardia    VITALS:  Blood pressure (!) 121/58, pulse 61, temperature 98.4 F (36.9 C), temperature source Oral, resp. rate 12, height 5\' 8"  (1.727 m), weight 74.7 kg, SpO2 99 %.  PHYSICAL EXAMINATION:  GENERAL:  84 y.o.-year-old patient lying in the bed with no acute distress.  EYES: Pupils equal, round, reactive to light and  accommodation. No scleral icterus. Extraocular muscles intact.  HEENT: Head atraumatic, normocephalic. Oropharynx and nasopharynx clear.  NECK:  Supple, no jugular venous distention. No thyroid enlargement, no tenderness.  LUNGS: Normal breath sounds bilaterally, no wheezing, rales,rhonchi or crepitation. No use of accessory muscles of respiration.  CARDIOVASCULAR: S1, S2 normal. No murmurs, rubs, or gallops.  ABDOMEN: Soft, nontender, nondistended. Bowel sounds present. No organomegaly or mass.  EXTREMITIES: Bilateral lower extremity edema with the left foot third toe amputation.  Erythema and cyanotic.  No purulent discharge NEUROLOGIC: Cranial nerves II through XII are intact. Muscle strength generalized weakness in all extremities. Sensation intact. Gait not checked.  PSYCHIATRIC: The patient is alert and oriented x 3.  SKIN: No obvious rash, lesion, or ulcer.    LABORATORY PANEL:   CBC Recent Labs  Lab 11/09/17 0439  WBC 9.3  HGB 9.4*  HCT 28.0*  PLT 276   ------------------------------------------------------------------------------------------------------------------  Chemistries  Recent Labs  Lab 11/07/17 1206  11/09/17 0439  NA 139   < > 137  K 5.3*   < > 4.1  CL 104   < > 103  CO2 28   < > 25  GLUCOSE 132*   < > 168*  BUN 48*   < > 38*  CREATININE 1.76*   < > 1.91*  CALCIUM 9.3   < > 8.7*  MG  --    < > 1.7  AST 16  --   --   ALT 13  --   --   ALKPHOS 59  --   --   BILITOT 0.4  --   --    < > = values in this interval not displayed.   ------------------------------------------------------------------------------------------------------------------  Cardiac Enzymes No results for input(s): TROPONINI in the last 168 hours. ------------------------------------------------------------------------------------------------------------------  RADIOLOGY:  No results found.  EKG:   Orders placed or performed during the hospital encounter of 10/29/17  . EKG  test  . EKG test    ASSESSMENT AND PLAN:    * Left foot cellulitis and PAD Patient initially started on IV zosyn and vancomycin, changed to Unasyn  Neg blood cx. Pain medications PRN  * PAD Concern regarding acutely worsening peripheral arterial disease.  Case discussed with vascular surgery.    heparin drip.  Aspirin and Plavix. Scheduled for angiogram on Monday.  N.p.o. Sunday midnight.  The patient has small vessel disease, therefore he might require TMA if symptoms do not improve with anticoagulation and conservative management as per vascular   *Diabetes mellitus.  Continue home dose of insulin.  Sliding scale insulin.  *Hypertension.  Continue home medications  * CKD stage III stable Creatinine 2.0 seems to be his baseline.  Avoid nephrotoxins and monitor renal function closely.    All the records are reviewed and case discussed with Care Management/Social Workerr. Management plans discussed with the patient, family and they are in agreement.  CODE STATUS: DNR  TOTAL TIME TAKING CARE OF THIS PATIENT: 34 minutes.   POSSIBLE D/C IN 2-3  DAYS, DEPENDING ON CLINICAL CONDITION.  Note: This dictation was prepared with Dragon dictation along with smaller phrase technology. Any transcriptional errors that result from this process are unintentional.   Nicholes Mango M.D on 11/09/2017 at 2:31 PM  Between 7am to 6pm - Pager - [phone] After 6pm go to [email or web address] - password [email or web address] [facility]  Office  [phone]  CC: Primary care physician; Crecencio Mc, MD

## 2017-11-09 NOTE — Consult Note (Signed)
ANTICOAGULATION CONSULT NOTE - Initial Consult  Pharmacy Consult for heparin infusion dosing Indication: vascular insufficiency left foot  Allergies  Allergen Reactions  . Hydrocodone-Acetaminophen Other (See Comments)    Hallucinations at higher doses  . Lyrica [Pregabalin] Other (See Comments)    dizziness  . Tramadol Other (See Comments)    Hallucinations with higher doses  . Trazodone And Nefazodone Other (See Comments)    tachycardia    Patient Measurements: Height: 5\' 8"  (172.7 cm) Weight: 164 lb 10.9 oz (74.7 kg) IBW/kg (Calculated) : 68.4 Heparin Dosing Weight: 77.6  Vital Signs: Temp: 98.5 F (36.9 C) (10/06 0553) Temp Source: Oral (10/06 0553) BP: 141/58 (10/06 0553) Pulse Rate: 66 (10/06 0553)  Labs: Recent Labs    11/07/17 1206 11/07/17 1536 11/08/17 0053 11/08/17 0517 11/09/17 0439  HGB 9.7*  --  8.6*  --  9.4*  HCT 30.5*  --  26.3*  --  28.0*  PLT 294  --  262  --  276  APTT  --  32  --   --   --   LABPROT  --  14.2  --   --   --   INR  --  1.11  --   --   --   HEPARINUNFRC  --   --  0.47 0.34 0.48  CREATININE 1.76*  --  2.07*  --  1.91*  CKTOTAL 30*  --   --   --   --     Estimated Creatinine Clearance: 24.4 mL/min (A) (by C-G formula based on SCr of 1.91 mg/dL (H)).   Medical History: Past Medical History:  Diagnosis Date  . Arthritis   . Blood transfusion   . Cancer ([place])    skin cancer on forehead  . Coronary artery disease    h/o bypass coronary- 2001 & peripheral- 2011 , last full cardiac visit  in [place] , [place], [employer]  . Diabetes mellitus   . H/O: malaria 67   in [place]  . Hyperlipidemia   . Hypertension   . Peripheral artery disease ([place])    folowed by [place] bypass 2011 [place]  . Renal insufficiency    by Nov 2012 labs, no old records available    Medications:  No history of prior anticoagulation.  Assessment: 84 y.o. 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  He was seen at the wound center today and the foot looked mottled and concerning for cellulitis and vascular insufficiency and was sent to the ED for further evaluation.  Per vascular surgery recommends heparin. Per ED provider to be dosed per DVT dosing.  Goal of Therapy:  Heparin level 0.3-0.7 units/ml Monitor platelets by anticoagulation protocol: Yes   Plan:  Give 3900 units bolus x 1 Start heparin infusion at 1250 units/hr Will monitor heparin level every 8 hours until therapeutic and then daily. Continue to monitor H&H and platelets  10/05 0100 heparin level 0.47. Continue current regimen. Recheck with AM labs to confirm.  10/05 0500 heparin level 0.34. Continue current regimen. Recheck heparin level and CBC with tomorrow AM labs  10/06 AM heparin level 0.48. Continue current regimen. Recheck heparin level and CBC with tomorrow AM labs  Johnny Latu S, PharmD 11/09/2017,6:36 AM

## 2017-11-10 ENCOUNTER — Telehealth: Payer: Self-pay | Admitting: Internal Medicine

## 2017-11-10 ENCOUNTER — Encounter
Admission: EM | Disposition: A | Discharge status: Home Health | Payer: Self-pay | Source: Ambulatory Visit | Attending: Internal Medicine

## 2017-11-10 DIAGNOSIS — L97929 Non-pressure chronic ulcer of unspecified part of left lower leg with unspecified severity: Secondary | ICD-10-CM

## 2017-11-10 DIAGNOSIS — L03116 Cellulitis of left lower limb: Secondary | ICD-10-CM

## 2017-11-10 DIAGNOSIS — Z89422 Acquired absence of other left toe(s): Secondary | ICD-10-CM

## 2017-11-10 DIAGNOSIS — E1151 Type 2 diabetes mellitus with diabetic peripheral angiopathy without gangrene: Secondary | ICD-10-CM

## 2017-11-10 DIAGNOSIS — I7025 Atherosclerosis of native arteries of other extremities with ulceration: Secondary | ICD-10-CM

## 2017-11-10 DIAGNOSIS — I77811 Abdominal aortic ectasia: Secondary | ICD-10-CM

## 2017-11-10 DIAGNOSIS — I739 Peripheral vascular disease, unspecified: Secondary | ICD-10-CM

## 2017-11-10 HISTORY — PX: LOWER EXTREMITY ANGIOGRAPHY: CATH118251

## 2017-11-10 LAB — CBC
HCT: 29.6 % — ABNORMAL LOW (ref 40.0–52.0)
Hemoglobin: 10 g/dL — ABNORMAL LOW (ref 13.0–18.0)
MCH: 32 pg (ref 26.0–34.0)
MCHC: 33.7 g/dL (ref 32.0–36.0)
MCV: 94.7 fL (ref 80.0–100.0)
PLATELETS: 291 10*3/uL (ref 150–440)
RBC: 3.13 MIL/uL — AB (ref 4.40–5.90)
RDW: 14.8 % — ABNORMAL HIGH (ref 11.5–14.5)
WBC: 7.2 10*3/uL (ref 3.8–10.6)

## 2017-11-10 LAB — GLUCOSE, CAPILLARY
GLUCOSE-CAPILLARY: 157 mg/dL — AB (ref 70–99)
GLUCOSE-CAPILLARY: 97 mg/dL (ref 70–99)
Glucose-Capillary: 119 mg/dL — ABNORMAL HIGH (ref 70–99)
Glucose-Capillary: 118 mg/dL — ABNORMAL HIGH (ref 70–99)
Glucose-Capillary: 188 mg/dL — ABNORMAL HIGH (ref 70–99)

## 2017-11-10 LAB — BASIC METABOLIC PANEL
Anion gap: 13 (ref 5–15)
BUN: 32 mg/dL — ABNORMAL HIGH (ref 8–23)
CO2: 24 mmol/L (ref 22–32)
Calcium: 8.7 mg/dL — ABNORMAL LOW (ref 8.9–10.3)
Chloride: 102 mmol/L (ref 98–111)
Creatinine, Ser: 1.94 mg/dL — ABNORMAL HIGH (ref 0.61–1.24)
GFR calc Af Amer: 33 mL/min — ABNORMAL LOW (ref >60)
GFR calc non Af Amer: 29 mL/min — ABNORMAL LOW (ref >60)
Glucose, Bld: 124 mg/dL — ABNORMAL HIGH (ref 70–99)
Potassium: 4 mmol/L (ref 3.5–5.1)
Sodium: 139 mmol/L (ref 135–145)

## 2017-11-10 LAB — MAGNESIUM: MAGNESIUM: 1.7 mg/dL (ref 1.7–2.4)

## 2017-11-10 LAB — HEPARIN LEVEL (UNFRACTIONATED): Heparin Unfractionated: 0.59 IU/mL (ref 0.30–0.70)

## 2017-11-10 SURGERY — LOWER EXTREMITY ANGIOGRAPHY
Anesthesia: Moderate Sedation | Laterality: Left

## 2017-11-10 MED ORDER — HEPARIN SODIUM (PORCINE) 1000 UNIT/ML IJ SOLN
INTRAMUSCULAR | Status: DC | PRN
  Administered 2017-11-10: 4000 [IU] via INTRAVENOUS

## 2017-11-10 MED ORDER — FENTANYL CITRATE (PF) 100 MCG/2ML IJ SOLN
INTRAMUSCULAR | Status: AC
  Filled 2017-11-10: qty 2

## 2017-11-10 MED ORDER — SODIUM CHLORIDE 0.9 % IV SOLN: INTRAVENOUS | Status: DC

## 2017-11-10 MED ORDER — LIDOCAINE-EPINEPHRINE (PF) 1 %-1:200000 IJ SOLN
INTRAMUSCULAR | Status: AC
  Filled 2017-11-10: qty 30

## 2017-11-10 MED ORDER — CEFAZOLIN SODIUM-DEXTROSE 2-4 GM/100ML-% IV SOLN
2.0000 g | Freq: Once | INTRAVENOUS | Status: AC
  Administered 2017-11-10: 2 g via INTRAVENOUS

## 2017-11-10 MED ORDER — MIDAZOLAM HCL 2 MG/2ML IJ SOLN
INTRAMUSCULAR | Status: DC | PRN
  Administered 2017-11-10: 1 mg via INTRAVENOUS

## 2017-11-10 MED ORDER — FENTANYL CITRATE (PF) 100 MCG/2ML IJ SOLN
INTRAMUSCULAR | Status: DC | PRN
  Administered 2017-11-10: 25 ug via INTRAVENOUS

## 2017-11-10 MED ORDER — MIDAZOLAM HCL 5 MG/5ML IJ SOLN
INTRAMUSCULAR | Status: AC
  Filled 2017-11-10: qty 5

## 2017-11-10 MED ORDER — IOPAMIDOL (ISOVUE-300) INJECTION 61%
INTRAVENOUS | Status: DC | PRN
  Administered 2017-11-10: 45 mL via INTRA_ARTERIAL

## 2017-11-10 MED ORDER — HEPARIN SODIUM (PORCINE) 1000 UNIT/ML IJ SOLN
INTRAMUSCULAR | Status: AC
  Filled 2017-11-10: qty 1

## 2017-11-10 MED ORDER — HEPARIN (PORCINE) IN NACL 1000-0.9 UT/500ML-% IV SOLN
INTRAVENOUS | Status: AC
  Filled 2017-11-10: qty 1000

## 2017-11-10 SURGICAL SUPPLY — 19 items
CATH BEACON 5 .035 65 KMP TIP (CATHETERS) ×3 IMPLANT
CATH CXI SUPP ANG 4FR 135 (CATHETERS) ×1 IMPLANT
CATH CXI SUPP ANG 4FR 135CM (CATHETERS) ×3
CATH PIG 70CM (CATHETERS) ×3 IMPLANT
CATH SEEKER .035X90 (CATHETERS) ×3 IMPLANT
CATH VERT 5FR 125CM (CATHETERS) ×3 IMPLANT
CATH VS15FR (CATHETERS) ×3 IMPLANT
DEVICE STARCLOSE SE CLOSURE (Vascular Products) ×3 IMPLANT
DEVICE TORQUE .025-.038 (MISCELLANEOUS) ×3 IMPLANT
PACK ANGIOGRAPHY (CUSTOM PROCEDURE TRAY) ×3 IMPLANT
SHEATH ANL 5FRX45 (SHEATH) ×3 IMPLANT
SHEATH ANL 5FRX90 (SHEATH) ×3 IMPLANT
SHEATH BRITE TIP 5FRX11 (SHEATH) ×3 IMPLANT
SHEATH BRITE TIP 6FR X 23 (SHEATH) ×3 IMPLANT
SYR MEDRAD MARK V 150ML (SYRINGE) ×3 IMPLANT
TUBING CONTRAST HIGH PRESS 72 (TUBING) ×3 IMPLANT
WIRE AMPLATZ SSTIFF .035X260CM (WIRE) ×3 IMPLANT
WIRE AQUATRACK .035X260CM (WIRE) ×3 IMPLANT
WIRE J 3MM .035X145CM (WIRE) ×3 IMPLANT

## 2017-11-10 NOTE — Progress Notes (Signed)
Dr. Lucky Cowboy at bedside speaking extensively with pt. And his family (daughter & son) re: procedural results. All parties involved verbalize understanding of discussion.

## 2017-11-10 NOTE — Progress Notes (Signed)
Per Dr. Lucky Cowboy okay for RN to DC heparin drip.

## 2017-11-10 NOTE — Op Note (Signed)
[facility]  Percutaneous Study/Intervention Procedural Note   Date of Surgery: 11/10/2017  Surgeon(s):DEW,JASON    Assistants:none  Pre-operative Diagnosis: PAD with ulceration and recent cellulitis of the left foot  Post-operative diagnosis:  Same  Procedure(s) Performed:             1.  Ultrasound guidance for vascular access right femoral artery             2.  Catheter placement into left superficial femoral artery from right femoral approach             3.  Aortogram and selective left lower extremity angiogram             4.  StarClose closure device femoral artery  EBL: 10 cc  Contrast: 45 cc  Fluoro Time: 6.2 minutes  Moderate Conscious Sedation Time: approximately 25 minutes using 1 mg of Versed and 25 mcg of Fentanyl              Indications:  Patient is a 84 y.o.male with recent cellulitis of his left foot.  He has undergone a left third toe amputation and there is concern for further infection in his toes.  His toe amputation site is healing slowly. The patient is brought in for angiography for further evaluation and potential treatment.  Due to the limb threatening nature of the situation, angiogram was performed for attempted limb salvage. The patient is aware that if the procedure fails, amputation would be expected.  The patient also understands that even with successful revascularization, amputation may still be required due to the severity of the situation. Risks and benefits are discussed and informed consent is obtained.   Procedure:  The patient was identified and appropriate procedural time out was performed.  The patient was then placed supine on the table and prepped and draped in the usual sterile fashion. Moderate conscious sedation was administered during a face to face encounter with the patient throughout the procedure with my supervision of the RN administering medicines and monitoring the patient's vital signs, pulse oximetry,  telemetry and mental status throughout from the start of the procedure until the patient was taken to the recovery room. Ultrasound was used to evaluate the right common femoral artery.  It was patent .  A digital ultrasound image was acquired.  A Seldinger needle was used to access the right common femoral artery under direct ultrasound guidance and a permanent image was performed.  A 0.035 J wire was advanced without resistance and a 5Fr sheath was placed.  Pigtail catheter was placed into the aorta and an AP aortogram was performed. This demonstrated ectasia of the infrarenal aorta without stenosis.  The previously placed iliac stents were patent.  The renal artery flow appeared normal. I then crossed the aortic bifurcation and advanced to the left femoral head.  Across the aortic bifurcation with a VS 1 catheter due to the previously placed iliac stents this was a little more difficult than typical.  To help opacify the distal flow, the VS 1 catheter was advanced into the proximal to mid left superficial femoral artery for the distal imaging.  Selective left lower extremity angiogram was then performed. This demonstrated calcified but not stenotic common femoral artery, profunda femoris artery, superficial femoral artery, and popliteal arteries.  As known previously, the peroneal artery was the only runoff distally and this terminated just above the normal split at the ankle.  There were some significant collaterals down into the foot  although significant small vessel disease was identified.  This was a scenario where we were very unlikely to improve anything from a large vessel standpoint.  His distal peroneal artery may have occluded at the typical bifurcation of the peroneal artery, but there were a significant amount of collaterals already and there was really no further the target for revascularization that would seem reasonable.  This was improved from his procedure several weeks ago where it was occluded  further up in the peroneal artery in that area appeared patent.  As such, there was no role for endovascular repair edition in the left leg.  I elected to terminate the procedure. The sheath was removed and StarClose closure device was deployed in the right femoral artery with excellent hemostatic result. The patient was taken to the recovery room in stable condition having tolerated the procedure well.  Findings:               Aortogram:  Ectasia of the infrarenal aorta without stenosis.  The previously placed iliac stents were patent.  The renal artery flow appeared normal.             Left lower Extremity:  Calcified but not stenotic common femoral artery, profunda femoris artery, superficial femoral artery, and popliteal arteries.  As known previously, the peroneal artery was the only runoff distally and this terminated just above the normal split at the ankle.  There were some significant collaterals down into the foot although significant small vessel disease was identified.   Disposition: Patient was taken to the recovery room in stable condition having tolerated the procedure well.  Complications: None  Leotis Pain 11/10/2017 4:25 PM   This note was created with Dragon Medical transcription system. Any errors in dictation are purely unintentional.

## 2017-11-10 NOTE — Evaluation (Signed)
Physical Therapy Evaluation Patient Details Name: [name] MRN: [id number] DOB: 08/13/1924 Today's Date: 11/10/2017   History of Present Illness  presented to ER secondary to increased L LE pain, swelling and tenderness; admitted for management of L foot cellulitis.  Of note, recent L third toe amputation (approx 2 weeks prior); scheduled for angiogram this date to assess vasculature.  Noted discussion of potential TMA if site not improved conservatively.  Clinical Impression  Upon evaluation, patient alert and oriented; follows commands and demonstrates fair/good effort with functional tasks.  Rates resting pain in bilat feet (L < R), 3/10; improved to 0/10 after movement and functional activity.  Assist from therapist to don bilat offloading shoes (brought from home); attempted to pad dorsal surface of L foot for improved tolerance/pain control.  Able to complete bed mobility with min assist; sit/stand, basic transfers and gait (180') with RW, cga/close sup.  Decreased cadence, but steady gait performance, without buckling or LOB.  Completes 10' walk time, 10-11 seconds; appropriate for household ambulatory, but do recommend continued use of RW for all activities at this time. Would benefit from skilled PT to address above deficits and promote optimal return to PLOF; Recommend transition to [place] upon discharge from acute hospitalization.     Follow Up Recommendations Home health PT    Equipment Recommendations  (has RW)    Recommendations for Other Services       Precautions / Restrictions Precautions Precautions: Fall Precaution Comments: bilat offloading shoes  Restrictions Weight Bearing Restrictions: No      Mobility  Bed Mobility Overal bed mobility: Needs Assistance Bed Mobility: Supine to Sit     Supine to sit: Min assist     General bed mobility comments: for truncal elevation  Transfers Overall transfer level: Needs assistance Equipment used: Rolling  walker (2 wheeled) Transfers: Sit to/from Stand Sit to Stand: Min assist         General transfer comment: cuing fof hand placement to prevent pulling on RW  Ambulation/Gait Ambulation/Gait assistance: Min guard Gait Distance (Feet): 180 Feet Assistive device: Rolling walker (2 wheeled)   Gait velocity: 10' walk time, 10-11 seconds Gait velocity interpretation: <1.31 ft/sec, indicative of household ambulator General Gait Details: partially reciprocal stepping pattern, limited by presence of bilat offloading shoes (rigid surface, prevents heel strike/toe off).  Slow, but steady, cadence without buckling or LOB  Stairs            Wheelchair Mobility    Modified Rankin (Stroke Patients Only)       Balance Overall balance assessment: Needs assistance Sitting-balance support: No upper extremity supported;Feet supported Sitting balance-Leahy Scale: Good     Standing balance support: Bilateral upper extremity supported Standing balance-Leahy Scale: Fair                               Pertinent Vitals/Pain Pain Assessment: 0-10 Pain Score: 3  Pain Location: L foot Pain Descriptors / Indicators: Aching Pain Intervention(s): Limited activity within patient's tolerance;Monitored during session;Repositioned    Home Living Family/patient expects to be discharged to:: Private residence Living Arrangements: Alone Available Help at Discharge: Family;Available PRN/intermittently Type of Home: Apartment Home Access: Level entry     Home Layout: One level        Prior Function Level of Independence: Independent         Comments: Indep with ADLs, household and community mobilization without assist device; recent use of RW since  surgery approx two weeks prior.  + driving; denies fall history.  Was actively participting with HHPT immediately prior to admission.     Hand Dominance        Extremity/Trunk Assessment   Upper Extremity Assessment Upper  Extremity Assessment: Overall WFL for tasks assessed    Lower Extremity Assessment Lower Extremity Assessment: Overall WFL for tasks assessed(grossly at least 4/5 throughout; baseline paresthesia mid-foot distally bilat (unchanged))       Communication   Communication: No difficulties  Cognition Arousal/Alertness: Awake/alert Behavior During Therapy: WFL for tasks assessed/performed;Flat affect Overall Cognitive Status: Within Functional Limits for tasks assessed                                        General Comments      Exercises     Assessment/Plan    PT Assessment Patient needs continued PT services  PT Problem List Decreased strength;Decreased activity tolerance;Decreased balance;Decreased mobility;Decreased knowledge of precautions;Pain;Decreased skin integrity       PT Treatment Interventions DME instruction;Gait training    PT Goals (Current goals can be found in the Care Plan section)  Acute Rehab PT Goals Patient Stated Goal: to return home; would like vascular surgeon to see foot prior to procedure this date (RN, Therapist, sports aware) PT Goal Formulation: With patient/family Time For Goal Achievement: 11/24/17 Potential to Achieve Goals: Good    Frequency Min 2X/week   Barriers to discharge Decreased caregiver support      Co-evaluation               AM-PAC PT "6 Clicks" Daily Activity  Outcome Measure Difficulty turning over in bed (including adjusting bedclothes, sheets and blankets)?: A Little Difficulty moving from lying on back to sitting on the side of the bed? : A Little Difficulty sitting down on and standing up from a chair with arms (e.g., wheelchair, bedside commode, etc,.)?: Unable Help needed moving to and from a bed to chair (including a wheelchair)?: A Little Help needed walking in hospital room?: A Little Help needed climbing 3-5 steps with a railing? : A Little 6 Click Score: 16    End of Session Equipment  Utilized During Treatment: Gait belt Activity Tolerance: Patient tolerated treatment well Patient left: in chair;with call bell/phone within reach;with chair alarm set;with family/visitor present Nurse Communication: Mobility status PT Visit Diagnosis: Difficulty in walking, not elsewhere classified (R26.2);Pain;Other abnormalities of gait and mobility (R26.89) Pain - Right/Left: Left Pain - part of body: Ankle and joints of foot    Time: 0569-7948 PT Time Calculation (min) (ACUTE ONLY): 16 min   Charges:   PT Evaluation $PT Eval Low Complexity: 1 Low PT Treatments $Gait Training: 8-22 mins       Moselle Rister H. Owens Shark, PT, DPT, NCS 11/10/17, 10:43 AM [phone]

## 2017-11-10 NOTE — Telephone Encounter (Signed)
Copied from [place] [id number]. Topic: Quick Communication - See Telephone Encounter >> Nov 10, 2017  9:17 AM Ivar Drape wrote: CRM for notification. See Telephone encounter for: 11/10/17. Patient has been in the hospital since Friday, 11/07/17 and his daughter, Arvid Right, would like to talk to the patient's provider as soon as she can about his condition.  Please call [phone]

## 2017-11-10 NOTE — Care Management Note (Signed)
Case Management Note  Patient Details  Name: [name] MRN: [id number] Date of Birth: 21-Feb-1924   Patient admitted from home with cellulitis and PAD.  Daughter at bedside for assessment.  Patient lives at home alone.  Daughter states that her and her brother are available for support, however they live in different counties.   Patient has PCS services through [facility] providers for 2 hours a day Monday-Friday. Patient is open with [facility] for RN, and PT.  Patient is open with [facility] for outpatient patient palliative. PCP [facility].  Pharmacy [facility].  Patient denies any issues obtaining his medication.  Patient lives on the independent campus of [facility].  Patient states that the facility will provide transportation at times, and his children will also take him to appointments.   PT has assessed patient and recommends home health PT.  Patient states that he has a RW, and RW in the home.    Plan for today angiogram with possible intervention which would most likely be diagnostic for possible TMA versus BKA.  Daughter is concerned that if that patient does have further surgery this admission he will not be able to return home.  Daughter is aware that currently the recommendations are for home health, and patient would have to be reevaluated when/if that procedure takes place. Daughter states that should the patient have to be placed they do no want to pursue [facility].  [place].  RNCM following.  Subjective/Objective:                    Action/Plan:   Expected Discharge Date:                  Expected Discharge Plan:     In-House Referral:     Discharge planning Services  CM Consult  Post Acute Care Choice:  Resumption of Svcs/PTA Provider, Home Health Choice offered to:     DME Arranged:    DME Agency:     HH Arranged:    HH Agency:  [facility]  Status of Service:   In process, will continue to follow  If discussed at Long Length of Stay Meetings, dates discussed:    Additional Comments:  Beverly Sessions, RN 11/10/2017, 12:24 PM

## 2017-11-10 NOTE — Consult Note (Signed)
ANTICOAGULATION CONSULT NOTE - Initial Consult  Pharmacy Consult for heparin infusion dosing Indication: vascular insufficiency left foot  Allergies  Allergen Reactions  . Hydrocodone-Acetaminophen Other (See Comments)    Hallucinations at higher doses  . Lyrica [Pregabalin] Other (See Comments)    dizziness  . Tramadol Other (See Comments)    Hallucinations with higher doses  . Trazodone And Nefazodone Other (See Comments)    tachycardia    Patient Measurements: Height: 5\' 8"  (172.7 cm) Weight: 156 lb 12 oz (71.1 kg) IBW/kg (Calculated) : 68.4 Heparin Dosing Weight: 77.6  Vital Signs: Temp: 97.6 F (36.4 C) (10/07 0428) Temp Source: Oral (10/07 0428) BP: 142/54 (10/07 0428) Pulse Rate: 84 (10/07 0428)  Labs: Recent Labs    11/07/17 1206 11/07/17 1536  11/08/17 0053 11/08/17 0517 11/09/17 0439 11/10/17 0402  HGB 9.7*  --   --  8.6*  --  9.4* 10.0*  HCT 30.5*  --   --  26.3*  --  28.0* 29.6*  PLT 294  --   --  262  --  276 291  APTT  --  32  --   --   --   --   --   LABPROT  --  14.2  --   --   --   --   --   INR  --  1.11  --   --   --   --   --   HEPARINUNFRC  --   --    < > 0.47 0.34 0.48 0.59  CREATININE 1.76*  --   --  2.07*  --  1.91* 1.94*  CKTOTAL 30*  --   --   --   --   --   --    < > = values in this interval not displayed.    Estimated Creatinine Clearance: 24 mL/min (A) (by C-G formula based on SCr of 1.94 mg/dL (H)).   Medical History: Past Medical History:  Diagnosis Date  . Arthritis   . Blood transfusion   . Cancer ([place])    skin cancer on forehead  . Coronary artery disease    h/o bypass coronary- 2001 & peripheral- 2011 , last full cardiac visit  in [place] , [place], [employer]  . Diabetes mellitus   . H/O: malaria 49   in [place]  . Hyperlipidemia   . Hypertension   . Peripheral artery disease ([place])    folowed by [place] post bypass 2011 [place]  . Renal insufficiency    by Nov 2012 labs, no old records available     Medications:  No history of prior anticoagulation.  Assessment: 84 y.o. 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  He was seen at the wound center today and the foot looked mottled and concerning for cellulitis and vascular insufficiency and was sent to the ED for further evaluation.  Per vascular surgery recommends heparin. Per ED provider to be dosed per DVT dosing.  Goal of Therapy:  Heparin level 0.3-0.7 units/ml Monitor platelets by anticoagulation protocol: Yes   Plan:  Give 3900 units bolus x 1 Start heparin infusion at 1250 units/hr Will monitor heparin level every 8 hours until therapeutic and then daily. Continue to monitor H&H and platelets  10/05 0100 heparin level 0.47. Continue current regimen. Recheck with AM labs to confirm.  10/05 0500 heparin level 0.34. Continue current regimen. Recheck heparin level and CBC with tomorrow AM labs  10/06 AM heparin level 0.48. Continue current regimen. Recheck heparin level and CBC with tomorrow AM labs  10/07 AM heparin level 0.59. Continue current regimen. Recheck heparin level and CBC with tomorrow AM labs   Malissie Musgrave S, PharmD 11/10/2017,6:34 AM

## 2017-11-10 NOTE — Care Management Important Message (Signed)
Copy of signed IM left with patient in room.  

## 2017-11-10 NOTE — Progress Notes (Addendum)
Per MD okay for pt to have clear liquids for breakfast and NPO after.

## 2017-11-10 NOTE — H&P (Signed)
[facility] History & Physical Update  The patient was interviewed and re-examined.  The patient's previous History and Physical has been reviewed and is unchanged.  There is no change in the plan of care. We plan to proceed with the scheduled procedure.  Leotis Pain, MD  11/10/2017, 2:23 PM

## 2017-11-10 NOTE — Telephone Encounter (Signed)
Patients daughter requesting a call back

## 2017-11-10 NOTE — Progress Notes (Signed)
[place] [facility] [place] NAME: [name]    MR#:  [id number]  DATE OF BIRTH:  [birth year]  SUBJECTIVE:  CHIEF COMPLAINT: Patient is resting comfortably.  Awaiting for angiogram today.  Daughter at bedside  REVIEW OF SYSTEMS:   Review of Systems  Constitutional: Positive for malaise/fatigue. Negative for chills, fever and weight loss.  HENT: Negative for hearing loss and nosebleeds.   Eyes: Negative for blurred vision, double vision and pain.  Respiratory: Negative for cough, hemoptysis, sputum production, shortness of breath and wheezing.   Cardiovascular: Positive for leg swelling. Negative for chest pain, palpitations and orthopnea.  Gastrointestinal: Negative for abdominal pain, constipation, diarrhea, nausea and vomiting.  Genitourinary: Negative for dysuria and hematuria.  Musculoskeletal: Positive for back pain and joint pain. Negative for falls and myalgias.  Skin: Negative for rash.  Neurological: Negative for dizziness, tremors, sensory change, speech change, focal weakness, seizures and headaches.  Endo/Heme/Allergies: Does not bruise/bleed easily.  Psychiatric/Behavioral: Negative for depression and memory loss. The patient is not nervous/anxious DRUG ALLERGIES:   Allergies  Allergen Reactions  . Hydrocodone-Acetaminophen Other (See Comments)    Hallucinations at higher doses  . Lyrica [Pregabalin] Other (See Comments)    dizziness  . Tramadol Other (See Comments)    Hallucinations with higher doses  . Trazodone And Nefazodone Other (See Comments)    tachycardia    VITALS:  Blood pressure 140/62, pulse 76, temperature 98.5 F (36.9 C), temperature source Oral, resp. rate 16, height 5\' 8"  (1.727 m), weight 71.1 kg, SpO2 99 %.  PHYSICAL EXAMINATION:  GENERAL:  84 y.o.-year-old patient lying in the bed with no acute distress.  EYES: Pupils equal, round, reactive to light and accommodation. No scleral icterus.  Extraocular muscles intact.  HEENT: Head atraumatic, normocephalic. Oropharynx and nasopharynx clear.  NECK:  Supple, no jugular venous distention. No thyroid enlargement, no tenderness.  LUNGS: Normal breath sounds bilaterally, no wheezing, rales,rhonchi or crepitation. No use of accessory muscles of respiration.  CARDIOVASCULAR: S1, S2 normal. No murmurs, rubs, or gallops.  ABDOMEN: Soft, nontender, nondistended. Bowel sounds present.  EXTREMITIES: Bilateral lower extremity edema with the left foot third toe amputation.  Erythema and cyanotic.  No purulent discharge NEUROLOGIC: Cranial nerves II through XII are intact. Muscle strength generalized weakness in all extremities. Sensation intact. Gait not checked.  PSYCHIATRIC: The patient is alert and oriented x 3.  SKIN: No obvious rash, lesion, or ulcer.    LABORATORY PANEL:   CBC Recent Labs  Lab 11/10/17 0402  WBC 7.2  HGB 10.0*  HCT 29.6*  PLT 291   ------------------------------------------------------------------------------------------------------------------  Chemistries  Recent Labs  Lab 11/07/17 1206  11/10/17 0402  NA 139   < > 139  K 5.3*   < > 4.0  CL 104   < > 102  CO2 28   < > 24  GLUCOSE 132*   < > 124*  BUN 48*   < > 32*  CREATININE 1.76*   < > 1.94*  CALCIUM 9.3   < > 8.7*  MG  --    < > 1.7  AST 16  --   --   ALT 13  --   --   ALKPHOS 59  --   --   BILITOT 0.4  --   --    < > = values in this interval not displayed.   ------------------------------------------------------------------------------------------------------------------  Cardiac Enzymes No results for input(s): TROPONINI in the last 168  hours. ------------------------------------------------------------------------------------------------------------------  RADIOLOGY:  No results found.  EKG:   Orders placed or performed during the hospital encounter of 10/29/17  . EKG test  . EKG test    ASSESSMENT AND PLAN:    * Left foot  cellulitis and PAD Patient initially started on IV zosyn and vancomycin, changed to Unasyn  Neg blood cx. Pain medications PRN  * PAD Concern regarding acutely worsening peripheral arterial disease.  Case discussed with vascular surgery.    heparin drip.  Aspirin and Plavix. Scheduled for angiogram at 6 PM today.  N.p.o.  The patient has small vessel disease, therefore he might require TMA if symptoms do not improve with anticoagulation and conservative management as per vascular   *Diabetes mellitus.  Continue home dose of insulin.  Sliding scale insulin.  *Hypertension.  Continue home medications  * CKD stage III stable Creatinine 2.0 seems to be his baseline.  Avoid nephrotoxins and monitor renal function closely.    All the records are reviewed and case discussed with Care Management/Social Workerr. Management plans discussed with the patient, DAUGHTER and they are in agreement.  CODE STATUS: DNR  TOTAL TIME TAKING CARE OF THIS PATIENT: 34 minutes.   POSSIBLE D/C IN 2-3  DAYS, DEPENDING ON CLINICAL CONDITION.  Note: This dictation was prepared with Dragon dictation along with smaller phrase technology. Any transcriptional errors that result from this process are unintentional.   Nicholes Mango M.D on 11/10/2017 at 2:18 PM  Between 7am to 6pm - Pager - [phone] After 6pm go to [email or web address] - password [email or web address] [facility]  Office  [phone]  CC: Primary care physician; Crecencio Mc, MD

## 2017-11-10 NOTE — Progress Notes (Addendum)
[facility]  Daily Progress Note   Left Lower Extremity Angiogram (11/10/17): "Calcified but not stenotic common femoral artery, profunda femoris artery, superficial femoral artery, and popliteal arteries. As known previously, the peroneal artery was the only runoff distally and this terminated just above the normal split at the ankle. There were some significant collaterals down into the foot although significant small vessel disease was identified."  Recommend: Two weeks PO ABX. Will see in office on 11/19/17 as outpatient for amputation site check / suture removal. Patient should follow up with wound center as per his normal routine.  OK from vascular surgery standpoint to discharge home on Tuesday. Vascular Surgery will sign off at this time. Please reconsult if needed.   Discussed with [facility].  Marcelle Overlie PA-C 11/10/2017 5:40 PM

## 2017-11-11 ENCOUNTER — Encounter: Payer: Self-pay | Admitting: Vascular Surgery

## 2017-11-11 LAB — GLUCOSE, CAPILLARY
Glucose-Capillary: 156 mg/dL — ABNORMAL HIGH (ref 70–99)
Glucose-Capillary: 195 mg/dL — ABNORMAL HIGH (ref 70–99)

## 2017-11-11 MED ORDER — INSULIN ASPART 100 UNIT/ML ~~LOC~~ SOLN: 0.0000 [IU] | Freq: Three times a day (TID) | SUBCUTANEOUS | 11 refills | Status: DC

## 2017-11-11 MED ORDER — ACETAMINOPHEN 325 MG PO TABS: 650.0000 mg | ORAL_TABLET | Freq: Four times a day (QID) | ORAL | Status: AC | PRN

## 2017-11-11 MED ORDER — OCUVITE-LUTEIN PO CAPS
1.0000 | ORAL_CAPSULE | Freq: Every day | ORAL | Status: DC
  Administered 2017-11-11: 1 via ORAL
  Filled 2017-11-11: qty 1

## 2017-11-11 MED ORDER — POLYETHYLENE GLYCOL 3350 17 G PO PACK: 17.0000 g | PACK | Freq: Every day | ORAL | 0 refills | Status: AC | PRN

## 2017-11-11 MED ORDER — TRAMADOL HCL 50 MG PO TABS: 50.0000 mg | ORAL_TABLET | Freq: Two times a day (BID) | ORAL | 0 refills | Status: DC

## 2017-11-11 MED ORDER — INSULIN ASPART PROT & ASPART (70-30 MIX) 100 UNIT/ML PEN: PEN_INJECTOR | SUBCUTANEOUS | 1 refills | Status: AC

## 2017-11-11 MED ORDER — PREMIER PROTEIN SHAKE: 11.0000 [oz_av] | Freq: Two times a day (BID) | ORAL | 0 refills | Status: AC

## 2017-11-11 MED ORDER — INSULIN ASPART 100 UNIT/ML ~~LOC~~ SOLN: 0.0000 [IU] | Freq: Every day | SUBCUTANEOUS | 11 refills | Status: DC

## 2017-11-11 MED ORDER — HYDROCODONE-ACETAMINOPHEN 10-325 MG PO TABS: 1.0000 | ORAL_TABLET | Freq: Four times a day (QID) | ORAL | 0 refills | Status: DC | PRN

## 2017-11-11 MED ORDER — CEFDINIR 300 MG PO CAPS: 300.0000 mg | ORAL_CAPSULE | Freq: Two times a day (BID) | ORAL | 0 refills | Status: AC

## 2017-11-11 MED ORDER — PREMIER PROTEIN SHAKE: 11.0000 [oz_av] | Freq: Two times a day (BID) | ORAL | Status: DC

## 2017-11-11 MED ORDER — CEFDINIR 300 MG PO CAPS
300.0000 mg | ORAL_CAPSULE | Freq: Two times a day (BID) | ORAL | Status: DC
  Administered 2017-11-11: 300 mg via ORAL
  Filled 2017-11-11 (×2): qty 1

## 2017-11-11 NOTE — Care Management Note (Signed)
Case Management Note  Patient Details  Name: [name] MRN: [id number] Date of Birth: 07/18/1924   Patient to discharge today.  Joelene Millin with [facility] notified of discharge. Santiago Glad with [facility] notified of discharge.  RNCM signing off.   Subjective/Objective:                    Action/Plan:   Expected Discharge Date:  11/11/17               Expected Discharge Plan:     In-House Referral:     Discharge planning Services  CM Consult  Post Acute Care Choice:  Resumption of Svcs/PTA Provider, Home Health Choice offered to:     DME Arranged:    DME Agency:     HH Arranged:  PT, RN, Social Work [employer] Agency:  [facility]  Status of Service:  Completed, signed off  If discussed at [employer], dates discussed:    Additional Comments:  Beverly Sessions, RN 11/11/2017, 1:41 PM

## 2017-11-11 NOTE — Telephone Encounter (Signed)
Spoke with Kathlee Nations pt's daughter and she stated that her dad has been in the hospital since Friday 11/07/2017. He sent there from wound care because when he went in for the appt the doctor looked at his foot and said that it needed to come off. Daughter stated that they admitted the the pt later that day but that he didn't see a doctor all weekend and then on Monday a vascular doctor came in and stated that his foot did not need to come off. The family is concerned that the pt is not getting the best vascular care that he could be getting. They are wanting to know if a referral could be put in to a vascular doctor at both [facility] and [facility] so they can get a second opinion.

## 2017-11-11 NOTE — Telephone Encounter (Signed)
Spoke with Kathlee Nations and informed her of the information below. Kathlee Nations stated that she is looking forward to talking with Dr. Derrel Nip later this afternoon.

## 2017-11-11 NOTE — Telephone Encounter (Signed)
Pt is in the hospital.

## 2017-11-11 NOTE — Telephone Encounter (Signed)
Will speak with daughter when I return to the office this afternoon,  Please let her know that  I will make a referral to either or both places,  But she has to understand that I cannot control WHEN they will see him because he is a new patient and  is already under the care of a vascular surgeon with an excellent reputation   The only patients that are seen URGENTLY are those who show  Up in the ER at [facility] or at [facility].

## 2017-11-11 NOTE — Discharge Instructions (Signed)
Follow-up with primary care physician at the facility in 3 to 4 days Follow-up with vascular surgery as scheduled on November 19, 2017 Resume home health and wound care

## 2017-11-11 NOTE — Telephone Encounter (Signed)
LMTCB. Please transfer pt's daughter [name] to our office.

## 2017-11-11 NOTE — Discharge Summary (Signed)
[name] at [facility] NAME: [name]    MR#:  [id number]  DATE OF BIRTH:  [birth year]  DATE OF ADMISSION:  11/07/2017 ADMITTING PHYSICIAN: Hillary Bow, MD  DATE OF DISCHARGE: 11/11/17   PRIMARY CARE PHYSICIAN: Crecencio Mc, MD    ADMISSION DIAGNOSIS:  Cellulitis of left foot [L03.116] Vascular insufficiency of extremity [I99.8]  DISCHARGE DIAGNOSIS:  Active Problems:   Cellulitis of left foot Vascular insufficiency  SECONDARY DIAGNOSIS:   Past Medical History:  Diagnosis Date  . Arthritis   . Blood transfusion   . Cancer ([place])    skin cancer on forehead  . Coronary artery disease    h/o bypass coronary- 2001 & peripheral- 2011 , last full cardiac visit  in [place] , [place], [employer]  . Diabetes mellitus   . H/O: malaria 70   in [place]  . Hyperlipidemia   . Hypertension   . Peripheral artery disease ([place])    folowed by [place] bypass 2011 [place]  . Renal insufficiency    by Nov 2012 labs, no old records available    HOSPITAL COURSE:   HPI  [name]  is a 84 y.o. male with a known history of [place] mellitus, hypertension, peripheral arterial disease who recently had left third toe amputation done presents to the emergency room due to increasing pain in his left leg along with swelling and redness.  Also found to have cyanotic changes and started on heparin drip and IV antibiotics in the emergency room.  Vascular surgery consult and and has seen the patient. History obtained from son at bedside.  Patient defers history and decision-making to son. Pain has worsened.  Patient is on aspirin and Plavix at home. Was on oral antibiotics 2 weeks back.   * Left foot cellulitis and PAD Patient initially started on IV zosyn and vancomycin, changed to Unasyn given during the hospital course.  Patient has small vessel disease which is compromising the circulation and wound healing Status post  angiogram, vascular is recommending to discharge patient with p.o. antibiotics for 2 weeks and outpatient follow-up in a week on October 16 to check the wound and might consider amputation at that point if it is not healing well Neg blood cx. Pain medications PRN  * PAD Concern regarding acutely worsening peripheral arterial disease. Case discussed with vascular surgery.   heparin drip given during the hospital course will discontinue the same.  Continueapirin and Plavix. Angiogram left lower extremity on 11/10/2017 :"Calcified but not stenotic common femoral artery, profunda femoris artery, superficial femoral artery, and popliteal arteries. As known previously, the peroneal artery was the only runoff distally and this terminated just above the normal split at the ankle. There were some significant collaterals down into the foot although significant small vessel disease was identified."   *Diabetes mellitus. Continue home dose of insulin. Sliding scale insulin.  *Hypertension. Continue home medications  * CKD stage III stable Creatinine 2.0 seems to be his baseline.  Avoid nephrotoxins and monitor renal function closely. DISCHARGE CONDITIONS:   Fair  CONSULTS OBTAINED:  Treatment Team:  Sela Hua, PA-C   PROCEDURES angiograms  DRUG ALLERGIES:   Allergies  Allergen Reactions  . Hydrocodone-Acetaminophen Other (See Comments)    Hallucinations at higher doses  . Lyrica [Pregabalin] Other (See Comments)    dizziness  . Tramadol Other (See Comments)    Hallucinations with higher doses  . Trazodone And Nefazodone Other (See Comments)  tachycardia    DISCHARGE MEDICATIONS:   Allergies as of 11/11/2017      Reactions   Hydrocodone-acetaminophen Other (See Comments)   Hallucinations at higher doses   Lyrica [pregabalin] Other (See Comments)   dizziness   Tramadol Other (See Comments)   Hallucinations with higher doses   Trazodone And Nefazodone Other  (See Comments)   tachycardia      Medication List    STOP taking these medications   gabapentin 300 MG capsule Commonly known as:  NEURONTIN     TAKE these medications   acetaminophen 325 MG tablet Commonly known as:  TYLENOL Take 2 tablets (650 mg total) by mouth every 6 (six) hours as needed for mild pain (or Fever >/= 101).   amLODipine 10 MG tablet Commonly known as:  NORVASC TAKE 1 TABLET (10 MG TOTAL) BY MOUTH DAILY. What changed:  See the new instructions.   aspirin 325 MG tablet Take 325 mg by mouth every evening.   atorvastatin 20 MG tablet Commonly known as:  LIPITOR TAKE 1 TABLET (20 MG TOTAL) BY MOUTH DAILY. What changed:  See the new instructions.   Calcium Carbonate-Vitamin D 600-200 MG-UNIT Tabs Take 1 tablet by mouth 2 (two) times daily.   cefdinir 300 MG capsule Commonly known as:  OMNICEF Take 1 capsule (300 mg total) by mouth every 12 (twelve) hours for 28 doses.   clopidogrel 75 MG tablet Commonly known as:  PLAVIX TAKE 1 TABLET (75 MG TOTAL) BY MOUTH DAILY.   collagenase ointment Commonly known as:  SANTYL Apply to wound twice a day What changed:    how much to take  how to take this  when to take this   cyanocobalamin 1000 MCG/ML injection Commonly known as:  (VITAMIN B-12) INJECT 1 ML (1,000 MCG TOTAL) INTO THE MUSCLE EVERY 30 (THIRTY) DAYS.   fenofibrate 48 MG tablet Commonly known as:  TRICOR TAKE 1 TABLET BY MOUTH DAILY What changed:  when to take this   furosemide 20 MG tablet Commonly known as:  LASIX Take 2 tablets (40 mg total) by mouth daily. AS NEEDED FOR FLUID RETENTION What changed:    how much to take  when to take this  additional instructions   glucose blood test strip CHECK BLOOD SUGAR 3 TIMES A DAY AS NEEDED DX E11.51   HYDROcodone-acetaminophen 10-325 MG tablet Commonly known as:  NORCO Take 1 tablet by mouth every 6 (six) hours as needed. What changed:    how much to take  how to take  this  when to take this  reasons to take this  additional instructions   hydrocortisone 2.5 % cream Apply 1 application topically 2 (two) times daily as needed.   insulin aspart 100 UNIT/ML injection Commonly known as:  novoLOG Inject 0-9 Units into the skin 3 (three) times daily with meals. 0-9 Units, Subcutaneous, 3 times daily with meals, First dose on Fri 11/07/17 at 1700 Correction coverage: Sensitive (thin, NPO, renal) CBG < 70: implement hypoglycemia protocol CBG 70 - 120: 0 units CBG 121 - 150: 1 unit CBG 151 - 200: 2 units CBG 201 - 250: 3 units CBG 251 - 300: 5 units CBG 301 - 350: 7 units CBG 351 - 400: 9 units CBG > 400: call MD and obtain STAT lab verification   insulin aspart 100 UNIT/ML injection Commonly known as:  novoLOG Inject 0-5 Units into the skin at bedtime.   insulin aspart protamine - aspart (70-30) 100 UNIT/ML FlexPen  Commonly known as:  NOVOLOG 70/30 MIX INJECT 18 units two times daily , adjust dose using sliding scale What changed:  additional instructions   Insulin Pen Needle 31G X 8 MM Misc USE AS DIRECTED 3 TIMES A DAY   Insulin Syringe-Needle U-100 31G X 5/16" 0.5 ML Misc 1 application by Does not apply route 3 (three) times daily before meals.   latanoprost 0.005 % ophthalmic solution Commonly known as:  XALATAN Place 1 drop into both eyes at bedtime.   losartan 100 MG tablet Commonly known as:  COZAAR TAKE 1 TABLET BY MOUTH EVERY DAY What changed:  when to take this   metoprolol tartrate 100 MG tablet Commonly known as:  LOPRESSOR TAKE 1 TABLET BY MOUTH TWICE A DAY What changed:  how much to take   multivitamin tablet Take 1 tablet by mouth every evening.   polyethylene glycol packet Commonly known as:  MIRALAX / GLYCOLAX Take 17 g by mouth daily as needed for mild constipation.   protein supplement shake Liqd Commonly known as:  PREMIER PROTEIN Take 325 mLs (11 oz total) by mouth 2 (two) times daily between meals.    Syringe (Disposable) 3 ML Misc For use with B12 injections weekly/monthly   SYRINGE-NEEDLE (DISP) 3 ML 25G X 1" 3 ML Misc FOR USE WITH B12 INJECTIONS WEEKLY/MONTHLY   traMADol 50 MG tablet Commonly known as:  ULTRAM Take 1 tablet (50 mg total) by mouth 2 (two) times daily. What changed:    when to take this  reasons to take this        DISCHARGE INSTRUCTIONS:   Follow-up with primary care physician at the facility in 3 to 4 days Follow-up with vascular surgery as scheduled on November 19, 2017 Resume home health and wound care  DIET:  Cardiac diet and Diabetic diet  DISCHARGE CONDITION:  Fair  ACTIVITY:  Activity as tolerated, ambulate with walker  OXYGEN:  Home Oxygen: No.   Oxygen Delivery: room air  DISCHARGE LOCATION:  [place]  If you experience worsening of your admission symptoms, develop shortness of breath, life threatening emergency, suicidal or homicidal thoughts you must seek medical attention immediately by calling 911 or calling your MD immediately  if symptoms less severe.  You Must read complete instructions/literature along with all the possible adverse reactions/side effects for all the Medicines you take and that have been prescribed to you. Take any new Medicines after you have completely understood and accpet all the possible adverse reactions/side effects.   Please note  You were cared for by a hospitalist during your hospital stay. If you have any questions about your discharge medications or the care you received while you were in the hospital after you are discharged, you can call the unit and asked to speak with the hospitalist on call if the hospitalist that took care of you is not available. Once you are discharged, your primary care physician will handle any further medical issues. Please note that NO REFILLS for any discharge medications will be authorized once you are discharged, as it is imperative that you return to your primary care  physician (or establish a relationship with a primary care physician if you do not have one) for your aftercare needs so that they can reassess your need for medications and monitor your lab values.     Today  Chief Complaint  Patient presents with  . Post-op Problem   Pt is feeling fine.  Had angiogram yesterday  ROS:  CONSTITUTIONAL: Denies  fevers, chills.  Reports malaise and fatigue  EYES: Denies blurry vision, double vision, eye pain. EARS, NOSE, THROAT: Denies tinnitus, ear pain, hearing loss. RESPIRATORY: Denies cough, wheeze, shortness of breath.  CARDIOVASCULAR: Denies chest pain, palpitations, edema.  GASTROINTESTINAL: Denies nausea, vomiting, diarrhea, abdominal pain. Denies bright red blood per rectum. GENITOURINARY: Denies dysuria, hematuria. ENDOCRINE: Denies nocturia or thyroid problems. HEMATOLOGIC AND LYMPHATIC: Denies easy bruising or bleeding. SKIN: Denies rash or lesion. MUSCULOSKELETAL: Patient is reporting chronic back pain and joint pain NEUROLOGIC: Denies paralysis, paresthesias.  PSYCHIATRIC: Denies anxiety or depressive symptoms.   VITAL SIGNS:  Blood pressure (!) 158/47, pulse 64, temperature 98.3 F (36.8 C), temperature source Oral, resp. rate 16, height 5\' 8"  (1.727 m), weight 71.2 kg, SpO2 99 %.  I/O:    Intake/Output Summary (Last 24 hours) at 11/11/2017 1115 Last data filed at 11/11/2017 1026 Gross per 24 hour  Intake 1015.31 ml  Output 1100 ml  Net -84.69 ml    PHYSICAL EXAMINATION:  GENERAL:  84 y.o.-year-old patient lying in the bed with no acute distress.  EYES: Pupils equal, round, reactive to light and accommodation. No scleral icterus. Extraocular muscles intact.  HEENT: Head atraumatic, normocephalic. Oropharynx and nasopharynx clear.  NECK:  Supple, no jugular venous distention. No thyroid enlargement, no tenderness.  LUNGS: Normal breath sounds bilaterally, no wheezing, rales,rhonchi or crepitation. No use of accessory muscles  of respiration.  CARDIOVASCULAR: S1, S2 normal. No murmurs, rubs, or gallops.  ABDOMEN: Soft, non-tender, non-distended. Bowel sounds present. No organomegaly or mass.  EXTREMITIES: Bilateral lower extremity edema with the left foot third toe amputation.  Erythema and cyanotic.  No purulent discharge  NEUROLOGIC: Cranial nerves II through XII are intact. Muscle strength global weakness in all extremities. Sensation intact. Gait not checked.  PSYCHIATRIC: The patient is alert and oriented x 3.  SKIN: No obvious rash, lesion, or ulcer.   DATA REVIEW:   CBC Recent Labs  Lab 11/10/17 0402  WBC 7.2  HGB 10.0*  HCT 29.6*  PLT 291    Chemistries  Recent Labs  Lab 11/07/17 1206  11/10/17 0402  NA 139   < > 139  K 5.3*   < > 4.0  CL 104   < > 102  CO2 28   < > 24  GLUCOSE 132*   < > 124*  BUN 48*   < > 32*  CREATININE 1.76*   < > 1.94*  CALCIUM 9.3   < > 8.7*  MG  --    < > 1.7  AST 16  --   --   ALT 13  --   --   ALKPHOS 59  --   --   BILITOT 0.4  --   --    < > = values in this interval not displayed.    Cardiac Enzymes No results for input(s): TROPONINI in the last 168 hours.  Microbiology Results  Results for orders placed or performed during the hospital encounter of 11/07/17  CULTURE, BLOOD (ROUTINE X 2) w Reflex to ID Panel     Status: None (Preliminary result)   Collection Time: 11/07/17  3:48 PM  Result Value Ref Range Status   Specimen Description BLOOD L AC  Final   Special Requests   Final    BOTTLES DRAWN AEROBIC AND ANAEROBIC Blood Culture adequate volume   Culture   Final    NO GROWTH 4 DAYS Performed at [facility], [address] [place], [place] [address]  Report Status PENDING  Incomplete  CULTURE, BLOOD (ROUTINE X 2) w Reflex to ID Panel     Status: None (Preliminary result)   Collection Time: 11/07/17  3:48 PM  Result Value Ref Range Status   Specimen Description BLOOD R HAND  Final   Special Requests   Final    BOTTLES DRAWN  AEROBIC AND ANAEROBIC Blood Culture results may not be optimal due to an excessive volume of blood received in culture bottles   Culture   Final    NO GROWTH 4 DAYS Performed at [facility], [address], [place], [place] [address]    Report Status PENDING  Incomplete    RADIOLOGY:  Dg Foot Complete Left  Result Date: 11/07/2017 CLINICAL DATA:  Redness and swelling after toe amputation 2 weeks ago. EXAM: LEFT FOOT - COMPLETE 3+ VIEW COMPARISON:  CT LEFT foot October 22, 2017 FINDINGS: New erosive changes third metatarsal head, status post resection of third phalanges. No acute fracture deformity. No dislocation. Mild first MTP osteoarthrosis. Slight widening of the second MTP joint space. Type 2 navicular bone. Severe vascular calcifications. Soft tissue swelling without subcutaneous gas or radiopaque foreign bodies. IMPRESSION: 1. S/p third toe amputation. New postoperative defect third metatarsal head versus osteomyelitis. 2. Second MTP joint space widening seen with effusion or projectional artifact. 3. Soft tissue swelling, no subcutaneous gas. Electronically Signed   By: Elon Alas M.D.   On: 11/07/2017 13:51    EKG:   Orders placed or performed during the hospital encounter of 10/29/17  . EKG test  . EKG test      Management plans discussed with the patient, family and they are in agreement.  CODE STATUS:     Code Status Orders  (From admission, onward)         Start     Ordered   11/07/17 1624  Do not attempt resuscitation (DNR)  Continuous    Question Answer Comment  In the event of cardiac or respiratory ARREST Do not call a "code blue"   In the event of cardiac or respiratory ARREST Do not perform Intubation, CPR, defibrillation or ACLS   In the event of cardiac or respiratory ARREST Use medication by any route, position, wound care, and other measures to relive pain and suffering. May use oxygen, suction and manual treatment of airway obstruction  as needed for comfort.      11/07/17 1625        Code Status History    Date Active Date Inactive Code Status Order ID Comments User Context   10/27/2017 0947 10/27/2017 1500 Full Code [id number]  Algernon Huxley, MD Inpatient   10/20/2017 1028 10/20/2017 1520 Full Code [id number]  Algernon Huxley, MD Inpatient   10/14/2017 [phone] 10/20/2017 0853 DNR [id number] Discussed during outpatient OV sept 9 2019 . Forms signed and given to patient Crecencio Mc, MD Outpatient   08/18/2017 1414 08/18/2017 1912 Full Code [id number]  Algernon Huxley, MD Inpatient    Advance Directive Documentation     Most Recent Value  Type of Advance Directive  Healthcare Power of Attorney  Pre-existing out of facility DNR order (yellow form or pink MOST form)  -  "MOST" Form in Place?  -      TOTAL TIME TAKING CARE OF THIS PATIENT: 43  minutes.   Note: This dictation was prepared with Dragon dictation along with smaller phrase technology. Any transcriptional errors that result from this process are unintentional.   @  MEC@  on 11/11/2017 at 11:15 AM  Between 7am to 6pm - Pager - [phone]  After 6pm go to [email or web address] - password [email or web address]  Office  [phone]  CC: Primary care physician; Crecencio Mc, MD

## 2017-11-11 NOTE — Progress Notes (Signed)
The patient has been discharged. Discharged instructions provided to the patient and to the daughter and law.

## 2017-11-11 NOTE — Progress Notes (Signed)
Please note patient is currently followed by outpatient PALLIATIVE at home. CMRN Isaias Cowman made aware. Flo Shanks RN, BSN, [facility] [place], hospital liaison [phone]

## 2017-11-12 ENCOUNTER — Other Ambulatory Visit: Payer: Self-pay | Admitting: Internal Medicine

## 2017-11-12 DIAGNOSIS — I70203 Unspecified atherosclerosis of native arteries of extremities, bilateral legs: Secondary | ICD-10-CM | POA: Diagnosis not present

## 2017-11-12 DIAGNOSIS — L97513 Non-pressure chronic ulcer of other part of right foot with necrosis of muscle: Secondary | ICD-10-CM | POA: Diagnosis not present

## 2017-11-12 DIAGNOSIS — L03116 Cellulitis of left lower limb: Secondary | ICD-10-CM

## 2017-11-12 DIAGNOSIS — M79604 Pain in right leg: Secondary | ICD-10-CM | POA: Diagnosis not present

## 2017-11-12 DIAGNOSIS — Z794 Long term (current) use of insulin: Secondary | ICD-10-CM | POA: Diagnosis not present

## 2017-11-12 DIAGNOSIS — I7025 Atherosclerosis of native arteries of other extremities with ulceration: Secondary | ICD-10-CM

## 2017-11-12 DIAGNOSIS — I10 Essential (primary) hypertension: Secondary | ICD-10-CM | POA: Diagnosis not present

## 2017-11-12 DIAGNOSIS — E1141 Type 2 diabetes mellitus with diabetic mononeuropathy: Secondary | ICD-10-CM

## 2017-11-12 DIAGNOSIS — E11621 Type 2 diabetes mellitus with foot ulcer: Secondary | ICD-10-CM | POA: Diagnosis not present

## 2017-11-12 LAB — CULTURE, BLOOD (ROUTINE X 2)
CULTURE: NO GROWTH
CULTURE: NO GROWTH
SPECIAL REQUESTS: ADEQUATE

## 2017-11-12 NOTE — Progress Notes (Signed)
ISIAHA, GREENUP ([id number]) Visit Report for 11/07/2017 Arrival Information Details Patient Name: [name], [name]. Date of Service: 11/07/2017 10:15 AM Medical Record Number: [id number] Patient Account Number: [id number] Date of Birth/Sex: 06-Jul-1924 (84 y.o. M) Treating RN: Montey Hora Primary Care Sopheap Boehle: Deborra Medina Other Clinician: Referring Jamien Casanova: Deborra Medina Treating Davin Muramoto/Extender: Melburn Hake, HOYT Weeks in Treatment: 4 Visit Information History Since Last Visit Added or deleted any medications: No Patient Arrived: Wheel Chair Any new allergies or adverse reactions: No Arrival Time: 10:11 Had a fall or experienced change in No Accompanied By: son activities of daily living that may affect Transfer Assistance: None risk of falls: Patient Identification Verified: Yes Signs or symptoms of abuse/neglect since last visito No Secondary Verification Process Completed: Yes Hospitalized since last visit: No Implantable device outside of the clinic excluding No cellular tissue based products placed in the center since last visit: Has Dressing in Place as Prescribed: Yes Pain Present Now: No Electronic Signature(s) Signed: 11/07/2017 4:03:51 PM By: Lorine Bears RCP, RRT, CHT Entered By: Becky Sax, Amado Nash on 11/07/2017 10:16:29 Mullinax, [name] ([id number]) -------------------------------------------------------------------------------- Encounter Discharge Information Details Patient Name: [name], [name] Date of Service: 11/07/2017 10:15 AM Medical Record Number: [id number] Patient Account Number: [id number] Date of Birth/Sex: 11-06-1924 (84 y.o. M) Treating RN: Montey Hora Primary Care Manuelita Moxon: Deborra Medina Other Clinician: Referring Annalysse Shoemaker: Deborra Medina Treating Lorayne Getchell/Extender: Melburn Hake, HOYT Weeks in Treatment: 4 Encounter Discharge Information Items Discharge Condition: Stable Ambulatory Status:  Wheelchair Discharge Destination: Home Transportation: Private Auto Accompanied By: son Schedule Follow-up Appointment: Yes Clinical Summary of Care: Electronic Signature(s) Signed: 11/07/2017 5:05:51 PM By: Montey Hora Entered By: Montey Hora on 11/07/2017 11:07:26 Matus, [name] ([id number]) -------------------------------------------------------------------------------- Lower Extremity Assessment Details Patient Name: [name], [name] Date of Service: 11/07/2017 10:15 AM Medical Record Number: [id number] Patient Account Number: [id number] Date of Birth/Sex: 02-28-1924 (84 y.o. M) Treating RN: Cornell Barman Primary Care Pearla Mckinny: Deborra Medina Other Clinician: Referring Raizy Auzenne: Deborra Medina Treating Zlata Alcaide/Extender: Melburn Hake, HOYT Weeks in Treatment: 4 Edema Assessment Assessed: [Left: No] [Right: No] [Left: Edema] [Right: :] Calf Left: Right: Point of Measurement: 30 cm From Medial Instep 35 cm 37.8 cm Ankle Left: Right: Point of Measurement: 10 cm From Medial Instep 28 cm 37.2 cm Vascular Assessment Claudication: Claudication Assessment [Left:Rest Pain] [Right:Rest Pain] Pulses: Dorsalis Pedis Palpable: [Left:Yes] [Right:Yes] Posterior Tibial Extremity colors, hair growth, and conditions: Extremity Color: [Left:Pale] [Right:Pale] Hair Growth on Extremity: [Left:No] [Right:No] Temperature of Extremity: [Left:Cool] [Right:Cool] Capillary Refill: [Left:> 3 seconds] [Right:< 3 seconds] Electronic Signature(s) Signed: 11/07/2017 10:35:23 AM By: Gretta Cool, BSN, RN, CWS, Kim RN, BSN Previous Signature: 11/07/2017 10:35:11 AM Version By: Gretta Cool, BSN, RN, CWS, Kim RN, BSN Entered By: Gretta Cool, BSN, RN, CWS, Kim on 11/07/2017 10:35:23 Lingenfelter, [name] ([id number]) -------------------------------------------------------------------------------- Multi Wound Chart Details Patient Name: [name], [name] Date of Service: 11/07/2017 10:15 AM Medical Record Number:  [id number] Patient Account Number: [id number] Date of Birth/Sex: 1924/03/26 (84 y.o. M) Treating RN: Montey Hora Primary Care Estuardo Frisbee: Deborra Medina Other Clinician: Referring Teryl Gubler: Deborra Medina Treating Rehema Muffley/Extender: Melburn Hake, HOYT Weeks in Treatment: 4 Vital Signs Height(in): 68 Pulse(bpm): 42 Weight(lbs): 180 Blood Pressure(mmHg): 129/41 Body Mass Index(BMI): 27 Temperature(F): 98.2 Respiratory Rate 18 (breaths/min): Photos: [1:No Photos] [N/A:N/A] Wound Location: [1:Right Toe Second] [N/A:N/A] Wounding Event: [1:Gradually Appeared] [N/A:N/A] Primary Etiology: [1:Diabetic Wound/Ulcer of the Lower Extremity] [N/A:N/A] Secondary Etiology: [1:Arterial Insufficiency Ulcer] [N/A:N/A] Comorbid History: [1:Cataracts, Lymphedema, Coronary Artery Disease, Hypertension, Peripheral Venous Disease, Type II Diabetes, End Stage Renal  Disease, Rheumatoid Arthritis, Neuropathy] [N/A:N/A] Date Acquired: [1:06/04/2017] [N/A:N/A] Weeks of Treatment: [1:4] [N/A:N/A] Wound Status: [1:Open] [N/A:N/A] Pending Amputation on [1:Yes] [N/A:N/A] Presentation: Measurements L x W x D [1:0.8x0.9x0.1] [N/A:N/A] (cm) Area (cm) : [1:0.565] [N/A:N/A] Volume (cm) : [1:0.057] [N/A:N/A] % Reduction in Area: [1:52.00%] [N/A:N/A] % Reduction in Volume: [1:75.80%] [N/A:N/A] Classification: [1:Grade 2] [N/A:N/A] Exudate Amount: [1:Small] [N/A:N/A] Exudate Type: [1:Serous] [N/A:N/A] Exudate Color: [1:amber] [N/A:N/A] Wound Margin: [1:Distinct, outline attached] [N/A:N/A] Granulation Amount: [1:Medium (34-66%)] [N/A:N/A] Granulation Quality: [1:Red, Pink] [N/A:N/A] Necrotic Amount: [1:Medium (34-66%)] [N/A:N/A] Necrotic Tissue: [1:Eschar, Adherent Slough] [N/A:N/A] Exposed Structures: [1:Fat Layer (Subcutaneous Tissue) Exposed: Yes Fascia: No Tendon: No] [N/A:N/A] Muscle: No Joint: No Bone: No Epithelialization: None N/A N/A Periwound Skin Texture: Excoriation: No N/A N/A Induration:  No Callus: No Crepitus: No Rash: No Scarring: No Periwound Skin Moisture: Maceration: Yes N/A N/A Dry/Scaly: No Periwound Skin Color: Atrophie Blanche: No N/A N/A Cyanosis: No Ecchymosis: No Erythema: No Hemosiderin Staining: No Mottled: No Pallor: No Rubor: No Temperature: No Abnormality N/A N/A Tenderness on Palpation: No N/A N/A Wound Preparation: Ulcer Cleansing: N/A N/A Rinsed/Irrigated with Saline Topical Anesthetic Applied: Other: lidocaine 4% Treatment Notes Electronic Signature(s) Signed: 11/07/2017 5:05:51 PM By: Montey Hora Entered By: Montey Hora on 11/07/2017 10:54:38 Chojnowski, [name] ([id number]) -------------------------------------------------------------------------------- [place] Details Patient Name: [name], [name] Date of Service: 11/07/2017 10:15 AM Medical Record Number: [id number] Patient Account Number: [id number] Date of Birth/Sex: [birth year] (84 y.o. M) Treating RN: Montey Hora Primary Care Kuzey Ogata: Deborra Medina Other Clinician: Referring Hannah Crill: Deborra Medina Treating Remiel Corti/Extender: Melburn Hake, HOYT Weeks in Treatment: 4 Active Inactive ` Abuse / Safety / Falls / Self Care Management Nursing Diagnoses: Potential for falls Goals: Patient will remain injury free related to falls Date Initiated: 10/10/2017 Target Resolution Date: 01/02/2018 Goal Status: Active Interventions: Assess fall risk on admission and as needed Notes: ` Orientation to the Wound Care Program Nursing Diagnoses: Knowledge deficit related to the wound healing center program Goals: Patient/caregiver will verbalize understanding of the [place] Program Date Initiated: 10/10/2017 Target Resolution Date: 01/02/2018 Goal Status: Active Interventions: Provide education on orientation to the wound center Notes: ` Pain, Acute or Chronic Nursing Diagnoses: Potential alteration in comfort, pain Goals: Patient/caregiver  will verbalize adequate pain control between visits Date Initiated: 10/10/2017 Target Resolution Date: 01/02/2018 Goal Status: Active Interventions: Biegler, ABDALLA NARAMORE ([id number]) Complete pain assessment as per visit requirements Notes: ` Wound/Skin Impairment Nursing Diagnoses: Impaired tissue integrity Goals: Ulcer/skin breakdown will heal within 14 weeks Date Initiated: 10/10/2017 Target Resolution Date: 01/02/2018 Goal Status: Active Interventions: Assess patient/caregiver ability to obtain necessary supplies Assess patient/caregiver ability to perform ulcer/skin care regimen upon admission and as needed Assess ulceration(s) every visit Notes: Electronic Signature(s) Signed: 11/07/2017 5:05:51 PM By: Montey Hora Entered By: Montey Hora on 11/07/2017 10:54:32 [name], [name] ([id number]) -------------------------------------------------------------------------------- Non-Wound Condition Assessment Details Patient Name: [name], [name] Date of Service: 11/07/2017 10:15 AM Medical Record Number: [id number] Patient Account Number: [id number] Date of Birth/Sex: [birth year] (84 y.o. M) Treating RN: Cornell Barman Primary Care Ryoma Nofziger: Deborra Medina Other Clinician: Referring Ugonna Keirsey: Deborra Medina Treating Zniyah Midkiff/Extender: Melburn Hake, HOYT Weeks in Treatment: 4 Non-Wound Condition: Condition: Suspected Deep Tissue Injury Location: Foot Side: Left Photos Periwound Skin Texture Texture Color No Abnormalities Noted: No No Abnormalities Noted: No Moisture No Abnormalities Noted: No Electronic Signature(s) Signed: 11/07/2017 4:01:15 PM By: Gretta Cool, BSN, RN, CWS, Kim RN, BSN Previous Signature: 11/07/2017 4:00:50 PM Version By: Gretta Cool, BSN, RN, CWS, Kim RN, BSN Entered  By: Gretta Cool, BSN, RN, CWS, Kim on 11/07/2017 16:01:15 [name], [name] ([id number]) -------------------------------------------------------------------------------- Pain Assessment Details Patient Name:  [name], [name] WEIDA. Date of Service: 11/07/2017 10:15 AM Medical Record Number: [id number] Patient Account Number: [id number] Date of Birth/Sex: 12/04/1924 (84 y.o. M) Treating RN: Montey Hora Primary Care Ivy Meriwether: Deborra Medina Other Clinician: Referring Anuj Summons: Deborra Medina Treating Osmar Howton/Extender: Melburn Hake, HOYT Weeks in Treatment: 4 Active Problems Location of Pain Severity and Description of Pain Patient Has Paino No Site Locations Pain Management and Medication Current Pain Management: Electronic Signature(s) Signed: 11/07/2017 4:03:51 PM By: Lorine Bears RCP, RRT, CHT Signed: 11/07/2017 5:05:51 PM By: Montey Hora Entered By: Lorine Bears on 11/07/2017 10:16:42 Cavan, [name] ([id number]) -------------------------------------------------------------------------------- Patient/Caregiver Education Details Patient Name: [name], [name] Date of Service: 11/07/2017 10:15 AM Medical Record Number: [id number] Patient Account Number: [id number] Date of Birth/Gender: 1924/03/15 (84 y.o. M) Treating RN: Montey Hora Primary Care Physician: Deborra Medina Other Clinician: Referring Physician: Deborra Medina Treating Physician/Extender: Sharalyn Ink in Treatment: 4 Education Assessment Education Provided To: Patient and Caregiver Education Topics Provided Wound/Skin Impairment: Handouts: Other: wound care as ordered Methods: Demonstration, Explain/Verbal Responses: State content correctly Electronic Signature(s) Signed: 11/07/2017 5:05:51 PM By: Montey Hora Entered By: Montey Hora on 11/07/2017 11:05:59 [name], [name] ([id number]) -------------------------------------------------------------------------------- Wound Assessment Details Patient Name: [name], [name] Date of Service: 11/07/2017 10:15 AM Medical Record Number: [id number] Patient Account Number: [id number] Date of Birth/Sex: 01-09-1925 (84 y.o.  M) Treating RN: Cornell Barman Primary Care Camary Sosa: Deborra Medina Other Clinician: Referring Elayah Klooster: Deborra Medina Treating Makeila Yamaguchi/Extender: Melburn Hake, HOYT Weeks in Treatment: 4 Wound Status Wound Number: 1 Primary Diabetic Wound/Ulcer of the Lower Extremity Etiology: Wound Location: Right Toe Second Secondary Arterial Insufficiency Ulcer Wounding Event: Gradually Appeared Etiology: Date Acquired: 06/04/2017 Wound Open Weeks Of Treatment: 4 Status: Clustered Wound: No Comorbid Cataracts, Lymphedema, Coronary Artery Pending Amputation On Presentation History: Disease, Hypertension, Peripheral Venous Disease, Type II Diabetes, End Stage Renal Disease, Rheumatoid Arthritis, Neuropathy Photos Photo Uploaded By: Gretta Cool, BSN, RN, CWS, Kim on 11/07/2017 15:59:30 Wound Measurements Length: (cm) 0.8 Width: (cm) 0.9 Depth: (cm) 0.1 Area: (cm) 0.565 Volume: (cm) 0.057 % Reduction in Area: 52% % Reduction in Volume: 75.8% Epithelialization: None Tunneling: No Undermining: No Wound Description Classification: Grade 2 Foul Odor Wound Margin: Distinct, outline attached Slough/Fi Exudate Amount: Small Exudate Type: Serous Exudate Color: amber After Cleansing: No brino Yes Wound Bed Granulation Amount: Medium (34-66%) Exposed Structure Granulation Quality: Red, Pink Fascia Exposed: No Necrotic Amount: Medium (34-66%) Fat Layer (Subcutaneous Tissue) Exposed: Yes Necrotic Quality: Eschar, Adherent Slough Tendon Exposed: No Muscle Exposed: No Joint Exposed: No Tiede, Pascual S. ([id number]) Bone Exposed: No Periwound Skin Texture Texture Color No Abnormalities Noted: No No Abnormalities Noted: No Callus: No Atrophie Blanche: No Crepitus: No Cyanosis: No Excoriation: No Ecchymosis: No Induration: No Erythema: No Rash: No Hemosiderin Staining: No Scarring: No Mottled: No Pallor: No Moisture Rubor: No No Abnormalities Noted: No Dry / Scaly: No Temperature /  Pain Maceration: Yes Temperature: No Abnormality Wound Preparation Ulcer Cleansing: Rinsed/Irrigated with Saline Topical Anesthetic Applied: Other: lidocaine 4%, Treatment Notes Wound #1 (Right Toe Second) 1. Cleansed with: Clean wound with Normal Saline 2. Anesthetic Topical Lidocaine 4% cream to wound bed prior to debridement 4. Dressing Applied: Santyl Ointment Other dressing (specify in notes) Notes coverlet Electronic Signature(s) Signed: 11/07/2017 10:33:33 AM By: Gretta Cool, BSN, RN, CWS, Kim RN, BSN Entered By: Gretta Cool, BSN, RN, CWS, Kim on 11/07/2017 10:33:33 Vanhecke, Herbie [place]  Chauncey Cruel ([id number]) -------------------------------------------------------------------------------- Vitals Details Patient Name: Asfaw, COLETON WOON. Date of Service: 11/07/2017 10:15 AM Medical Record Number: [id number] Patient Account Number: [id number] Date of Birth/Sex: 06-10-1924 (84 y.o. M) Treating RN: Montey Hora Primary Care Breane Grunwald: Deborra Medina Other Clinician: Referring Umberto Pavek: Deborra Medina Treating Leotha Westermeyer/Extender: Melburn Hake, HOYT Weeks in Treatment: 4 Vital Signs Time Taken: 10:15 Temperature (F): 98.2 Height (in): 68 Pulse (bpm): 69 Weight (lbs): 180 Respiratory Rate (breaths/min): 18 Body Mass Index (BMI): 27.4 Blood Pressure (mmHg): 129/41 Reference Range: 80 - 120 mg / dl Electronic Signature(s) Signed: 11/07/2017 4:03:51 PM By: Lorine Bears RCP, RRT, CHT Entered By: Lorine Bears on 11/07/2017 10:19:45

## 2017-11-12 NOTE — Progress Notes (Signed)
amb ref to vasc

## 2017-11-12 NOTE — Progress Notes (Signed)
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 pinion. He really has not had any significant improvement overall visually that I can see. With that being said the patient nonetheless does not seem to be doing any worse as far as his overall feeling and experience is concerned. During the time that he was here for his consult I did not have the results of his wound culture at that point. [name], [name] ([id number]) 10/24/17 on evaluation today patient's ulcers in regard to his foot actually appear to be doing about the same. In general the CT scans which were performed pretty much confirm the question that I had based on the x-rays that we had obtained. Again the x-ray showed potential osteomyelitis of the toe on the right foot but not the left. With that being said when we actually obtained the CT scans it appears that most likely the wound on the right toe is actually not associated with osteomyelitis. In regard to the left foot it did appear that the patient had a possible focus of osteomyelitis involving the lateral aspect of the distal phalanx  of the great toe. Other than this there were no obvious findings of osteomyelitis although MRI was suggested for further evaluation if possible. Nonetheless with the patient's pacemaker I'm not sure that is possible. That is why we obviously went toward doing the CT scan in the first place. The patient also did have an angiogram performed by Dr. dew on 10/20/17. Initially it appeared that though the patient had heavily calcified vessels the majority of his vascular appear to be fairly good in regard to flow with no greater than 20 to 30% stenosis. There was severe tibial disease the posterior tibial artery not seen in all. This was occlusion shortly be on the origin. According to the note there was no good target for revascularization. The peroneal artery was the only run off distally and in the mid segment there was a short conclusion that was highly calcific. The patient had balloon angioplasty in the mid- peroneal artery. Completion imaging showed brisk flow in the peroneal artery with less than 20% residual stenosis although he still has significant small vessel disease and the foot and ankle that is stated to not be amendable to any further therapy. The procedure was therefore electrically terminated. At this point unfortunately the patient's toe on  the left foot, third toe, actually appears to be doing significantly worse. It's cool to touch, cyanotic, and does seem to be progressing in a very poor direction. I think this is going to require amputation. I actually ended up having a conversation with the patient as well as his daughter who was present during evaluation today. Also subsequently ended up talking to Dr. dew concerning the toe and what we're seeing. Dr. dew feels that the patient could still continue with the angiogram of the right lower extremity this coming Monday and they are going to see about potentially getting him set up for amputation of the third toe possibly even this  coming Wednesday. He's gonna check with the scheduler. Nonetheless that we detailed in greater detail in the plan. 10/31/17 on evaluation today patient presents following having had his right angiogram which was performed on Monday 10/27/17. 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 The anterior tibial artery had a short segment occlusion approximately then occluded distally just above the ankle without contributing much fluid into the foot. He had significant microvascular disease not amenable to endovascular therapy. The anterior tibial artery was treated with balloon angioplasty with improved flow proximally and less than 30% residual stenosis but continued occlusion distally. The peroneal artery did not require any intervention. It was felt by Dr. dew that there was nothing further that could be done from a endovascular standpoint for the patient has the majority of the disease with small vessel microvascular disease in the fluid. The patient also had an amputation of the left third toe and metatarsal head which was performed on 10/29/17 also by Dr. dew. Apparently the patient progress well for the surgery without complication and was discharged home in stable condition. There does not appear to be any evidence of infection at this time. Regard to his ulcer on the right third toe things seem to be doing fairly well today I think he is tolerating the center well there was a little bit of callous buildup over the distal portion of the toe. 11/07/17 on evaluation today patient actually appears to be doing about the same in regard to his right third toe ulcer. I really am not seeing much in the way of improvement at this time unfortunately. With that being said in regard to his left foot there are definitely some issues that I see present at this point. First and foremost obviously he has had the third toe amputation as performed by Dr. dew.  Unfortunately the remaining toes of this point of becoming cyanotic with a delayed Letter refill of eight seconds. There also cool to touch was has me concerned as well. Patient was seen today in the presence of his son during evaluation. 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 This tends to happen when the patient lies flat especially with his legs elevated. Nonetheless in general I am concerned that his left lower extremity specifically in the foot region and toes is not doing as well as what I saw two weeks ago when he was warm to touch with good capillary refill. Patient History Information obtained from Patient. Social History Former smoker, Marital Status - Widowed, Alcohol Use - Rarely, Drug Use - No History, Caffeine Use - Never. [name],  [name] [place]AUGH ([id number]) Medical And Surgical History Notes Cardiovascular pacemaker, 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 leg, stent left leg Review of Systems (ROS) Constitutional Symptoms (General Health) Denies complaints or symptoms of Fever, Chills. Respiratory The patient has no complaints or symptoms. Cardiovascular Complains or has symptoms of LE edema. Psychiatric The patient has no complaints or symptoms. Objective Constitutional Well-nourished and well-hydrated in no acute distress. Vitals Time Taken: 10:15 AM, Height: 68 in, Weight: 180 lbs, BMI: 27.4, Temperature: 98.2 F, Pulse: 69 bpm, Respiratory Rate: 18 breaths/min, Blood Pressure: 129/41 mmHg. Respiratory normal breathing without difficulty. clear to auscultation bilaterally. Cardiovascular regular rate and rhythm with normal S1, S2. Psychiatric this patient is able to make decisions and demonstrates good insight into disease process. Alert and Oriented x 3.  pleasant and cooperative. General Notes: On inspection today patient's left lower extremity has me quite a bit concerned. He has evidence of cyanosis and decreased capillary refill of around eight seconds in the great toe. The coloring is also erythematous in several areas with cyanosis been noted and cool digits which two weeks ago when I evaluated him the digits were not cool they will warm to touch only the third digit which has now been amputated was discolored or cool to touch at that point. He does have bilateral lower extremity edema he wears compression stockings at this point. Integumentary (Hair, Skin) Wound #1 status is Open. Original cause of wound was Gradually Appeared. The wound is located on the Right Toe Second. The wound measures 0.8cm length x 0.9cm width x 0.1cm depth; 0.565cm^2 area and 0.057cm^3 volume. There is Fat Layer (Subcutaneous Tissue) Exposed exposed. There is no tunneling or undermining noted. There is a small amount of serous drainage noted. The wound margin is distinct with the outline attached to the wound base. There is medium (34-66%) red, pink granulation within the wound bed. There is a medium (34-66%) amount of necrotic tissue within the wound bed including Eschar and Adherent Slough. The periwound skin appearance exhibited: Maceration. The periwound skin appearance did not exhibit: Callus, Crepitus, Excoriation, Induration, Rash, Scarring, Dry/Scaly, Atrophie Blanche, Cyanosis, Ecchymosis, Hemosiderin Staining, Mottled, Pallor, Rubor, Erythema. Periwound temperature was noted as No Abnormality. 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 There was a Chemical/Enzymatic/Mechanical debridement performed by STONE III, HOYT E., PA-C. after achieving pain control using Lidocaine 4% Topical Solution. Agent used was [employer]. A time out was conducted at 11:07, prior to the start of the procedure. There was no bleeding. The procedure was tolerated well with a pain level of 0 throughout and a pain level of 0 following the procedure. Post Debridement Measurements: 0.8cm length x 0.9cm width x 0.1cm depth; 0.057cm^3 volume. Character of Wound/Ulcer Post Debridement is stable. Severity of Tissue Post Debridement is: Fat layer exposed. Post procedure Diagnosis Wound #1: Same as Pre-Procedure Plan Wound Cleansing: Wound #1 Right Toe Second: Clean wound with Normal Saline. May Shower, gently pat wound dry prior to applying new  dressing. Anesthetic (add to Medication List): Wound #1 Right Toe Second: Topical Lidocaine 4% cream applied to wound bed prior to debridement (In Clinic Only). Primary Wound Dressing: Wound #1 Right Toe Second: Santyl Ointment Secondary Dressing: Wound #1 Right Toe Second: Gauze and Kerlix/Conform Dressing Change Frequency: Wound #1 Right Toe Second: Change dressing every day. - HHRN to change dressing three times weekly and [facility] will change dressing once and family will attempt to change dressing the other days Follow-up Appointments: EAN, GETTEL ([id number]) Wound #1 Right Toe Second: Return Appointment in 2 weeks. Home Health: Wound #1 Right Toe Second: [place] Nurse may visit PRN to address  patient s wound care needs. FACE TO FACE ENCOUNTER: MEDICARE and MEDICAID PATIENTS: I certify that this patient is under my care and that I had a face-to-face encounter that meets the physician face-to-face encounter requirements with this patient on this date. The encounter with the patient was in whole or in part for the following MEDICAL CONDITION: (primary reason for [place]) MEDICAL NECESSITY: I certify, that based on my findings, NURSING services are a medically necessary home health service. HOME BOUND STATUS: I certify that my clinical findings support that this patient is homebound (i.e., Due to illness or injury, pt requires aid of supportive devices such as crutches, cane, wheelchairs, walkers, the use of special transportation or the assistance of another person to leave their place of residence. There is a normal inability to leave the home and doing so requires considerable and taxing effort. Other absences are for medical reasons / religious services and are infrequent or of short duration when for other reasons). If current dressing causes regression in wound condition, may D/C ordered dressing product/s and apply Normal Saline Moist Dressing daily until next [place] / Other MD appointment. [place] of regression in wound condition at [phone]. Please direct any NON-WOUND related issues/requests for orders to patient's Primary Care Physician Medications-please add to medication list.: Wound #1 Right Toe Second: Santyl Enzymatic Ointment On evaluation at this point my suggestion was that we continue with the Current wound care measures for the right third toe. With that being said in regard to the left foot of the issues that were seen at this point actually did have a telephone conversation with Dr. dew again today. During the conversation I explained what I was seeing physically on examination. His opinion was that I should have the  patient go to the emergency department for further evaluation and treatment which I did discuss with the patient and his son today as well. I do believe he may be, showing signs of critical limb ischemia of the left lower extremity and I am concerned about the fact that this has changed since I last saw him. And definitely appears to be much worse. After discussion with the patient and his son he is going to go to the ER for further evaluation. His right toe ulcer was dressed today with the Santyl dressing as before. Please see above for specific wound care orders. We will see patient for re-evaluation in 2 week(s) here in the clinic. If anything worsens or changes patient will contact our office for additional recommendations. Electronic Signature(s) Signed: 11/10/2017 1:32:29 PM By: Worthy Keeler PA-C Entered By: Worthy Keeler on 11/07/2017 13:24:11 Ackman, [name] ([id number]) -------------------------------------------------------------------------------- ROS/PFSH Details Patient Name: Aller, Jaiceon S. Date of Service: 11/07/2017 10:15 AM Medical Record Number: [id number]  Patient Account Number: [id number] Date of Birth/Sex: [birth year] (84 y.o. M) Treating RN: Montey Hora Primary Care Provider: Deborra Medina Other Clinician: Referring Provider: Deborra Medina Treating Provider/Extender: Melburn Hake, HOYT Weeks in Treatment: 4 Information Obtained From Patient Wound History Constitutional Symptoms (General Health) Complaints and Symptoms: Negative for: Fever; Chills Cardiovascular Complaints and Symptoms: Positive for: LE edema Medical History: Positive for: Coronary Artery Disease; Hypertension; Peripheral Venous Disease Negative for: Angina; Arrhythmia; Congestive Heart Failure; Deep Vein Thrombosis; Hypotension; Myocardial Infarction; Peripheral Arterial Disease; Phlebitis; Vasculitis Past Medical History Notes: pacemaker, 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 leg, stent left  leg Eyes Medical History: Positive for: Cataracts Negative for: Glaucoma; Optic Neuritis Ear/Nose/Mouth/Throat Medical History: Negative for: Chronic sinus problems/congestion; Middle ear problems Hematologic/Lymphatic Medical History: Positive for: Lymphedema Negative for: Anemia; Hemophilia; Human Immunodeficiency Virus; Sickle Cell Disease Respiratory Complaints and Symptoms: No Complaints or Symptoms Medical History: Negative for: Aspiration; Asthma; Chronic Obstructive Pulmonary Disease (COPD); Pneumothorax; Sleep Apnea; Tuberculosis Gastrointestinal Germer, Harcourt ([id number]) Medical History: Negative for: Cirrhosis ; Colitis; Crohnos; Hepatitis A; Hepatitis B; Hepatitis C Endocrine Medical History: Positive for: Type II Diabetes - since 1988 Negative for: Type I Diabetes Treated with: Insulin Blood sugar tested every day: Yes Tested : 3 times daily Blood sugar testing results: Breakfast: 114 Genitourinary Medical History: Positive for: End Stage Renal Disease Immunological Medical History: Negative for: Lupus Erythematosus; Raynaudos; Scleroderma Integumentary (Skin) Medical History: Negative for: History of Burn; History of pressure wounds Musculoskeletal Medical History: Positive for: Rheumatoid Arthritis Negative for: Gout; Osteoarthritis; Osteomyelitis Neurologic Medical History: Positive for: Neuropathy - feet and legs Negative for: Dementia; Quadriplegia; Paraplegia; Seizure Disorder Psychiatric Complaints and Symptoms: No Complaints or Symptoms Medical History: Negative for: Anorexia/bulimia; Confinement Anxiety HBO Extended History Items Eyes: Cataracts Immunizations Pneumococcal Vaccine: Received Pneumococcal Vaccination: No Implantable Devices Spurgeon, DORRANCE SELLICK ([id number]) Family and Social History Former smoker; Marital Status - Widowed; Alcohol Use: Rarely; Drug Use: No History; Caffeine Use: Never; Financial Concerns: No; Food,  Clothing or Shelter Needs: No; Support System Lacking: No; Transportation Concerns: No; Advanced Directives: Yes (Not Provided); Patient does not want information on Advanced Directives; Do not resuscitate: No; Living Will: Yes (Not Provided); Medical Power of Attorney: Yes (Not Provided) Physician Affirmation I have reviewed and agree with the above information. Electronic Signature(s) Signed: 11/07/2017 5:05:51 PM By: Montey Hora Signed: 11/10/2017 1:32:29 PM By: Worthy Keeler PA-C Entered By: Worthy Keeler on 11/07/2017 13:11:24 Bassett, [name] ([id number]) -------------------------------------------------------------------------------- SuperBill Details Patient Name: Venn, Nadeem S. Date of Service: 11/07/2017 Medical Record Number: [id number] Patient Account Number: [id number] Date of Birth/Sex: 05-04-1924 (84 y.o. M) Treating RN: Montey Hora Primary Care Provider: Deborra Medina Other Clinician: Referring Provider: Deborra Medina Treating Provider/Extender: Melburn Hake, HOYT Weeks in Treatment: 4 Diagnosis Coding ICD-10 Codes Code Description [id number] Type 2 diabetes mellitus with foot ulcer E11.40 Type 2 diabetes mellitus with diabetic neuropathy, unspecified L97.512 Non-pressure chronic ulcer of other part of right foot with fat layer exposed 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 N18.9 Chronic kidney disease, unspecified I73.9 Peripheral vascular disease, unspecified D62.22 Chronic systolic (congestive) heart failure I10 Essential (primary) hypertension Facility Procedures CPT4 Code: 97989211 Description: 94174 - DEBRIDE W/O ANES NON SELECT Modifier: Quantity: 1 Physician Procedures CPT4: Description Modifier Quantity Code [id number] - WC PHYS LEVEL 4 - EST PT 25 1 ICD-10 Diagnosis Description E11.621 Type 2 diabetes mellitus with foot ulcer E11.40 Type 2 diabetes mellitus with diabetic  neuropathy, unspecified L97.512  Non-pressure chronic ulcer of other part of right foot with fat layer exposed 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 Electronic Signature(s) Signed: 11/10/2017 1:32:29 PM By: Worthy Keeler PA-C Entered By: Worthy Keeler on 11/07/2017 13:24:46

## 2017-11-13 ENCOUNTER — Telehealth: Payer: Self-pay | Admitting: Internal Medicine

## 2017-11-13 DIAGNOSIS — E11621 Type 2 diabetes mellitus with foot ulcer: Secondary | ICD-10-CM | POA: Diagnosis not present

## 2017-11-13 DIAGNOSIS — I70203 Unspecified atherosclerosis of native arteries of extremities, bilateral legs: Secondary | ICD-10-CM | POA: Diagnosis not present

## 2017-11-13 DIAGNOSIS — L97513 Non-pressure chronic ulcer of other part of right foot with necrosis of muscle: Secondary | ICD-10-CM | POA: Diagnosis not present

## 2017-11-13 DIAGNOSIS — Z794 Long term (current) use of insulin: Secondary | ICD-10-CM | POA: Diagnosis not present

## 2017-11-13 DIAGNOSIS — M79604 Pain in right leg: Secondary | ICD-10-CM | POA: Diagnosis not present

## 2017-11-13 DIAGNOSIS — I10 Essential (primary) hypertension: Secondary | ICD-10-CM | POA: Diagnosis not present

## 2017-11-13 NOTE — Telephone Encounter (Signed)
Transition Care Management Follow-up Telephone Call  How have you been since you were released from the hospital? Patient said he is ding fine.   Do you understand why you were in the hospital? yes   Do you understand the discharge instrcutions? yes  Items Reviewed:  Medications reviewed: yes  Allergies reviewed: yes  Dietary changes reviewed: yes  Referrals reviewed: yes   Functional Questionnaire:   Activities of Daily Living (ADLs):   He states they are independent in the following: ambulation, bathing and hygiene, feeding, continence, grooming, toileting and dressing States they require assistance with the following: Patient daughter is helping.   Any transportation issues/concerns?: no   Any patient concerns? no   Confirmed importance and date/time of follow-up visits scheduled: yes   Confirmed with patient if condition begins to worsen call PCP or go to the ER.  Patient was given the Call-a-Nurse line [phone]: yes

## 2017-11-14 ENCOUNTER — Encounter: Payer: Medicare Other | Admitting: Physician Assistant

## 2017-11-14 ENCOUNTER — Encounter: Payer: Self-pay | Admitting: Internal Medicine

## 2017-11-14 ENCOUNTER — Ambulatory Visit (INDEPENDENT_AMBULATORY_CARE_PROVIDER_SITE_OTHER): Payer: Medicare Other | Admitting: Internal Medicine

## 2017-11-14 DIAGNOSIS — I7025 Atherosclerosis of native arteries of other extremities with ulceration: Secondary | ICD-10-CM | POA: Diagnosis not present

## 2017-11-14 DIAGNOSIS — Z09 Encounter for follow-up examination after completed treatment for conditions other than malignant neoplasm: Secondary | ICD-10-CM | POA: Diagnosis not present

## 2017-11-14 DIAGNOSIS — Z872 Personal history of diseases of the skin and subcutaneous tissue: Secondary | ICD-10-CM | POA: Diagnosis not present

## 2017-11-14 DIAGNOSIS — L03116 Cellulitis of left lower limb: Secondary | ICD-10-CM

## 2017-11-14 NOTE — Progress Notes (Signed)
Subjective:  Patient ID: [name], male    DOB: July 15, 1924  Age: 84 y.o. MRN: [id number]  CC: Diagnoses of Cellulitis of left foot, Atherosclerosis of native arteries of the extremities with ulceration ([place]), and Hospital discharge follow-up were pertinent to this visit.  HPI [name] presents for hospital follow up.  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  He was admitted to [facility] on Oct 4th after the physician at [facility]  felt that his wound had become complicated by cellulitis and cyanosis .  He was  treated in ED with IV abx and heparin  Drip   And seen b y vascular surgery PA in the ER,  Who agreed that admission was indicated for IV abx until the planned angiogram which was scheduled for  Oct 7 could be done.  Blood cultures were negative  ESR was 56 ,  CBC  Was normal during admission except for normocytic anemia . Angiogram noted calcifications without stenosis of the major arteries and significant stenosis of the smaller distal arteries was noted  No immediate plans for amputation were made,  Trial of antibiotics  For  2 weeks (cefdinir  300 mg bid ) )  with 10/16 follow up planned.    Since then I have had a conversation with daughte Kathryne Eriksson who is concerned that if amputation is needed,  That he be closer to his son who lives in [facility] and can provide support.  She requested referral to [facility] for their opinion and an appt is scheduled for the week of Oct 20th   Feeling better,  Pain under control with hydrocodone/tramadol .  Some itching of eyes and nose but no rash  ,  No system itching.  Has finally been  able to sleep supine for the last 2 days .  Ambulating well with a walker,  Getting PT and legs getting stronger   Right foot 3rd toe has a bleeding ulcer on plantar surface   Outpatient Medications Prior to Visit  Medication Sig Dispense Refill  . acetaminophen (TYLENOL) 325 MG tablet Take 2 tablets (650 mg  total) by mouth every 6 (six) hours as needed for mild pain (or Fever >/= 101).    Marland Kitchen amLODipine (NORVASC) 10 MG tablet TAKE 1 TABLET (10 MG TOTAL) BY MOUTH DAILY. (Patient taking differently: Take 10 mg by mouth every morning. ) 90 tablet 2  . aspirin 325 MG tablet Take 325 mg by mouth every evening.     Marland Kitchen atorvastatin (LIPITOR) 20 MG tablet TAKE 1 TABLET (20 MG TOTAL) BY MOUTH DAILY. (Patient taking differently: Take 20 mg by mouth daily at 6 PM. ) 90 tablet 1  . Calcium Carbonate-Vitamin D (CALCIUM + D) 600-200 MG-UNIT TABS Take 1 tablet by mouth 2 (two) times daily.      . cefdinir (OMNICEF) 300 MG capsule Take 1 capsule (300 mg total) by mouth every 12 (twelve) hours for 28 doses. 28 capsule 0  . clopidogrel (PLAVIX) 75 MG tablet TAKE 1 TABLET (75 MG TOTAL) BY MOUTH DAILY. 90 tablet 1  . collagenase (SANTYL) ointment Apply to wound twice a day (Patient taking differently: Apply 1 application topically daily. Apply to wound twice a day) 30 g 1  . cyanocobalamin (,VITAMIN B-12,) 1000 MCG/ML injection INJECT 1 ML (1,000 MCG TOTAL) INTO THE MUSCLE EVERY 30 (THIRTY) DAYS. 3 mL 3  . fenofibrate (TRICOR) 48 MG tablet  TAKE 1 TABLET BY MOUTH DAILY (Patient taking differently: Take 48 mg by mouth every morning. ) 90 tablet 1  . furosemide (LASIX) 20 MG tablet Take 2 tablets (40 mg total) by mouth daily. AS NEEDED FOR FLUID RETENTION (Patient taking differently: Take 20 mg by mouth 2 (two) times daily. ) 180 tablet 1  . glucose blood (ACCU-CHEK AVIVA PLUS) test strip CHECK BLOOD SUGAR 3 TIMES A DAY AS NEEDED DX E11.51 300 each 5  . HYDROcodone-acetaminophen (NORCO) 10-325 MG tablet Take 1 tablet by mouth every 6 (six) hours as needed. 30 tablet 0  . hydrocortisone 2.5 % cream Apply 1 application topically 2 (two) times daily as needed.    . insulin aspart protamine - aspart (NOVOLOG MIX 70/30 FLEXPEN) (70-30) 100 UNIT/ML FlexPen INJECT 18 units two times daily , adjust dose using sliding scale 75 pen 1  .  Insulin Pen Needle (B-D ULTRAFINE III SHORT PEN) 31G X 8 MM MISC USE AS DIRECTED 3 TIMES A DAY 300 each 5  . Insulin Syringe-Needle U-100 (B-D INS SYRINGE 0.5CC/31GX5/16) 31G X 5/16" 0.5 ML MISC 1 application by Does not apply route 3 (three) times daily before meals. 300 each 3  . latanoprost (XALATAN) 0.005 % ophthalmic solution Place 1 drop into both eyes at bedtime.  3  . losartan (COZAAR) 100 MG tablet TAKE 1 TABLET BY MOUTH EVERY DAY (Patient taking differently: Take 100 mg by mouth every morning. ) 90 tablet 1  . metoprolol tartrate (LOPRESSOR) 100 MG tablet TAKE 1 TABLET BY MOUTH TWICE A DAY (Patient taking differently: Take 50 mg by mouth 2 (two) times daily. ) 180 tablet 1  . Multiple Vitamin (MULTIVITAMIN) tablet Take 1 tablet by mouth every evening.     . polyethylene glycol (MIRALAX / GLYCOLAX) packet Take 17 g by mouth daily as needed for mild constipation. 14 each 0  . protein supplement shake (PREMIER PROTEIN) LIQD Take 325 mLs (11 oz total) by mouth 2 (two) times daily between meals. 60 Can 0  . Syringe, Disposable, 3 ML MISC For use with B12 injections weekly/monthly 25 each 0  . SYRINGE-NEEDLE, DISP, 3 ML (B-D 3CC LUER-LOK SYR 25GX1") 25G X 1" 3 ML MISC FOR USE WITH B12 INJECTIONS WEEKLY/MONTHLY 50 each 1  . traMADol (ULTRAM) 50 MG tablet Take 1 tablet (50 mg total) by mouth 2 (two) times daily. 15 tablet 0  . insulin aspart (NOVOLOG) 100 UNIT/ML injection Inject 0-9 Units into the skin 3 (three) times daily with meals. 0-9 Units, Subcutaneous, 3 times daily with meals, First dose on Fri 11/07/17 at 1700 Correction coverage: Sensitive (thin, NPO, renal) CBG < 70: implement hypoglycemia protocol CBG 70 - 120: 0 units CBG 121 - 150: 1 unit CBG 151 - 200: 2 units CBG 201 - 250: 3 units CBG 251 - 300: 5 units CBG 301 - 350: 7 units CBG 351 - 400: 9 units CBG > 400: call MD and obtain STAT lab verification (Patient not taking: Reported on 11/14/2017) 10 mL 11  . insulin aspart  (NOVOLOG) 100 UNIT/ML injection Inject 0-5 Units into the skin at bedtime. (Patient not taking: Reported on 11/14/2017) 10 mL 11   No facility-administered medications prior to visit.     Review of Systems;  Patient denies headache, fevers, malaise, unintentional weight loss, skin rash, eye pain, sinus congestion and sinus pain, sore throat, dysphagia,  hemoptysis , cough, dyspnea, wheezing, chest pain, palpitations, orthopnea, edema, abdominal pain, nausea, melena, diarrhea, constipation, flank pain,  dysuria, hematuria, urinary  Frequency, nocturia, numbness, tingling, seizures,  Focal weakness, Loss of consciousness,  Tremor, insomnia, depression, anxiety, and suicidal ideation.      Objective:  BP (!) 110/58 (BP Location: Right Arm, Patient Position: Sitting, Cuff Size: Normal)   Pulse 74   Temp 97.8 F (36.6 C) (Oral)   Wt 169 lb 12.8 oz (77 kg)   SpO2 97%   BMI 25.82 kg/m   BP Readings from Last 3 Encounters:  11/14/17 (!) 110/58  11/11/17 (!) 158/47  10/29/17 (!) 133/43    Wt Readings from Last 3 Encounters:  11/14/17 169 lb 12.8 oz (77 kg)  11/11/17 156 lb 15.5 oz (71.2 kg)  10/27/17 171 lb (77.6 kg)    General appearance: alert, cooperative and appears stated age Ears: normal TM's and external ear canals both ears Throat: lips, mucosa, and tongue normal; teeth and gums normal Neck: no adenopathy, no carotid bruit, supple, symmetrical, trachea midline and thyroid not enlarged, symmetric, no tenderness/mass/nodules Back: symmetric, no curvature. ROM normal. No CVA tenderness. Lungs: clear to auscultation bilaterally Heart: regular rate and rhythm, S1, S2 normal, no murmur, click, rub or gallop Abdomen: soft, non-tender; bowel sounds normal; no masses,  no organomegaly Pulses: 2+ and symmetric Skin: Skin color, texture, turgor normal. No rashes or lesions Lymph nodes: Cervical, supraclavicular, and axillary nodes normal.  Lab Results  Component Value Date   HGBA1C  7.2 (H) 11/07/2017   HGBA1C 6.8 (H) 07/09/2017   HGBA1C 7.5 (H) 04/07/2017    Lab Results  Component Value Date   CREATININE 1.94 (H) 11/10/2017   CREATININE 1.91 (H) 11/09/2017   CREATININE 2.07 (H) 11/08/2017    Lab Results  Component Value Date   WBC 7.2 11/10/2017   HGB 10.0 (L) 11/10/2017   HCT 29.6 (L) 11/10/2017   PLT 291 11/10/2017   GLUCOSE 124 (H) 11/10/2017   CHOL 102 04/07/2017   TRIG 119.0 04/07/2017   HDL 34.00 (L) 04/07/2017   LDLDIRECT 49.0 06/26/2015   LDLCALC 44 04/07/2017   ALT 13 11/07/2017   AST 16 11/07/2017   NA 139 11/10/2017   K 4.0 11/10/2017   CL 102 11/10/2017   CREATININE 1.94 (H) 11/10/2017   BUN 32 (H) 11/10/2017   CO2 24 11/10/2017   TSH 1.49 06/13/2014   INR 1.11 11/07/2017   HGBA1C 7.2 (H) 11/07/2017   MICROALBUR 3.1 (H) 04/07/2017    Dg Foot Complete Left  Result Date: 11/07/2017 CLINICAL DATA:  Redness and swelling after toe amputation 2 weeks ago. EXAM: LEFT FOOT - COMPLETE 3+ VIEW COMPARISON:  CT LEFT foot October 22, 2017 FINDINGS: New erosive changes third metatarsal head, status post resection of third phalanges. No acute fracture deformity. No dislocation. Mild first MTP osteoarthrosis. Slight widening of the second MTP joint space. Type 2 navicular bone. Severe vascular calcifications. Soft tissue swelling without subcutaneous gas or radiopaque foreign bodies. IMPRESSION: 1. S/p third toe amputation. New postoperative defect third metatarsal head versus osteomyelitis. 2. Second MTP joint space widening seen with effusion or projectional artifact. 3. Soft tissue swelling, no subcutaneous gas. Electronically Signed   By: Elon Alas M.D.   On: 11/07/2017 13:51    Assessment & Plan:   Problem List Items Addressed This Visit    Atherosclerosis of native arteries of the extremities with ulceration ([place])    S/p angiogram last week .  He has distal runoff via the peroneal artery only, ending above the ankle with significant  collateralization and small  vessel disease noted.  .His pain has improved and the foot is warm. There no signs of immediate compromise.        Cellulitis of left foot    Clinically resolved with cefdinir.       Hospital discharge follow-up    Patient is stable post discharge and has no new issues or questions about discharge plans at the visit today for hospital follow up. All labs , imaging studies and progress notes from admission were reviewed with patient today .          I have discontinued [name]. Stowell's insulin aspart and insulin aspart. I am also having him maintain his aspirin, Calcium Carbonate-Vitamin D, hydrocortisone, latanoprost, Syringe (Disposable), Insulin Syringe-Needle U-100, SYRINGE-NEEDLE (DISP) 3 ML, Insulin Pen Needle, metoprolol tartrate, furosemide, amLODipine, glucose blood, multivitamin, collagenase, cyanocobalamin, atorvastatin, clopidogrel, fenofibrate, losartan, acetaminophen, polyethylene glycol, traMADol, protein supplement shake, insulin aspart protamine - aspart, HYDROcodone-acetaminophen, and cefdinir.  No orders of the defined types were placed in this encounter.   Medications Discontinued During This Encounter  Medication Reason  . insulin aspart (NOVOLOG) 100 UNIT/ML injection Change in therapy  . insulin aspart (NOVOLOG) 100 UNIT/ML injection Completed Course    Follow-up: No follow-ups on file.   Crecencio Mc, MD

## 2017-11-14 NOTE — Patient Instructions (Addendum)
Your Wound is looking better and the cellulitis has resolved.  If you develop fever,  ( T > 100.4),  Nausea,  Increasing foot pain,  Or redness that spreads from the foot up to the ankle ,  You should have your family take you to the [facility] ER   You can try claritin or allegra once daily for the itching  you are having     Allegra is available generically as fexofenadine and it comes in 60 mg and 180 mg once daily strengths.  The claritin is also available generically as loratidine .    The toe on your right foot is bleeding on the bottom.  This is aggravated by walking,  Standing,  And sitting with your feet on the floor .    If you can tolerate resting with a pillow under your lower legs to take the pressure off of your heels   This will prevent heel ulcers from forming

## 2017-11-16 DIAGNOSIS — Z09 Encounter for follow-up examination after completed treatment for conditions other than malignant neoplasm: Secondary | ICD-10-CM | POA: Insufficient documentation

## 2017-11-16 NOTE — Assessment & Plan Note (Addendum)
Clinically resolved with cefdinir.

## 2017-11-16 NOTE — Assessment & Plan Note (Signed)
Patient is stable post discharge and has no new issues or questions about discharge plans at the visit today for hospital follow up. All labs , imaging studies and progress notes from admission were reviewed with patient today   

## 2017-11-16 NOTE — Assessment & Plan Note (Addendum)
S/p angiogram last week .  He has distal runoff via the peroneal artery only, ending above the ankle with significant collateralization and small vessel disease noted.  .His pain has improved and the foot is warm. There no signs of immediate compromise.

## 2017-11-17 ENCOUNTER — Inpatient Hospital Stay: Payer: Medicare Other | Admitting: Internal Medicine

## 2017-11-17 ENCOUNTER — Ambulatory Visit: Payer: Medicare Other | Admitting: Nurse Practitioner

## 2017-11-18 ENCOUNTER — Telehealth (INDEPENDENT_AMBULATORY_CARE_PROVIDER_SITE_OTHER): Payer: Self-pay | Admitting: Vascular Surgery

## 2017-11-18 DIAGNOSIS — I10 Essential (primary) hypertension: Secondary | ICD-10-CM | POA: Diagnosis not present

## 2017-11-18 DIAGNOSIS — I70203 Unspecified atherosclerosis of native arteries of extremities, bilateral legs: Secondary | ICD-10-CM | POA: Diagnosis not present

## 2017-11-18 DIAGNOSIS — M79604 Pain in right leg: Secondary | ICD-10-CM | POA: Diagnosis not present

## 2017-11-18 DIAGNOSIS — E11621 Type 2 diabetes mellitus with foot ulcer: Secondary | ICD-10-CM | POA: Diagnosis not present

## 2017-11-18 DIAGNOSIS — L97513 Non-pressure chronic ulcer of other part of right foot with necrosis of muscle: Secondary | ICD-10-CM | POA: Diagnosis not present

## 2017-11-18 DIAGNOSIS — Z794 Long term (current) use of insulin: Secondary | ICD-10-CM | POA: Diagnosis not present

## 2017-11-18 NOTE — Telephone Encounter (Signed)
All patients have to come into the office to be seen by their provider in order to receive their CT results, every time.

## 2017-11-19 ENCOUNTER — Other Ambulatory Visit (INDEPENDENT_AMBULATORY_CARE_PROVIDER_SITE_OTHER): Payer: Self-pay

## 2017-11-19 ENCOUNTER — Ambulatory Visit (INDEPENDENT_AMBULATORY_CARE_PROVIDER_SITE_OTHER): Payer: Medicare Other | Admitting: Nurse Practitioner

## 2017-11-19 ENCOUNTER — Encounter (INDEPENDENT_AMBULATORY_CARE_PROVIDER_SITE_OTHER): Payer: Self-pay | Admitting: Nurse Practitioner

## 2017-11-19 VITALS — BP 131/72 | HR 91 | Resp 17 | Ht 64.0 in | Wt 169.0 lb

## 2017-11-19 DIAGNOSIS — Z794 Long term (current) use of insulin: Secondary | ICD-10-CM

## 2017-11-19 DIAGNOSIS — E1121 Type 2 diabetes mellitus with diabetic nephropathy: Secondary | ICD-10-CM

## 2017-11-19 DIAGNOSIS — L97513 Non-pressure chronic ulcer of other part of right foot with necrosis of muscle: Secondary | ICD-10-CM | POA: Diagnosis not present

## 2017-11-19 DIAGNOSIS — I7025 Atherosclerosis of native arteries of other extremities with ulceration: Secondary | ICD-10-CM | POA: Diagnosis not present

## 2017-11-19 DIAGNOSIS — E11621 Type 2 diabetes mellitus with foot ulcer: Secondary | ICD-10-CM | POA: Diagnosis not present

## 2017-11-19 DIAGNOSIS — E785 Hyperlipidemia, unspecified: Secondary | ICD-10-CM | POA: Diagnosis not present

## 2017-11-19 DIAGNOSIS — M79604 Pain in right leg: Secondary | ICD-10-CM | POA: Diagnosis not present

## 2017-11-19 DIAGNOSIS — I10 Essential (primary) hypertension: Secondary | ICD-10-CM | POA: Diagnosis not present

## 2017-11-19 DIAGNOSIS — Z87891 Personal history of nicotine dependence: Secondary | ICD-10-CM

## 2017-11-19 DIAGNOSIS — I70203 Unspecified atherosclerosis of native arteries of extremities, bilateral legs: Secondary | ICD-10-CM | POA: Diagnosis not present

## 2017-11-19 DIAGNOSIS — Z89422 Acquired absence of other left toe(s): Secondary | ICD-10-CM | POA: Diagnosis not present

## 2017-11-19 MED ORDER — SILVER SULFADIAZINE 1 % EX CREA: 1.0000 "application " | TOPICAL_CREAM | Freq: Every day | CUTANEOUS | 0 refills | Status: AC

## 2017-11-20 DIAGNOSIS — L97513 Non-pressure chronic ulcer of other part of right foot with necrosis of muscle: Secondary | ICD-10-CM | POA: Diagnosis not present

## 2017-11-20 DIAGNOSIS — Z794 Long term (current) use of insulin: Secondary | ICD-10-CM | POA: Diagnosis not present

## 2017-11-20 DIAGNOSIS — I10 Essential (primary) hypertension: Secondary | ICD-10-CM | POA: Diagnosis not present

## 2017-11-20 DIAGNOSIS — E11621 Type 2 diabetes mellitus with foot ulcer: Secondary | ICD-10-CM | POA: Diagnosis not present

## 2017-11-20 DIAGNOSIS — I70203 Unspecified atherosclerosis of native arteries of extremities, bilateral legs: Secondary | ICD-10-CM | POA: Diagnosis not present

## 2017-11-20 DIAGNOSIS — M79604 Pain in right leg: Secondary | ICD-10-CM | POA: Diagnosis not present

## 2017-11-21 ENCOUNTER — Encounter: Payer: Medicare Other | Admitting: Physician Assistant

## 2017-11-21 DIAGNOSIS — N186 End stage renal disease: Secondary | ICD-10-CM | POA: Diagnosis not present

## 2017-11-21 DIAGNOSIS — L97512 Non-pressure chronic ulcer of other part of right foot with fat layer exposed: Secondary | ICD-10-CM | POA: Insufficient documentation

## 2017-11-21 DIAGNOSIS — E1140 Type 2 diabetes mellitus with diabetic neuropathy, unspecified: Secondary | ICD-10-CM | POA: Diagnosis not present

## 2017-11-21 DIAGNOSIS — I132 Hypertensive heart and chronic kidney disease with heart failure and with stage 5 chronic kidney disease, or end stage renal disease: Secondary | ICD-10-CM | POA: Diagnosis not present

## 2017-11-21 DIAGNOSIS — E1122 Type 2 diabetes mellitus with diabetic chronic kidney disease: Secondary | ICD-10-CM | POA: Insufficient documentation

## 2017-11-21 DIAGNOSIS — I5022 Chronic systolic (congestive) heart failure: Secondary | ICD-10-CM | POA: Insufficient documentation

## 2017-11-21 DIAGNOSIS — E11621 Type 2 diabetes mellitus with foot ulcer: Secondary | ICD-10-CM | POA: Insufficient documentation

## 2017-11-21 DIAGNOSIS — L97526 Non-pressure chronic ulcer of other part of left foot with bone involvement without evidence of necrosis: Secondary | ICD-10-CM | POA: Insufficient documentation

## 2017-11-21 DIAGNOSIS — L97522 Non-pressure chronic ulcer of other part of left foot with fat layer exposed: Secondary | ICD-10-CM | POA: Diagnosis not present

## 2017-11-23 ENCOUNTER — Encounter (INDEPENDENT_AMBULATORY_CARE_PROVIDER_SITE_OTHER): Payer: Self-pay | Admitting: Nurse Practitioner

## 2017-11-23 NOTE — Progress Notes (Signed)
Subjective:    Patient ID: [name], male    DOB: [birth year], 84 y.o.   MRN: [id number] Chief Complaint  Patient presents with  . Follow-up    3 week [facility] wound check    HPI  [name] is a 84 y.o. male that patient underwent amputation of his left third toe due to ulceration and osteomyelitis.  He presents today for a wound check as well as suture removal.  [name] also has underwent multiple angiogram of his left lower extremity.  Most recent on 11/10/2017.  He has small vessel disease present.  Today his wound is well approximated however there is weeping of his upper foot.  There is a slight maceration of the tissue around toe.  His fifth toe has a slight discoloration and redness.  A picture is attached reference.  The patient is currently on antibiotics until 11/25/2017.  The patient denies any fever, chills, nausea, vomiting, diarrhea.  He states that the sharp shooting pain that he was having his foot is now gone.  Now he currently feels no pain.  He denies any chest pain, shortness of breath, or claudication-like symptoms.        Past Medical History:  Diagnosis Date  . Arthritis   . Blood transfusion   . Cancer ([place])    skin cancer on forehead  . Coronary artery disease    h/o bypass coronary- 2001 & peripheral- 2011 , last full cardiac visit  in [place] , [place], [employer]  . Diabetes mellitus   . H/O: malaria 89   in [place]  . Hyperlipidemia   . Hypertension   . Peripheral artery disease ([place])    folowed by [place] bypass 2011 [place]  . Renal insufficiency    by Nov 2012 labs, no old records available    Social History   Socioeconomic History  . Marital status: Widowed    Spouse name: Silva Bandy   . Number of children: 3  . Years of education: 96  . Highest education level: Not on file  Occupational History  . Occupation: retired  Scientific laboratory technician  . Financial resource strain: Not very hard  . Food insecurity:    Worry: Never  true    Inability: Not on file  . Transportation needs:    Medical: No    Non-medical: No  Tobacco Use  . Smoking status: Former Smoker    Last attempt to quit: 09/27/1985    Years since quitting: 32.1  . Smokeless tobacco: Never Used  Substance and Sexual Activity  . Alcohol use: No    Comment: occassional  . Drug use: No  . Sexual activity: Never  Lifestyle  . Physical activity:    Days per week: 0 days    Minutes per session: 0 min  . Stress: Not on file  Relationships  . Social connections:    Talks on phone: More than three times a week    Gets together: More than three times a week    Attends religious service: Not on file    Active member of club or organization: Not on file    Attends meetings of clubs or organizations: Not on file    Relationship status: Widowed  . Intimate partner violence:    Fear of current or ex partner: No    Emotionally abused: No    Physically abused: No    Forced sexual activity: No  Other Topics Concern  . Not on file  Social History Narrative   Widowed, May 2016     Past Surgical History:  Procedure Laterality Date  . AMPUTATION Left 10/29/2017   Procedure: AMPUTATION RAY ( THIRD TOE );  Surgeon: Algernon Huxley, MD;  Location: [facility] ORS;  Service: Vascular;  Laterality: Left;  . CORONARY ARTERY BYPASS GRAFT  2001   4 vessel[place]   . FEMORAL BYPASS  2011   done in [place],  now followed by Leotis Pain  . INSERT / REPLACE / REMOVE PACEMAKER     2004  . LOWER EXTREMITY ANGIOGRAPHY Left 08/18/2017   Procedure: LOWER EXTREMITY ANGIOGRAPHY;  Surgeon: Algernon Huxley, MD;  Location: [facility];  Service: Cardiovascular;  Laterality: Left;  . LOWER EXTREMITY ANGIOGRAPHY Left 10/20/2017   Procedure: LOWER EXTREMITY ANGIOGRAPHY;  Surgeon: Algernon Huxley, MD;  Location: [facility];  Service: Cardiovascular;  Laterality: Left;  . LOWER EXTREMITY ANGIOGRAPHY Right 10/27/2017   Procedure: LOWER EXTREMITY ANGIOGRAPHY;  Surgeon:  Algernon Huxley, MD;  Location: [facility];  Service: Cardiovascular;  Laterality: Right;  . LOWER EXTREMITY ANGIOGRAPHY Left 11/10/2017   Procedure: Lower Extremity Angiography;  Surgeon: Algernon Huxley, MD;  Location: [facility];  Service: Cardiovascular;  Laterality: Left;  . LUMBAR LAMINECTOMY/DECOMPRESSION MICRODISCECTOMY  01/16/2011   Procedure: LUMBAR LAMINECTOMY/DECOMPRESSION MICRODISCECTOMY;  Surgeon: Ophelia Charter;  Location: [facility];  Service: Neurosurgery;  Laterality: N/A;  Lumbar four and lumbar five laminectomies   . PACEMAKER INSERTION    . SPINE SURGERY      Family History  Problem Relation Age of Onset  . Mental illness Mother 33       alzheimers  . Peripheral vascular disease Father   . Diabetes Father   . Anesthesia problems Neg Hx   . Hypotension Neg Hx   . Malignant hyperthermia Neg Hx   . Pseudochol deficiency Neg Hx     Allergies  Allergen Reactions  . Hydrocodone-Acetaminophen Other (See Comments)    Hallucinations at higher doses  . Lyrica [Pregabalin] Other (See Comments)    dizziness  . Tramadol Other (See Comments)    Hallucinations with higher doses  . Trazodone And Nefazodone Other (See Comments)    tachycardia     Review of Systems   Review of Systems: Negative Unless Checked Constitutional: [] Weight loss  [] Fever  [] Chills Cardiac: [] Chest pain   []  Atrial Fibrillation  [] Palpitations   [] Shortness of breath when laying flat   [] Shortness of breath with exertion. Vascular:  [] Pain in legs with walking   [] Pain in legs with standing  [] History of DVT   [] Phlebitis   [] Swelling in legs   [] Varicose veins   [x] Non-healing ulcers Pulmonary:   [] Uses home oxygen   [] Productive cough   [] Hemoptysis   [] Wheeze  [] COPD   [] Asthma Neurologic:  [] Dizziness   [] Seizures   [] History of stroke   [] History of TIA  [] Aphasia   [] Vissual changes   [] Weakness or numbness in arm   [] Weakness or numbness in leg Musculoskeletal:   [] Joint  swelling   [] Joint pain   [] Low back pain  []  History of Knee Replacement Hematologic:  [] Easy bruising  [] Easy bleeding   [] Hypercoagulable state   [] Anemic Gastrointestinal:  [] Diarrhea   [] Vomiting  [] Gastroesophageal reflux/heartburn   [] Difficulty swallowing. Genitourinary:  [x] Chronic kidney disease   [] Difficult urination  [] Anuric   [] Blood in urine Skin:  [] Rashes   [] Ulcers  Psychological:  [] History  of anxiety   []  History of major depression  []  Memory Difficulties     Objective:   Physical Exam  BP 131/72 (BP Location: Left Arm, Patient Position: Sitting)   Pulse 91   Resp 17   Ht 5\' 4"  (1.626 m)   Wt 169 lb (76.7 kg)   BMI 29.01 kg/m   Gen: WD/WN, NAD Head: [place]/AT, No temporalis wasting.  Ear/Nose/Throat: Hearing grossly intact, nares w/o erythema or drainage Eyes: PER, EOMI, sclera nonicteric.  Neck: Supple, no masses.  No JVD.  Pulmonary:  Good air movement, no use of accessory muscles.  Cardiac: RRR Vascular: weeping on top of foot. See attached pic Vessel Right Left  Radial Palpable Palpable  Dorsalis Pedis Not Palpable Not Palpable  Posterior Tibial Not Palpable Not Palpable   Gastrointestinal: soft, non-distended. No guarding/no peritoneal signs.  Musculoskeletal: wheelchair bound.  No third digit left foot Neurologic: Pain and light touch intact in extremities.  Symmetrical.  Speech is fluent. Motor exam as listed above. Psychiatric: Judgment intact, Mood & affect appropriate for pt's clinical situation. Dermatologic: No Venous rashes. Small dark area left fifth toe  No changes consistent with cellulitis. Lymph : No Cervical lymphadenopathy, no lichenification or skin changes of chronic lymphedema.      Assessment & Plan:   1. Atherosclerosis of native arteries of the extremities with ulceration ([place]) Wound appears to be healing.  The area of weeping is somewhat concerning as well as the darkened area on the fifth toe.  The patient will follow-up next  week with bilateral ABIs.  We will evaluate the status of the foot at that time.  The patient has been given sulfa Silvadene cream to apply to the area.  2. Type 2 diabetes mellitus with diabetic nephropathy, with long-term current use of insulin ([place]) Continue hypoglycemic medications as already ordered, these medications have been reviewed and there are no changes at this time.  Hgb A1C to be monitored as already arranged by primary service   3. Hyperlipidemia with target LDL less than 70 Continue statin as ordered and reviewed, no changes at this time    Current Outpatient Medications on File Prior to Visit  Medication Sig Dispense Refill  . acetaminophen (TYLENOL) 325 MG tablet Take 2 tablets (650 mg total) by mouth every 6 (six) hours as needed for mild pain (or Fever >/= 101).    Marland Kitchen amLODipine (NORVASC) 10 MG tablet TAKE 1 TABLET (10 MG TOTAL) BY MOUTH DAILY. (Patient taking differently: Take 10 mg by mouth every morning. ) 90 tablet 2  . aspirin 325 MG tablet Take 325 mg by mouth every evening.     Marland Kitchen atorvastatin (LIPITOR) 20 MG tablet TAKE 1 TABLET (20 MG TOTAL) BY MOUTH DAILY. (Patient taking differently: Take 20 mg by mouth daily at 6 PM. ) 90 tablet 1  . Calcium Carbonate-Vitamin D (CALCIUM + D) 600-200 MG-UNIT TABS Take 1 tablet by mouth 2 (two) times daily.      . cefdinir (OMNICEF) 300 MG capsule Take 1 capsule (300 mg total) by mouth every 12 (twelve) hours for 28 doses. 28 capsule 0  . clopidogrel (PLAVIX) 75 MG tablet TAKE 1 TABLET (75 MG TOTAL) BY MOUTH DAILY. 90 tablet 1  . collagenase (SANTYL) ointment Apply to wound twice a day (Patient taking differently: Apply 1 application topically daily. Apply to wound twice a day) 30 g 1  . cyanocobalamin (,VITAMIN B-12,) 1000 MCG/ML injection INJECT 1 ML (1,000 MCG TOTAL) INTO THE MUSCLE  EVERY 30 (THIRTY) DAYS. 3 mL 3  . fenofibrate (TRICOR) 48 MG tablet TAKE 1 TABLET BY MOUTH DAILY (Patient taking differently: Take 48 mg by mouth  every morning. ) 90 tablet 1  . furosemide (LASIX) 20 MG tablet Take 2 tablets (40 mg total) by mouth daily. AS NEEDED FOR FLUID RETENTION (Patient taking differently: Take 20 mg by mouth 2 (two) times daily. ) 180 tablet 1  . glucose blood (ACCU-CHEK AVIVA PLUS) test strip CHECK BLOOD SUGAR 3 TIMES A DAY AS NEEDED DX E11.51 300 each 5  . HYDROcodone-acetaminophen (NORCO) 10-325 MG tablet Take 1 tablet by mouth every 6 (six) hours as needed. 30 tablet 0  . hydrocortisone 2.5 % cream Apply 1 application topically 2 (two) times daily as needed.    . insulin aspart protamine - aspart (NOVOLOG MIX 70/30 FLEXPEN) (70-30) 100 UNIT/ML FlexPen INJECT 18 units two times daily , adjust dose using sliding scale 75 pen 1  . Insulin Pen Needle (B-D ULTRAFINE III SHORT PEN) 31G X 8 MM MISC USE AS DIRECTED 3 TIMES A DAY 300 each 5  . Insulin Syringe-Needle U-100 (B-D INS SYRINGE 0.5CC/31GX5/16) 31G X 5/16" 0.5 ML MISC 1 application by Does not apply route 3 (three) times daily before meals. 300 each 3  . latanoprost (XALATAN) 0.005 % ophthalmic solution Place 1 drop into both eyes at bedtime.  3  . losartan (COZAAR) 100 MG tablet TAKE 1 TABLET BY MOUTH EVERY DAY (Patient taking differently: Take 100 mg by mouth every morning. ) 90 tablet 1  . metoprolol tartrate (LOPRESSOR) 100 MG tablet TAKE 1 TABLET BY MOUTH TWICE A DAY (Patient taking differently: Take 50 mg by mouth 2 (two) times daily. ) 180 tablet 1  . Multiple Vitamin (MULTIVITAMIN) tablet Take 1 tablet by mouth every evening.     . polyethylene glycol (MIRALAX / GLYCOLAX) packet Take 17 g by mouth daily as needed for mild constipation. 14 each 0  . protein supplement shake (PREMIER PROTEIN) LIQD Take 325 mLs (11 oz total) by mouth 2 (two) times daily between meals. 60 Can 0  . Syringe, Disposable, 3 ML MISC For use with B12 injections weekly/monthly 25 each 0  . SYRINGE-NEEDLE, DISP, 3 ML (B-D 3CC LUER-LOK SYR 25GX1") 25G X 1" 3 ML MISC FOR USE WITH B12  INJECTIONS WEEKLY/MONTHLY 50 each 1  . traMADol (ULTRAM) 50 MG tablet Take 1 tablet (50 mg total) by mouth 2 (two) times daily. 15 tablet 0   No current facility-administered medications on file prior to visit.     There are no Patient Instructions on file for this visit. No follow-ups on file.   Kris Hartmann, NP

## 2017-11-25 ENCOUNTER — Ambulatory Visit (INDEPENDENT_AMBULATORY_CARE_PROVIDER_SITE_OTHER): Payer: Medicare Other

## 2017-11-25 ENCOUNTER — Encounter (INDEPENDENT_AMBULATORY_CARE_PROVIDER_SITE_OTHER): Payer: Self-pay | Admitting: Vascular Surgery

## 2017-11-25 ENCOUNTER — Ambulatory Visit (INDEPENDENT_AMBULATORY_CARE_PROVIDER_SITE_OTHER): Payer: Medicare Other | Admitting: Vascular Surgery

## 2017-11-25 VITALS — BP 112/58 | HR 60 | Resp 17 | Ht 68.0 in | Wt 167.0 lb

## 2017-11-25 DIAGNOSIS — N183 Chronic kidney disease, stage 3 unspecified: Secondary | ICD-10-CM

## 2017-11-25 DIAGNOSIS — I70203 Unspecified atherosclerosis of native arteries of extremities, bilateral legs: Secondary | ICD-10-CM | POA: Diagnosis not present

## 2017-11-25 DIAGNOSIS — I10 Essential (primary) hypertension: Secondary | ICD-10-CM

## 2017-11-25 DIAGNOSIS — E1121 Type 2 diabetes mellitus with diabetic nephropathy: Secondary | ICD-10-CM | POA: Diagnosis not present

## 2017-11-25 DIAGNOSIS — I739 Peripheral vascular disease, unspecified: Secondary | ICD-10-CM | POA: Diagnosis not present

## 2017-11-25 DIAGNOSIS — I7025 Atherosclerosis of native arteries of other extremities with ulceration: Secondary | ICD-10-CM

## 2017-11-25 DIAGNOSIS — E11621 Type 2 diabetes mellitus with foot ulcer: Secondary | ICD-10-CM | POA: Diagnosis not present

## 2017-11-25 DIAGNOSIS — Z794 Long term (current) use of insulin: Secondary | ICD-10-CM

## 2017-11-25 DIAGNOSIS — I129 Hypertensive chronic kidney disease with stage 1 through stage 4 chronic kidney disease, or unspecified chronic kidney disease: Secondary | ICD-10-CM | POA: Diagnosis not present

## 2017-11-25 DIAGNOSIS — M79604 Pain in right leg: Secondary | ICD-10-CM | POA: Diagnosis not present

## 2017-11-25 DIAGNOSIS — L97513 Non-pressure chronic ulcer of other part of right foot with necrosis of muscle: Secondary | ICD-10-CM | POA: Diagnosis not present

## 2017-11-25 NOTE — Assessment & Plan Note (Signed)
His ABIs remain reduced although he does have strong monophasic waveforms at the ankle.  His digit pressures are basically undetectable at this point.  This correlates with the findings on his recent angiograms of small vessel disease predominantly at this point. Symptomatically, he is much better.  I think we should try to come off his antibiotics and see how his foot does.  He has a scheduled follow-up appointment in December and he should keep that appointment.  I will be happy to see him back sooner if problems develop in the interim.

## 2017-11-25 NOTE — Patient Instructions (Signed)

## 2017-11-25 NOTE — Assessment & Plan Note (Signed)
blood pressure control important in reducing the progression of atherosclerotic disease. On appropriate oral medications.  

## 2017-11-25 NOTE — Assessment & Plan Note (Signed)
blood glucose control important in reducing the progression of atherosclerotic disease. Also, involved in wound healing. On appropriate medications.  

## 2017-11-25 NOTE — Progress Notes (Signed)
Patient ID: [name], male   DOB: 02-19-1924, 84 y.o.   MRN: [id number]  Chief Complaint  Patient presents with  . Follow-up    5 week [facility] ABI check    HPI [name] is a 83 y.o. male.  Patient returns in follow up.  He says the sharp stabbing pain he was having in his foot is much better.  He will complete his antibiotics this week.  He has stopped all pain medications and neuropathy medications.  He is walking and he says he feels much better.  His ABIs remain reduced although he does have strong monophasic waveforms at the ankle.  His digit pressures are basically undetectable at this point.  This correlates with the findings on his recent angiograms of small vessel disease predominantly at this point.   Past Medical History:  Diagnosis Date  . Arthritis   . Blood transfusion   . Cancer ([place])    skin cancer on forehead  . Coronary artery disease    h/o bypass coronary- 2001 & peripheral- 2011 , last full cardiac visit  in [place] , [place], [employer]  . Diabetes mellitus   . H/O: malaria 50   in [place]  . Hyperlipidemia   . Hypertension   . Peripheral artery disease ([place])    folowed by [facility] post bypass 2011 [place]  . Renal insufficiency    by Nov 2012 labs, no old records available    Past Surgical History:  Procedure Laterality Date  . AMPUTATION Left 10/29/2017   Procedure: AMPUTATION RAY ( THIRD TOE );  Surgeon: Algernon Huxley, [place];  Location: [facility] ORS;  Service: Vascular;  Laterality: Left;  . CORONARY ARTERY BYPASS GRAFT  2001   4 vessel, done [place]   . FEMORAL BYPASS  2011   done in [place],  now followed by Leotis Pain  . INSERT / REPLACE / REMOVE PACEMAKER     2004  . LOWER EXTREMITY ANGIOGRAPHY Left 08/18/2017   Procedure: LOWER EXTREMITY ANGIOGRAPHY;  Surgeon: Algernon Huxley, MD;  Location: [facility];  Service: Cardiovascular;  Laterality: Left;  . LOWER EXTREMITY ANGIOGRAPHY Left 10/20/2017   Procedure: LOWER EXTREMITY  ANGIOGRAPHY;  Surgeon: Algernon Huxley, MD;  Location: [facility];  Service: Cardiovascular;  Laterality: Left;  . LOWER EXTREMITY ANGIOGRAPHY Right 10/27/2017   Procedure: LOWER EXTREMITY ANGIOGRAPHY;  Surgeon: Algernon Huxley, MD;  Location: [facility];  Service: Cardiovascular;  Laterality: Right;  . LOWER EXTREMITY ANGIOGRAPHY Left 11/10/2017   Procedure: Lower Extremity Angiography;  Surgeon: Algernon Huxley, MD;  Location: [facility];  Service: Cardiovascular;  Laterality: Left;  . LUMBAR LAMINECTOMY/DECOMPRESSION MICRODISCECTOMY  01/16/2011   Procedure: LUMBAR LAMINECTOMY/DECOMPRESSION MICRODISCECTOMY;  Surgeon: Ophelia Charter;  Location: [facility];  Service: Neurosurgery;  Laterality: N/A;  Lumbar four and lumbar five laminectomies   . PACEMAKER INSERTION    . SPINE SURGERY        Allergies  Allergen Reactions  . Hydrocodone-Acetaminophen Other (See Comments)    Hallucinations at higher doses  . Lyrica [Pregabalin] Other (See Comments)    dizziness  . Tramadol Other (See Comments)    Hallucinations with higher doses  . Trazodone And Nefazodone Other (See Comments)    tachycardia    Current Outpatient Medications  Medication Sig Dispense Refill  . acetaminophen (TYLENOL) 325 MG tablet Take 2 tablets (650 mg total) by mouth every 6 (six) hours as  needed for mild pain (or Fever >/= 101).    Marland Kitchen amLODipine (NORVASC) 10 MG tablet TAKE 1 TABLET (10 MG TOTAL) BY MOUTH DAILY. (Patient taking differently: Take 10 mg by mouth every morning. ) 90 tablet 2  . aspirin 325 MG tablet Take 325 mg by mouth every evening.     Marland Kitchen atorvastatin (LIPITOR) 20 MG tablet TAKE 1 TABLET (20 MG TOTAL) BY MOUTH DAILY. (Patient taking differently: Take 20 mg by mouth daily at 6 PM. ) 90 tablet 1  . Calcium Carbonate-Vitamin D (CALCIUM + D) 600-200 MG-UNIT TABS Take 1 tablet by mouth 2 (two) times daily.      . cefdinir (OMNICEF) 300 MG capsule Take 1 capsule (300 mg total) by mouth  every 12 (twelve) hours for 28 doses. 28 capsule 0  . clopidogrel (PLAVIX) 75 MG tablet TAKE 1 TABLET (75 MG TOTAL) BY MOUTH DAILY. 90 tablet 1  . collagenase (SANTYL) ointment Apply to wound twice a day (Patient taking differently: Apply 1 application topically daily. Apply to wound twice a day) 30 g 1  . cyanocobalamin (,VITAMIN B-12,) 1000 MCG/ML injection INJECT 1 ML (1,000 MCG TOTAL) INTO THE MUSCLE EVERY 30 (THIRTY) DAYS. 3 mL 3  . fenofibrate (TRICOR) 48 MG tablet TAKE 1 TABLET BY MOUTH DAILY (Patient taking differently: Take 48 mg by mouth every morning. ) 90 tablet 1  . furosemide (LASIX) 20 MG tablet Take 2 tablets (40 mg total) by mouth daily. AS NEEDED FOR FLUID RETENTION (Patient taking differently: Take 20 mg by mouth 2 (two) times daily. ) 180 tablet 1  . glucose blood (ACCU-CHEK AVIVA PLUS) test strip CHECK BLOOD SUGAR 3 TIMES A DAY AS NEEDED DX E11.51 300 each 5  . HYDROcodone-acetaminophen (NORCO) 10-325 MG tablet Take 1 tablet by mouth every 6 (six) hours as needed. 30 tablet 0  . hydrocortisone 2.5 % cream Apply 1 application topically 2 (two) times daily as needed.    . insulin aspart protamine - aspart (NOVOLOG MIX 70/30 FLEXPEN) (70-30) 100 UNIT/ML FlexPen INJECT 18 units two times daily , adjust dose using sliding scale 75 pen 1  . Insulin Pen Needle (B-D ULTRAFINE III SHORT PEN) 31G X 8 MM MISC USE AS DIRECTED 3 TIMES A DAY 300 each 5  . Insulin Syringe-Needle U-100 (B-D INS SYRINGE 0.5CC/31GX5/16) 31G X 5/16" 0.5 ML MISC 1 application by Does not apply route 3 (three) times daily before meals. 300 each 3  . latanoprost (XALATAN) 0.005 % ophthalmic solution Place 1 drop into both eyes at bedtime.  3  . losartan (COZAAR) 100 MG tablet TAKE 1 TABLET BY MOUTH EVERY DAY (Patient taking differently: Take 100 mg by mouth every morning. ) 90 tablet 1  . metoprolol tartrate (LOPRESSOR) 100 MG tablet TAKE 1 TABLET BY MOUTH TWICE A DAY (Patient taking differently: Take 50 mg by mouth 2  (two) times daily. ) 180 tablet 1  . Multiple Vitamin (MULTIVITAMIN) tablet Take 1 tablet by mouth every evening.     . polyethylene glycol (MIRALAX / GLYCOLAX) packet Take 17 g by mouth daily as needed for mild constipation. 14 each 0  . protein supplement shake (PREMIER PROTEIN) LIQD Take 325 mLs (11 oz total) by mouth 2 (two) times daily between meals. 60 Can 0  . silver sulfADIAZINE (SILVADENE) 1 % cream Apply 1 application topically daily. Apply amputation site and small toe daily 50 g 0  . Syringe, Disposable, 3 ML MISC For use with B12 injections weekly/monthly 25  each 0  . SYRINGE-NEEDLE, DISP, 3 ML (B-D 3CC LUER-LOK SYR 25GX1") 25G X 1" 3 ML MISC FOR USE WITH B12 INJECTIONS WEEKLY/MONTHLY 50 each 1  . traMADol (ULTRAM) 50 MG tablet Take 1 tablet (50 mg total) by mouth 2 (two) times daily. 15 tablet 0   No current facility-administered medications for this visit.      Review of Systems: Negative Unless Checked Constitutional: [] Weight loss[] Fever[] Chills Cardiac:[] Chest pain[]  Atrial Fibrillation[] Palpitations [] Shortness of breath when laying flat [] Shortness of breath with exertion. Vascular: [] Pain in legs with walking[] Pain in legswith standing[] History of DVT [] Phlebitis [] Swelling in legs [] Varicose veins [x] Non-healing ulcers Pulmonary: [] Uses home oxygen [] Productive cough[] Hemoptysis [] Wheeze [] COPD [] Asthma Neurologic: [] Dizziness[] Seizures [] History of stroke [] History of TIA[] Aphasia [] Vissual changes[] Weaknessor numbness in arm [] Weakness or numbnessin leg Musculoskeletal:[] Joint swelling [] Joint pain [] Low back pain  []  History of Knee Replacement Hematologic:[] Easy bruising[] Easy bleeding [] Hypercoagulable state [] Anemic Gastrointestinal:[] Diarrhea [] Vomiting[] Gastroesophageal reflux/heartburn[] Difficulty swallowing. Genitourinary: [x] Chronic kidney disease [] Difficulturination  [] Anuric[] Blood in urine Skin: [] Rashes [] Ulcers  Psychological: [] History of anxiety[] History of major depression  []  Memory Difficulties     Physical Exam BP (!) 112/58 (BP Location: Right Arm, Patient Position: Sitting)   Pulse 60   Resp 17   Ht 5\' 8"  (1.727 m)   Wt 167 lb (75.8 kg)   BMI 25.39 kg/m  Gen:  WD/WN, NAD. Appears younger than stated age. Skin: Moderate fibrinous exudate at the toe amputation site.  Small area of ulceration on the forefoot above the amputation site.  The foot itself is warm with good capillary refill.     Assessment/Plan:  CKD (chronic kidney disease) stage 3, GFR 30-59 ml/min We have been very cognizant with each angiogram to try to limit the amount of contrast.  Renal function has been fairly stable.  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 Also, involved in wound healing. On appropriate medications.   Hypertension blood pressure control important in reducing the progression of atherosclerotic disease. On appropriate oral medications.   Atherosclerosis of native arteries of the extremities with ulceration ([place]) His ABIs remain reduced although he does have strong monophasic waveforms at the ankle.  His digit pressures are basically undetectable at this point.  This correlates with the findings on his recent angiograms of small vessel disease predominantly at this point. Symptomatically, he is much better.  I think we should try to come off his antibiotics and see how his foot does.  He has a scheduled follow-up appointment in December and he should keep that appointment.  I will be happy to see him back sooner if problems develop in the interim.      Leotis Pain 11/25/2017, 11:26 AM   This note was created with Dragon medical transcription system.  Any errors from dictation are unintentional.

## 2017-11-25 NOTE — Assessment & Plan Note (Signed)
We have been very cognizant with each angiogram to try to limit the amount of contrast.  Renal function has been fairly stable.

## 2017-11-26 ENCOUNTER — Ambulatory Visit: Payer: Medicare Other | Admitting: Nurse Practitioner

## 2017-11-27 ENCOUNTER — Ambulatory Visit: Payer: Medicare Other | Admitting: Nurse Practitioner

## 2017-11-27 ENCOUNTER — Encounter: Payer: Medicare Other | Admitting: Physician Assistant

## 2017-11-27 DIAGNOSIS — L988 Other specified disorders of the skin and subcutaneous tissue: Secondary | ICD-10-CM | POA: Diagnosis not present

## 2017-11-27 DIAGNOSIS — L97513 Non-pressure chronic ulcer of other part of right foot with necrosis of muscle: Secondary | ICD-10-CM | POA: Diagnosis not present

## 2017-11-27 DIAGNOSIS — I132 Hypertensive heart and chronic kidney disease with heart failure and with stage 5 chronic kidney disease, or end stage renal disease: Secondary | ICD-10-CM | POA: Diagnosis not present

## 2017-11-27 DIAGNOSIS — M79604 Pain in right leg: Secondary | ICD-10-CM | POA: Diagnosis not present

## 2017-11-27 DIAGNOSIS — E1122 Type 2 diabetes mellitus with diabetic chronic kidney disease: Secondary | ICD-10-CM | POA: Diagnosis not present

## 2017-11-27 DIAGNOSIS — E11621 Type 2 diabetes mellitus with foot ulcer: Secondary | ICD-10-CM | POA: Diagnosis not present

## 2017-11-27 DIAGNOSIS — I10 Essential (primary) hypertension: Secondary | ICD-10-CM | POA: Diagnosis not present

## 2017-11-27 DIAGNOSIS — I5022 Chronic systolic (congestive) heart failure: Secondary | ICD-10-CM | POA: Diagnosis not present

## 2017-11-27 DIAGNOSIS — L97512 Non-pressure chronic ulcer of other part of right foot with fat layer exposed: Secondary | ICD-10-CM | POA: Diagnosis not present

## 2017-11-27 DIAGNOSIS — Z794 Long term (current) use of insulin: Secondary | ICD-10-CM | POA: Diagnosis not present

## 2017-11-27 DIAGNOSIS — I70203 Unspecified atherosclerosis of native arteries of extremities, bilateral legs: Secondary | ICD-10-CM | POA: Diagnosis not present

## 2017-11-27 DIAGNOSIS — L97529 Non-pressure chronic ulcer of other part of left foot with unspecified severity: Secondary | ICD-10-CM | POA: Diagnosis not present

## 2017-11-27 DIAGNOSIS — L97522 Non-pressure chronic ulcer of other part of left foot with fat layer exposed: Secondary | ICD-10-CM | POA: Diagnosis not present

## 2017-11-27 DIAGNOSIS — L97526 Non-pressure chronic ulcer of other part of left foot with bone involvement without evidence of necrosis: Secondary | ICD-10-CM | POA: Diagnosis not present

## 2017-11-28 NOTE — Progress Notes (Signed)
[name] ([id number]) Visit Report for 11/21/2017 Arrival Information Details Patient Name: [name], [name]. Date of Service: 11/21/2017 1:30 PM Medical Record Number: [id number] Patient Account Number: [id number] Date of Birth/Sex: 02/06/1924 (84 y.o. M) Treating RN: Secundino Ginger Primary Care Bravery Ketcham: Deborra Medina Other Clinician: Referring Ronya Gilcrest: Deborra Medina Treating Amye Grego/Extender: Melburn Hake, HOYT Weeks in Treatment: 6 Visit Information History Since Last Visit Added or deleted any medications: No Patient Arrived: Walker Any new allergies or adverse reactions: No Arrival Time: 13:33 Had a fall or experienced change in No Accompanied By: son activities of daily living that may affect Transfer Assistance: None risk of falls: Patient Identification Verified: Yes Signs or symptoms of abuse/neglect since last visito No Secondary Verification Process Completed: Yes Implantable device outside of the clinic excluding No cellular tissue based products placed in the center since last visit: Has Dressing in Place as Prescribed: Yes Pain Present Now: No Electronic Signature(s) Signed: 11/21/2017 3:28:11 PM By: Secundino Ginger Entered By: Secundino Ginger on 11/21/2017 13:38:52 Scullin, [name] ([id number]) -------------------------------------------------------------------------------- Clinic Level of Care Assessment Details Patient Name: [name], [name] Date of Service: 11/21/2017 1:30 PM Medical Record Number: [id number] Patient Account Number: [id number] Date of Birth/Sex: Mar 08, 1924 (84 y.o. M) Treating RN: Montey Hora Primary Care Deontray Hunnicutt: Deborra Medina Other Clinician: Referring Lamont Glasscock: Deborra Medina Treating Araeya Lamb/Extender: Melburn Hake, HOYT Weeks in Treatment: 6 Clinic Level of Care Assessment Items TOOL 4 Quantity Score []  - Use when only an EandM is performed on FOLLOW-UP visit 0 ASSESSMENTS - Nursing Assessment / Reassessment X - Reassessment of  Co-morbidities (includes updates in patient status) 1 10 X- 1 5 Reassessment of Adherence to Treatment Plan ASSESSMENTS - Wound and Skin Assessment / Reassessment []  - Simple Wound Assessment / Reassessment - one wound 0 X- 2 5 Complex Wound Assessment / Reassessment - multiple wounds []  - 0 Dermatologic / Skin Assessment (not related to wound area) ASSESSMENTS - Focused Assessment X - Circumferential Edema Measurements - multi extremities 1 5 []  - 0 Nutritional Assessment / Counseling / Intervention X- 1 5 Lower Extremity Assessment (monofilament, tuning fork, pulses) []  - 0 Peripheral Arterial Disease Assessment (using hand held doppler) ASSESSMENTS - Ostomy and/or Continence Assessment and Care []  - Incontinence Assessment and Management 0 []  - 0 Ostomy Care Assessment and Management (repouching, etc.) PROCESS - Coordination of Care X - Simple Patient / Family Education for ongoing care 1 15 []  - 0 Complex (extensive) Patient / Family Education for ongoing care []  - 0 Staff obtains Programmer, systems, Records, Test Results / Process Orders []  - 0 Staff telephones HHA, Nursing Homes / Clarify orders / etc []  - 0 Routine Transfer to another Facility (non-emergent condition) []  - 0 Routine Hospital Admission (non-emergent condition) []  - 0 New Admissions / Biomedical engineer / Ordering NPWT, Apligraf, etc. []  - 0 Emergency Hospital Admission (emergent condition) X- 1 10 Simple Discharge Coordination Osoria, Bartley S. ([id number]) []  - 0 Complex (extensive) Discharge Coordination PROCESS - Special Needs []  - Pediatric / Minor Patient Management 0 []  - 0 Isolation Patient Management []  - 0 Hearing / Language / Visual special needs []  - 0 Assessment of Community assistance (transportation, D/C planning, etc.) []  - 0 Additional assistance / Altered mentation []  - 0 Support Surface(s) Assessment (bed, cushion, seat, etc.) INTERVENTIONS - Wound Cleansing / Measurement []   - Simple Wound Cleansing - one wound 0 X- 2 5 Complex Wound Cleansing - multiple wounds X- 1 5 Wound Imaging (photographs - any number of wounds) []  -  0 Wound Tracing (instead of photographs) []  - 0 Simple Wound Measurement - one wound X- 2 5 Complex Wound Measurement - multiple wounds INTERVENTIONS - Wound Dressings X - Small Wound Dressing one or multiple wounds 2 10 []  - 0 Medium Wound Dressing one or multiple wounds []  - 0 Large Wound Dressing one or multiple wounds []  - 0 Application of Medications - topical []  - 0 Application of Medications - injection INTERVENTIONS - Miscellaneous []  - External ear exam 0 []  - 0 Specimen Collection (cultures, biopsies, blood, body fluids, etc.) []  - 0 Specimen(s) / Culture(s) sent or taken to Lab for analysis []  - 0 Patient Transfer (multiple staff / Civil Service fast streamer / Similar devices) []  - 0 Simple Staple / Suture removal (25 or less) []  - 0 Complex Staple / Suture removal (26 or more) []  - 0 Hypo / Hyperglycemic Management (close monitor of Blood Glucose) []  - 0 Ankle / Brachial Index (ABI) - do not check if billed separately X- 1 5 Vital Signs Mukai, Maguire S. ([id number]) Has the patient been seen at the hospital within the last three years: Yes Total Score: 110 Level Of Care: New/Established - Level 3 Electronic Signature(s) Signed: 11/21/2017 5:23:22 PM By: Montey Hora Entered By: Montey Hora on 11/21/2017 14:59:55 Magno, [name] ([id number]) -------------------------------------------------------------------------------- Encounter Discharge Information Details Patient Name: [name], [name] Date of Service: 11/21/2017 1:30 PM Medical Record Number: [id number] Patient Account Number: [id number] Date of Birth/Sex: 08/28/1924 (84 y.o. M) Treating RN: Montey Hora Primary Care Leonette Tischer: Deborra Medina Other Clinician: Referring Mylo Choi: Deborra Medina Treating Jaimya Feliciano/Extender: Melburn Hake, HOYT Weeks in  Treatment: 6 Encounter Discharge Information Items Discharge Condition: Stable Ambulatory Status: Walker Discharge Destination: Home Health Telephoned: No Orders Sent: Yes Transportation: Private Auto Accompanied By: son Schedule Follow-up Appointment: Yes Clinical Summary of Care: Electronic Signature(s) Signed: 11/21/2017 4:29:26 PM By: Montey Hora Entered By: Montey Hora on 11/21/2017 16:29:26 Monforte, [name] ([id number]) -------------------------------------------------------------------------------- General Visit Notes Details Patient Name: Findlay, Duanne S. Date of Service: 11/21/2017 1:30 PM Medical Record Number: [id number] Patient Account Number: [id number] Date of Birth/Sex: [birth year] (84 y.o. M) Treating RN: Montey Hora Primary Care Maxton Noreen: Deborra Medina Other Clinician: Referring Marvel Sapp: Deborra Medina Treating Fletcher Ostermiller/Extender: Melburn Hake, HOYT Weeks in Treatment: 6 Notes During visit, patient reported that he had a "place" on his left fifth toe that is covered with the bandage over his left 3rd toe amp site. The bandage was removed to visualize this wound and also exposed the amp site wound. Amp site wound was not addressed during this visit as it is still under the care of the surgeon. I did replace the bandage over the amp site since it had to be removed in order to visualize the new wound on his left 5th toe. Electronic Signature(s) Signed: 11/21/2017 4:28:07 PM By: Montey Hora Entered By: Montey Hora on 11/21/2017 16:28:07 Girdler, [name] ([id number]) -------------------------------------------------------------------------------- Lower Extremity Assessment Details Patient Name: Hrdlicka, Kahleel S. Date of Service: 11/21/2017 1:30 PM Medical Record Number: [id number] Patient Account Number: [id number] Date of Birth/Sex: 1924-11-25 (84 y.o. M) Treating RN: Secundino Ginger Primary Care Jamilya Sarrazin: Deborra Medina Other Clinician: Referring Siri Buege:  Deborra Medina Treating Zyir Gassert/Extender: Melburn Hake, HOYT Weeks in Treatment: 6 Edema Assessment Assessed: [Left: No] [Right: No] Edema: [Left: N] [Right: o] Calf Left: Right: Point of Measurement: 30 cm From Medial Instep cm 31 cm Ankle Left: Right: Point of Measurement: 10 cm From Medial Instep cm 23 cm Vascular Assessment Claudication: Claudication Assessment [Left:Rest Pain] [Right:Rest  Pain] Pulses: Dorsalis Pedis Palpable: [Right:Yes] Posterior Tibial Extremity colors, hair growth, and conditions: Extremity Color: [Right:Normal] Temperature of Extremity: [Right:Warm] Capillary Refill: [Right:< 3 seconds] Notes missing some toenails. Electronic Signature(s) Signed: 11/21/2017 3:28:11 PM By: Secundino Ginger Entered By: Secundino Ginger on 11/21/2017 13:49:06 Timoney, [name] ([id number]) -------------------------------------------------------------------------------- Multi Wound Chart Details Patient Name: Aina, Masiyah S. Date of Service: 11/21/2017 1:30 PM Medical Record Number: [id number] Patient Account Number: [id number] Date of Birth/Sex: January 15, 1925 (84 y.o. 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 N/A N/A Comorbid History: Cataracts, Lymphedema, Cataracts, Lymphedema, N/A Coronary Artery Disease, Coronary Artery Disease, Hypertension, Peripheral Hypertension, Peripheral Venous Disease, Type II  Venous Disease, Type II Diabetes, End Stage Renal Diabetes, End Stage Renal Disease, Rheumatoid Arthritis, Disease, Rheumatoid Arthritis, Neuropathy Neuropathy Date Acquired: 06/04/2017 08/05/2017 N/A Weeks of Treatment: 6 0 N/A Wound Status: Open Open N/A Pending Amputation on Yes No N/A Presentation: Measurements L x W x D 0.8x0.9x0.3 1x0.6x0.1 N/A (cm) Area (cm) : 0.565 0.471 N/A Volume (cm) : 0.17 0.047 N/A % Reduction in Area: 52.00% 0.00% N/A % Reduction in Volume: 28.00% 0.00% N/A Classification: Grade 2 Full Thickness Without N/A Exposed Support Structures Exudate Amount: Small None Present N/A Exudate Type: Serous N/A N/A Exudate Color: amber N/A N/A Wound Margin: Distinct, outline attached N/A N/A Gasner, Cade S. ([id number]) Granulation Amount: Medium (34-66%) None Present (0%) N/A Granulation Quality: Red, Pink N/A N/A Necrotic Amount: Medium (34-66%) Small (1-33%) N/A Necrotic Tissue: Eschar, Adherent Slough Adherent Slough N/A Exposed Structures: Fat Layer (Subcutaneous Fascia: No N/A Tissue) Exposed: Yes Fat Layer (Subcutaneous Fascia: No Tissue) Exposed: No Tendon: No Tendon: No Muscle: No Muscle: No Joint: No Joint: No Bone: No Bone: No Epithelialization: None N/A N/A Periwound Skin Texture: Excoriation: No No Abnormalities Noted N/A Induration: No Callus: No Crepitus: No Rash: No Scarring: No Periwound Skin Moisture: Maceration: No No Abnormalities Noted N/A Dry/Scaly: No Periwound Skin Color: Atrophie Blanche: No No Abnormalities Noted N/A Cyanosis: No Ecchymosis: No Erythema: No Hemosiderin Staining: No Mottled: No Pallor: No Rubor: No Temperature: No Abnormality N/A N/A Tenderness on Palpation: No No N/A Wound Preparation: Ulcer Cleansing: Ulcer Cleansing: N/A Rinsed/Irrigated with Saline Rinsed/Irrigated with Saline Topical Anesthetic Applied: Topical Anesthetic Applied: Other: lidocaine 4% Other: lidocaine 4% Treatment  Notes Electronic Signature(s) Signed: 11/21/2017 5:23:22 PM By: Montey Hora Entered By: Montey Hora on 11/21/2017 14:25:36 Roston, [name] ([id number]) -------------------------------------------------------------------------------- [place] Details Patient Name: Kunkle, Reco S. Date of Service: 11/21/2017 1:30 PM Medical Record Number: [id number] Patient Account Number: [id number] Date of Birth/Sex: 11/02/1924 (84 y.o. M) Treating RN: Montey Hora Primary Care Ihan Pat: Deborra Medina Other Clinician: Referring Elky Funches: Deborra Medina Treating Bristol Osentoski/Extender: Melburn Hake, HOYT Weeks in Treatment: 6 Active Inactive ` Abuse / Safety / Falls / Self Care Management Nursing Diagnoses: Potential for falls Goals: Patient will remain injury free related to falls Date Initiated: 10/10/2017 Target Resolution Date: 01/02/2018 Goal Status: Active Interventions: Assess fall risk on admission and as needed Notes: ` Orientation to the Wound Care Program Nursing Diagnoses: Knowledge deficit related to the  wound healing center program Goals: Patient/caregiver will verbalize understanding of the [facility] Date Initiated: 10/10/2017 Target Resolution Date: 01/02/2018 Goal Status: Active Interventions: Provide education on orientation to the wound center Notes: ` Pain, Acute or Chronic Nursing Diagnoses: Potential alteration in comfort, pain Goals: Patient/caregiver will verbalize adequate pain control between visits Date Initiated: 10/10/2017 Target Resolution Date: 01/02/2018 Goal Status: Active Interventions: Trainer, JAMON HAYHURST ([id number]) Complete pain assessment as per visit requirements Notes: ` Wound/Skin Impairment Nursing Diagnoses: Impaired tissue integrity Goals: Ulcer/skin breakdown will heal within 14 weeks Date Initiated: 10/10/2017 Target Resolution Date: 01/02/2018 Goal Status: Active Interventions: Assess  patient/caregiver ability to obtain necessary supplies Assess patient/caregiver ability to perform ulcer/skin care regimen upon admission and as needed Assess ulceration(s) every visit Notes: Electronic Signature(s) Signed: 11/21/2017 5:23:22 PM By: Montey Hora Entered By: Montey Hora on 11/21/2017 14:25:29 Cuadra, [name] ([id number]) -------------------------------------------------------------------------------- Pain Assessment Details Patient Name: Brosnahan, Catalino S. Date of Service: 11/21/2017 1:30 PM Medical Record Number: [id number] Patient Account Number: [id number] Date of Birth/Sex: 1924-03-25 (84 y.o. M) Treating RN: Secundino Ginger Primary Care Louana Fontenot: Deborra Medina Other Clinician: Referring Marirose Deveney: Deborra Medina Treating Jesyka Slaght/Extender: Melburn Hake, HOYT Weeks in Treatment: 6 Active Problems Location of Pain Severity and Description of Pain Patient Has Paino No Site Locations Pain Management and Medication Current Pain Management: Goals for Pain Management pt denies any pain at this time. Electronic Signature(s) Signed: 11/21/2017 3:28:11 PM By: Secundino Ginger Entered By: Secundino Ginger on 11/21/2017 13:39:19 Rezendes, [name] ([id number]) -------------------------------------------------------------------------------- Patient/Caregiver Education Details Patient Name: Bertagnolli, [name]. Date of Service: 11/21/2017 1:30 PM Medical Record Number: [id number] Patient Account Number: [id number] Date of Birth/Gender: 04/28/1924 (84 y.o. M) Treating RN: Montey Hora Primary Care Physician: Deborra Medina Other Clinician: Referring Physician: Deborra Medina Treating Physician/Extender: Sharalyn Ink in Treatment: 6 Education Assessment Education Provided To: Patient and Caregiver Education Topics Provided Wound/Skin Impairment: Handouts: Other: wound care s ordered Methods: Demonstration, Explain/Verbal Responses: State content correctly Electronic  Signature(s) Signed: 11/21/2017 5:23:22 PM By: Montey Hora Entered By: Montey Hora on 11/21/2017 16:29:41 Linares, [name] ([id number]) -------------------------------------------------------------------------------- Wound Assessment Details Patient Name: Alleyne, Jehad S. Date of Service: 11/21/2017 1:30 PM Medical Record Number: [id number] Patient Account Number: [id number] Date of Birth/Sex: 03/15/1924 (84 y.o. M) Treating RN: Secundino Ginger Primary Care Kyriakos Babler: Deborra Medina Other Clinician: Referring Lliam Hoh: Deborra Medina Treating Christiane Sistare/Extender: Melburn Hake, HOYT Weeks in Treatment: 6 Wound Status Wound Number: 1 Primary Diabetic Wound/Ulcer of the Lower Extremity Etiology: Wound Location: Right Toe Second Secondary Arterial Insufficiency Ulcer Wounding Event: Gradually Appeared Etiology: Date Acquired: 06/04/2017 Wound Open Weeks Of Treatment: 6 Status: Clustered Wound: No Comorbid Cataracts, Lymphedema, Coronary Artery Pending Amputation On Presentation History: Disease, Hypertension, Peripheral Venous Disease, Type II Diabetes, End Stage Renal Disease, Rheumatoid Arthritis, Neuropathy Photos Photo Uploaded By: Secundino Ginger on 11/21/2017 14:04:53 Wound Measurements Length: (cm) 0.8 % Reduction Width: (cm) 0.9 % Reduction Depth: (cm) 0.3 Epitheliali Area: (cm) 0.565 Tunneling: Volume: (cm) 0.17 Underminin in Area: 52% in Volume: 28% zation: None No g: No Wound Description Classification: Grade 2 Foul Odor A Wound Margin: Distinct, outline attached Slough/Fibr Exudate Amount: Small Exudate Type: Serous Exudate Color: amber fter Cleansing: No ino Yes Wound Bed Granulation Amount: Medium (34-66%) Exposed Structure Granulation Quality: Red, Pink Fascia Exposed: No Necrotic Amount: Medium (34-66%) Fat Layer (Subcutaneous Tissue) Exposed: Yes Necrotic Quality: Eschar, Adherent Slough Tendon Exposed: No Muscle Exposed: No Joint Exposed: No Poythress,  Onix S. ([id number]) Bone Exposed: No Periwound  Skin Texture Texture Color No Abnormalities Noted: No No Abnormalities Noted: No Callus: No Atrophie Blanche: No Crepitus: No Cyanosis: No Excoriation: No Ecchymosis: No Induration: No Erythema: No Rash: No Hemosiderin Staining: No Scarring: No Mottled: No Pallor: No Moisture Rubor: No No Abnormalities Noted: No Dry / Scaly: No Temperature / Pain Maceration: No Temperature: No Abnormality Wound Preparation Ulcer Cleansing: Rinsed/Irrigated with Saline Topical Anesthetic Applied: Other: lidocaine 4%, Electronic Signature(s) Signed: 11/21/2017 3:28:11 PM By: Secundino Ginger Entered By: Secundino Ginger on 11/21/2017 13:46:17 Worthy, [name] ([id number]) -------------------------------------------------------------------------------- Wound Assessment Details Patient Name: Breunig, Demarian S. Date of Service: 11/21/2017 1:30 PM Medical Record Number: [id number] Patient Account Number: [id number] Date of Birth/Sex: 26-Sep-1924 (84 y.o. M) Treating RN: Secundino Ginger Primary Care Captain Blucher: Deborra Medina Other Clinician: Referring Jerilynn Feldmeier: Deborra Medina Treating Danyel Tobey/Extender: Melburn Hake, HOYT Weeks in Treatment: 6 Wound Status Wound Number: 3 Primary To be determined Etiology: Wound Location: Left Toe Fifth - Lateral Wound Open Wounding Event: Not Known Status: Date Acquired: 08/05/2017 Comorbid Cataracts, Lymphedema, Coronary Artery Weeks Of Treatment: 0 History: Disease, Hypertension, Peripheral Venous Clustered Wound: No Disease, Type II Diabetes, End Stage Renal Disease, Rheumatoid Arthritis, Neuropathy Photos Photo Uploaded By: Secundino Ginger on 11/21/2017 14:04:53 Wound Measurements Length: (cm) 1 Width: (cm) 0.6 Depth: (cm) 0.1 Area: (cm) 0.471 Volume: (cm) 0.047 % Reduction in Area: 0% % Reduction in Volume: 0% Tunneling: No Undermining: No Wound Description Full Thickness Without Exposed Support  Foul Classification: Structures Sloug Exudate None Present Amount: Odor After Cleansing: No h/Fibrino No Wound Bed Granulation Amount: None Present (0%) Exposed Structure Necrotic Amount: Small (1-33%) Fascia Exposed: No Necrotic Quality: Adherent Slough Fat Layer (Subcutaneous Tissue) Exposed: No Tendon Exposed: No Muscle Exposed: No Joint Exposed: No Bone Exposed: No Periwound Skin Texture Karney, Kevonta S. ([id number]) Texture Color No Abnormalities Noted: Yes No Abnormalities Noted: Yes Moisture No Abnormalities Noted: No Wound Preparation Ulcer Cleansing: Rinsed/Irrigated with Saline Topical Anesthetic Applied: Other: lidocaine 4%, Electronic Signature(s) Signed: 11/21/2017 3:28:11 PM By: Secundino Ginger Entered By: Secundino Ginger on 11/21/2017 14:01:55 Kroon, [name] ([id number]) -------------------------------------------------------------------------------- [place] Details Patient Name: Emry, Prescott S. Date of Service: 11/21/2017 1:30 PM Medical Record Number: [id number] Patient Account Number: [id number] Date of Birth/Sex: 1925/01/23 (84 y.o. M) Treating RN: Secundino Ginger Primary Care Haydan Mansouri: Deborra Medina Other Clinician: Referring Matisyn Cabeza: Deborra Medina Treating Kealey Kemmer/Extender: Melburn Hake, HOYT Weeks in Treatment: 6 Vital Signs Time Taken: 13:41 Temperature (F): 98.5 Height (in): 68 Pulse (bpm): 77 Weight (lbs): 180 Respiratory Rate (breaths/min): 18 Body Mass Index (BMI): 27.4 Blood Pressure (mmHg): 114/41 Reference Range: 80 - 120 mg / dl Electronic Signature(s) Signed: 11/21/2017 3:28:11 PM By: Secundino Ginger Entered By: Secundino Ginger on 11/21/2017 13:41:09

## 2017-11-29 DIAGNOSIS — I10 Essential (primary) hypertension: Secondary | ICD-10-CM | POA: Diagnosis not present

## 2017-11-29 DIAGNOSIS — E11621 Type 2 diabetes mellitus with foot ulcer: Secondary | ICD-10-CM | POA: Diagnosis not present

## 2017-11-29 DIAGNOSIS — M79604 Pain in right leg: Secondary | ICD-10-CM | POA: Diagnosis not present

## 2017-11-29 DIAGNOSIS — I70203 Unspecified atherosclerosis of native arteries of extremities, bilateral legs: Secondary | ICD-10-CM | POA: Diagnosis not present

## 2017-11-29 DIAGNOSIS — Z794 Long term (current) use of insulin: Secondary | ICD-10-CM | POA: Diagnosis not present

## 2017-11-29 DIAGNOSIS — L97513 Non-pressure chronic ulcer of other part of right foot with necrosis of muscle: Secondary | ICD-10-CM | POA: Diagnosis not present

## 2017-11-30 ENCOUNTER — Encounter (INDEPENDENT_AMBULATORY_CARE_PROVIDER_SITE_OTHER): Payer: Self-pay | Admitting: Nurse Practitioner

## 2017-12-01 ENCOUNTER — Encounter (INDEPENDENT_AMBULATORY_CARE_PROVIDER_SITE_OTHER): Payer: Self-pay | Admitting: Nurse Practitioner

## 2017-12-01 ENCOUNTER — Other Ambulatory Visit (INDEPENDENT_AMBULATORY_CARE_PROVIDER_SITE_OTHER): Payer: Self-pay | Admitting: Nurse Practitioner

## 2017-12-01 DIAGNOSIS — I70203 Unspecified atherosclerosis of native arteries of extremities, bilateral legs: Secondary | ICD-10-CM | POA: Diagnosis not present

## 2017-12-01 DIAGNOSIS — I10 Essential (primary) hypertension: Secondary | ICD-10-CM | POA: Diagnosis not present

## 2017-12-01 DIAGNOSIS — E11621 Type 2 diabetes mellitus with foot ulcer: Secondary | ICD-10-CM | POA: Diagnosis not present

## 2017-12-01 DIAGNOSIS — L97513 Non-pressure chronic ulcer of other part of right foot with necrosis of muscle: Secondary | ICD-10-CM | POA: Diagnosis not present

## 2017-12-01 DIAGNOSIS — Z794 Long term (current) use of insulin: Secondary | ICD-10-CM | POA: Diagnosis not present

## 2017-12-01 DIAGNOSIS — M79604 Pain in right leg: Secondary | ICD-10-CM | POA: Diagnosis not present

## 2017-12-01 MED ORDER — VORAPAXAR SULFATE 2.08 MG PO TABS: 2.0800 mg | ORAL_TABLET | Freq: Every day | ORAL | 2 refills | Status: DC

## 2017-12-01 NOTE — Telephone Encounter (Signed)
Can we send orders for [facility] to apply santyl to amputation and dorsal wound site, once daily.  Thanks

## 2017-12-02 ENCOUNTER — Encounter (INDEPENDENT_AMBULATORY_CARE_PROVIDER_SITE_OTHER): Payer: Self-pay | Admitting: Nurse Practitioner

## 2017-12-02 NOTE — Telephone Encounter (Signed)
[name]'s daughter here again, with some questions about Zontivity: 1) Does "bleeding" mean spontaneous bleeding or problematical bleeding from trauma or surgery? 2) If Dad is taking it, what happens if he needs an urgent surgical procedure? Does he have to wait until the Zontivity is out of his system, or is he given an "antidote" that prevents uncontrolled bleeding? 3) Did [facility] prescribe the 325mg  aspirin?    [facility] had to order the Zont[facility] so it will not be ready until Thursday, which gives [place] time to be sure it's right for him. Thanks again for your help!   Please Advise.

## 2017-12-02 NOTE — Progress Notes (Signed)
JAZMINE, LONGSHORE ([id number]) Visit Report for 11/27/2017 Arrival Information Details Patient Name: Lame, TYQUEZ HOLLIBAUGH. Date of Service: 11/27/2017 3:00 PM Medical Record Number: [id number] Patient Account Number: [id number] Date of Birth/Sex: [birth year] (84 y.o. M) Treating RN: Cornell Barman Primary Care Dhiya Smits: Deborra Medina Other Clinician: Referring Tae Vonada: Deborra Medina Treating Geralda Baumgardner/Extender: Melburn Hake, HOYT Weeks in Treatment: 6 Visit Information History Since Last Visit Added or deleted any medications: Yes Patient Arrived: Ambulatory Any new allergies or adverse reactions: No Arrival Time: 15:05 Had a fall or experienced change in No Accompanied By: daughter activities of daily living that may affect Transfer Assistance: None risk of falls: Patient Identification Verified: Yes Signs or symptoms of abuse/neglect since last visito No Secondary Verification Process Completed: Yes Hospitalized since last visit: No Implantable device outside of the clinic excluding No cellular tissue based products placed in the center since last visit: Has Dressing in Place as Prescribed: Yes Pain Present Now: Yes Electronic Signature(s) Signed: 11/27/2017 4:35:15 PM By: Lorine Bears RCP, RRT, CHT Entered By: Becky Sax, Amado Nash on 11/27/2017 15:08:03 Study, [name] ([id number]) -------------------------------------------------------------------------------- Clinic Level of Care Assessment Details Patient Name: Fecher, JOEVANNI RODDEY. Date of Service: 11/27/2017 3:00 PM Medical Record Number: [id number] Patient Account Number: [id number] Date of Birth/Sex: 1924-07-31 (84 y.o. M) Treating RN: Cornell Barman Primary Care Kathreen Dileo: Deborra Medina Other Clinician: Referring Arsh Feutz: Deborra Medina Treating Eh Sauseda/Extender: Melburn Hake, HOYT Weeks in Treatment: 6 Clinic Level of Care Assessment Items TOOL 4 Quantity Score []  - Use when only an EandM is performed on  FOLLOW-UP visit 0 ASSESSMENTS - Nursing Assessment / Reassessment []  - Reassessment of Co-morbidities (includes updates in patient status) 0 []  - 0 Reassessment of Adherence to Treatment Plan ASSESSMENTS - Wound and Skin Assessment / Reassessment []  - Simple Wound Assessment / Reassessment - one wound 0 X- 3 5 Complex Wound Assessment / Reassessment - multiple wounds []  - 0 Dermatologic / Skin Assessment (not related to wound area) ASSESSMENTS - Focused Assessment []  - Circumferential Edema Measurements - multi extremities 0 []  - 0 Nutritional Assessment / Counseling / Intervention []  - 0 Lower Extremity Assessment (monofilament, tuning fork, pulses) []  - 0 Peripheral Arterial Disease Assessment (using hand held doppler) ASSESSMENTS - Ostomy and/or Continence Assessment and Care []  - Incontinence Assessment and Management 0 []  - 0 Ostomy Care Assessment and Management (repouching, etc.) PROCESS - Coordination of Care X - Simple Patient / Family Education for ongoing care 1 15 []  - 0 Complex (extensive) Patient / Family Education for ongoing care X- 1 10 Staff obtains Programmer, systems, Records, Test Results / Process Orders []  - 0 Staff telephones HHA, Nursing Homes / Clarify orders / etc []  - 0 Routine Transfer to another Facility (non-emergent condition) []  - 0 Routine Hospital Admission (non-emergent condition) []  - 0 New Admissions / Biomedical engineer / Ordering NPWT, Apligraf, etc. []  - 0 Emergency Hospital Admission (emergent condition) X- 1 10 Simple Discharge Coordination Furney, Cal S. ([id number]) []  - 0 Complex (extensive) Discharge Coordination PROCESS - Special Needs []  - Pediatric / Minor Patient Management 0 []  - 0 Isolation Patient Management []  - 0 Hearing / Language / Visual special needs []  - 0 Assessment of Community assistance (transportation, D/C planning, etc.) []  - 0 Additional assistance / Altered mentation []  - 0 Support Surface(s)  Assessment (bed, cushion, seat, etc.) INTERVENTIONS - Wound Cleansing / Measurement []  - Simple Wound Cleansing - one wound 0 X- 3 5 Complex Wound Cleansing - multiple wounds X- 1 5  Wound Imaging (photographs - any number of wounds) []  - 0 Wound Tracing (instead of photographs) []  - 0 Simple Wound Measurement - one wound X- 3 5 Complex Wound Measurement - multiple wounds INTERVENTIONS - Wound Dressings X - Small Wound Dressing one or multiple wounds 3 10 []  - 0 Medium Wound Dressing one or multiple wounds []  - 0 Large Wound Dressing one or multiple wounds []  - 0 Application of Medications - topical []  - 0 Application of Medications - injection INTERVENTIONS - Miscellaneous []  - External ear exam 0 []  - 0 Specimen Collection (cultures, biopsies, blood, body fluids, etc.) []  - 0 Specimen(s) / Culture(s) sent or taken to Lab for analysis []  - 0 Patient Transfer (multiple staff / Civil Service fast streamer / Similar devices) []  - 0 Simple Staple / Suture removal (25 or less) []  - 0 Complex Staple / Suture removal (26 or more) []  - 0 Hypo / Hyperglycemic Management (close monitor of Blood Glucose) []  - 0 Ankle / Brachial Index (ABI) - do not check if billed separately X- 1 5 Vital Signs Apel, Steen S. ([id number]) Has the patient been seen at the hospital within the last three years: Yes Total Score: 120 Level Of Care: New/Established - Level 4 Electronic Signature(s) Signed: 11/27/2017 6:00:52 PM By: Gretta Cool, BSN, RN, CWS, Kim RN, BSN Entered By: Gretta Cool, BSN, RN, CWS, Kim on 11/27/2017 17:56:05 Chachere, [name] ([id number]) -------------------------------------------------------------------------------- Encounter Discharge Information Details Patient Name: Clermont, Trevone S. Date of Service: 11/27/2017 3:00 PM Medical Record Number: [id number] Patient Account Number: [id number] Date of Birth/Sex: [birth year] (84 y.o. M) Treating RN: Cornell Barman Primary Care Lavine Hargrove: Deborra Medina  Other Clinician: Referring Keshanna Riso: Deborra Medina Treating Wasif Simonich/Extender: Melburn Hake, HOYT Weeks in Treatment: 6 Encounter Discharge Information Items Discharge Condition: Stable Ambulatory Status: Ambulatory Discharge Destination: Home Transportation: Other Accompanied By: daughter Schedule Follow-up Appointment: Yes Clinical Summary of Care: Electronic Signature(s) Signed: 11/27/2017 5:57:45 PM By: Gretta Cool, BSN, RN, CWS, Kim RN, BSN Entered By: Gretta Cool, BSN, RN, CWS, Kim on 11/27/2017 17:57:45 Lindler, [name] ([id number]) -------------------------------------------------------------------------------- Lower Extremity Assessment Details Patient Name: Barthold, Hamdi S. Date of Service: 11/27/2017 3:00 PM Medical Record Number: [id number] Patient Account Number: [id number] Date of Birth/Sex: 1924-09-21 (84 y.o. M) Treating RN: Secundino Ginger Primary Care Tanisia Yokley: Deborra Medina Other Clinician: Referring Xan Sparkman: Deborra Medina Treating Brandun Pinn/Extender: Melburn Hake, HOYT Weeks in Treatment: 6 Edema Assessment Assessed: Shirlyn Goltz: No] [Right: No] [Left: Edema] [Right: :] Calf Left: Right: Point of Measurement: 30 cm From Medial Instep cm cm Ankle Left: Right: Point of Measurement: 10 cm From Medial Instep cm cm Electronic Signature(s) Signed: 12/01/2017 3:39:38 PM By: Secundino Ginger Entered By: Secundino Ginger on 11/27/2017 15:14:12 Marchitto, [name] ([id number]) -------------------------------------------------------------------------------- Multi Wound Chart Details Patient Name: Waguespack, Woodrow S. Date of Service: 11/27/2017 3:00 PM Medical Record Number: [id number] Patient Account Number: [id number] Date of Birth/Sex: 11-23-1924 (84 y.o. M) Treating RN: Secundino Ginger Primary Care Mahmud Keithly: Deborra Medina Other Clinician: Referring Derrian Poli: Deborra Medina Treating Maxx Calaway/Extender: Melburn Hake, HOYT Weeks in Treatment: 6 Vital Signs Height(in): 68 Pulse(bpm): 53 Weight(lbs): 180 Blood  Pressure(mmHg): 129/43 Body Mass Index(BMI): 27 Temperature(F): 98.2 Respiratory Rate 18 (breaths/min): Photos: [1:No Photos] [3:No Photos] [4:No Photos] Wound Location: [1:Right Toe Second] [3:Left Toe Fifth - Lateral] [4:Left Foot - Dorsal, Medial] Wounding Event: [1:Gradually Appeared] [3:Not Known] [4:Trauma] Primary Etiology: [1:Diabetic Wound/Ulcer of the Lower Extremity] [3:To be determined] [4:Trauma, Other] Secondary Etiology: [1:Arterial Insufficiency Ulcer] [3:N/A] [4:N/A] Comorbid History: [1:Cataracts, Lymphedema, Coronary Artery Disease, Hypertension, Peripheral Venous Disease,  Type II Diabetes, End Stage Renal Disease, Rheumatoid Arthritis, Neuropathy] [3:Cataracts, Lymphedema, Coronary Artery Disease, Hypertension,  Peripheral Venous Disease, Type II Diabetes, End Stage Renal Disease, Rheumatoid Arthritis, Neuropathy] [4:Cataracts, Lymphedema, Coronary Artery Disease, Hypertension, Peripheral Venous Disease, Type II Diabetes, End Stage Renal Disease, Rheumatoid  Arthritis, Neuropathy] Date Acquired: [1:06/04/2017] [3:08/05/2017] [4:11/20/2017] Weeks of Treatment: [1:6] [3:0] [4:0] Wound Status: [1:Open] [3:Open] [4:Open] Pending Amputation on [1:Yes] [3:No] [4:No] Presentation: Measurements L x W x D [1:0.8x0.8x0.3] [3:0.7x0.6x0.1] [4:0.7x1x0.2] (cm) Area (cm) : [1:0.503] [3:0.33] [4:0.55] Volume (cm) : [1:0.151] [3:0.033] [4:0.11] % Reduction in Area: [1:57.30%] [3:29.90%] [4:N/A] % Reduction in Volume: [1:36.00%] [3:29.80%] [4:N/A] Classification: [1:Grade 2] [3:Full Thickness Without Exposed Support Structures] [4:Full Thickness Without Exposed Support Structures] Exudate Amount: [1:Small] [3:None Present] [4:Medium] Exudate Type: [1:Serous] [3:N/A] [4:Serous] Exudate Color: [1:amber] [3:N/A] [4:amber] Wound Margin: [1:Distinct, outline attached] [3:N/A] [4:Flat and Intact] Granulation Amount: [1:None Present (0%)] [3:None Present (0%)] [4:None Present  (0%)] Necrotic Amount: [1:None Present (0%)] [3:None Present (0%)] [4:Large (67-100%)] Necrotic Tissue: [1:N/A] [3:N/A] [4:Eschar, Adherent Slough] Exposed Structures: [1:Fat Layer (Subcutaneous Tissue) Exposed: Yes Fascia: No Tendon: No] [3:Fascia: No Fat Layer (Subcutaneous Tissue) Exposed: No Tendon: No] [4:Fat Layer (Subcutaneous Tissue) Exposed: Yes Fascia: No Tendon: No] Muscle: No Muscle: No Muscle: No Joint: No Joint: No Joint: No Bone: No Bone: No Bone: No Epithelialization: None N/A None Periwound Skin Texture: Excoriation: No No Abnormalities Noted Scarring: Yes Induration: No Callus: No Crepitus: No Rash: No Scarring: No Periwound Skin Moisture: Maceration: No Maceration: No No Abnormalities Noted Dry/Scaly: No Dry/Scaly: No Periwound Skin Color: Atrophie Blanche: No No Abnormalities Noted No Abnormalities Noted Cyanosis: No Ecchymosis: No Erythema: No Hemosiderin Staining: No Mottled: No Pallor: No Rubor: No Temperature: No Abnormality N/A No Abnormality Tenderness on Palpation: Yes Yes No Wound Preparation: Ulcer Cleansing: Ulcer Cleansing: Ulcer Cleansing: Rinsed/Irrigated with Saline Rinsed/Irrigated with Saline Rinsed/Irrigated with Saline Topical Anesthetic Applied: Topical Anesthetic Applied: Topical Anesthetic Applied: Other: lidocaine 4% Other: lidocaine 4% None Treatment Notes Electronic Signature(s) Signed: 12/01/2017 3:39:38 PM By: Secundino Ginger Entered By: Secundino Ginger on 11/27/2017 15:30:58 Orris, [name] ([id number]) -------------------------------------------------------------------------------- [place] Details Patient Name: Radin, Timtohy S. Date of Service: 11/27/2017 3:00 PM Medical Record Number: [id number] Patient Account Number: [id number] Date of Birth/Sex: 05/10/1924 (84 y.o. M) Treating RN: Secundino Ginger Primary Care Kunal Levario: Deborra Medina Other Clinician: Referring Shaunie Boehm: Deborra Medina Treating  Jarry Manon/Extender: Melburn Hake, HOYT Weeks in Treatment: 6 Active Inactive ` Abuse / Safety / Falls / Self Care Management Nursing Diagnoses: Potential for falls Goals: Patient will remain injury free related to falls Date Initiated: 10/10/2017 Target Resolution Date: 01/02/2018 Goal Status: Active Interventions: Assess fall risk on admission and as needed Notes: ` Orientation to the Wound Care Program Nursing Diagnoses: Knowledge deficit related to the wound healing center program Goals: Patient/caregiver will verbalize understanding of the [place] Date Initiated: 10/10/2017 Target Resolution Date: 01/02/2018 Goal Status: Active Interventions: Provide education on orientation to the wound center Notes: ` Pain, Acute or Chronic Nursing Diagnoses: Potential alteration in comfort, pain Goals: Patient/caregiver will verbalize adequate pain control between visits Date Initiated: 10/10/2017 Target Resolution Date: 01/02/2018 Goal Status: Active Interventions: Stracener, SLAYTER [employer]ORHOUSE ([id number]) Complete pain assessment as per visit requirements Notes: ` Wound/Skin Impairment Nursing Diagnoses: Impaired tissue integrity Goals: Ulcer/skin breakdown will heal within 14 weeks Date Initiated: 10/10/2017 Target Resolution Date: 01/02/2018 Goal Status: Active Interventions: Assess patient/caregiver ability to obtain necessary supplies Assess patient/caregiver ability to perform ulcer/skin care regimen upon admission and  as needed Assess ulceration(s) every visit Notes: Electronic Signature(s) Signed: 12/01/2017 3:39:38 PM By: Secundino Ginger Entered By: Secundino Ginger on 11/27/2017 15:30:50 Gregori, [name] ([id number]) -------------------------------------------------------------------------------- Pain Assessment Details Patient Name: Petrik, Andres S. Date of Service: 11/27/2017 3:00 PM Medical Record Number: [id number] Patient Account Number: [id number] Date of  Birth/Sex: 15-Jan-1925 (84 y.o. M) Treating RN: Cornell Barman Primary Care Guthrie Lemme: Deborra Medina Other Clinician: Referring Junnie Loschiavo: Deborra Medina Treating Bryse Blanchette/Extender: Melburn Hake, HOYT Weeks in Treatment: 6 Active Problems Location of Pain Severity and Description of Pain Patient Has Paino Yes Site Locations Rate the pain. Current Pain Level: 1 Pain Management and Medication Current Pain Management: Electronic Signature(s) Signed: 11/27/2017 4:35:15 PM By: Lorine Bears RCP, RRT, CHT Signed: 11/27/2017 6:00:52 PM By: Gretta Cool, BSN, RN, CWS, Kim RN, BSN Entered By: Lorine Bears on 11/27/2017 15:07:03 Viar, [name] ([id number]) -------------------------------------------------------------------------------- Patient/Caregiver Education Details Patient Name: Conran, [name]. Date of Service: 11/27/2017 3:00 PM Medical Record Number: [id number] Patient Account Number: [id number] Date of Birth/Gender: 17-Jul-1924 (84 y.o. M) Treating RN: Cornell Barman Primary Care Physician: Deborra Medina Other Clinician: Referring Physician: Deborra Medina Treating Physician/Extender: Sharalyn Ink in Treatment: 6 Education Assessment Education Provided To: Patient and Caregiver Education Topics Provided Wound/Skin Impairment: Handouts: Caring for Your Ulcer Methods: Demonstration, Explain/Verbal Responses: State content correctly Electronic Signature(s) Signed: 11/27/2017 6:00:52 PM By: Gretta Cool, BSN, RN, CWS, Kim RN, BSN Entered By: Gretta Cool, BSN, RN, CWS, Kim on 11/27/2017 17:56:25 Villasenor, [name] ([id number]) -------------------------------------------------------------------------------- Wound Assessment Details Patient Name: Tuckey, Davionte S. Date of Service: 11/27/2017 3:00 PM Medical Record Number: [id number] Patient Account Number: [id number] Date of Birth/Sex: November 01, 1924 (84 y.o. M) Treating RN: Secundino Ginger Primary Care Scott Fix: Deborra Medina  Other Clinician: Referring Maryiah Olvey: Deborra Medina Treating Petros Ahart/Extender: Melburn Hake, HOYT Weeks in Treatment: 6 Wound Status Wound Number: 1 Primary Diabetic Wound/Ulcer of the Lower Extremity Etiology: Wound Location: Right Toe Second Secondary Arterial Insufficiency Ulcer Wounding Event: Gradually Appeared Etiology: Date Acquired: 06/04/2017 Wound Open Weeks Of Treatment: 6 Status: Clustered Wound: No Comorbid Cataracts, Lymphedema, Coronary Artery Pending Amputation On Presentation History: Disease, Hypertension, Peripheral Venous Disease, Type II Diabetes, End Stage Renal Disease, Rheumatoid Arthritis, Neuropathy Photos Photo Uploaded By: Secundino Ginger on 11/27/2017 15:57:01 Wound Measurements Length: (cm) 0.8 Width: (cm) 0.8 Depth: (cm) 0.3 Area: (cm) 0.503 Volume: (cm) 0.151 % Reduction in Area: 57.3% % Reduction in Volume: 36% Epithelialization: None Tunneling: No Undermining: No Wound Description Classification: Grade 2 Foul Odor A Wound Margin: Distinct, outline attached Slough/Fibr Exudate Amount: Small Exudate Type: Serous Exudate Color: amber fter Cleansing: No ino No Wound Bed Granulation Amount: None Present (0%) Exposed Structure Necrotic Amount: None Present (0%) Fascia Exposed: No Fat Layer (Subcutaneous Tissue) Exposed: Yes Tendon Exposed: No Muscle Exposed: No Joint Exposed: No Bearman, Llewellyn S. ([id number]) Bone Exposed: No Periwound Skin Texture Texture Color No Abnormalities Noted: No No Abnormalities Noted: No Callus: No Atrophie Blanche: No Crepitus: No Cyanosis: No Excoriation: No Ecchymosis: No Induration: No Erythema: No Rash: No Hemosiderin Staining: No Scarring: No Mottled: No Pallor: No Moisture Rubor: No No Abnormalities Noted: No Dry / Scaly: No Temperature / Pain Maceration: No Temperature: No Abnormality Tenderness on Palpation: Yes Wound Preparation Ulcer Cleansing: Rinsed/Irrigated with Saline Topical  Anesthetic Applied: Other: lidocaine 4%, Treatment Notes Wound #1 (Right Toe Second) Notes Santyl on left dorsal foot; Prisma on others secured with coverlet Electronic Signature(s) Signed: 12/01/2017 3:39:38 PM By: Secundino Ginger Entered By: Secundino Ginger on 11/27/2017 15:18:26 Kray, Herbie [place]  Chauncey Cruel ([id number]) -------------------------------------------------------------------------------- Wound Assessment Details Patient Name: Colla, DAQUAWN SEELMAN. Date of Service: 11/27/2017 3:00 PM Medical Record Number: [id number] Patient Account Number: [id number] Date of Birth/Sex: 08/01/1924 (84 y.o. M) Treating RN: Secundino Ginger Primary Care Marliss Buttacavoli: Deborra Medina Other Clinician: Referring Makana Feigel: Deborra Medina Treating Nikeshia Keetch/Extender: Melburn Hake, HOYT Weeks in Treatment: 6 Wound Status Wound Number: 3 Primary To be determined Etiology: Wound Location: Left Toe Fifth - Lateral Wound Open Wounding Event: Not Known Status: Date Acquired: 08/05/2017 Comorbid Cataracts, Lymphedema, Coronary Artery Weeks Of Treatment: 0 History: Disease, Hypertension, Peripheral Venous Clustered Wound: No Disease, Type II Diabetes, End Stage Renal Disease, Rheumatoid Arthritis, Neuropathy Photos Photo Uploaded By: Secundino Ginger on 11/27/2017 15:57:02 Wound Measurements Length: (cm) 0.7 Width: (cm) 0.6 Depth: (cm) 0.1 Area: (cm) 0.33 Volume: (cm) 0.033 % Reduction in Area: 29.9% % Reduction in Volume: 29.8% Tunneling: No Undermining: No Wound Description Full Thickness Without Exposed Support Foul Classification: Structures Sloug Exudate None Present Amount: Odor After Cleansing: No h/Fibrino No Wound Bed Granulation Amount: None Present (0%) Exposed Structure Necrotic Amount: None Present (0%) Fascia Exposed: No Fat Layer (Subcutaneous Tissue) Exposed: No Tendon Exposed: No Muscle Exposed: No Joint Exposed: No Bone Exposed: No Periwound Skin Texture Lorek, Nivaan S. ([id number]) Texture  Color No Abnormalities Noted: Yes No Abnormalities Noted: Yes Moisture Temperature / Pain No Abnormalities Noted: No Tenderness on Palpation: Yes Dry / Scaly: No Maceration: No Wound Preparation Ulcer Cleansing: Rinsed/Irrigated with Saline Topical Anesthetic Applied: Other: lidocaine 4%, Treatment Notes Wound #3 (Left, Lateral Toe Fifth) Notes Santyl on left dorsal foot; Prisma on others secured with coverlet Electronic Signature(s) Signed: 12/01/2017 3:39:38 PM By: Secundino Ginger Entered By: Secundino Ginger on 11/27/2017 15:19:53 Greenough, [name] ([id number]) -------------------------------------------------------------------------------- Wound Assessment Details Patient Name: Theriault, Jarquavious S. Date of Service: 11/27/2017 3:00 PM Medical Record Number: [id number] Patient Account Number: [id number] Date of Birth/Sex: 12-31-1924 (84 y.o. M) Treating RN: Secundino Ginger Primary Care Marbeth Smedley: Deborra Medina Other Clinician: Referring Shiv Shuey: Deborra Medina Treating Loree Shehata/Extender: Melburn Hake, HOYT Weeks in Treatment: 6 Wound Status Wound Number: 4 Primary Trauma, Other Etiology: Wound Location: Left Foot - Dorsal, Medial Wound Open Wounding Event: Trauma Status: Date Acquired: 11/20/2017 Comorbid Cataracts, Lymphedema, Coronary Artery Weeks Of Treatment: 0 History: Disease, Hypertension, Peripheral Venous Clustered Wound: No Disease, Type II Diabetes, End Stage Renal Disease, Rheumatoid Arthritis, Neuropathy Photos Photo Uploaded By: Sharon Mt on 11/27/2017 16:21:16 Wound Measurements Length: (cm) 0.7 Width: (cm) 1 Depth: (cm) 0.2 Area: (cm) 0.55 Volume: (cm) 0.11 % Reduction in Area: % Reduction in Volume: Epithelialization: None Tunneling: No Undermining: No Wound Description Full Thickness Without Exposed Support Foul Odo Classification: Structures Slough/F Wound Margin: Flat and Intact Exudate Medium Amount: Exudate Type: Serous Exudate Color: amber r  After Cleansing: No ibrino Yes Wound Bed Granulation Amount: None Present (0%) Exposed Structure Necrotic Amount: Large (67-100%) Fascia Exposed: No Necrotic Quality: Eschar, Adherent Slough Fat Layer (Subcutaneous Tissue) Exposed: Yes Tendon Exposed: No Muscle Exposed: No Joint Exposed: No Zenner, Timothy S. ([id number]) Bone Exposed: No Periwound Skin Texture Texture Color No Abnormalities Noted: No No Abnormalities Noted: No Scarring: Yes Temperature / Pain Moisture Temperature: No Abnormality No Abnormalities Noted: No Wound Preparation Ulcer Cleansing: Rinsed/Irrigated with Saline Topical Anesthetic Applied: None Treatment Notes Wound #4 (Left, Medial, Dorsal Foot) Notes Santyl on left dorsal foot; Prisma on others secured with coverlet Electronic Signature(s) Signed: 12/01/2017 3:39:38 PM By: Secundino Ginger Entered By: Secundino Ginger on 11/27/2017 15:30:22 Gholson, [name] ([id number]) -------------------------------------------------------------------------------- Vitals Details Patient  Name: Kimura, VINCENTE ASBRIDGE. Date of Service: 11/27/2017 3:00 PM Medical Record Number: [id number] Patient Account Number: [id number] Date of Birth/Sex: 22-Dec-1924 (84 y.o. M) Treating RN: Cornell Barman Primary Care Kasir Hallenbeck: Deborra Medina Other Clinician: Referring Leyna Vanderkolk: Deborra Medina Treating Briele Lagasse/Extender: Melburn Hake, HOYT Weeks in Treatment: 6 Vital Signs Time Taken: 15:08 Temperature (F): 98.2 Height (in): 68 Pulse (bpm): 66 Weight (lbs): 180 Respiratory Rate (breaths/min): 18 Body Mass Index (BMI): 27.4 Blood Pressure (mmHg): 129/43 Reference Range: 80 - 120 mg / dl Electronic Signature(s) Signed: 11/27/2017 4:35:15 PM By: Lorine Bears RCP, RRT, CHT Entered By: Lorine Bears on 11/27/2017 15:11:48

## 2017-12-02 NOTE — Progress Notes (Signed)
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 NON-WOUND related issues/requests for orders to patient's Primary Care Physician Wound #4  Left,Medial,Dorsal Foot: [place] Visits - [facility]: Monday, Wednesday and Friday Home Health Nurse may visit PRN to address patient s wound care needs. FACE TO FACE ENCOUNTER: MEDICARE and MEDICAID PATIENTS: I certify that this patient is under my care and that I had a face-to-face encounter that meets the physician face-to-face encounter requirements with this patient on this date. The encounter with the patient was in whole or in part for the following MEDICAL CONDITION: (primary reason for [place]) MEDICAL NECESSITY: I certify, that based on my findings, NURSING services are a medically necessary home health service. HOME BOUND STATUS: I certify that my clinical findings support that this patient is homebound (i.e., Due to illness or injury, pt requires aid of supportive devices such as crutches, cane, wheelchairs, walkers, the use of special transportation or the assistance of another person to leave their place of residence. There is a normal inability to leave the home and doing so requires considerable and taxing effort. Other absences are for medical reasons / religious services and are infrequent or of short duration when for other reasons). If current dressing causes regression in wound condition, may D/C ordered dressing product/s and apply Normal Saline Moist Dressing daily until next [place] / Other MD appointment. [place] of regression in wound condition at [phone]. Please direct any NON-WOUND related issues/requests for orders to patient's Primary Care Physician At this time my suggestion is going to be that we continue with the above wound care measures for the next week. I did have an extensive conversation with the patient's daughter concerning appropriate footwear we gave them a handout with some shoes that would actually be perfect for him at this point. They may want to get appear to where inside another pair to go  out and. 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 We are already seeing this at this point. I'm gonna recommend seeing the back for reevaluation next week to see were things stand. Will you Santyl on the dorsal surface of the left foot. Please see above for specific wound care orders. We will see patient for re-evaluation in 1 week(s) here in the clinic. If anything worsens or changes patient will contact our office for additional recommendations. Electronic Signature(s) Signed: 11/28/2017 8:35:57 AM By: Irean Hong Neer, [place] ([id number]) Entered By: Worthy Keeler on 11/27/2017 16:37:56 Villena, [name] ([id number]) -------------------------------------------------------------------------------- ROS/PFSH Details Patient Name: Jeangilles, Daren S. Date of Service: 11/27/2017 3:00 PM Medical Record Number: [id number] Patient Account Number: [id number] Date of Birth/Sex: [birth year] (84 y.o. M) Treating RN: Cornell Barman Primary Care Provider: Deborra Medina Other Clinician: Referring Provider: Deborra Medina Treating Provider/Extender: Melburn Hake, HOYT Weeks in Treatment: 6 Information Obtained From Patient Wound History Constitutional Symptoms (General Health) Complaints and Symptoms: Negative for: Fever; Chills Eyes Medical History: Positive for: Cataracts Negative for: Glaucoma; Optic Neuritis Ear/Nose/Mouth/Throat Medical History: Negative for: Chronic sinus problems/congestion; Middle ear problems Hematologic/Lymphatic Medical History: Positive for: Lymphedema Negative for: Anemia; Hemophilia; Human Immunodeficiency Virus; Sickle Cell Disease Respiratory Complaints and Symptoms: No Complaints or Symptoms Medical History: Negative for: Aspiration; Asthma; Chronic Obstructive Pulmonary Disease (COPD); Pneumothorax; Sleep Apnea; Tuberculosis Cardiovascular Complaints and Symptoms: No  Complaints or Symptoms Medical History: Positive for: Coronary Artery Disease; Hypertension; Peripheral Venous Disease Negative for: Angina;  Arrhythmia; Congestive Heart Failure; Deep Vein Thrombosis; Hypotension; Myocardial Infarction; Peripheral Arterial Disease; Phlebitis; Vasculitis Past Medical History Notes: pacemaker, 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 ([id number]) Medical History: Negative for: Cirrhosis ; Colitis; Crohnos; Hepatitis A; Hepatitis B; Hepatitis C Endocrine Medical History: Positive for: Type II Diabetes - since 1988 Negative for: Type I Diabetes Treated with: Insulin Blood sugar tested every day: Yes Tested : 3 times daily Blood sugar testing results: Breakfast: 114 Genitourinary Medical History: Positive for: End Stage Renal Disease Immunological Medical History: Negative for: Lupus Erythematosus; Raynaudos; Scleroderma Integumentary (Skin) Medical History: Negative for: History of Burn; History of pressure wounds Musculoskeletal Medical History: Positive for: Rheumatoid Arthritis Negative for: Gout; Osteoarthritis; Osteomyelitis Neurologic Medical History: Positive for: Neuropathy - feet and legs Negative for: Dementia; Quadriplegia; Paraplegia; Seizure Disorder Psychiatric Complaints and Symptoms: No Complaints or Symptoms Medical History: Negative for: Anorexia/bulimia; Confinement Anxiety HBO Extended History Items Eyes: Cataracts Immunizations Pneumococcal Vaccine: Received Pneumococcal Vaccination: No Implantable Devices Monarrez, ABUBAKAR CRISPO ([id number]) Family and Social History Former smoker; Marital Status - Widowed; Alcohol Use: Rarely; Drug Use: No History; Caffeine Use: Never; Financial Concerns: No; Food, Clothing or Shelter Needs: No; Support System Lacking: No; Transportation Concerns: No; Advanced Directives: Yes (Not Provided); Patient does not want information on Advanced  Directives; Do not resuscitate: No; Living Will: Yes (Not Provided); Medical Power of Attorney: Yes (Not Provided) Physician Affirmation I have reviewed and agree with the above information. Electronic Signature(s) Signed: 11/27/2017 6:00:52 PM By: Gretta Cool, BSN, RN, CWS, Kim RN, BSN Signed: 11/28/2017 8:35:57 AM By: Worthy Keeler PA-C Entered By: Worthy Keeler on 11/27/2017 16:35:34 Defina, [name] ([id number]) -------------------------------------------------------------------------------- [place] Details Patient Name: Anspaugh, Arick S. Date of Service: 11/27/2017 Medical Record Number: [id number] Patient Account Number: [id number] Date of Birth/Sex: [birth year] (84 y.o. 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 N18.9 Chronic kidney disease, unspecified I73.9 Peripheral vascular disease, unspecified J69.67 Chronic systolic (congestive) heart failure I10 Essential (primary) hypertension Facility Procedures CPT4 Code: 89381017 Description: 99214 - WOUND CARE VISIT-LEV 4 EST PT Modifier: Quantity: 1 Physician Procedures CPT4: Description Modifier Quantity Code [id number] 27782 - WC PHYS LEVEL 4 - EST PT 1 ICD-10 Diagnosis Description E11.621 Type 2 diabetes mellitus with foot ulcer E11.40 Type 2 diabetes mellitus with diabetic neuropathy, unspecified L97.512 Non-pressure  chronic ulcer of other part of right foot with fat layer exposed L97.526 Non-pressure chronic ulcer of other part  of left foot with bone involvement without evidence of necrosis Electronic Signature(s) Signed: 11/27/2017 6:03:37 PM By: Gretta Cool, BSN, RN, CWS, Kim RN, BSN Signed: 11/28/2017 8:35:57 AM By: Worthy Keeler PA-C Entered By: Gretta Cool, BSN, RN, CWS, Kim on 11/27/2017 18:03:37

## 2017-12-02 NOTE — Telephone Encounter (Signed)
[name]'s daughter here again, with some questions about Zontivity: 1) Does "bleeding" mean spontaneous bleeding or problematical bleeding from trauma or surgery? 2) If Dad is taking it, what happens if he needs an urgent surgical procedure? Does he have to wait until the Zontivity is out of his system, or is he given an "antidote" that prevents uncontrolled bleeding? 3) Did [facility] prescribe the 325mg  aspirin?    [facility] had to order the Zontivity so it will not be ready until Thursday, which gives [place] time to be sure it's right for him. Thanks again for your help!   Please Advise.

## 2017-12-03 DIAGNOSIS — L97513 Non-pressure chronic ulcer of other part of right foot with necrosis of muscle: Secondary | ICD-10-CM | POA: Diagnosis not present

## 2017-12-03 DIAGNOSIS — I10 Essential (primary) hypertension: Secondary | ICD-10-CM | POA: Diagnosis not present

## 2017-12-03 DIAGNOSIS — I70203 Unspecified atherosclerosis of native arteries of extremities, bilateral legs: Secondary | ICD-10-CM | POA: Diagnosis not present

## 2017-12-03 DIAGNOSIS — E11621 Type 2 diabetes mellitus with foot ulcer: Secondary | ICD-10-CM | POA: Diagnosis not present

## 2017-12-03 DIAGNOSIS — Z794 Long term (current) use of insulin: Secondary | ICD-10-CM | POA: Diagnosis not present

## 2017-12-03 DIAGNOSIS — M79604 Pain in right leg: Secondary | ICD-10-CM | POA: Diagnosis not present

## 2017-12-04 ENCOUNTER — Encounter: Payer: Medicare Other | Admitting: Physician Assistant

## 2017-12-04 DIAGNOSIS — M79605 Pain in left leg: Secondary | ICD-10-CM | POA: Diagnosis not present

## 2017-12-04 DIAGNOSIS — I5022 Chronic systolic (congestive) heart failure: Secondary | ICD-10-CM | POA: Diagnosis not present

## 2017-12-04 DIAGNOSIS — M79604 Pain in right leg: Secondary | ICD-10-CM | POA: Diagnosis not present

## 2017-12-04 DIAGNOSIS — M6281 Muscle weakness (generalized): Secondary | ICD-10-CM | POA: Diagnosis not present

## 2017-12-04 DIAGNOSIS — E11621 Type 2 diabetes mellitus with foot ulcer: Secondary | ICD-10-CM | POA: Diagnosis not present

## 2017-12-04 DIAGNOSIS — I10 Essential (primary) hypertension: Secondary | ICD-10-CM

## 2017-12-04 DIAGNOSIS — T8189XA Other complications of procedures, not elsewhere classified, initial encounter: Secondary | ICD-10-CM | POA: Diagnosis not present

## 2017-12-04 DIAGNOSIS — M79672 Pain in left foot: Secondary | ICD-10-CM | POA: Diagnosis not present

## 2017-12-04 DIAGNOSIS — L97513 Non-pressure chronic ulcer of other part of right foot with necrosis of muscle: Secondary | ICD-10-CM | POA: Diagnosis not present

## 2017-12-04 DIAGNOSIS — L97529 Non-pressure chronic ulcer of other part of left foot with unspecified severity: Secondary | ICD-10-CM | POA: Diagnosis not present

## 2017-12-04 DIAGNOSIS — I132 Hypertensive heart and chronic kidney disease with heart failure and with stage 5 chronic kidney disease, or end stage renal disease: Secondary | ICD-10-CM | POA: Diagnosis not present

## 2017-12-04 DIAGNOSIS — E1152 Type 2 diabetes mellitus with diabetic peripheral angiopathy with gangrene: Secondary | ICD-10-CM | POA: Diagnosis not present

## 2017-12-04 DIAGNOSIS — L97526 Non-pressure chronic ulcer of other part of left foot with bone involvement without evidence of necrosis: Secondary | ICD-10-CM | POA: Diagnosis not present

## 2017-12-04 DIAGNOSIS — L97512 Non-pressure chronic ulcer of other part of right foot with fat layer exposed: Secondary | ICD-10-CM | POA: Diagnosis not present

## 2017-12-04 DIAGNOSIS — Z794 Long term (current) use of insulin: Secondary | ICD-10-CM | POA: Diagnosis not present

## 2017-12-04 DIAGNOSIS — M79671 Pain in right foot: Secondary | ICD-10-CM | POA: Diagnosis not present

## 2017-12-04 DIAGNOSIS — E1122 Type 2 diabetes mellitus with diabetic chronic kidney disease: Secondary | ICD-10-CM | POA: Diagnosis not present

## 2017-12-04 DIAGNOSIS — R2689 Other abnormalities of gait and mobility: Secondary | ICD-10-CM | POA: Diagnosis not present

## 2017-12-04 DIAGNOSIS — I70203 Unspecified atherosclerosis of native arteries of extremities, bilateral legs: Secondary | ICD-10-CM | POA: Diagnosis not present

## 2017-12-05 DIAGNOSIS — M79604 Pain in right leg: Secondary | ICD-10-CM | POA: Diagnosis not present

## 2017-12-05 DIAGNOSIS — E11621 Type 2 diabetes mellitus with foot ulcer: Secondary | ICD-10-CM | POA: Diagnosis not present

## 2017-12-05 DIAGNOSIS — L97513 Non-pressure chronic ulcer of other part of right foot with necrosis of muscle: Secondary | ICD-10-CM | POA: Diagnosis not present

## 2017-12-05 DIAGNOSIS — I10 Essential (primary) hypertension: Secondary | ICD-10-CM | POA: Diagnosis not present

## 2017-12-05 DIAGNOSIS — Z794 Long term (current) use of insulin: Secondary | ICD-10-CM | POA: Diagnosis not present

## 2017-12-05 DIAGNOSIS — I70203 Unspecified atherosclerosis of native arteries of extremities, bilateral legs: Secondary | ICD-10-CM | POA: Diagnosis not present

## 2017-12-07 NOTE — Progress Notes (Signed)
LATRAVION, GRAVES ([id number]) Visit Report for 12/04/2017 Arrival Information Details Patient Name: [name], [name]. Date of Service: 12/04/2017 11:15 AM Medical Record Number: [id number] Patient Account Number: [id number] Date of Birth/Sex: 13-Dec-1924 (84 y.o. M) Treating RN: Montey Hora Primary Care Jani Ploeger: Deborra Medina Other Clinician: Referring Halo Laski: Deborra Medina Treating Thirza Pellicano/Extender: Melburn Hake, HOYT Weeks in Treatment: 7 Visit Information History Since Last Visit Added or deleted any medications: Yes Patient Arrived: Walker Any new allergies or adverse reactions: No Arrival Time: 11:00 Had a fall or experienced change in No Accompanied By: self activities of daily living that may affect Transfer Assistance: None risk of falls: Patient Identification Verified: Yes Signs or symptoms of abuse/neglect since last visito No Secondary Verification Process Completed: Yes Hospitalized since last visit: No Implantable device outside of the clinic excluding No cellular tissue based products placed in the center since last visit: Has Dressing in Place as Prescribed: Yes Pain Present Now: No Electronic Signature(s) Signed: 12/04/2017 2:15:53 PM By: Lorine Bears RCP, RRT, CHT Entered By: Becky Sax, Amado Nash on 12/04/2017 11:01:17 Devine, [name] ([id number]) -------------------------------------------------------------------------------- Clinic Level of Care Assessment Details Patient Name: [name], [name]. Date of Service: 12/04/2017 11:15 AM Medical Record Number: [id number] Patient Account Number: [id number] Date of Birth/Sex: [birth year] (84 y.o. M) Treating RN: Montey Hora Primary Care Kimberli Winne: Deborra Medina Other Clinician: Referring Lindia Garms: Deborra Medina Treating Leeloo Silverthorne/Extender: Melburn Hake, HOYT Weeks in Treatment: 7 Clinic Level of Care Assessment Items TOOL 4 Quantity Score []  - Use when only an EandM is performed on  FOLLOW-UP visit 0 ASSESSMENTS - Nursing Assessment / Reassessment X - Reassessment of Co-morbidities (includes updates in patient status) 1 10 X- 1 5 Reassessment of Adherence to Treatment Plan ASSESSMENTS - Wound and Skin Assessment / Reassessment []  - Simple Wound Assessment / Reassessment - one wound 0 X- 3 5 Complex Wound Assessment / Reassessment - multiple wounds []  - 0 Dermatologic / Skin Assessment (not related to wound area) ASSESSMENTS - Focused Assessment []  - Circumferential Edema Measurements - multi extremities 0 []  - 0 Nutritional Assessment / Counseling / Intervention X- 1 5 Lower Extremity Assessment (monofilament, tuning fork, pulses) []  - 0 Peripheral Arterial Disease Assessment (using hand held doppler) ASSESSMENTS - Ostomy and/or Continence Assessment and Care []  - Incontinence Assessment and Management 0 []  - 0 Ostomy Care Assessment and Management (repouching, etc.) PROCESS - Coordination of Care X - Simple Patient / Family Education for ongoing care 1 15 []  - 0 Complex (extensive) Patient / Family Education for ongoing care []  - 0 Staff obtains Programmer, systems, Records, Test Results / Process Orders []  - 0 Staff telephones HHA, Nursing Homes / Clarify orders / etc []  - 0 Routine Transfer to another Facility (non-emergent condition) []  - 0 Routine Hospital Admission (non-emergent condition) []  - 0 New Admissions / Biomedical engineer / Ordering NPWT, Apligraf, etc. []  - 0 Emergency Hospital Admission (emergent condition) X- 1 10 Simple Discharge Coordination [name], [name] ([id number]) []  - 0 Complex (extensive) Discharge Coordination PROCESS - Special Needs []  - Pediatric / Minor Patient Management 0 []  - 0 Isolation Patient Management []  - 0 Hearing / Language / Visual special needs []  - 0 Assessment of Community assistance (transportation, D/C planning, etc.) []  - 0 Additional assistance / Altered mentation []  - 0 Support Surface(s)  Assessment (bed, cushion, seat, etc.) INTERVENTIONS - Wound Cleansing / Measurement []  - Simple Wound Cleansing - one wound 0 X- 3 5 Complex Wound Cleansing - multiple wounds []  -  0 Wound Imaging (photographs - any number of wounds) X- 1 5 Wound Tracing (instead of photographs) []  - 0 Simple Wound Measurement - one wound X- 3 5 Complex Wound Measurement - multiple wounds INTERVENTIONS - Wound Dressings X - Small Wound Dressing one or multiple wounds 3 10 []  - 0 Medium Wound Dressing one or multiple wounds []  - 0 Large Wound Dressing one or multiple wounds []  - 0 Application of Medications - topical []  - 0 Application of Medications - injection INTERVENTIONS - Miscellaneous []  - External ear exam 0 []  - 0 Specimen Collection (cultures, biopsies, blood, body fluids, etc.) []  - 0 Specimen(s) / Culture(s) sent or taken to Lab for analysis []  - 0 Patient Transfer (multiple staff / Civil Service fast streamer / Similar devices) []  - 0 Simple Staple / Suture removal (25 or less) []  - 0 Complex Staple / Suture removal (26 or more) []  - 0 Hypo / Hyperglycemic Management (close monitor of Blood Glucose) []  - 0 Ankle / Brachial Index (ABI) - do not check if billed separately X- 1 5 Vital Signs [name], [name] ([id number]) Has the patient been seen at the hospital within the last three years: Yes Total Score: 130 Level Of Care: ____ Electronic Signature(s) Signed: 12/05/2017 5:38:25 PM By: Montey Hora Entered By: Montey Hora on 12/04/2017 12:37:43 Scherr, [name] ([id number]) -------------------------------------------------------------------------------- Encounter Discharge Information Details Patient Name: [name], [name] Date of Service: 12/04/2017 11:15 AM Medical Record Number: [id number] Patient Account Number: [id number] Date of Birth/Sex: May 14, 1924 (84 y.o. M) Treating RN: Montey Hora Primary Care Kitrina Maurin: Deborra Medina Other Clinician: Referring Robynn Marcel: Deborra Medina Treating Karn Derk/Extender: Melburn Hake, HOYT Weeks in Treatment: 7 Encounter Discharge Information Items Discharge Condition: Stable Ambulatory Status: Walker Discharge Destination: Home Transportation: Private Auto Accompanied By: self Schedule Follow-up Appointment: Yes Clinical Summary of Care: Electronic Signature(s) Signed: 12/04/2017 12:39:17 PM By: Montey Hora Entered By: Montey Hora on 12/04/2017 12:39:17 Keator, [name] ([id number]) -------------------------------------------------------------------------------- Lower Extremity Assessment Details Patient Name: [name], [name] Date of Service: 12/04/2017 11:15 AM Medical Record Number: [id number] Patient Account Number: [id number] Date of Birth/Sex: [birth year] (84 y.o. M) Treating RN: Secundino Ginger Primary Care Damante Spragg: Deborra Medina Other Clinician: Referring Mishael Krysiak: Deborra Medina Treating Shantanu Strauch/Extender: Melburn Hake, HOYT Weeks in Treatment: 7 Edema Assessment Assessed: Shirlyn Goltz: No] [Right: No] [Left: Edema] [Right: :] Calf Left: Right: Point of Measurement: 30 cm From Medial Instep cm cm Ankle Left: Right: Point of Measurement: 10 cm From Medial Instep cm cm Vascular Assessment Pulses: Dorsalis Pedis Palpable: [Left:Yes] [Right:Yes] Posterior Tibial Extremity colors, hair growth, and conditions: Extremity Color: [Left:Normal] [Right:Normal] Electronic Signature(s) Signed: 12/04/2017 11:42:53 AM By: Secundino Ginger Entered By: Secundino Ginger on 12/04/2017 11:21:20 Rogacki, [name] ([id number]) -------------------------------------------------------------------------------- Multi Wound Chart Details Patient Name: [name], [name] Date of Service: 12/04/2017 11:15 AM Medical Record Number: [id number] Patient Account Number: [id number] Date of Birth/Sex: 03-14-1924 (84 y.o. 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] [4:Trauma, Other] Secondary Etiology: [1:Arterial Insufficiency Ulcer] [3:N/A] [4:N/A] Comorbid History: [1:Cataracts, Lymphedema, Coronary Artery Disease, Hypertension,  Peripheral Venous Disease, Type II Diabetes, End Stage Renal Disease, Rheumatoid Arthritis, Neuropathy] [3:Cataracts, Lymphedema, Coronary Artery Disease, Hypertension,  Peripheral Venous Disease, Type II Diabetes, End Stage Renal Disease, Rheumatoid Arthritis, Neuropathy] [4:Cataracts, Lymphedema, Coronary Artery Disease, Hypertension, Peripheral Venous Disease, Type II Diabetes, End Stage Renal Disease, Rheumatoid  Arthritis, Neuropathy] Date Acquired: [1:06/04/2017] [3:08/05/2017] [4:11/20/2017] Weeks of Treatment: [1:7] [3:1] [4:1] Wound Status: [1:Open] [3:Open] [4:Open] Pending Amputation on [1:Yes] [3:No] [4:No] Presentation: Measurements L x W x D [1:0.8x0.8x0.3] [3:0.7x0.6x0.1] [4:1x0.6x0.2] (cm) Area (cm) : [1:0.503] [3:0.33] [4:0.471] Volume (cm) : [1:0.151] [3:0.033] [4:0.094] % Reduction in Area: [1:57.30%] [3:29.90%] [4:14.40%] % Reduction in Volume: [1:36.00%] [3:29.80%] [4:14.50%] Classification: [1:Grade 2] [3:Full Thickness Without Exposed Support Structures] [4:Full Thickness Without Exposed Support Structures] Exudate Amount: [1:Small] [3:None Present] [4:Medium] Exudate Type: [1:Serous] [3:N/A] [4:Purulent] Exudate Color: [1:amber] [3:N/A] [4:yellow, brown,  green] Wound Margin: [1:Distinct, outline attached] [3:N/A] [4:Flat and Intact] Granulation Amount: [1:None Present (0%)] [3:None Present (0%)] [4:None Present (0%)] Necrotic Amount: [1:Small (1-33%)] [3:None Present (0%)] [4:Large (67-100%)] Necrotic Tissue: [1:Adherent Slough] [3:N/A] [4:Eschar, Adherent Slough] Exposed Structures: [1:Fat Layer (Subcutaneous Tissue) Exposed: Yes Fascia: No Tendon: No] [3:Fascia: No Fat Layer (Subcutaneous Tissue) Exposed: No Tendon: No] [4:Fat Layer (Subcutaneous Tissue) Exposed: Yes Fascia: No Tendon: No] Muscle: No Muscle: No Muscle: No Joint: No Joint: No Joint: No Bone: No Bone: No Bone: No Epithelialization: None N/A None Periwound Skin Texture: Excoriation: No No Abnormalities Noted Scarring: Yes Induration: No Callus: No Crepitus: No Rash: No Scarring: No Periwound Skin Moisture: Maceration: Yes Maceration: No No Abnormalities Noted Dry/Scaly: No Dry/Scaly: No Periwound Skin Color: Atrophie Blanche: No No Abnormalities Noted No Abnormalities Noted Cyanosis: No Ecchymosis: No Erythema: No Hemosiderin Staining: No Mottled: No Pallor: No Rubor: No Temperature: No Abnormality N/A No Abnormality Tenderness on Palpation: Yes Yes No Wound Preparation: Ulcer Cleansing: Ulcer Cleansing: Ulcer Cleansing: Rinsed/Irrigated with Saline Rinsed/Irrigated with Saline Rinsed/Irrigated with Saline Topical Anesthetic Applied: Topical Anesthetic Applied: Topical Anesthetic Applied: Other: lidocaine 4% Other: lidocaine 4% Other: lidocaine 4% Treatment Notes Electronic Signature(s) Signed: 12/05/2017 5:38:25 PM By: Montey Hora Entered By: Montey Hora on 12/04/2017 11:27:55 Cardamone, [name] ([id number]) -------------------------------------------------------------------------------- [place] Details Patient Name: [name], [name] Date of Service: 12/04/2017 11:15 AM Medical Record Number: [id number] Patient  Account Number: [id number] Date of Birth/Sex: 03/01/1924 (84 y.o. M) Treating RN: Montey Hora Primary Care Rossetta Kama: Deborra Medina Other Clinician: Referring Melody Cirrincione: Deborra Medina Treating Crissa Sowder/Extender: Melburn Hake, HOYT Weeks in Treatment: 7 Active Inactive ` Abuse / Safety / Falls / Self Care Management Nursing Diagnoses: Potential for falls Goals: Patient will remain injury free related to falls Date Initiated: 10/10/2017 Target Resolution Date: 01/02/2018 Goal Status: Active Interventions: Assess fall risk on admission and as needed Notes: ` Orientation to the Wound Care Program Nursing Diagnoses: Knowledge deficit related to the wound healing center program Goals: Patient/caregiver will verbalize understanding of the [place] Date Initiated: 10/10/2017 Target Resolution Date: 01/02/2018 Goal Status: Active Interventions: Provide education on orientation to the wound center Notes: ` Pain, Acute or Chronic Nursing Diagnoses: Potential alteration in comfort, pain Goals: Patient/caregiver will verbalize adequate pain control between visits Date Initiated: 10/10/2017 Target Resolution Date: 01/02/2018 Goal Status: Active Interventions: Scully, SALIH WILLIAMSON. ([id number]) Complete pain assessment as per visit requirements Notes: ` Wound/Skin Impairment Nursing Diagnoses: Impaired tissue integrity Goals: Ulcer/skin breakdown will heal within 14 weeks Date Initiated: 10/10/2017 Target Resolution Date: 01/02/2018 Goal Status: Active Interventions: Assess patient/caregiver ability to obtain necessary supplies Assess patient/caregiver ability to  perform ulcer/skin care regimen upon admission and as needed Assess ulceration(s) every visit Notes: Electronic Signature(s) Signed: 12/05/2017 5:38:25 PM By: Montey Hora Entered By: Montey Hora on 12/04/2017 11:27:22 Carboni, [name]  ([id number]) -------------------------------------------------------------------------------- Pain Assessment Details Patient Name: [name], [name] Date of Service: 12/04/2017 11:15 AM Medical Record Number: [id number] Patient Account Number: [id number] Date of Birth/Sex: [birth year] (84 y.o. M) Treating RN: Montey Hora Primary Care Tvisha Schwoerer: Deborra Medina Other Clinician: Referring Akshitha Culmer: Deborra Medina Treating Kenston Longton/Extender: Melburn Hake, HOYT Weeks in Treatment: 7 Active Problems Location of Pain Severity and Description of Pain Patient Has Paino No Site Locations Pain Management and Medication Current Pain Management: Electronic Signature(s) Signed: 12/04/2017 2:15:53 PM By: Paulla Fore, RRT, CHT Signed: 12/05/2017 5:38:25 PM By: Montey Hora Entered By: Lorine Bears on 12/04/2017 11:01:25 Render, [name] ([id number]) -------------------------------------------------------------------------------- Patient/Caregiver Education Details Patient Name: Goettl, Jaquaveon S. Date of Service: 12/04/2017 11:15 AM Medical Record Number: [id number] Patient Account Number: [id number] Date of Birth/Gender: [birth year] (84 y.o. M) Treating RN: Montey Hora Primary Care Physician: Deborra Medina Other Clinician: Referring Physician: Deborra Medina Treating Physician/Extender: Sharalyn Ink in Treatment: 7 Education Assessment Education Provided To: Patient Education Topics Provided Tissue Oxygenation: Handouts: Other: blood flow issues and wound healing Electronic Signature(s) Signed: 12/05/2017 5:38:25 PM By: Montey Hora Entered By: Montey Hora on 12/04/2017 11:35:42 Roldan, [name] ([id number]) -------------------------------------------------------------------------------- Wound Assessment Details Patient Name: [name], [name] Date of Service: 12/04/2017 11:15 AM Medical Record Number: [id number] Patient Account  Number: [id number] Date of Birth/Sex: 14-Oct-1924 (84 y.o. M) Treating RN: Secundino Ginger Primary Care Orestes Geiman: Deborra Medina Other Clinician: Referring Arlesia Kiel: Deborra Medina Treating Monesha Monreal/Extender: Melburn Hake, HOYT Weeks in Treatment: 7 Wound Status Wound Number: 1 Primary Diabetic Wound/Ulcer of the Lower Extremity Etiology: Wound Location: Right Toe Second Secondary Arterial Insufficiency Ulcer Wounding Event: Gradually Appeared Etiology: Date Acquired: 06/04/2017 Wound Open Weeks Of Treatment: 7 Status: Clustered Wound: No Comorbid Cataracts, Lymphedema, Coronary Artery Pending Amputation On Presentation History: Disease, Hypertension, Peripheral Venous Disease, Type II Diabetes, End Stage Renal Disease, Rheumatoid Arthritis, Neuropathy Photos Photo Uploaded By: Secundino Ginger on 12/04/2017 11:36:27 Wound Measurements Length: (cm) 0.8 Width: (cm) 0.8 Depth: (cm) 0.3 Area: (cm) 0.503 Volume: (cm) 0.151 % Reduction in Area: 57.3% % Reduction in Volume: 36% Epithelialization: None Tunneling: No Undermining: No Wound Description Classification: Grade 2 Foul Odor Wound Margin: Distinct, outline attached Slough/Fib Exudate Amount: Small Exudate Type: Serous Exudate Color: amber After Cleansing: No rino No Wound Bed Granulation Amount: None Present (0%) Exposed Structure Necrotic Amount: Small (1-33%) Fascia Exposed: No Necrotic Quality: Adherent Slough Fat Layer (Subcutaneous Tissue) Exposed: Yes Tendon Exposed: No Muscle Exposed: No Joint Exposed: No Dickerson, Nichalos S. ([id number]) Bone Exposed: No Periwound Skin Texture Texture Color No Abnormalities Noted: No No Abnormalities Noted: No Callus: No Atrophie Blanche: No Crepitus: No Cyanosis: No Excoriation: No Ecchymosis: No Induration: No Erythema: No Rash: No Hemosiderin Staining: No Scarring: No Mottled: No Pallor: No Moisture Rubor: No No Abnormalities Noted: No Dry / Scaly: No Temperature /  Pain Maceration: Yes Temperature: No Abnormality Tenderness on Palpation: Yes Wound Preparation Ulcer Cleansing: Rinsed/Irrigated with Saline Topical Anesthetic Applied: Other: lidocaine 4%, Treatment Notes Wound #1 (Right Toe Second) Notes Santyl on left dorsal foot; silvercel on all wounds, in between toes and over santyl, gauze/conform Electronic Signature(s) Signed: 12/04/2017 11:42:53 AM By: Secundino Ginger Entered By: Secundino Ginger on 12/04/2017 11:14:23 Starr, [name] ([id number]) -------------------------------------------------------------------------------- Wound Assessment Details Patient Name: [name], [name] Date  of Service: 12/04/2017 11:15 AM Medical Record Number: [id number] Patient Account Number: [id number] Date of Birth/Sex: 14-Apr-1924 (84 y.o. M) Treating RN: Secundino Ginger Primary Care Rashanna Christiana: Deborra Medina Other Clinician: Referring Cloma Rahrig: Deborra Medina Treating Esbeydi Manago/Extender: Melburn Hake, HOYT Weeks in Treatment: 7 Wound Status Wound Number: 3 Primary Arterial Insufficiency Ulcer Etiology: Wound Location: Left Toe Fifth - Lateral Wound Open Wounding Event: Not Known Status: Date Acquired: 08/05/2017 Comorbid Cataracts, Lymphedema, Coronary Artery Weeks Of Treatment: 1 History: Disease, Hypertension, Peripheral Venous Clustered Wound: No Disease, Type II Diabetes, End Stage Renal Disease, Rheumatoid Arthritis, Neuropathy Photos Photo Uploaded By: Secundino Ginger on 12/04/2017 11:37:48 Wound Measurements Length: (cm) 0.7 Width: (cm) 0.6 Depth: (cm) 0.1 Area: (cm) 0.33 Volume: (cm) 0.033 % Reduction in Area: 29.9% % Reduction in Volume: 29.8% Tunneling: No Undermining: No Wound Description Full Thickness Without Exposed Support Foul Classification: Structures Slou Exudate None Present Amount: Odor After Cleansing: No gh/Fibrino No Wound Bed Granulation Amount: None Present (0%) Exposed Structure Necrotic Amount: None Present (0%) Fascia  Exposed: No Fat Layer (Subcutaneous Tissue) Exposed: No Tendon Exposed: No Muscle Exposed: No Joint Exposed: No Bone Exposed: No Periwound Skin Texture Facundo, Amarian S. ([id number]) Texture Color No Abnormalities Noted: Yes No Abnormalities Noted: Yes Moisture Temperature / Pain No Abnormalities Noted: No Tenderness on Palpation: Yes Dry / Scaly: No Maceration: No Wound Preparation Ulcer Cleansing: Rinsed/Irrigated with Saline Topical Anesthetic Applied: Other: lidocaine 4%, Electronic Signature(s) Signed: 12/04/2017 11:42:53 AM By: Secundino Ginger Entered By: Secundino Ginger on 12/04/2017 11:17:59 [name], [name] ([id number]) -------------------------------------------------------------------------------- Wound Assessment Details Patient Name: Ruehl, Bartow S. Date of Service: 12/04/2017 11:15 AM Medical Record Number: [id number] Patient Account Number: [id number] Date of Birth/Sex: 03/31/1924 (84 y.o. M) Treating RN: Secundino Ginger Primary Care Brick Ketcher: Deborra Medina Other Clinician: Referring Kimie Pidcock: Deborra Medina Treating Aika Brzoska/Extender: Melburn Hake, HOYT Weeks in Treatment: 7 Wound Status Wound Number: 4 Primary Trauma, Other Etiology: Wound Location: Left Foot - Dorsal, Medial Wound Open Wounding Event: Trauma Status: Date Acquired: 11/20/2017 Comorbid Cataracts, Lymphedema, Coronary Artery Weeks Of Treatment: 1 History: Disease, Hypertension, Peripheral Venous Clustered Wound: No Disease, Type II Diabetes, End Stage Renal Disease, Rheumatoid Arthritis, Neuropathy Photos Photo Uploaded By: Secundino Ginger on 12/04/2017 11:37:48 Wound Measurements Length: (cm) 1 Width: (cm) 0.6 Depth: (cm) 0.2 Area: (cm) 0.471 Volume: (cm) 0.094 % Reduction in Area: 14.4% % Reduction in Volume: 14.5% Epithelialization: None Tunneling: No Undermining: No Wound Description Full Thickness Without Exposed Support Classification: Structures Wound Margin: Flat and  Intact Exudate Medium Amount: Exudate Type: Purulent Exudate Color: yellow, brown, green Foul Odor After Cleansing: No Slough/Fibrino Yes Wound Bed Granulation Amount: None Present (0%) Exposed Structure Necrotic Amount: Large (67-100%) Fascia Exposed: No Necrotic Quality: Eschar, Adherent Slough Fat Layer (Subcutaneous Tissue) Exposed: Yes Tendon Exposed: No Muscle Exposed: No Joint Exposed: No Demetrius, Jessie S. ([id number]) Bone Exposed: No Periwound Skin Texture Texture Color No Abnormalities Noted: No No Abnormalities Noted: No Scarring: Yes Temperature / Pain Moisture Temperature: No Abnormality No Abnormalities Noted: No Wound Preparation Ulcer Cleansing: Rinsed/Irrigated with Saline Topical Anesthetic Applied: Other: lidocaine 4%, Electronic Signature(s) Signed: 12/04/2017 11:42:53 AM By: Secundino Ginger Entered By: Secundino Ginger on 12/04/2017 11:11:19 Alyea, [name] ([id number]) -------------------------------------------------------------------------------- [place] Details Patient Name: John, Amel S. Date of Service: 12/04/2017 11:15 AM Medical Record Number: [id number] Patient Account Number: [id number] Date of Birth/Sex: 12-17-1924 (84 y.o. M) Treating RN: Montey Hora Primary Care Mattingly Fountaine: Deborra Medina Other Clinician: Referring Karmelo Bass: Deborra Medina Treating Aydan Levitz/Extender: Melburn Hake, HOYT Weeks in Treatment:  7 Vital Signs Time Taken: 11:00 Temperature (F): 98.2 Height (in): 68 Pulse (bpm): 79 Weight (lbs): 180 Respiratory Rate (breaths/min): 18 Body Mass Index (BMI): 27.4 Blood Pressure (mmHg): 143/53 Reference Range: 80 - 120 mg / dl Electronic Signature(s) Signed: 12/04/2017 2:15:53 PM By: Lorine Bears RCP, RRT, CHT Entered By: Lorine Bears on 12/04/2017 11:01:56

## 2017-12-07 NOTE — Progress Notes (Signed)
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 physician face-to-face encounter requirements with this patient on this date. The encounter with the  patient was in whole or in part for the following MEDICAL CONDITION: (primary reason for Ama) MEDICAL NECESSITY: I certify, that based on my findings, NURSING services are a medically necessary home health service. HOME BOUND STATUS: I certify that my clinical findings support that this patient is homebound (i.e., Due to illness or injury, pt requires aid of supportive devices such as crutches, cane, wheelchairs, walkers, the use of special transportation or the assistance of another person to leave their place of residence. There is a normal inability to leave the home and doing so requires considerable and taxing effort. Other absences are for medical reasons / religious services and are infrequent or of short duration when for other reasons). If current dressing causes regression in wound condition, may D/C ordered dressing product/s and apply Normal Saline Moist Dressing daily until next [place] / Other MD appointment. [place] of regression in wound condition at [phone]. Please direct any NON-WOUND related issues/requests for orders to patient's Primary Care Physician Wound #3 Left,Lateral Toe Fifth: [facility] Visits - [facility]: Monday, Wednesday and Friday Home Health Nurse may visit PRN to address patient s wound care needs. FACE TO FACE ENCOUNTER: MEDICARE and MEDICAID PATIENTS: I certify that this patient is under my care and that I had a face-to-face encounter that meets the physician face-to-face encounter requirements with this patient on this date. The encounter with the patient was in whole or in part for the following MEDICAL CONDITION: (primary reason for [place]) MEDICAL NECESSITY: I certify, that based on my findings, NURSING services are a medically necessary home health service. HOME BOUND STATUS: I certify that my clinical findings support that this patient is homebound (i.e., Due to illness or injury, pt  requires aid of supportive devices such as crutches, cane, wheelchairs, walkers, the use of special transportation or the assistance of another person to leave their place of residence. There is a normal inability to leave the home and doing so requires considerable and taxing effort. Other absences are for medical reasons / religious services and are infrequent or of short duration when for other reasons). If current dressing causes regression in wound condition, may D/C ordered dressing product/s and apply Normal Saline Moist Dressing daily until next [place] / Other MD appointment. [place] of regression in wound condition at [phone]. Please direct any NON-WOUND related issues/requests for orders to patient's Primary Care Physician Wound #4 Left,Medial,Dorsal Foot: [place] Visits - [facility]: Monday, Wednesday and Friday Home Health Nurse may visit PRN to address patient s wound care needs. FACE TO FACE ENCOUNTER: MEDICARE and MEDICAID PATIENTS: I certify that this patient is under my care and that I had a face-to-face encounter that meets the physician face-to-face encounter requirements with this patient on this date. The encounter with the patient was in whole or in part for the following MEDICAL CONDITION: (  primary reason for Home Healthcare) MEDICAL NECESSITY: I certify, that based on my findings, NURSING services are a medically necessary home health service. HOME BOUND STATUS: I certify that my clinical findings support that this patient is homebound (i.e., Due to illness or injury, pt requires aid of supportive devices such as crutches, cane, wheelchairs, walkers, the use of special transportation or the assistance of another person to leave their place of residence. There is a normal inability to leave the home and doing so requires considerable and taxing effort. Other absences are for medical reasons / religious services and are  infrequent or of short duration when for other reasons). If current dressing causes regression in wound condition, may D/C ordered dressing product/s and apply Normal Saline Moist Dressing daily until next [place] / Other MD appointment. [place] of regression in wound condition at [phone]. Please direct any NON-WOUND related issues/requests for orders to patient's Primary Care Physician [place], ADEM COSTLOW ([id number]) At this point my big concern is whether or not traditional wound care measures are actually going to be beneficial as far as helping this area to heal in regard to the right second toe or to be honest even the left fifth toe. The surgical site at the left third toe does not appear to be doing to well. If anything changes or worsens meantime the patient can contact the office and let us know. Otherwise I'm gonna see him back for reevaluation in one weeks time. I'm also gonna tend to get in touch with Dr. dew nor to discuss where things are going to go treatment option wise going forward. I discussed with the patient that I'm unsure as to whether his wounds are actually going to heal with his microvascular status. This may mean that the best option for getting things to close not continue to give him trouble may be conservative amputation. Nonetheless I'm not really sure where he stands at this point and to be honest also I'm not sure where the family stand as far as advice to him in this regard. Obviously this is not the ideal situation but I'm beginning to think that it might be the safest situation since his wound to not seem to be showing signs of improvement week by week. I'm gonna recommend see him back for reevaluation next week to see were things stand. Please see above for specific wound care orders. We will see patient for re-evaluation in 1 week(s) here in the clinic. If anything worsens or changes patient will contact our office for  additional recommendations. Electronic Signature(s) Signed: 12/04/2017 5:20:35 PM By: Worthy Keeler PA-C Entered By: Worthy Keeler on 12/04/2017 14:17:45 Rueb, [name] ([id number]) -------------------------------------------------------------------------------- ROS/PFSH Details Patient Name: Henrickson, Garlen S. Date of Service: 12/04/2017 11:15 AM Medical Record Number: [id number] Patient Account Number: [id number] Date of Birth/Sex: 1924/03/30 (84 y.o. M) Treating RN: Montey Hora Primary Care Provider: Deborra Medina Other Clinician: Referring Provider: Deborra Medina Treating Provider/Extender: Melburn Hake, HOYT Weeks in Treatment: 7 Information Obtained From Patient Wound History Constitutional Symptoms (General Health) Complaints and Symptoms: Negative for: Fever; Chills Eyes Medical History: Positive for: Cataracts Negative for: Glaucoma; Optic Neuritis Ear/Nose/Mouth/Throat Medical History: Negative for: Chronic sinus problems/congestion; Middle ear problems Hematologic/Lymphatic Medical History: Positive for: Lymphedema Negative for: Anemia; Hemophilia; Human Immunodeficiency Virus; Sickle Cell Disease Respiratory Complaints and Symptoms: No Complaints or Symptoms Medical History: Negative for: Aspiration; Asthma; Chronic Obstructive Pulmonary Disease (COPD); Pneumothorax; Sleep Apnea; Tuberculosis Cardiovascular Complaints and Symptoms: No  Complaints or Symptoms Medical History: Positive for: Coronary Artery Disease; Hypertension; Peripheral Venous Disease Negative for: Angina; Arrhythmia; Congestive Heart Failure; Deep Vein Thrombosis; Hypotension; Myocardial Infarction; Peripheral Arterial Disease; Phlebitis; Vasculitis Past Medical History Notes: pacemaker, 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 ([id number]) Medical History: Negative for: Cirrhosis ; Colitis; Crohnos; Hepatitis A; Hepatitis B; Hepatitis  C Endocrine Medical History: Positive for: Type II Diabetes - since 1988 Negative for: Type I Diabetes Treated with: Insulin Blood sugar tested every day: Yes Tested : 3 times daily Blood sugar testing results: Breakfast: 114 Genitourinary Medical History: Positive for: End Stage Renal Disease Immunological Medical History: Negative for: Lupus Erythematosus; Raynaudos; Scleroderma Integumentary (Skin) Medical History: Negative for: History of Burn; History of pressure wounds Musculoskeletal Medical History: Positive for: Rheumatoid Arthritis Negative for: Gout; Osteoarthritis; Osteomyelitis Neurologic Medical History: Positive for: Neuropathy - feet and legs Negative for: Dementia; Quadriplegia; Paraplegia; Seizure Disorder Psychiatric Complaints and Symptoms: No Complaints or Symptoms Medical History: Negative for: Anorexia/bulimia; Confinement Anxiety HBO Extended History Items Eyes: Cataracts Immunizations Pneumococcal Vaccine: Received Pneumococcal Vaccination: No Implantable Devices Musgrave, JUNIPER SNYDERS ([id number]) Family and Social History Former smoker; Marital Status - Widowed; Alcohol Use: Rarely; Drug Use: No History; Caffeine Use: Never; Financial Concerns: No; Food, Clothing or Shelter Needs: No; Support System Lacking: No; Transportation Concerns: No; Advanced Directives: Yes (Not Provided); Patient does not want information on Advanced Directives; Do not resuscitate: No; Living Will: Yes (Not Provided); Medical Power of Attorney: Yes (Not Provided) Physician Affirmation I have reviewed and agree with the above information. Electronic Signature(s) Signed: 12/04/2017 5:20:35 PM By: Worthy Keeler PA-C Signed: 12/05/2017 5:38:25 PM By: Montey Hora Entered By: Worthy Keeler on 12/04/2017 14:13:36 Schimming, [name] ([id number]) -------------------------------------------------------------------------------- SuperBill Details Patient Name: Corbello,  Prospero S. Date of Service: 12/04/2017 Medical Record Number: [id number] Patient Account Number: [id number] Date of Birth/Sex: [birth year] (84 y.o. 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 N18.9 Chronic kidney disease, unspecified I73.9 Peripheral vascular disease, unspecified F09.32 Chronic systolic (congestive) heart failure I10 Essential (primary) hypertension Facility Procedures CPT4 Code: 35573220 Description: 99214 - WOUND CARE VISIT-LEV 4 EST PT Modifier: Quantity: 1 Physician Procedures CPT4: Description Modifier Quantity Code [id number] - WC PHYS LEVEL 4 - EST PT 1 ICD-10 Diagnosis Description E11.621 Type 2 diabetes mellitus with foot ulcer E11.40 Type 2 diabetes mellitus with diabetic neuropathy, unspecified L97.512 Non-pressure  chronic ulcer of other part of right foot with fat layer exposed 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

## 2017-12-08 DIAGNOSIS — I10 Essential (primary) hypertension: Secondary | ICD-10-CM | POA: Diagnosis not present

## 2017-12-08 DIAGNOSIS — Z794 Long term (current) use of insulin: Secondary | ICD-10-CM | POA: Diagnosis not present

## 2017-12-08 DIAGNOSIS — M79604 Pain in right leg: Secondary | ICD-10-CM | POA: Diagnosis not present

## 2017-12-08 DIAGNOSIS — E11621 Type 2 diabetes mellitus with foot ulcer: Secondary | ICD-10-CM | POA: Diagnosis not present

## 2017-12-08 DIAGNOSIS — L97513 Non-pressure chronic ulcer of other part of right foot with necrosis of muscle: Secondary | ICD-10-CM | POA: Diagnosis not present

## 2017-12-08 DIAGNOSIS — I70203 Unspecified atherosclerosis of native arteries of extremities, bilateral legs: Secondary | ICD-10-CM | POA: Diagnosis not present

## 2017-12-10 ENCOUNTER — Other Ambulatory Visit: Payer: Self-pay | Admitting: Internal Medicine

## 2017-12-10 DIAGNOSIS — L97513 Non-pressure chronic ulcer of other part of right foot with necrosis of muscle: Secondary | ICD-10-CM | POA: Diagnosis not present

## 2017-12-10 DIAGNOSIS — M79604 Pain in right leg: Secondary | ICD-10-CM | POA: Diagnosis not present

## 2017-12-10 DIAGNOSIS — I350 Nonrheumatic aortic (valve) stenosis: Secondary | ICD-10-CM | POA: Diagnosis not present

## 2017-12-10 DIAGNOSIS — E7849 Other hyperlipidemia: Secondary | ICD-10-CM | POA: Diagnosis not present

## 2017-12-10 DIAGNOSIS — Z794 Long term (current) use of insulin: Secondary | ICD-10-CM | POA: Diagnosis not present

## 2017-12-10 DIAGNOSIS — E11621 Type 2 diabetes mellitus with foot ulcer: Secondary | ICD-10-CM | POA: Diagnosis not present

## 2017-12-10 DIAGNOSIS — I2510 Atherosclerotic heart disease of native coronary artery without angina pectoris: Secondary | ICD-10-CM | POA: Diagnosis not present

## 2017-12-10 DIAGNOSIS — Z951 Presence of aortocoronary bypass graft: Secondary | ICD-10-CM | POA: Diagnosis not present

## 2017-12-10 DIAGNOSIS — R600 Localized edema: Secondary | ICD-10-CM | POA: Diagnosis not present

## 2017-12-10 DIAGNOSIS — I5031 Acute diastolic (congestive) heart failure: Secondary | ICD-10-CM | POA: Diagnosis not present

## 2017-12-10 DIAGNOSIS — I701 Atherosclerosis of renal artery: Secondary | ICD-10-CM | POA: Diagnosis not present

## 2017-12-10 DIAGNOSIS — I70203 Unspecified atherosclerosis of native arteries of extremities, bilateral legs: Secondary | ICD-10-CM | POA: Diagnosis not present

## 2017-12-10 DIAGNOSIS — I10 Essential (primary) hypertension: Secondary | ICD-10-CM | POA: Diagnosis not present

## 2017-12-10 DIAGNOSIS — I739 Peripheral vascular disease, unspecified: Secondary | ICD-10-CM | POA: Diagnosis not present

## 2017-12-10 DIAGNOSIS — R011 Cardiac murmur, unspecified: Secondary | ICD-10-CM | POA: Diagnosis not present

## 2017-12-10 DIAGNOSIS — E1142 Type 2 diabetes mellitus with diabetic polyneuropathy: Secondary | ICD-10-CM | POA: Diagnosis not present

## 2017-12-10 MED ORDER — AMITRIPTYLINE HCL 25 MG PO TABS: 25.0000 mg | ORAL_TABLET | Freq: Every day | ORAL | 1 refills | Status: DC

## 2017-12-10 NOTE — Progress Notes (Signed)
ipt

## 2017-12-11 ENCOUNTER — Encounter: Payer: Medicare Other | Attending: Physician Assistant | Admitting: Physician Assistant

## 2017-12-11 DIAGNOSIS — E1140 Type 2 diabetes mellitus with diabetic neuropathy, unspecified: Secondary | ICD-10-CM | POA: Diagnosis not present

## 2017-12-11 DIAGNOSIS — L97512 Non-pressure chronic ulcer of other part of right foot with fat layer exposed: Secondary | ICD-10-CM | POA: Diagnosis not present

## 2017-12-11 DIAGNOSIS — E11621 Type 2 diabetes mellitus with foot ulcer: Secondary | ICD-10-CM | POA: Diagnosis not present

## 2017-12-11 DIAGNOSIS — L97529 Non-pressure chronic ulcer of other part of left foot with unspecified severity: Secondary | ICD-10-CM | POA: Diagnosis not present

## 2017-12-11 DIAGNOSIS — I132 Hypertensive heart and chronic kidney disease with heart failure and with stage 5 chronic kidney disease, or end stage renal disease: Secondary | ICD-10-CM | POA: Insufficient documentation

## 2017-12-11 DIAGNOSIS — E1122 Type 2 diabetes mellitus with diabetic chronic kidney disease: Secondary | ICD-10-CM | POA: Diagnosis not present

## 2017-12-11 DIAGNOSIS — N186 End stage renal disease: Secondary | ICD-10-CM | POA: Insufficient documentation

## 2017-12-11 DIAGNOSIS — L97526 Non-pressure chronic ulcer of other part of left foot with bone involvement without evidence of necrosis: Secondary | ICD-10-CM | POA: Insufficient documentation

## 2017-12-11 DIAGNOSIS — I5022 Chronic systolic (congestive) heart failure: Secondary | ICD-10-CM | POA: Insufficient documentation

## 2017-12-11 DIAGNOSIS — L97522 Non-pressure chronic ulcer of other part of left foot with fat layer exposed: Secondary | ICD-10-CM | POA: Diagnosis not present

## 2017-12-12 DIAGNOSIS — M79604 Pain in right leg: Secondary | ICD-10-CM | POA: Diagnosis not present

## 2017-12-12 DIAGNOSIS — Z794 Long term (current) use of insulin: Secondary | ICD-10-CM | POA: Diagnosis not present

## 2017-12-12 DIAGNOSIS — I10 Essential (primary) hypertension: Secondary | ICD-10-CM | POA: Diagnosis not present

## 2017-12-12 DIAGNOSIS — I70203 Unspecified atherosclerosis of native arteries of extremities, bilateral legs: Secondary | ICD-10-CM | POA: Diagnosis not present

## 2017-12-12 DIAGNOSIS — E11621 Type 2 diabetes mellitus with foot ulcer: Secondary | ICD-10-CM | POA: Diagnosis not present

## 2017-12-12 DIAGNOSIS — L97513 Non-pressure chronic ulcer of other part of right foot with necrosis of muscle: Secondary | ICD-10-CM | POA: Diagnosis not present

## 2017-12-13 NOTE — Progress Notes (Signed)
[name], [name] ([id number]) Visit Report for 12/11/2017 Arrival Information Details Patient Name: [name], [name]. Date of Service: 12/11/2017 11:15 AM Medical Record Number: [id number] Patient Account Number: [id number] Date of Birth/Sex: [birth year] (84 y.o. Male) Treating RN: Montey Hora Primary Care Kameela Leipold: Deborra Medina Other Clinician: Referring Malori Myers: Deborra Medina Treating Alexis Reber/Extender: Melburn Hake, HOYT Weeks in Treatment: 8 Visit Information History Since Last Visit Added or deleted any medications: Yes Patient Arrived: Walker Any new allergies or adverse reactions: No Arrival Time: 11:15 Had a fall or experienced change in No Accompanied By: self activities of daily living that may affect Transfer Assistance: None risk of falls: Patient Identification Verified: Yes Signs or symptoms of abuse/neglect since last visito No Secondary Verification Process Completed: Yes Hospitalized since last visit: No Implantable device outside of the clinic excluding No cellular tissue based products placed in the center since last visit: Has Dressing in Place as Prescribed: Yes Pain Present Now: No Electronic Signature(s) Signed: 12/11/2017 4:25:22 PM By: Lorine Bears RCP, RRT, CHT Entered By: Becky Sax, Amado Nash on 12/11/2017 11:16:31 [name], [name] ([id number]) -------------------------------------------------------------------------------- Clinic Level of Care Assessment Details Patient Name: [name], [name]. Date of Service: 12/11/2017 11:15 AM Medical Record Number: [id number] Patient Account Number: [id number] Date of Birth/Sex: 08-17-1924 (84 y.o. Male) Treating RN: Montey Hora Primary Care Adiah Guereca: Deborra Medina Other Clinician: Referring Edra Riccardi: Deborra Medina Treating Zaviyar Rahal/Extender: Melburn Hake, HOYT Weeks in Treatment: 8 Clinic Level of Care Assessment Items TOOL 4 Quantity Score []  - Use when only an EandM is performed  on FOLLOW-UP visit 0 ASSESSMENTS - Nursing Assessment / Reassessment X - Reassessment of Co-morbidities (includes updates in patient status) 1 10 X- 1 5 Reassessment of Adherence to Treatment Plan ASSESSMENTS - Wound and Skin Assessment / Reassessment []  - Simple Wound Assessment / Reassessment - one wound 0 X- 4 5 Complex Wound Assessment / Reassessment - multiple wounds []  - 0 Dermatologic / Skin Assessment (not related to wound area) ASSESSMENTS - Focused Assessment X - Circumferential Edema Measurements - multi extremities 2 5 []  - 0 Nutritional Assessment / Counseling / Intervention X- 1 5 Lower Extremity Assessment (monofilament, tuning fork, pulses) X- 1 10 Peripheral Arterial Disease Assessment (using hand held doppler) ASSESSMENTS - Ostomy and/or Continence Assessment and Care []  - Incontinence Assessment and Management 0 []  - 0 Ostomy Care Assessment and Management (repouching, etc.) PROCESS - Coordination of Care X - Simple Patient / Family Education for ongoing care 1 15 []  - 0 Complex (extensive) Patient / Family Education for ongoing care []  - 0 Staff obtains Programmer, systems, Records, Test Results / Process Orders []  - 0 Staff telephones HHA, Nursing Homes / Clarify orders / etc []  - 0 Routine Transfer to another Facility (non-emergent condition) []  - 0 Routine Hospital Admission (non-emergent condition) []  - 0 New Admissions / Biomedical engineer / Ordering NPWT, Apligraf, etc. []  - 0 Emergency Hospital Admission (emergent condition) X- 1 10 Simple Discharge Coordination Mcvicar, Deloy S. ([id number]) []  - 0 Complex (extensive) Discharge Coordination PROCESS - Special Needs []  - Pediatric / Minor Patient Management 0 []  - 0 Isolation Patient Management []  - 0 Hearing / Language / Visual special needs []  - 0 Assessment of Community assistance (transportation, D/C planning, etc.) []  - 0 Additional assistance / Altered mentation []  - 0 Support  Surface(s) Assessment (bed, cushion, seat, etc.) INTERVENTIONS - Wound Cleansing / Measurement []  - Simple Wound Cleansing - one wound 0 X- 4 5 Complex Wound Cleansing - multiple wounds X-  1 5 Wound Imaging (photographs - any number of wounds) []  - 0 Wound Tracing (instead of photographs) []  - 0 Simple Wound Measurement - one wound X- 4 5 Complex Wound Measurement - multiple wounds INTERVENTIONS - Wound Dressings X - Small Wound Dressing one or multiple wounds 4 10 []  - 0 Medium Wound Dressing one or multiple wounds []  - 0 Large Wound Dressing one or multiple wounds []  - 0 Application of Medications - topical []  - 0 Application of Medications - injection INTERVENTIONS - Miscellaneous []  - External ear exam 0 []  - 0 Specimen Collection (cultures, biopsies, blood, body fluids, etc.) []  - 0 Specimen(s) / Culture(s) sent or taken to Lab for analysis []  - 0 Patient Transfer (multiple staff / Civil Service fast streamer / Similar devices) []  - 0 Simple Staple / Suture removal (25 or less) []  - 0 Complex Staple / Suture removal (26 or more) []  - 0 Hypo / Hyperglycemic Management (close monitor of Blood Glucose) []  - 0 Ankle / Brachial Index (ABI) - do not check if billed separately X- 1 5 Vital Signs Sivils, Dhani S. ([id number]) Has the patient been seen at the hospital within the last three years: Yes Total Score: 175 Level Of Care: ____ Electronic Signature(s) Signed: 12/11/2017 5:01:53 PM By: Montey Hora Entered By: Montey Hora on 12/11/2017 12:41:55 [name], [name] ([id number]) -------------------------------------------------------------------------------- Encounter Discharge Information Details Patient Name: [name], [name] Date of Service: 12/11/2017 11:15 AM Medical Record Number: [id number] Patient Account Number: [id number] Date of Birth/Sex: March 02, 1924 (84 y.o. Male) Treating RN: Montey Hora Primary Care Chiara Coltrin: Deborra Medina Other Clinician: Referring  Shaianne Nucci: Deborra Medina Treating Sama Arauz/Extender: Melburn Hake, HOYT Weeks in Treatment: 8 Encounter Discharge Information Items Discharge Condition: Stable Ambulatory Status: Walker Discharge Destination: Home Transportation: Private Auto Accompanied By: self Schedule Follow-up Appointment: Yes Clinical Summary of Care: Electronic Signature(s) Signed: 12/11/2017 12:45:02 PM By: Montey Hora Entered By: Montey Hora on 12/11/2017 12:45:02 [name], [name] ([id number]) -------------------------------------------------------------------------------- Lower Extremity Assessment Details Patient Name: [name], [name] Date of Service: 12/11/2017 11:15 AM Medical Record Number: [id number] Patient Account Number: [id number] Date of Birth/Sex: [birth year] (84 y.o. Male) Treating RN: Cornell Barman Primary Care Ceola Para: Deborra Medina Other Clinician: Referring Ankith Edmonston: Deborra Medina Treating Jerrica Thorman/Extender: Melburn Hake, HOYT Weeks in Treatment: 8 Edema Assessment Assessed: [Left: No] [Right: No] Edema: [Left: Yes] [Right: Yes] Calf Left: Right: Point of Measurement: 30 cm From Medial Instep 36.2 cm 37.5 cm Ankle Left: Right: Point of Measurement: 10 cm From Medial Instep 26.2 cm 25.6 cm Vascular Assessment Pulses: Dorsalis Pedis Palpable: [Left:No] [Right:No] Doppler Audible: [Left:Yes] [Right:Yes] Posterior Tibial Extremity colors, hair growth, and conditions: Extremity Color: [Left:Normal] [Right:Normal] Hair Growth on Extremity: [Left:No] [Right:No] Temperature of Extremity: [Left:Warm] [Right:Warm] Capillary Refill: [Left:< 3 seconds] [Right:< 3 seconds] Toe Nail Assessment Left: Right: Thick: Yes Yes Discolored: Yes Yes Deformed: Yes Yes Improper Length and Hygiene: No No Electronic Signature(s) Signed: 12/11/2017 5:47:28 PM By: Gretta Cool, BSN, RN, CWS, Kim RN, BSN Entered By: Gretta Cool, BSN, RN, CWS, Kim on 12/11/2017 11:45:10 [name], [name]  ([id number]) -------------------------------------------------------------------------------- Multi Wound Chart Details Patient Name: [name], [name] Date of Service: 12/11/2017 11:15 AM Medical Record Number: [id number] Patient Account Number: [id number] Date of Birth/Sex: [birth year] (84 y.o. Male) Treating RN: Montey Hora Primary Care Aoife Bold: Deborra Medina Other Clinician: Referring Melquan Ernsberger: Deborra Medina Treating Carrieanne Kleen/Extender: STONE III, HOYT Weeks in Treatment: 8 Vital Signs Height(in): 68 Pulse(bpm): 72 Weight(lbs): 180 Blood Pressure(mmHg): 137/51 Body Mass Index(BMI): 27 Temperature(F): 97.5 Respiratory Rate 16 (breaths/min):  Photos: [1:No Photos] [3:No Photos] [4:No Photos] Wound Location: [1:Right Toe Second] [3:Left Toe Fifth - Lateral] [4:Left Foot - Dorsal, Medial] Wounding Event: [1:Gradually Appeared] [3:Not Known] [4:Trauma] Primary Etiology: [1:Diabetic Wound/Ulcer of the Lower Extremity] [3:Diabetic Wound/Ulcer of the Lower Extremity] [4:Diabetic Wound/Ulcer of the Lower Extremity] Secondary Etiology: [1:Arterial Insufficiency Ulcer] [3:Arterial Insufficiency Ulcer] [4:Arterial Insufficiency Ulcer] Comorbid History: [1:N/A] [3:Cataracts, Lymphedema, Coronary Artery Disease, Hypertension, Peripheral Venous Disease, Type II Diabetes, End Stage Renal Disease, Rheumatoid Arthritis, Neuropathy] [4:Cataracts, Lymphedema, Coronary Artery Disease,  Hypertension, Peripheral Venous Disease, Type II Diabetes, End Stage Renal Disease, Rheumatoid Arthritis, Neuropathy] Date Acquired: [1:06/04/2017] [3:08/05/2017] [4:11/20/2017] Weeks of Treatment: [1:8] [3:2] [4:2] Wound Status: [1:Open] [3:Open] [4:Open] Pending Amputation on [1:Yes] [3:No] [4:No] Presentation: Measurements L x W x D [1:0.9x1x0.5] [3:1x1.5x0.1] [4:1x1.2x0.2] (cm) Area (cm) : [1:0.707] [3:1.178] [4:0.942] Volume (cm) : [1:0.353] [3:0.118] [4:0.188] % Reduction in Area: [1:40.00%] [3:-150.10%]  [4:-71.30%] % Reduction in Volume: [1:-49.60%] [3:-151.10%] [4:-70.90%] Classification: [1:Grade 2] [3:Grade 2] [4:Grade 2] Exudate Amount: [1:N/A] [3:Medium] [4:Medium] Exudate Type: [1:N/A] [3:Serous] [4:Serous] Exudate Color: [1:N/A] [3:amber] [4:amber] Wound Margin: [1:N/A] [3:Flat and Intact] [4:Flat and Intact] Granulation Amount: [1:N/A] [3:Large (67-100%)] [4:None Present (0%)] Granulation Quality: [1:N/A] [3:Red] [4:N/A] Necrotic Amount: [1:N/A] [3:Small (1-33%)] [4:Large (67-100%)] Necrotic Tissue: [1:N/A] [3:Adherent Slough] [4:Eschar, Adherent Slough] Epithelialization: [1:N/A] [3:None] [4:None] Periwound Skin Texture: [1:No Abnormalities Noted] [3:No Abnormalities Noted] [4:Excoriation: No Induration: No Callus: No] Crepitus: No Rash: No Scarring: No Periwound Skin Moisture: No Abnormalities Noted Maceration: No Maceration: No Dry/Scaly: No Dry/Scaly: No Periwound Skin Color: No Abnormalities Noted No Abnormalities Noted Atrophie Blanche: No Cyanosis: No Ecchymosis: No Erythema: No Hemosiderin Staining: No Mottled: No Pallor: No Rubor: No Temperature: N/A No Abnormality No Abnormality Tenderness on Palpation: No Yes No Wound Preparation: N/A Ulcer Cleansing: Ulcer Cleansing: Rinsed/Irrigated with Saline Rinsed/Irrigated with Saline Topical Anesthetic Applied: Topical Anesthetic Applied: Other: lidocaine 4% None Wound Number: 5 N/A N/A Photos: No Photos N/A N/A Wound Location: Left Toe Third N/A N/A Wounding Event: Surgical Injury N/A N/A Primary Etiology: Arterial Insufficiency Ulcer N/A N/A Secondary Etiology: N/A N/A N/A Comorbid History: Cataracts, Lymphedema, N/A N/A Coronary Artery Disease, Hypertension, Peripheral Venous Disease, Type II Diabetes, End Stage Renal Disease, Rheumatoid Arthritis, Neuropathy Date Acquired: 11/18/2017 N/A N/A Weeks of Treatment: 0 N/A N/A Wound Status: Open N/A N/A Pending Amputation on No N/A  N/A Presentation: Measurements L x W x D 2.5x1.2x0.8 N/A N/A (cm) Area (cm) : 2.356 N/A N/A Volume (cm) : 1.885 N/A N/A % Reduction in Area: 0.00% N/A N/A % Reduction in Volume: 0.00% N/A N/A Classification: Unclassifiable N/A N/A Exudate Amount: Large N/A N/A Exudate Type: Serous N/A N/A Exudate Color: amber N/A N/A Wound Margin: Indistinct, nonvisible N/A N/A Granulation Amount: None Present (0%) N/A N/A Granulation Quality: N/A N/A N/A Necrotic Amount: Large (67-100%) N/A N/A Necrotic Tissue: Adherent Slough N/A N/A Exposed Structures: Fascia: No N/A N/A Fat Layer (Subcutaneous Tissue) Exposed: No Nest, Mukund S. ([id number]) Tendon: No Muscle: No Joint: No Bone: No Epithelialization: None N/A N/A Periwound Skin Texture: Excoriation: No N/A N/A Induration: No Callus: No Crepitus: No Rash: No Scarring: No Periwound Skin Moisture: Maceration: No N/A N/A Dry/Scaly: No Periwound Skin Color: Erythema: Yes N/A N/A Atrophie Blanche: No Cyanosis: No Ecchymosis: No Hemosiderin Staining: No Mottled: No Pallor: No Rubor: No Temperature: N/A N/A N/A Tenderness on Palpation: No N/A N/A Wound Preparation: Ulcer Cleansing: N/A N/A Rinsed/Irrigated with Saline Topical Anesthetic Applied: None Treatment Notes Electronic Signature(s) Signed: 12/11/2017 5:01:53 PM By: Montey Hora  Entered By: Montey Hora on 12/11/2017 11:51:35 Baldridge, [name] ([id number]) -------------------------------------------------------------------------------- [place] Details Patient Name: Bricco, IBN STIEF. Date of Service: 12/11/2017 11:15 AM Medical Record Number: [id number] Patient Account Number: [id number] Date of Birth/Sex: [birth year] (84 y.o. Male) Treating RN: Montey Hora Primary Care Artemus Romanoff: Deborra Medina Other Clinician: Referring Loy Mccartt: Deborra Medina Treating Yaslyn Cumby/Extender: Melburn Hake, HOYT Weeks in Treatment: 8 Active Inactive ` Abuse  / Safety / Falls / Self Care Management Nursing Diagnoses: Potential for falls Goals: Patient will remain injury free related to falls Date Initiated: 10/10/2017 Target Resolution Date: 01/02/2018 Goal Status: Active Interventions: Assess fall risk on admission and as needed Notes: ` Orientation to the Wound Care Program Nursing Diagnoses: Knowledge deficit related to the wound healing center program Goals: Patient/caregiver will verbalize understanding of the [place] Program Date Initiated: 10/10/2017 Target Resolution Date: 01/02/2018 Goal Status: Active Interventions: Provide education on orientation to the wound center Notes: ` Pain, Acute or Chronic Nursing Diagnoses: Potential alteration in comfort, pain Goals: Patient/caregiver will verbalize adequate pain control between visits Date Initiated: 10/10/2017 Target Resolution Date: 01/02/2018 Goal Status: Active Interventions: Roggenkamp, CATARINO VOLD ([id number]) Complete pain assessment as per visit requirements Notes: ` Wound/Skin Impairment Nursing Diagnoses: Impaired tissue integrity Goals: Ulcer/skin breakdown will heal within 14 weeks Date Initiated: 10/10/2017 Target Resolution Date: 01/02/2018 Goal Status: Active Interventions: Assess patient/caregiver ability to obtain necessary supplies Assess patient/caregiver ability to perform ulcer/skin care regimen upon admission and as needed Assess ulceration(s) every visit Notes: Electronic Signature(s) Signed: 12/11/2017 5:01:53 PM By: Montey Hora Entered By: Montey Hora on 12/11/2017 11:51:28 [place], [name] ([id number]) -------------------------------------------------------------------------------- Pain Assessment Details Patient Name: [name], [name] Date of Service: 12/11/2017 11:15 AM Medical Record Number: [id number] Patient Account Number: [id number] Date of Birth/Sex: 1924/03/22 (84 y.o. Male) Treating RN: Montey Hora Primary Care  Clemma Johnsen: Deborra Medina Other Clinician: Referring Marisella Puccio: Deborra Medina Treating Adelie Croswell/Extender: Melburn Hake, HOYT Weeks in Treatment: 8 Active Problems Location of Pain Severity and Description of Pain Patient Has Paino No Site Locations Pain Management and Medication Current Pain Management: Electronic Signature(s) Signed: 12/11/2017 4:25:22 PM By: Lorine Bears RCP, RRT, CHT Signed: 12/11/2017 5:01:53 PM By: Montey Hora Entered By: Lorine Bears on 12/11/2017 11:16:37 [name], [name] ([id number]) -------------------------------------------------------------------------------- Patient/Caregiver Education Details Patient Name: [name], [name] Date of Service: 12/11/2017 11:15 AM Medical Record Number: [id number] Patient Account Number: [id number] Date of Birth/Gender: [birth year] (84 y.o. Male) Treating RN: Montey Hora Primary Care Physician: Deborra Medina Other Clinician: Referring Physician: Deborra Medina Treating Physician/Extender: Melburn Hake, HOYT Weeks in Treatment: 8 Education Assessment Education Provided To: Patient Education Topics Provided Wound/Skin Impairment: Handouts: Other: wound care as ordered Methods: Explain/Verbal Responses: State content correctly Electronic Signature(s) Signed: 12/11/2017 5:01:53 PM By: Montey Hora Entered By: Montey Hora on 12/11/2017 12:42:41 [name], [name] ([id number]) -------------------------------------------------------------------------------- Wound Assessment Details Patient Name: Harron, Lonn S. Date of Service: 12/11/2017 11:15 AM Medical Record Number: [id number] Patient Account Number: [id number] Date of Birth/Sex: [birth year] (84 y.o. Male) Treating RN: Cornell Barman Primary Care Monnie Anspach: Deborra Medina Other Clinician: Referring Juliann Olesky: Deborra Medina Treating Akashdeep Chuba/Extender: STONE III, HOYT Weeks in Treatment: 8 Wound Status Wound Number: 1 Primary Etiology:  Diabetic Wound/Ulcer of the Lower Extremity Wound Location: Right Toe Second Secondary Arterial Insufficiency Ulcer Wounding Event: Gradually Appeared Etiology: Date Acquired: 06/04/2017 Wound Status: Open Weeks Of Treatment: 8 Clustered Wound: No Pending Amputation On Presentation Photos Photo Uploaded By: Gretta Cool BSN, RN, CWS, Kim on 12/11/2017 17:22:47 Wound Measurements Length: (cm)  0.9 Width: (cm) 1 Depth: (cm) 0.5 Area: (cm) 0.707 Volume: (cm) 0.353 % Reduction in Area: 40% % Reduction in Volume: -49.6% Wound Description Classification: Grade 2 Periwound Skin Texture Texture Color No Abnormalities Noted: No No Abnormalities Noted: No Moisture No Abnormalities Noted: No Treatment Notes Wound #1 (Right Toe Second) Notes Santyl on left dorsal foot and amp site; silvercel on all wounds, in between toes gauze/conform Electronic Signature(s) AMANDA, POTE ([id number]) Signed: 12/11/2017 5:47:28 PM By: Gretta Cool, BSN, RN, CWS, Kim RN, BSN Entered By: Gretta Cool, BSN, RN, CWS, Kim on 12/11/2017 11:34:50 Martone, [name] ([id number]) -------------------------------------------------------------------------------- Wound Assessment Details Patient Name: Earnest, Rory S. Date of Service: 12/11/2017 11:15 AM Medical Record Number: [id number] Patient Account Number: [id number] Date of Birth/Sex: 03/31/1924 (84 y.o. Male) Treating RN: Cornell Barman Primary Care Tyquan Carmickle: Deborra Medina Other Clinician: Referring Shabana Armentrout: Deborra Medina Treating Suresh Audi/Extender: STONE III, HOYT Weeks in Treatment: 8 Wound Status Wound Number: 3 Primary Diabetic Wound/Ulcer of the Lower Extremity Etiology: Wound Location: Left Toe Fifth - Lateral Secondary Arterial Insufficiency Ulcer Wounding Event: Not Known Etiology: Date Acquired: 08/05/2017 Wound Open Weeks Of Treatment: 2 Status: Clustered Wound: No Comorbid Cataracts, Lymphedema, Coronary Artery History: Disease, Hypertension,  Peripheral Venous Disease, Type II Diabetes, End Stage Renal Disease, Rheumatoid Arthritis, Neuropathy Photos Photo Uploaded By: Gretta Cool, BSN, RN, CWS, Kim on 12/11/2017 17:22:48 Wound Measurements Length: (cm) 1 % Reducti Width: (cm) 1.5 % Reducti Depth: (cm) 0.1 Epithelia Area: (cm) 1.178 Tunnelin Volume: (cm) 0.118 Undermin on in Area: -150.1% on in Volume: -151.1% lization: None g: No ing: No Wound Description Classification: Grade 2 Foul Odor Wound Margin: Flat and Intact Slough/Fi Exudate Amount: Medium Exudate Type: Serous Exudate Color: amber After Cleansing: No brino Yes Wound Bed Granulation Amount: Large (67-100%) Exposed Structure Granulation Quality: Red Fascia Exposed: No Necrotic Amount: Small (1-33%) Fat Layer (Subcutaneous Tissue) Exposed: Yes Necrotic Quality: Adherent Slough Tendon Exposed: No Muscle Exposed: No Joint Exposed: No Bisceglia, Jamarie S. ([id number]) Bone Exposed: No Periwound Skin Texture Texture Color No Abnormalities Noted: Yes No Abnormalities Noted: Yes Moisture Temperature / Pain No Abnormalities Noted: No Temperature: No Abnormality Dry / Scaly: No Tenderness on Palpation: Yes Maceration: No Wound Preparation Ulcer Cleansing: Rinsed/Irrigated with Saline Topical Anesthetic Applied: Other: lidocaine 4%, Treatment Notes Wound #3 (Left, Lateral Toe Fifth) Notes Santyl on left dorsal foot and amp site; silvercel on all wounds, in between toes gauze/conform Electronic Signature(s) Signed: 12/11/2017 5:47:28 PM By: Gretta Cool, BSN, RN, CWS, Kim RN, BSN Entered By: Gretta Cool, BSN, RN, CWS, Kim on 12/11/2017 11:36:19 Alderman, [name] ([id number]) -------------------------------------------------------------------------------- Wound Assessment Details Patient Name: Mallek, Zarian S. Date of Service: 12/11/2017 11:15 AM Medical Record Number: [id number] Patient Account Number: [id number] Date of Birth/Sex: 1924-05-04 (84 y.o.  Male) Treating RN: Cornell Barman Primary Care Diamond Martucci: Deborra Medina Other Clinician: Referring Meerab Maselli: Deborra Medina Treating Yunis Voorheis/Extender: STONE III, HOYT Weeks in Treatment: 8 Wound Status Wound Number: 4 Primary Diabetic Wound/Ulcer of the Lower Extremity Etiology: Wound Location: Left Foot - Dorsal, Medial Secondary Arterial Insufficiency Ulcer Wounding Event: Trauma Etiology: Date Acquired: 11/20/2017 Wound Open Weeks Of Treatment: 2 Status: Clustered Wound: No Comorbid Cataracts, Lymphedema, Coronary Artery History: Disease, Hypertension, Peripheral Venous Disease, Type II Diabetes, End Stage Renal Disease, Rheumatoid Arthritis, Neuropathy Photos Photo Uploaded By: Gretta Cool, BSN, RN, CWS, Kim on 12/11/2017 17:24:02 Wound Measurements Length: (cm) 1 Width: (cm) 1.2 Depth: (cm) 0.2 Area: (cm) 0.942 Volume: (cm) 0.188 % Reduction in Area: -71.3% % Reduction in Volume: -70.9% Epithelialization: None Tunneling:  No Undermining: No Wound Description Classification: Grade 2 Foul Odor Wound Margin: Flat and Intact Slough/Fi Exudate Amount: Medium Exudate Type: Serous Exudate Color: amber After Cleansing: No brino Yes Wound Bed Granulation Amount: None Present (0%) Exposed Structure Necrotic Amount: Large (67-100%) Fascia Exposed: No Necrotic Quality: Eschar, Adherent Slough Fat Layer (Subcutaneous Tissue) Exposed: Yes Tendon Exposed: No Muscle Exposed: No Joint Exposed: No Crislip, Brewer S. ([id number]) Bone Exposed: No Periwound Skin Texture Texture Color No Abnormalities Noted: No No Abnormalities Noted: No Callus: No Atrophie Blanche: No Crepitus: No Cyanosis: No Excoriation: No Ecchymosis: No Induration: No Erythema: No Rash: No Hemosiderin Staining: No Scarring: No Mottled: No Pallor: No Moisture Rubor: No No Abnormalities Noted: No Dry / Scaly: No Temperature / Pain Maceration: No Temperature: No Abnormality Wound  Preparation Ulcer Cleansing: Rinsed/Irrigated with Saline Topical Anesthetic Applied: None Treatment Notes Wound #4 (Left, Medial, Dorsal Foot) Notes Santyl on left dorsal foot and amp site; silvercel on all wounds, in between toes gauze/conform Electronic Signature(s) Signed: 12/11/2017 5:47:28 PM By: Gretta Cool, BSN, RN, CWS, Kim RN, BSN Entered By: Gretta Cool, BSN, RN, CWS, Kim on 12/11/2017 11:37:21 Gonzales, [name] ([id number]) -------------------------------------------------------------------------------- Wound Assessment Details Patient Name: Monaco, Jaquan S. Date of Service: 12/11/2017 11:15 AM Medical Record Number: [id number] Patient Account Number: [id number] Date of Birth/Sex: 1924-11-24 (84 y.o. Male) Treating RN: Cornell Barman Primary Care Dayanna Pryce: Deborra Medina Other Clinician: Referring Bauer Ausborn: Deborra Medina Treating Sharrie Self/Extender: Melburn Hake, HOYT Weeks in Treatment: 8 Wound Status Wound Number: 5 Primary Arterial Insufficiency Ulcer Etiology: Wound Location: Left Toe Third Wound Open Wounding Event: Surgical Injury Status: Date Acquired: 11/18/2017 Comorbid Cataracts, Lymphedema, Coronary Artery Weeks Of Treatment: 0 History: Disease, Hypertension, Peripheral Venous Clustered Wound: No Disease, Type II Diabetes, End Stage Renal Disease, Rheumatoid Arthritis, Neuropathy Photos Photo Uploaded By: Gretta Cool, BSN, RN, CWS, Kim on 12/11/2017 17:24:03 Wound Measurements Length: (cm) 2.5 Width: (cm) 1.2 Depth: (cm) 0.8 Area: (cm) 2.356 Volume: (cm) 1.885 % Reduction in Area: 0% % Reduction in Volume: 0% Epithelialization: None Tunneling: No Undermining: No Wound Description Classification: Unclassifiable Foul Odor Wound Margin: Indistinct, nonvisible Slough/Fi Exudate Amount: Large Exudate Type: Serous Exudate Color: amber After Cleansing: No brino Yes Wound Bed Granulation Amount: None Present (0%) Exposed Structure Necrotic Amount: Large  (67-100%) Fascia Exposed: No Necrotic Quality: Adherent Slough Fat Layer (Subcutaneous Tissue) Exposed: No Tendon Exposed: No Muscle Exposed: No Joint Exposed: No Bone Exposed: No Plouff, Dax S. ([id number]) Periwound Skin Texture Texture Color No Abnormalities Noted: No No Abnormalities Noted: No Callus: No Atrophie Blanche: No Crepitus: No Cyanosis: No Excoriation: No Ecchymosis: No Induration: No Erythema: Yes Rash: No Hemosiderin Staining: No Scarring: No Mottled: No Pallor: No Moisture Rubor: No No Abnormalities Noted: No Dry / Scaly: No Maceration: No Wound Preparation Ulcer Cleansing: Rinsed/Irrigated with Saline Topical Anesthetic Applied: None Treatment Notes Wound #5 (Left Toe Third) Notes Santyl on left dorsal foot and amp site; silvercel on all wounds, in between toes gauze/conform Electronic Signature(s) Signed: 12/11/2017 5:47:28 PM By: Gretta Cool, BSN, RN, CWS, Kim RN, BSN Entered By: Gretta Cool, BSN, RN, CWS, Kim on 12/11/2017 11:39:19 Hendricksen, [name] ([id number]) -------------------------------------------------------------------------------- Vitals Details Patient Name: Ohlinger, Anant S. Date of Service: 12/11/2017 11:15 AM Medical Record Number: [id number] Patient Account Number: [id number] Date of Birth/Sex: [birth year] (84 y.o. Male) Treating RN: Montey Hora Primary Care Orman Matsumura: Deborra Medina Other Clinician: Referring Manika Hast: Deborra Medina Treating Oreatha Fabry/Extender: STONE III, HOYT Weeks in Treatment: 8 Vital Signs Time Taken: 11:16 Temperature (F): 97.5 Height (in): 68 Pulse (  bpm): 72 Weight (lbs): 180 Respiratory Rate (breaths/min): 16 Body Mass Index (BMI): 27.4 Blood Pressure (mmHg): 137/51 Reference Range: 80 - 120 mg / dl Electronic Signature(s) Signed: 12/11/2017 4:25:22 PM By: Lorine Bears RCP, RRT, CHT Entered By: Becky Sax, Amado Nash on 12/11/2017 11:20:05

## 2017-12-14 DIAGNOSIS — I10 Essential (primary) hypertension: Secondary | ICD-10-CM | POA: Diagnosis not present

## 2017-12-14 DIAGNOSIS — E1152 Type 2 diabetes mellitus with diabetic peripheral angiopathy with gangrene: Secondary | ICD-10-CM | POA: Diagnosis not present

## 2017-12-14 DIAGNOSIS — E11621 Type 2 diabetes mellitus with foot ulcer: Secondary | ICD-10-CM | POA: Diagnosis not present

## 2017-12-14 DIAGNOSIS — L97513 Non-pressure chronic ulcer of other part of right foot with necrosis of muscle: Secondary | ICD-10-CM | POA: Diagnosis not present

## 2017-12-14 DIAGNOSIS — I70203 Unspecified atherosclerosis of native arteries of extremities, bilateral legs: Secondary | ICD-10-CM | POA: Diagnosis not present

## 2017-12-14 DIAGNOSIS — Z794 Long term (current) use of insulin: Secondary | ICD-10-CM | POA: Diagnosis not present

## 2017-12-14 NOTE — Progress Notes (Signed)
Patient's Name: MAXEMILIANO RIEL  MRN: [id number]  Referring Provider: Jannifer Franklin, NP  DOB: 09/03/1924  PCP: Crecencio Mc, MD  DOS: 12/15/2017  Note by: Gaspar Cola, MD  Service setting: Ambulatory outpatient  Specialty: Interventional Pain Management  Location: [facility] (AMB) Pain Management Facility  Visit type: Initial Patient Evaluation  Patient type: New Patient   Primary Reason(s) for Visit: Encounter for initial evaluation of one or more chronic problems (new to examiner) potentially causing chronic pain, and posing a threat to normal musculoskeletal function. (Level of risk: High) CC: Foot Pain (bilateral)  HPI  Mr. Yoshino is a 84 y.o. year old, male patient, who comes today to see [place] for the first time for an initial evaluation of his chronic pain. 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 ([place]); Diabetes mellitus with peripheral vascular disease ([place]); Skin lesion of scalp; Insomnia; Overweight (BMI 25.0-29.9); Do not resuscitate discussion; Edema; CKD (chronic kidney disease) stage 3, GFR 30-59 ml/min ([place]); Undiagnosed cardiac murmurs; Grief reaction; Diabetes mellitus with chronic kidney disease ([place]); Pain in thumb joint with movement of right hand; Aortic stenosis; Atherosclerosis of native arteries of the extremities with ulceration ([place]); Chronic lower extremity pain (Primary Area of Pain) (Bilateral) (L>R); Cellulitis of left foot; Hospital discharge follow-up; Diabetic peripheral neuropathy ([place]); 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 Today he comes in for evaluation of his Foot Pain (bilateral)  Pain Assessment: Location: Left, Right Foot Radiating: denies Onset: More than a month ago Duration: Chronic pain Quality: Sharp(twitching) Severity: 1 /10 (subjective, self-reported pain score)  Note: Reported level is compatible with observation.                               Effect on ADL: Limits activities Timing: Intermittent Modifying factors: nothing BP: (!) 125/47  HR: 82  Onset and Duration: Gradual and Present longer than 3 months Cause of pain: Unknown Severity: No change since onset, NAS-11 at its worse: 10/10, NAS-11 at its best: 10/10 and NAS-11 now: 0/10 Timing: Night Aggravating Factors: Prolonged sitting Alleviating Factors: Walking Associated Problems: Pain that does not allow patient to sleep Quality of Pain: Sharp, Shooting and Stabbing Previous Examinations or Tests: CT scan and X-rays Previous Treatments: The patient denies none listed  The patient comes into the clinics today for the first time for a chronic pain management evaluation.  According to the patient his primary area of pain is that of his lower extremities, specifically his feet, bilaterally, with the left being worse than the right.  The pain is described to have 2 components: an intermittent, sharp, shooting, electrical-like pain; and a burning sensation.  Interestingly, both are described to be intermittent and to get better with walking.  They have no relation to the circadian cycle.  Primary area of pain Location: Bilateral lower extremities, specifically affecting his feet and toes. Laterality: Bilateral Predominance: (L>R) Onset: Gradual, likely degenerative. Duration: > 3 mo, therefore chronic. Timing/Circadian cycle influence: Denies  any apparent association. Recent Imaging (x-rays,  CT, MRI, and/or [place]) (< 2 yrs): Available thru [facility] Other Studies: Pending possible nerve conduction test. Physical Therapy trial: Ongoing with an emphasis on mobility.  Leg elevation has yielded benefits regarding decreased swelling of the lower extremities. Surgical Hx: The patient has undergone amputation of the toe due to osteomyelitis. (DOSx: 10/29/2017) Described as uneventful. Interventional Tx: None          Historic Controlled Substance Pharmacotherapy Review  PMP and historical list of controlled substances: Tramadol 50 mg 1 tablet p.o. twice daily; hydrocodone/APAP 10/325 1 tablet p.o. 4 times daily; Lyrica 50 mg 1 tablet p.o. 3 times daily; oxycodone/APAP 5/325 1 tablet p.o. every 4 hours as needed. Highest opioid analgesic regimen found: Oxycodone/APAP 5/325 1 tablet p.o. every 4 hours as needed Most recent opioid analgesic: Tramadol 50 mg 1 tablet p.o. twice daily + hydrocodone/APAP 10/325 1 tablet p.o. 4 times daily Current opioid analgesics: Tramadol 50 mg 1 tablet p.o. twice daily + hydrocodone/APAP 10/325 1 tablet p.o. 4 times daily  Highest recorded MME/day: 52.5 mg/day MME/day: 46.88 mg/day Medications: Bottles were not available for inspection. Pharmacodynamics: Desired effects: Analgesia: The patient reports <50% benefit. Reported improvement in function: The patient reports that the medications have not eliminated the pain. Clinically meaningful improvement in function (CMIF): Medication does not meet basic CMIF Perceived effectiveness: Described as ineffective and would like to make some changes Undesirable effects: Side-effects or Adverse reactions: Multiple.  Side effects of gabapentin (Neurontin) have included memory loss, muscle weakness, and very little effectiveness in controlling the pain.  Side effects associated with the Lyrica trial included hallucinations and no analgesic benefit.  Side effects from the tramadol and the hydrocodone have included a decreasing  cognition which remained present even at reduced doses.  Trazodone was associated to tachycardia. Historical Monitoring: The patient  reports that he does not use drugs. List of all UDS Test(s): No results found. List of other Serum/Urine Drug Screening Test(s):  No results found. Historical Background Evaluation: [place] PMP: Six (6) year initial data search conducted. No abnormal patterns identified.       PMP NARX Score Report:  Narcotic: 340 Sedative: 240 Stimulant: 000 [place] Department of public safety, offender search: Editor, commissioning Information) Non-contributory Risk Assessment Profile: Aberrant behavior: None observed or detected today Risk factors for fatal opioid overdose: kidney disease and male gender PMP NARX Overdose Risk Score: 200 Fatal overdose hazard ratio (HR): Calculation deferred Non-fatal overdose hazard ratio (HR): Calculation deferred Risk of opioid abuse or dependence: 0.7-3.0% with doses ? 36 MME/day and 6.1-26% with doses ? 120 MME/day. Substance use disorder (SUD) risk level: Low Personal History of Substance Abuse (SUD-Substance use disorder):  Alcohol: Negative  Illegal Drugs: Negative  Rx Drugs: Negative  ORT Risk Level calculation: Low Risk Opioid Risk Tool - 12/15/17 1302      Family History of Substance Abuse   Alcohol  Positive Male    Illegal Drugs  Negative    Rx Drugs  Negative      Personal History of Substance Abuse   Alcohol  Negative    Illegal Drugs  Negative    Rx Drugs  Negative      Age   Age between 39-45 years   No      History of Preadolescent Sexual Abuse   History of Preadolescent Sexual Abuse  Negative or Male      Psychological Disease   Psychological Disease  Negative    Depression  Negative  Total Score   Opioid Risk Tool Scoring  3    Opioid Risk Interpretation  Low Risk      ORT Scoring interpretation table:  Score <3 = Low Risk for SUD  Score between 4-7 = Moderate Risk for SUD  Score >8 = High Risk for Opioid  Abuse   PHQ-2 Depression Scale:  Total score: 0  PHQ-2 Scoring interpretation table: (Score and probability of major depressive disorder)  Score 0 = No depression  Score 1 = 15.4% Probability  Score 2 = 21.1% Probability  Score 3 = 38.4% Probability  Score 4 = 45.5% Probability  Score 5 = 56.4% Probability  Score 6 = 78.6% Probability   PHQ-9 Depression Scale:  Total score: 0  PHQ-9 Scoring interpretation table:  Score 0-4 = No depression  Score 5-9 = Mild depression  Score 10-14 = Moderate depression  Score 15-19 = Moderately severe depression  Score 20-27 = Severe depression (2.4 times higher risk of SUD and 2.89 times higher risk of overuse)   Pharmacologic Plan: As per protocol, I have not taken over any controlled substance management.             Meds   Current Outpatient Medications:  .  acetaminophen (TYLENOL) 325 MG tablet, Take 2 tablets (650 mg total) by mouth every 6 (six) hours as needed for mild pain (or Fever >/= 101)., Disp: , Rfl:  .  amitriptyline (ELAVIL) 25 MG tablet, Take 1 tablet (25 mg total) by mouth at bedtime. Increase as directed by doctor  to maximal dose 100 mg daily, Disp: 90 tablet, Rfl: 1 .  amLODipine (NORVASC) 10 MG tablet, TAKE 1 TABLET (10 MG TOTAL) BY MOUTH DAILY. (Patient taking differently: Take 10 mg by mouth every morning. ), Disp: 90 tablet, Rfl: 2 .  aspirin 325 MG tablet, Take 325 mg by mouth every evening. , Disp: , Rfl:  .  atorvastatin (LIPITOR) 20 MG tablet, TAKE 1 TABLET (20 MG TOTAL) BY MOUTH DAILY. (Patient taking differently: Take 20 mg by mouth daily at 6 PM. ), Disp: 90 tablet, Rfl: 1 .  Calcium Carbonate-Vitamin D (CALCIUM + D) 600-200 MG-UNIT TABS, Take 1 tablet by mouth 2 (two) times daily.  , Disp: , Rfl:  .  clopidogrel (PLAVIX) 75 MG tablet, TAKE 1 TABLET (75 MG TOTAL) BY MOUTH DAILY., Disp: 90 tablet, Rfl: 1 .  collagenase (SANTYL) ointment, Apply to wound twice a day (Patient taking differently: Apply 1 application  topically daily. Apply to wound twice a day), Disp: 30 g, Rfl: 1 .  cyanocobalamin (,VITAMIN B-12,) 1000 MCG/ML injection, INJECT 1 ML (1,000 MCG TOTAL) INTO THE MUSCLE EVERY 30 (THIRTY) DAYS., Disp: 3 mL, Rfl: 3 .  fenofibrate (TRICOR) 48 MG tablet, TAKE 1 TABLET BY MOUTH DAILY (Patient taking differently: Take 48 mg by mouth every morning. ), Disp: 90 tablet, Rfl: 1 .  furosemide (LASIX) 20 MG tablet, Take 2 tablets (40 mg total) by mouth daily. AS NEEDED FOR FLUID RETENTION (Patient taking differently: Take 20 mg by mouth 2 (two) times daily. ), Disp: 180 tablet, Rfl: 1 .  glucose blood (ACCU-CHEK AVIVA PLUS) test strip, CHECK BLOOD SUGAR 3 TIMES A DAY AS NEEDED DX E11.51, Disp: 300 each, Rfl: 5 .  hydrocortisone 2.5 % cream, Apply 1 application topically 2 (two) times daily as needed., Disp: , Rfl:  .  insulin aspart protamine - aspart (NOVOLOG MIX 70/30 FLEXPEN) (70-30) 100 UNIT/ML FlexPen, INJECT 18 units two times daily ,  adjust dose using sliding scale, Disp: 75 pen, Rfl: 1 .  Insulin Pen Needle (B-D ULTRAFINE III SHORT PEN) 31G X 8 MM MISC, USE AS DIRECTED 3 TIMES A DAY, Disp: 300 each, Rfl: 5 .  Insulin Syringe-Needle U-100 (B-D INS SYRINGE 0.5CC/31GX5/16) 31G X 5/16" 0.5 ML MISC, 1 application by Does not apply route 3 (three) times daily before meals., Disp: 300 each, Rfl: 3 .  latanoprost (XALATAN) 0.005 % ophthalmic solution, Place 1 drop into both eyes at bedtime., Disp: , Rfl: 3 .  losartan (COZAAR) 100 MG tablet, TAKE 1 TABLET BY MOUTH EVERY DAY (Patient taking differently: Take 100 mg by mouth every morning. ), Disp: 90 tablet, Rfl: 1 .  metoprolol tartrate (LOPRESSOR) 100 MG tablet, TAKE 1 TABLET BY MOUTH TWICE A DAY (Patient taking differently: Take 50 mg by mouth 2 (two) times daily. ), Disp: 180 tablet, Rfl: 1 .  Multiple Vitamin (MULTIVITAMIN) tablet, Take 1 tablet by mouth every evening. , Disp: , Rfl:  .  polyethylene glycol (MIRALAX / GLYCOLAX) packet, Take 17 g by mouth daily  as needed for mild constipation., Disp: 14 each, Rfl: 0 .  protein supplement shake (PREMIER PROTEIN) LIQD, Take 325 mLs (11 oz total) by mouth 2 (two) times daily between meals., Disp: 60 Can, Rfl: 0 .  silver sulfADIAZINE (SILVADENE) 1 % cream, Apply 1 application topically daily. Apply amputation site and small toe daily, Disp: 50 g, Rfl: 0 .  Syringe, Disposable, 3 ML MISC, For use with B12 injections weekly/monthly, Disp: 25 each, Rfl: 0 .  SYRINGE-NEEDLE, DISP, 3 ML (B-D 3CC LUER-LOK SYR 25GX1") 25G X 1" 3 ML MISC, FOR USE WITH B12 INJECTIONS WEEKLY/MONTHLY, Disp: 50 each, Rfl: 1  Imaging Review  Thoracic Imaging: Thoracic DG w/swimmers view:  Results for orders placed in visit on 01/08/16  DG Thoracic Spine W/Swimmers   Narrative CLINICAL DATA:  Persistent right-sided back pain.  EXAM: THORACIC SPINE - 3 VIEWS  COMPARISON:  None.  FINDINGS: There is no evidence of thoracic spine fracture. Alignment is normal. Multilevel osteoarthritic changes, particularly worse at the cervical thoracic junction noted. These changes include multilevel disc space narrowing, remodeling of vertebral bodies and anterior osteophyte formation.  Postsurgical changes from CABG are seen.  IMPRESSION: No evidence of acute fracture.  Multilevel osteoarthritic changes of the thoracic spine.   Electronically Signed   By: Fidela Salisbury M.D.   On: 01/08/2016 13:56    Lumbosacral Imaging: Lumbar CT w contrast:  Results for orders placed during the hospital encounter of 12/18/10  CT Lumbar Spine W Contrast   Narrative *RADIOLOGY REPORT*  Clinical Data:  Low back pain extending into the posterior aspect of both thighs.  MYELOGRAM LUMBAR  Technique: The intrathecal injection was performed by Dr. Arnoldo Morale via a right paramidline approach at L5-S1.  This will be reported separately. Following injection of intrathecal Omnipaque contrast, spine imaging in multiple projections was performed  using fluoroscopy.  Fluoroscopy Time: 47 seconds.  Comparison:  None.  Findings: A left lateral defect is present at L2-3 with medial deviation of left sided nerve roots.  A much more prominent disc bulge is present at L4-5 with a partial block of contrast.  This suggests moderate to severe central canal stenosis.  There is loss of disc height at L5-S1.  Mild disc bulging is present.  There is some impact on the traversing nerve roots, more prominently on the right.  The upright films demonstrate slight increase in disc bulging at L2-  3 upon standing.  The disc bulging at L4-5 is stable.  There is no significant change with flexion or extension.  Alignment is maintained.  IMPRESSION:  1.  Prominent disc disease at L4-5 with moderate to severe central canal stenosis. 2.  Disc bulging and left lateral recess narrowing at L2-3.  The disc disease appears worse upon standing. 3.  Chronic loss of disc height at L5-S1.  *RADIOLOGY REPORT*  CT MYELOGRAPHY LUMBAR SPINE  Technique:  CT imaging of the lumbar spine was performed after intrathecal contrast administration.  Multiplanar CT image reconstructions were also generated.  Findings:  The lumbar spine is imaged from midbody of T12-S1.  The conus medullaris terminates at L2, within normal limits.  Mild leftward disc bulging is centered at L3-4.  Bilateral renal artery stents are in place.  There are bilateral iliac artery stents as well.  Atherosclerotic calcifications are noted in the aorta without definite aneurysm.  The individual disc levels are as follows.  T12-L1:  Negative.  L1-2:  Mild disc bulging is present.  There is no significant stenosis.  L2-3:  Mild disc bulging partially effaces the ventral CSF.  There is moderate facet hypertrophy.  The disc bulges to the left.  There is mild left lateral recess and foraminal narrowing.  L3-4:  The vertebral bodies are fused anteriorly.  There is no significant disc  bulging.  There is mild endplate osteophyte formation on the right with encroachment into the right foramen. No significant stenosis is evident.  L4-5:  A broad-based disc bulges present.  Advanced facet hypertrophy and ligamentum flavum thickening is noted.  There is severe central canal stenosis with left greater than right lateral recess narrowing.  Mild foraminal narrowing is present bilaterally.  L5-S1:  Moderate facet hypertrophy is present.  There is significant loss of disc height.  Mild osseous foraminal narrowing is worse on the left.  IMPRESSION:  1.  Severe central canal stenosis at L4-5 with left greater than right lateral recess narrowing secondary to broad-based disc bulging and advanced facet arthropathy. 2. Mild foraminal narrowing bilaterally at L4-5. 3.  Leftward disc bulging at L2-3 with mild left lateral recess and foraminal stenosis. 4.  Moderate facet hypertrophy loss of disc height at L5-S1 with mild osseous foraminal narrowing.  Original Report Authenticated By: Resa Miner. MATTERN, M.D.   Lumbar DG 1V:  Results for orders placed during the hospital encounter of 01/16/11  DG Lumbar Spine 1 View   Narrative *RADIOLOGY REPORT*  Clinical Data: 05/09/2008.  LUMBAR SPINE - 1 VIEW  Comparison: 12/18/2010 post myelogram CT.  Findings: Single intraoperative lateral view of the lumbar spine submitted for review at surgery.  Metallic probe directed towards the L5-S1 level.  Metallic rakes at the J1-8 level.  Surgical sponge space between the upper L4 and the lower L5 vertebra level.  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  Aortic stent is in place.  IMPRESSION: Localization from the upper L4 to the lower L5 level as detailed above.  Original Report Authenticated By: Doug Sou, M.D.   Lumbar DG Myelogram views:  Results for orders placed during the hospital  encounter of 12/18/10  DG Myelogram Lumbar   Narrative *RADIOLOGY REPORT*  Clinical Data:  Low back pain extending into the posterior aspect of both thighs.  MYELOGRAM LUMBAR  Technique: The intrathecal injection was performed by Dr. Arnoldo Morale via a right paramidline approach at L5-S1.  This will be  reported separately. Following injection of intrathecal Omnipaque contrast, spine imaging in multiple projections was performed using fluoroscopy.  Fluoroscopy Time: 47 seconds.  Comparison:  None.  Findings: A left lateral defect is present at L2-3 with medial deviation of left sided nerve roots.  A much more prominent disc bulge is present at L4-5 with a partial block of contrast.  This suggests moderate to severe central canal stenosis.  There is loss of disc height at L5-S1.  Mild disc bulging is present.  There is some impact on the traversing nerve roots, more prominently on the right.  The upright films demonstrate slight increase in disc bulging at L2- 3 upon standing.  The disc bulging at L4-5 is stable.  There is no significant change with flexion or extension.  Alignment is maintained.  IMPRESSION:  1.  Prominent disc disease at L4-5 with moderate to severe central canal stenosis. 2.  Disc bulging and left lateral recess narrowing at L2-3.  The disc disease appears worse upon standing. 3.  Chronic loss of disc height at L5-S1.  *RADIOLOGY REPORT*  CT MYELOGRAPHY LUMBAR SPINE  Technique:  CT imaging of the lumbar spine was performed after intrathecal contrast administration.  Multiplanar CT image reconstructions were also generated.  Findings:  The lumbar spine is imaged from midbody of T12-S1.  The conus medullaris terminates at L2, within normal limits.  Mild leftward disc bulging is centered at L3-4.  Bilateral renal artery stents are in place.  There are bilateral iliac artery stents as well.  Atherosclerotic calcifications are noted in the aorta without  definite aneurysm.  The individual disc levels are as follows.  T12-L1:  Negative.  L1-2:  Mild disc bulging is present.  There is no significant stenosis.  L2-3:  Mild disc bulging partially effaces the ventral CSF.  There is moderate facet hypertrophy.  The disc bulges to the left.  There is mild left lateral recess and foraminal narrowing.  L3-4:  The vertebral bodies are fused anteriorly.  There is no significant disc bulging.  There is mild endplate osteophyte formation on the right with encroachment into the right foramen. No significant stenosis is evident.  L4-5:  A broad-based disc bulges present.  Advanced facet hypertrophy and ligamentum flavum thickening is noted.  There is severe central canal stenosis with left greater than right lateral recess narrowing.  Mild foraminal narrowing is present bilaterally.  L5-S1:  Moderate facet hypertrophy is present.  There is significant loss of disc height.  Mild osseous foraminal narrowing is worse on the left.  IMPRESSION:  1.  Severe central canal stenosis at L4-5 with left greater than right lateral recess narrowing secondary to broad-based disc bulging and advanced facet arthropathy. 2. Mild foraminal narrowing bilaterally at L4-5. 3.  Leftward disc bulging at L2-3 with mild left lateral recess and foraminal stenosis. 4.  Moderate facet hypertrophy loss of disc height at L5-S1 with mild osseous foraminal narrowing.  Original Report Authenticated By: Resa Miner. MATTERN, M.D.   Ankle Imaging: Ankle-R DG Complete:  Results for orders placed during the hospital encounter of 10/03/17  DG Ankle Complete Right   Narrative CLINICAL DATA:  Swelling RIGHT lower leg for 2 days, redness, history diabetes mellitus, hypertension, coronary artery disease, peripheral arterial disease  EXAM: RIGHT ANKLE - COMPLETE 3+ VIEW  COMPARISON:  None  FINDINGS: Scattered soft tissue swelling RIGHT lower leg and ankle  especially medial ankle.  Osseous mineralization normal.  Joint spaces preserved.  Small plantar calcaneal spur.  No  acute fracture, dislocation, or bone destruction.  Extensive small vessel vascular calcifications at the RIGHT lower leg into RIGHT foot.  IMPRESSION: Soft tissue swelling and extensive small vessel vascular calcifications without acute osseous abnormalities.   Electronically Signed   By: Lavonia Dana M.D.   On: 10/03/2017 10:34    Foot Imaging: Foot-R DG Complete:  Results for orders placed in visit on 10/09/11  DG Foot Complete Right   Foot-L DG Complete:  Results for orders placed during the hospital encounter of 11/07/17  DG Foot Complete Left   Narrative CLINICAL DATA:  Redness and swelling after toe amputation 2 weeks ago.  EXAM: LEFT FOOT - COMPLETE 3+ VIEW  COMPARISON:  CT LEFT foot October 22, 2017  FINDINGS: New erosive changes third metatarsal head, status post resection of third phalanges. No acute fracture deformity. No dislocation. Mild first MTP osteoarthrosis. Slight widening of the second MTP joint space. Type 2 navicular bone. Severe vascular calcifications. Soft tissue swelling without subcutaneous gas or radiopaque foreign bodies.  IMPRESSION: 1. S/p third toe amputation. New postoperative defect third metatarsal head versus osteomyelitis. 2. Second MTP joint space widening seen with effusion or projectional artifact. 3. Soft tissue swelling, no subcutaneous gas.   Electronically Signed   By: Elon Alas M.D.   On: 11/07/2017 13:51    Complexity Note: Imaging results reviewed.                         ROS  Cardiovascular: Heart trouble, Daily Aspirin intake, High blood pressure, Heart surgery, Pacemaker or defibrillator, Heart murmur and Blood thinners:  Anticoagulant Pulmonary or Respiratory: Snoring  Neurological: No reported neurological signs or symptoms such as seizures, abnormal skin sensations, urinary  and/or fecal incontinence, being born with an abnormal open spine and/or a tethered spinal cord Review of Past Neurological Studies: No results found for this or any previous visit. Psychological-Psychiatric: No reported psychological or psychiatric signs or symptoms such as difficulty sleeping, anxiety, depression, delusions or hallucinations (schizophrenial), mood swings (bipolar disorders) or suicidal ideations or attempts Gastrointestinal: No reported gastrointestinal signs or symptoms such as vomiting or evacuating blood, reflux, heartburn, alternating episodes of diarrhea and constipation, inflamed or scarred liver, or pancreas or irrregular and/or infrequent bowel movements Genitourinary: No reported renal or genitourinary signs or symptoms such as difficulty voiding or producing urine, peeing blood, non-functioning kidney, kidney stones, difficulty emptying the bladder, difficulty controlling the flow of urine, or chronic kidney disease Hematological: Brusing easily Endocrine: High blood sugar requiring insulin (IDDM) Rheumatologic: No reported rheumatological signs and symptoms such as fatigue, joint pain, tenderness, swelling, redness, heat, stiffness, decreased range of motion, with or without associated rash Musculoskeletal: Negative for myasthenia gravis, muscular dystrophy, multiple sclerosis or malignant hyperthermia Work History: Retired  Allergies  Mr. Mendell is allergic to hydrocodone-acetaminophen; lyrica [pregabalin]; tramadol; and trazodone and nefazodone.  Laboratory Chemistry  Inflammation Markers (CRP: Acute Phase) (ESR: Chronic Phase) Lab Results  Component Value Date   ESRSEDRATE 56 (H) 11/07/2017                         Rheumatology Markers No results found.  Renal Function Markers Lab Results  Component Value Date   BUN 32 (H) 11/10/2017   CREATININE 1.94 (H) 11/10/2017   GFRAA 33 (L) 11/10/2017   GFRNONAA 29 (L) 11/10/2017  Hepatic Function Markers Lab Results  Component Value Date   AST 16 11/07/2017   ALT 13 11/07/2017   ALBUMIN 3.5 11/07/2017   ALKPHOS 59 11/07/2017                        Electrolytes Lab Results  Component Value Date   NA 139 11/10/2017   K 4.0 11/10/2017   CL 102 11/10/2017   CALCIUM 8.7 (L) 11/10/2017   MG 1.7 11/10/2017   PHOS 2.6 07/23/2017                        Neuropathy Markers Lab Results  Component Value Date   VITAMINB12 206 (L) 06/26/2015   HGBA1C 7.2 (H) 11/07/2017                        CNS Tests No results found.  Bone Pathology Markers No results found.  Coagulation Parameters Lab Results  Component Value Date   INR 1.11 11/07/2017   LABPROT 14.2 11/07/2017   APTT 32 11/07/2017   PLT 291 11/10/2017                        Cardiovascular Markers Lab Results  Component Value Date   CKTOTAL 30 (L) 11/07/2017   TROPONINI 0.03 (HH) 10/03/2017   HGB 10.0 (L) 11/10/2017   HCT 29.6 (L) 11/10/2017                         CA Markers No results found.  Note: Lab results reviewed.  [place]  Drug: Mr. Windmiller  reports that he does not use drugs. Alcohol:  reports that he does not drink alcohol. Tobacco:  reports that he quit smoking about 32 years ago. He has never used smokeless tobacco. Medical:  has a past medical history of Arthritis, Blood transfusion, Cancer ([place]), Coronary artery disease, Diabetes mellitus, H/O: malaria (1941), Hyperlipidemia, Hypertension, Peripheral artery disease ([place]), and Renal insufficiency. Family: family history includes Diabetes in his father; Mental illness (age of onset: 37) in his mother; Peripheral vascular disease in his father.  Past Surgical History:  Procedure Laterality Date  . AMPUTATION Left 10/29/2017   Procedure: AMPUTATION RAY ( THIRD TOE );  Surgeon: Algernon Huxley, MD;  Location: [facility];  Service: Vascular;  Laterality: Left;  . CORONARY ARTERY BYPASS GRAFT  2001   4 vessel[place] done in [place]   . FEMORAL  BYPASS  2011   done in [place],  now followed by [facility]  . INSERT / REPLACE / REMOVE PACEMAKER     2004  . LOWER EXTREMITY ANGIOGRAPHY Left 08/18/2017   Procedure: LOWER EXTREMITY ANGIOGRAPHY;  Surgeon: Algernon Huxley, MD;  Location: [place] [facility];  Service: Cardiovascular;  Laterality: Left;  . LOWER EXTREMITY ANGIOGRAPHY Left 10/20/2017   Procedure: LOWER EXTREMITY ANGIOGRAPHY;  Surgeon: Algernon Huxley, MD;  Location: [place] [facility];  Service: Cardiovascular;  Laterality: Left;  . LOWER EXTREMITY ANGIOGRAPHY Right 10/27/2017   Procedure: LOWER EXTREMITY ANGIOGRAPHY;  Surgeon: Algernon Huxley, MD;  Location: [place] [facility];  Service: Cardiovascular;  Laterality: Right;  . LOWER EXTREMITY ANGIOGRAPHY Left 11/10/2017   Procedure: Lower Extremity Angiography;  Surgeon: Algernon Huxley, MD;  Location: [place] [facility];  Service: Cardiovascular;  Laterality: Left;  . LUMBAR LAMINECTOMY/DECOMPRESSION MICRODISCECTOMY  01/16/2011   Procedure: LUMBAR LAMINECTOMY/DECOMPRESSION  MICRODISCECTOMY;  Surgeon: Ophelia Charter;  Location: [place] [facility];  Service: Neurosurgery;  Laterality: N/A;  Lumbar four and lumbar five laminectomies   . PACEMAKER INSERTION    . SPINE SURGERY     Active Ambulatory Problems    Diagnosis Date Noted  . Hypertension 09/28/2010  . Hyperlipidemia with target LDL less than 70 09/28/2010  . Coronary artery occlusion without or not resulting in myocardial infarction ([place]) 09/28/2010  . Spinal stenosis of lumbar region 09/28/2010  . Screening for colon cancer 09/28/2010  . Diabetic neuropathy associated with type 2 diabetes mellitus ([place]) 09/28/2010  . Peripheral vascular disease due to secondary diabetes ([place]) 09/28/2010  . PVD (peripheral vascular disease) ([place])   . 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 Diabetes mellitus with peripheral vascular disease ([place]) 07/03/2011  . Skin lesion of scalp 07/03/2011  . Insomnia  07/03/2011  . Overweight (BMI 25.0-29.9) 04/27/2012  . Do not resuscitate discussion 07/28/2012  . Edema 11/03/2012  . CKD (chronic kidney disease) stage 3, GFR 30-59 ml/min (HCC) 12/14/2013  . Undiagnosed cardiac murmurs 06/13/2014  . Grief reaction 06/14/2014  . Diabetes mellitus with chronic kidney disease ([place]) 06/14/2014  . Pain in thumb joint with movement of right hand 12/25/2015  . Aortic stenosis 04/12/2017  . Atherosclerosis of native arteries of the extremities with ulceration ([place]) 06/06/2017  . Chronic lower extremity pain (Primary Area of Pain) (Bilateral) (L>R) 09/04/2017  . Cellulitis of left foot 11/07/2017  . Hospital discharge follow-up 11/16/2017  . Diabetic peripheral neuropathy ([place]) 12/15/2017  . 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 History of lumbar surgery 12/15/2017  . Chronic pain syndrome 12/15/2017  . Disorder of skeletal system 12/15/2017  . Pharmacologic therapy 12/15/2017  . Problems influencing health status 12/15/2017   Resolved Ambulatory Problems    Diagnosis Date Noted  . Diabetes mellitus type 2 with peripheral artery disease ([place]) 09/28/2010  . Edema extremities 10/21/2011  . Acute right-sided thoracic back pain 01/09/2016   Past Medical History:  Diagnosis Date  . Arthritis   . Blood transfusion   . Cancer ([place])   . Coronary artery disease   . Diabetes mellitus   . H/O: malaria 1941  . Hyperlipidemia   . Peripheral artery disease ([place])   . Renal insufficiency    Constitutional Exam  General appearance: Well nourished, well developed, and well hydrated. In no apparent acute distress Vitals:   12/15/17 1248  BP: (!) 125/47  Pulse: 82  Resp: 18  Temp: (!) 97.5 F (36.4 C)  SpO2: 100%  Weight: 168 lb (76.2 kg)  Height: '5\' 8"'  (1.727 m)   BMI Assessment: Estimated body mass index is 25.54 kg/m as calculated from the following:   Height as of this encounter: '5\' 8"'  (1.727 m).   Weight  as of this encounter: 168 lb (76.2 kg).  BMI interpretation table: BMI level Category Range association with higher incidence of chronic pain  <18 kg/m2 Underweight   18.5-24.9 kg/m2 Ideal body weight   25-29.9 kg/m2 Overweight Increased incidence by 20%  30-34.9 kg/m2 Obese (Class I) Increased incidence by 68%  35-39.9 kg/m2 Severe obesity (Class II) Increased incidence by 136%  >40 kg/m2 Extreme obesity (Class III) Increased incidence by 254%   Patient's current BMI Ideal Body weight  Body mass index is 25.54 kg/m. Ideal body weight: 68.4 kg (150 lb 12.7 oz) Adjusted ideal body weight: 71.5 kg (157  lb 10.8 oz)   BMI Readings from Last 4 Encounters:  12/15/17 25.54 kg/m  11/25/17 25.39 kg/m  11/19/17 29.01 kg/m  11/14/17 25.82 kg/m   Wt Readings from Last 4 Encounters:  12/15/17 168 lb (76.2 kg)  11/25/17 167 lb (75.8 kg)  11/19/17 169 lb (76.7 kg)  11/14/17 169 lb 12.8 oz (77 kg)  Psych/Mental status: Alert, oriented x 3 (person, place, & time)       Respiratory: No evidence of acute respiratory distress  Gait & Posture Assessment  Ambulation: Patient ambulates using a walker Gait: Significantly limited. Dependent on assistive device to ambulate Posture: Antalgic   Lower Extremity Exam    Side: Right lower extremity  Side: Left lower extremity  Stability: No instability observed          Stability: No instability observed          Skin & Extremity Inspection: 2/4 pitting edema. Evidence of bluish coloration but with adequate capillary refill. Chronic ulceration of tip of second toe w/o gross evidence of infection. Area of skin exudate over dorsum of foot w/ epidermal defect.  Skin & Extremity Inspection: 2/4 pitting edema. Foot is covered w/ loose, nonocclusive bandage. Toes demonstrate similar findings as those seen in the right foot. Evidence of surgical RAY 3rd toe amputation. No gross evidence of infection.  Functional ROM: Unrestricted ROM                   Functional ROM: Unrestricted ROM                  Muscle Tone/Strength: Deconditioned  Muscle Tone/Strength: Deconditioned  Sensory (Neurological): Neurogenic pain pattern  Sensory (Neurological): Neurogenic pain pattern  Palpation: Complains of area being tender to palpation. Foot is euthermic.  Palpation: Complains of area being tender to palpation. Foot is euthermic.   Assessment  Primary Diagnosis & Pertinent Problem List: The primary encounter diagnosis was Chronic pain syndrome. 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  Visit Diagnosis (New problems to examiner): 1. Chronic pain syndrome   2. Chronic lower extremity pain (Primary Area of Pain) (Bilateral) (L>R)   3. Diabetic peripheral neuropathy ([place])   4. PVD (peripheral vascular disease) ([place])   5. 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 History of lumbar surgery   7. Disorder of skeletal system   8. Pharmacologic therapy   9. Problems influencing health status    Plan of Care  Note: Today we spent more than 1 hour going over the patient's history, physical exam, laboratory findings, imaging, and prior therapies, including side effects and effectiveness.  In addition, they brought in a list of questions which I tried to call over to the best of my abilities. This patient's pain seems to be coming from a combination of a diabetic peripheral neuropathy with a significant component of neurogenic pain, as well as a possible component of ischemic pain.  He clearly has diabetic ulcers which are normally not produced by the diabetic peripheral neuropathy, but secondary to  the vascular problems.  At this point, he does seem to have adequate capillary filling, but this could be secondary to his Plavix and the blood  thinning effects of the aspirin.  Based on the patient's condition, the pain may be managed by a possible combination of improvement in blood flow and oxygenation, as well as treating the peripheral neuropathy with a regiment of membrane stabilizers, perhaps combined with medications to increase the levels of serotonin (TCA's and/or SSRI's).  Medical problems that are complicating the possible treatments include: his anticoagulation, chronic kidney disease, and his DM.   Questions: The first question that they had was whether or not there were any test that could help guide this therapy without causing any more pain/stress.  I made it clear that I could only answer things that were directly related to my specialty (pain management).  They mention the possibility of nerve conduction tests and although this may be helpful in determining whether we are dealing with a diabetic peripheral neuropathy affecting only small fibers versus large fiber disease, I am not entirely sure if that would change significantly what can be done for this patient.  The next question had to do with which therapies could provide the most pain relief with minimal cognitive and strength impact.  They included in the question example such as nerve blocks, cortisone injections, acupuncture, naltrexone, spinal cord stimulation devices, CBD/hemp, etc.  I attempted to answer this by taking each 1 separately.  Regarding nerve blocks, I informed them that the patient could benefit from a diagnostic lumbar sympathetic block.  I took the time to explain how these blocks work and how it would be primarily diagnostic.  If he was to attain good relief of the pain for the duration of the numbing medicine, this could suggest that he may be a candidate for radiofrequency ablation of the lumbar sympathetic chain.   I explained that by blocking the sympathetics to the lower extremities, this usually causes an increase in blood flow which in turn would increase oxygenation to the affected tissues thereby possibly speeding up the healing.  Cortisone injections on the other hand may not be a good idea in this patient since he has diabetes and it would cause an upward fluctuation in his blood sugar, possibly getting his diabetes under control.  I explained to them that acupuncture usually works by stimulating the body to release endorphins, which are natural potent opioids of relatively short duration, therefore requiring ongoing sessions.  However, because the patient can also obtain the levels opioid analgesics exogenously, it would not make much sense to submit him to constant sessions of acupuncture to simply have his body to what can be accomplished by taking a pill.  Although naltrexone was included among the possible therapies, I really do not see how this would come in into the treatment of his pain unless they were thinking about minimizing the side effects of the steroids by administering a concomitant reversal agent such as the naltrexone.  Following this train of thought, it may be a better idea to consider an agonist/antagonist agent such as the buprenorphine. Current brand names available for this medication include: Belbuca, Probuphine, Buprenex, and Butrans.  In the case of spinal cord stimulation, the primary concern here would be the patient's age, the possibility of spinal stenosis, and the increased risk associated with the surgery and infections secondary to his chronic kidney disease, peripheral vascular disease, coronary artery disease, and diabetes mellitus.  They also asked about CBD/hemp and I did provide them with some written information regarding this option.  The information provided to them is one that I normally give all of my  patients.  The final question had to do with which specialist would be  the best to lead this patient's treatment.  Undoubtedly, it would be up to the patient's primary care physician to coordinate care, but there is absolutely no way that one specialist would be able to be effective in treating this patient's condition due to its complexity.  I believe that he would probably need a neurologist to fully evaluate and manage his neuropathy, and endocrinologist to manage his diabetes, a cardiologist to manage his coronary artery disease, a vascular surgeon to manage his peripheral vascular disease, and the primary care physician to coordinate care.  With regards to what I can bring to the table, I had already mentioned the possibility of the diagnostic lumbar sympathetic block.  Problem-specific plan: No problem-specific Assessment & Plan notes found for this encounter.  Lab Orders  No laboratory test(s) ordered today   Imaging Orders  No imaging studies ordered today   Referral Orders  No referral(s) requested today    Procedure Orders     LUMBAR SYMPATHETIC BLOCK Pharmacotherapy (current): Medications ordered:  No orders of the defined types were placed in this encounter.  Medications administered during this visit: Yevonne Aline had no medications administered during this visit.   Pharmacological management options:  Opioid Analgesics: The patient was informed that there is no guarantee that he would be a candidate for opioid analgesics. The decision will be made following CDC guidelines. This decision will be based on the results of diagnostic studies, as well as Mr. Conwell's risk profile.   Membrane stabilizer: The patient has undergone a trial of both, gabapentin and pregabalin.  He was not able to tolerate the pregabalin (Lyrica) due to hallucinations and lack of analgesic effectiveness.  On the other hand, the gabapentin (Neurontin) also gave him some side effects in the form of grogginess, memory loss, and muscle weakness.  He indicates having  experienced little to no analgesic benefit and therefore he discontinued the medication on 11/15/2017.  A possible alternative/recommendation would be to try some Horizon, which is a type of gabapentin but the absorption occurs at a different site in the gastrointestinal tract which occasionally provides the patient with better analgesic results.  Since they probably still have some gabapentin at home, I did provide them with the written titration instructions that I normally provide my patients with when doing a trial.  I noticed that he was initially started using the 300 mg dose, which in my experience tends to be rather strong for elder patients.  It may also be beneficial to attempt a trial starting with the 100 mg capsules.  Muscle relaxant: Although the patient did describe having sharp, stabbing-like pains, he did not describe them as being cramps.  Because of this, I am not very helpful that a trial of muscle relaxants would be of any significant benefit.  NSAID: Medically contraindicated.  Because the patient is currently anticoagulated on Plavix, I would be very nervous having him take any type of nonsteroidal anti-inflammatory drug due to the possible risk of GI bleeding while anticoagulated.  In fact, the patient describes taking ASA 325 mg, which I would suggest bringing down to 81 mg.  Other analgesic(s): The patient was recently started on a trial of amitriptyline on November 7 in an effort to help him with insomnia as well as his pain.  Amitriptyline (Elavil) is a try cyclic antidepressant that stimulates the production of serotonin, a substance known to assist in pain tolerance  as well as depression.  The most common side effect of this medication is that it makes patient sleepy, which is clearly being used in this patient for his insomnia.  Although not analgesic, medications that could improve blood flow and oxygenation to the peripheral tissues could be of benefit.  Options here would include  things like Persantine (dipyridamole - vasodilator/blood thinner) and Trental  (pentoxifylline,  a xanthine derivative that belongs to a group of vasoactive drugs which may improve peripheral blood flow and thus enhance peripheral tissue oxygenation.) Dipyridamole may extend the life of platelets while also decreasing platelet aggregation.  However, some studies may have suggested that it could also worsen regional myocardial perfusion distal to partial occlusions of coronary arteries. Trental improves flexibility of red blood cells. This increase in the flexibility of red blood cells probably contributes to the improvement of the ability of blood to flow through peripheral vessels.   Interventional management options: Mr. Hunsinger was informed that there is no guarantee that he would be a candidate for interventional therapies. The decision will be based on the results of diagnostic studies, as well as Mr. Atwood's risk profile.  Procedure(s) under consideration:  Diagnostic left lumbar sympathetic block #1 under fluoroscopic guidance and IV sedation.    Provider-requested follow-up: Return for PRN Procedure: (L) LSB #1.  Future Appointments  Date Time Provider [place]  12/25/2017 10:15 AM Jeri Cos [place] III, PA-C [facility] None  02/03/2018 10:30 AM [facility] None  02/03/2018 11:00 AM [facility] None  02/03/2018 11:45 AM Dew, Erskine Squibb, MD [facility] None  03/19/2018  2:00 PM O'Brien-Blaney, Bryson [place]ona, LPN [facility]    Primary Care Physician: Crecencio Mc, MD Location: [facility] Note by: Gaspar Cola, MD Date: 12/15/2017; Time: 4:59 PM

## 2017-12-15 ENCOUNTER — Encounter: Payer: Self-pay | Admitting: Pain Medicine

## 2017-12-15 ENCOUNTER — Ambulatory Visit: Payer: Medicare Other | Admitting: Nurse Practitioner

## 2017-12-15 ENCOUNTER — Other Ambulatory Visit: Payer: Self-pay

## 2017-12-15 ENCOUNTER — Ambulatory Visit: Payer: Medicare Other | Attending: Nurse Practitioner | Admitting: Pain Medicine

## 2017-12-15 ENCOUNTER — Encounter (INDEPENDENT_AMBULATORY_CARE_PROVIDER_SITE_OTHER): Payer: Self-pay | Admitting: Nurse Practitioner

## 2017-12-15 VITALS — BP 125/47 | HR 82 | Temp 97.5°F | Resp 18 | Ht 68.0 in | Wt 168.0 lb

## 2017-12-15 DIAGNOSIS — Z789 Other specified health status: Secondary | ICD-10-CM | POA: Diagnosis not present

## 2017-12-15 DIAGNOSIS — Z794 Long term (current) use of insulin: Secondary | ICD-10-CM | POA: Insufficient documentation

## 2017-12-15 DIAGNOSIS — I7025 Atherosclerosis of native arteries of other extremities with ulceration: Secondary | ICD-10-CM

## 2017-12-15 DIAGNOSIS — I70203 Unspecified atherosclerosis of native arteries of extremities, bilateral legs: Secondary | ICD-10-CM | POA: Diagnosis not present

## 2017-12-15 DIAGNOSIS — Z833 Family history of diabetes mellitus: Secondary | ICD-10-CM | POA: Insufficient documentation

## 2017-12-15 DIAGNOSIS — M48061 Spinal stenosis, lumbar region without neurogenic claudication: Secondary | ICD-10-CM | POA: Insufficient documentation

## 2017-12-15 DIAGNOSIS — E11621 Type 2 diabetes mellitus with foot ulcer: Secondary | ICD-10-CM | POA: Diagnosis not present

## 2017-12-15 DIAGNOSIS — Z951 Presence of aortocoronary bypass graft: Secondary | ICD-10-CM | POA: Diagnosis not present

## 2017-12-15 DIAGNOSIS — Z888 Allergy status to other drugs, medicaments and biological substances status: Secondary | ICD-10-CM | POA: Diagnosis not present

## 2017-12-15 DIAGNOSIS — Z885 Allergy status to narcotic agent status: Secondary | ICD-10-CM | POA: Insufficient documentation

## 2017-12-15 DIAGNOSIS — M79604 Pain in right leg: Secondary | ICD-10-CM | POA: Diagnosis not present

## 2017-12-15 DIAGNOSIS — N183 Chronic kidney disease, stage 3 (moderate): Secondary | ICD-10-CM | POA: Diagnosis not present

## 2017-12-15 DIAGNOSIS — G894 Chronic pain syndrome: Secondary | ICD-10-CM | POA: Diagnosis not present

## 2017-12-15 DIAGNOSIS — Z87891 Personal history of nicotine dependence: Secondary | ICD-10-CM | POA: Diagnosis not present

## 2017-12-15 DIAGNOSIS — E1142 Type 2 diabetes mellitus with diabetic polyneuropathy: Secondary | ICD-10-CM | POA: Diagnosis not present

## 2017-12-15 DIAGNOSIS — I739 Peripheral vascular disease, unspecified: Secondary | ICD-10-CM | POA: Diagnosis not present

## 2017-12-15 DIAGNOSIS — Z7982 Long term (current) use of aspirin: Secondary | ICD-10-CM | POA: Insufficient documentation

## 2017-12-15 DIAGNOSIS — M79605 Pain in left leg: Secondary | ICD-10-CM | POA: Diagnosis not present

## 2017-12-15 DIAGNOSIS — Z9889 Other specified postprocedural states: Secondary | ICD-10-CM | POA: Diagnosis not present

## 2017-12-15 DIAGNOSIS — E1151 Type 2 diabetes mellitus with diabetic peripheral angiopathy without gangrene: Secondary | ICD-10-CM | POA: Diagnosis not present

## 2017-12-15 DIAGNOSIS — G8929 Other chronic pain: Secondary | ICD-10-CM

## 2017-12-15 DIAGNOSIS — E1122 Type 2 diabetes mellitus with diabetic chronic kidney disease: Secondary | ICD-10-CM | POA: Diagnosis not present

## 2017-12-15 DIAGNOSIS — M899 Disorder of bone, unspecified: Secondary | ICD-10-CM

## 2017-12-15 DIAGNOSIS — I70209 Unspecified atherosclerosis of native arteries of extremities, unspecified extremity: Secondary | ICD-10-CM | POA: Diagnosis not present

## 2017-12-15 DIAGNOSIS — Z89422 Acquired absence of other left toe(s): Secondary | ICD-10-CM | POA: Insufficient documentation

## 2017-12-15 DIAGNOSIS — M79672 Pain in left foot: Secondary | ICD-10-CM | POA: Diagnosis present

## 2017-12-15 DIAGNOSIS — L97513 Non-pressure chronic ulcer of other part of right foot with necrosis of muscle: Secondary | ICD-10-CM | POA: Diagnosis not present

## 2017-12-15 DIAGNOSIS — I129 Hypertensive chronic kidney disease with stage 1 through stage 4 chronic kidney disease, or unspecified chronic kidney disease: Secondary | ICD-10-CM | POA: Diagnosis not present

## 2017-12-15 DIAGNOSIS — Z66 Do not resuscitate: Secondary | ICD-10-CM | POA: Insufficient documentation

## 2017-12-15 DIAGNOSIS — E1152 Type 2 diabetes mellitus with diabetic peripheral angiopathy with gangrene: Secondary | ICD-10-CM | POA: Diagnosis not present

## 2017-12-15 DIAGNOSIS — Z79899 Other long term (current) drug therapy: Secondary | ICD-10-CM | POA: Diagnosis not present

## 2017-12-15 DIAGNOSIS — I10 Essential (primary) hypertension: Secondary | ICD-10-CM | POA: Diagnosis not present

## 2017-12-15 DIAGNOSIS — E785 Hyperlipidemia, unspecified: Secondary | ICD-10-CM | POA: Insufficient documentation

## 2017-12-15 NOTE — Patient Instructions (Addendum)
____________________________________________________________________________________________  CANNABIDIOL (AKA: CBD Oil or Pills)  Applies to: All patients receiving prescriptions of controlled substances (written and/or electronic).  General Information: Cannabidiol (CBD) was discovered in 84. It is one of some 113 identified cannabinoids in cannabis (Marijuana) plants, accounting for up to 40% of the plant's extract. As of 2018, preliminary clinical research on cannabidiol included studies of anxiety, cognition, movement disorders, and pain.  Cannabidiol is consummed in multiple ways, including inhalation of cannabis smoke or vapor, as an aerosol spray into the cheek, and by mouth. It may be supplied as CBD oil containing CBD as the active ingredient (no added tetrahydrocannabinol (THC) or terpenes), a full-plant CBD-dominant hemp extract oil, capsules, dried cannabis, or as a liquid solution. CBD is thought not have the same psychoactivity as THC, and may affect the actions of THC. Studies suggest that CBD may interact with different biological targets, including cannabinoid receptors and other neurotransmitter receptors. As of 2018 the mechanism of action for its biological effects has not been determined.  In the [place], cannabidiol has a limited approval by the Food and Drug Administration (FDA) for treatment of only two types of epilepsy disorders. The side effects of long-term use of the drug include somnolence, decreased appetite, diarrhea, fatigue, malaise, weakness, sleeping problems, and others.  CBD remains a Schedule I drug prohibited for any use.  Legality: Some manufacturers ship CBD products nationally, an illegal action which the FDA has not enforced in 2018, with CBD remaining the subject of an FDA investigational new drug evaluation, and is not considered legal as a dietary supplement or food ingredient as of December 2018. Federal illegality has made it difficult  historically to conduct research on CBD. CBD is openly sold in head shops and health food stores in some states where such sales have not been explicitly legalized.  Warning: Because it is not FDA approved for general use or treatment of pain, it is not required to undergo the same manufacturing controls as prescription drugs.  This means that the available cannabidiol (CBD) may be contaminated with THC.  If this is the case, it will trigger a positive urine drug screen (UDS) test for cannabinoids (Marijuana).  Because a positive UDS for illicit substances is a violation of our medication agreement, your opioid analgesics (pain medicine) may be permanently discontinued. (Last update: 04/24/2017) ____________________________________________________________________________________________   ____________________________________________________________________________________________  General Risks and Possible Complications  Patient Responsibilities: It is important that you read this as it is part of your informed consent. It is our duty to inform you of the risks and possible complications associated with treatments offered to you. It is your responsibility as a patient to read this and to ask questions about anything that is not clear or that you believe was not covered in this document.  Patient's Rights: You have the right to refuse treatment. You also have the right to change your mind, even after initially having agreed to have the treatment done. However, under this last option, if you wait until the last second to change your mind, you may be charged for the materials used up to that point.  Introduction: Medicine is not an Chief Strategy Officer. Everything in Medicine, including the lack of treatment(s), carries the potential for danger, harm, or loss (which is by definition: Risk). In Medicine, a complication is a secondary problem, condition, or disease that can aggravate an already existing one. All  treatments carry the risk of possible complications. The fact that a side effects or complications occurs,  does not imply that the treatment was conducted incorrectly. It must be clearly understood that these can happen even when everything is done following the highest safety standards.  No treatment: You can choose not to proceed with the proposed treatment alternative. The "PRO(s)" would include: avoiding the risk of complications associated with the therapy. The "CON(s)" would include: not getting any of the treatment benefits. These benefits fall under one of three categories: diagnostic; therapeutic; and/or palliative. Diagnostic benefits include: getting information which can ultimately lead to improvement of the disease or symptom(s). Therapeutic benefits are those associated with the successful treatment of the disease. Finally, palliative benefits are those related to the decrease of the primary symptoms, without necessarily curing the condition (example: decreasing the pain from a flare-up of a chronic condition, such as incurable terminal cancer).  General Risks and Complications: These are associated to most interventional treatments. They can occur alone, or in combination. They fall under one of the following six (6) categories: no benefit or worsening of symptoms; bleeding; infection; nerve damage; allergic reactions; and/or death. 1. No benefits or worsening of symptoms: In Medicine there are no guarantees, only probabilities. No healthcare provider can ever guarantee that a medical treatment will work, they can only state the probability that it may. Furthermore, there is always the possibility that the condition may worsen, either directly, or indirectly, as a consequence of the treatment. 2. Bleeding: This is more common if the patient is taking a blood thinner, either prescription or over the counter (example: Goody Powders, Fish oil, Aspirin, Garlic, etc.), or if suffering a condition  associated with impaired coagulation (example: Hemophilia, cirrhosis of the liver, low platelet counts, etc.). However, even if you do not have one on these, it can still happen. If you have any of these conditions, or take one of these drugs, make sure to notify your treating physician. 3. Infection: This is more common in patients with a compromised immune system, either due to disease (example: diabetes, cancer, human immunodeficiency virus [HIV], etc.), or due to medications or treatments (example: therapies used to treat cancer and rheumatological diseases). However, even if you do not have one on these, it can still happen. If you have any of these conditions, or take one of these drugs, make sure to notify your treating physician. 4. Nerve Damage: This is more common when the treatment is an invasive one, but it can also happen with the use of medications, such as those used in the treatment of cancer. The damage can occur to small secondary nerves, or to large primary ones, such as those in the spinal cord and brain. This damage may be temporary or permanent and it may lead to impairments that can range from temporary numbness to permanent paralysis and/or brain death. 5. Allergic Reactions: Any time a substance or material comes in contact with our body, there is the possibility of an allergic reaction. These can range from a mild skin rash (contact dermatitis) to a severe systemic reaction (anaphylactic reaction), which can result in death. 6. Death: In general, any medical intervention can result in death, most of the time due to an unforeseen complication. ____________________________________________________________________________________________  ____________________________________________________________________________________________  Preparing for Procedure with Sedation  Instructions: . Oral Intake: Do not eat or drink anything for at least 8 hours prior to your  procedure. . Transportation: Public transportation is not allowed. Bring an adult driver. The driver must be physically present in our waiting room before any procedure can be started. Marland Kitchen Physical  Assistance: Bring an adult physically capable of assisting you, in the event you need help. This adult should keep you company at home for at least 6 hours after the procedure. . Blood Pressure Medicine: Take your blood pressure medicine with a sip of water the morning of the procedure. . Blood thinners: Notify our staff if you are taking any blood thinners. Depending on which one you take, there will be specific instructions on how and when to stop it. . Diabetics on insulin: Notify the staff so that you can be scheduled 1st case in the morning. If your diabetes requires high dose insulin, take only  of your normal insulin dose the morning of the procedure and notify the staff that you have done so. . Preventing infections: Shower with an antibacterial soap the morning of your procedure. . Build-up your immune system: Take 1000 mg of Vitamin C with every meal (3 times a day) the day prior to your procedure. Marland Kitchen Antibiotics: Inform the staff if you have a condition or reason that requires you to take antibiotics before dental procedures. . Pregnancy: If you are pregnant, call and cancel the procedure. . Sickness: If you have a cold, fever, or any active infections, call and cancel the procedure. . Arrival: You must be in the facility at least 30 minutes prior to your scheduled procedure. . Children: Do not bring children with you. . Dress appropriately: Bring dark clothing that you would not mind if they get stained. . Valuables: Do not bring any jewelry or valuables.  Procedure appointments are reserved for interventional treatments only. Marland Kitchen No Prescription Refills. . No medication changes will be discussed during procedure appointments. . No disability issues will be discussed.  Reasons to call and  reschedule or cancel your procedure: (Following these recommendations will minimize the risk of a serious complication.) . Surgeries: Avoid having procedures within 2 weeks of any surgery. (Avoid for 2 weeks before or after any surgery). . Flu Shots: Avoid having procedures within 2 weeks of a flu shots or . (Avoid for 2 weeks before or after immunizations). . Barium: Avoid having a procedure within 7-10 days after having had a radiological study involving the use of radiological contrast. (Myelograms, Barium swallow or enema study). . Heart attacks: Avoid any elective procedures or surgeries for the initial 6 months after a "Myocardial Infarction" (Heart Attack). . Blood thinners: It is imperative that you stop these medications before procedures. Let us know if you if you take any blood thinner.  . Infection: Avoid procedures during or within two weeks of an infection (including chest colds or gastrointestinal problems). Symptoms associated with infections include: Localized redness, fever, chills, night sweats or profuse sweating, burning sensation when voiding, cough, congestion, stuffiness, runny nose, sore throat, diarrhea, nausea, vomiting, cold or Flu symptoms, recent or current infections. It is specially important if the infection is over the area that we intend to treat. Marland Kitchen Heart and lung problems: Symptoms that may suggest an active cardiopulmonary problem include: cough, chest pain, breathing difficulties or shortness of breath, dizziness, ankle swelling, uncontrolled high or unusually low blood pressure, and/or palpitations. If you are experiencing any of these symptoms, cancel your procedure and contact your primary care physician for an evaluation.  Remember:  Regular Business hours are:  Monday to Thursday 8:00 AM to 4:00 PM  Provider's Schedule: Milinda Pointer, MD:  Procedure days: Tuesday and Thursday 7:30 AM to 4:00 PM  Gillis Santa, MD:  Procedure days: Monday and Wednesday  7:30  AM to 4:00 PM ____________________________________________________________________________________________   ____________________________________________________________________________________________  Blood Thinners  Recommended Time Interval Before and After Neuraxial Block or Catheter Removal  Drug (Generic) Brand Name Time Before Time After Comments  Abciximab Reopro 15 days 2 hours   Alteplase Activase 10 days 10 days   Apixaban Eliquis 3 days 6 hours   Aspirin > 325 mg Goody Powders/Excedrin 11 days  (Usually not stopped)  Aspirin ? 81 mg  7 days  (Usually not stopped)  Cholesterol Medication Lipitor 4 days    Cilostazol Pletal 3 days 5 hours   Clopidogrel Plavix 7-10 days 2 hours   Dabigatran Pradaxa 5 days 6 hours   Delteparin Fragmin 24 hours 4 hours   Dipyridamole + ASA Aggrenox 11days 2 hours   Enoxaparin  Lovenox 24 hours 4 hours   Eptifibatide Integrillin 8 hours 2 hours   Fish oil  4 days    Fondaparinux  Arixtra 72 hours 12 hours   Garlic supplements  7 days    Ginkgo biloba  36 hours    Ginseng  24 hours    Heparin (IV)  4 hours 2 hours   Heparin ([place])  12 hours 2 hours   Hydroxychloroquine Plaquenil 11 days    LMW Heparin  24 hours    LMWH  24 hours    NSAIDs  3 days  (Usually not stopped)  Prasugrel Effient 7-10 days 6 hours   Reteplase Retavase 10 days 10 days   Rivaroxaban Xarelto 3 days 6 hours   Streptokinase Streptase 10 days 10 days   Tenecteplase TNKase 10 days 10 days   Thrombolytics  10 days  10 days Avoid x 10 days after inj.  Ticagrelor Brilinta 5-7 days 6 hours   Ticlodipine Ticlid 10-14 days 2 hours   Tinzaparin Innohep 24 hours 4 hours   Tirofiban Aggrastat 8 hours 2 hours   Vitamin E  4 days    Warfarin Coumadin 5 days 2 hours   ____________________________________________________________________________________________  GENERAL RISKS AND COMPLICATIONS  What are the risk, side effects and possible complications? Generally  speaking, most procedures are safe.  However, with any procedure there are risks, side effects, and the possibility of complications.  The risks and complications are dependent upon the sites that are lesioned, or the type of nerve block to be performed.  The closer the procedure is to the spine, the more serious the risks are.  Great care is taken when placing the radio frequency needles, block needles or lesioning probes, but sometimes complications can occur. 1. Infection: Any time there is an injection through the skin, there is a risk of infection.  This is why sterile conditions are used for these blocks.  There are four possible types of infection. 1. Localized skin infection. 2. Central Nervous System Infection-This can be in the form of Meningitis, which can be deadly. 3. Epidural Infections-This can be in the form of an epidural abscess, which can cause pressure inside of the spine, causing compression of the spinal cord with subsequent paralysis. This would require an emergency surgery to decompress, and there are no guarantees that the patient would recover from the paralysis. 4. Discitis-This is an infection of the intervertebral discs.  It occurs in about 1% of discography procedures.  It is difficult to treat and it may lead to surgery.        2. Pain: the needles have to go through skin and soft tissues, will cause soreness.  3. Damage to internal structures:  The nerves to be lesioned may be near blood vessels or    other nerves which can be potentially damaged.       4. Bleeding: Bleeding is more common if the patient is taking blood thinners such as  aspirin, Coumadin, Ticiid, Plavix, etc., or if he/she have some genetic predisposition  such as hemophilia. Bleeding into the spinal canal can cause compression of the spinal  cord with subsequent paralysis.  This would require an emergency surgery to  decompress and there are no guarantees that the patient would recover from the   paralysis.       5. Pneumothorax:  Puncturing of a lung is a possibility, every time a needle is introduced in  the area of the chest or upper back.  Pneumothorax refers to free air around the  collapsed lung(s), inside of the thoracic cavity (chest cavity).  Another two possible  complications related to a similar event would include: Hemothorax and Chylothorax.   These are variations of the Pneumothorax, where instead of air around the collapsed  lung(s), you may have blood or chyle, respectively.       6. Spinal headaches: They may occur with any procedures in the area of the spine.       7. Persistent CSF (Cerebro-Spinal Fluid) leakage: This is a rare problem, but may occur  with prolonged intrathecal or epidural catheters either due to the formation of a fistulous  track or a dural tear.       8. Nerve damage: By working so close to the spinal cord, there is always a possibility of  nerve damage, which could be as serious as a permanent spinal cord injury with  paralysis.       9. Death:  Although rare, severe deadly allergic reactions known as "Anaphylactic  reaction" can occur to any of the medications used.      10. Worsening of the symptoms:  We can always make thing worse.  What are the chances of something like this happening? Chances of any of this occuring are extremely low.  By statistics, you have more of a chance of getting killed in a motor vehicle accident: while driving to the hospital than any of the above occurring .  Nevertheless, you should be aware that they are possibilities.  In general, it is similar to taking a shower.  Everybody knows that you can slip, hit your head and get killed.  Does that mean that you should not shower again?  Nevertheless always keep in mind that statistics do not mean anything if you happen to be on the wrong side of them.  Even if a procedure has a 1 (one) in a 1,000,000 (million) chance of going wrong, it you happen to be that one..Also, keep in mind  that by statistics, you have more of a chance of having something go wrong when taking medications.  Who should not have this procedure? If you are on a blood thinning medication (e.g. Coumadin, Plavix, see list of "Blood Thinners"), or if you have an active infection going on, you should not have the procedure.  If you are taking any blood thinners, please inform your physician.  How should I prepare for this procedure?  Do not eat or drink anything at least six hours prior to the procedure.  Bring a driver with you .  It cannot be a taxi.  Come accompanied by an adult that can drive you back, and  that is strong enough to help you if your legs get weak or numb from the local anesthetic.  Take all of your medicines the morning of the procedure with just enough water to swallow them.  If you have diabetes, make sure that you are scheduled to have your procedure done first thing in the morning, whenever possible.  If you have diabetes, take only half of your insulin dose and notify our nurse that you have done so as soon as you arrive at the clinic.  If you are diabetic, but only take blood sugar pills (oral hypoglycemic), then do not take them on the morning of your procedure.  You may take them after you have had the procedure.  Do not take aspirin or any aspirin-containing medications, at least eleven (11) days prior to the procedure.  They may prolong bleeding.  Wear loose fitting clothing that may be easy to take off and that you would not mind if it got stained with Betadine or blood.  Do not wear any jewelry or perfume  Remove any nail coloring.  It will interfere with some of our monitoring equipment.  NOTE: Remember that this is not meant to be interpreted as a complete list of all possible complications.  Unforeseen problems may occur.  BLOOD THINNERS The following drugs contain aspirin or other products, which can cause increased bleeding during surgery and should not be  taken for 2 weeks prior to and 1 week after surgery.  If you should need take something for relief of minor pain, you may take acetaminophen which is found in Tylenol,m Datril, Anacin-3 and Panadol. It is not blood thinner. The products listed below are.  Do not take any of the products listed below in addition to any listed on your instruction sheet.  A.P.C or A.P.C with Codeine Codeine Phosphate Capsules #3 Ibuprofen Ridaura  ABC compound Congesprin Imuran rimadil  Advil Cope Indocin Robaxisal  Alka-Seltzer Effervescent Pain Reliever and Antacid Coricidin or Coricidin-D  Indomethacin Rufen  Alka-Seltzer plus Cold Medicine Cosprin Ketoprofen S-A-C Tablets  Anacin Analgesic Tablets or Capsules Coumadin Korlgesic Salflex  Anacin Extra Strength Analgesic tablets or capsules CP-2 Tablets Lanoril Salicylate  Anaprox Cuprimine Capsules Levenox Salocol  Anexsia-D Dalteparin Magan Salsalate  Anodynos Darvon compound Magnesium Salicylate Sine-off  Ansaid Dasin Capsules Magsal Sodium Salicylate  Anturane Depen Capsules Marnal Soma  APF Arthritis pain formula Dewitt's Pills Measurin Stanback  Argesic Dia-Gesic Meclofenamic Sulfinpyrazone  Arthritis Bayer Timed Release Aspirin Diclofenac Meclomen Sulindac  Arthritis pain formula Anacin Dicumarol Medipren Supac  Analgesic (Safety coated) Arthralgen Diffunasal Mefanamic Suprofen  Arthritis Strength Bufferin Dihydrocodeine Mepro Compound Suprol  Arthropan liquid Dopirydamole Methcarbomol with Aspirin Synalgos  ASA tablets/Enseals Disalcid Micrainin Tagament  Ascriptin Doan's Midol Talwin  Ascriptin A/D Dolene Mobidin Tanderil  Ascriptin Extra Strength Dolobid Moblgesic Ticlid  Ascriptin with Codeine Doloprin or Doloprin with Codeine Momentum Tolectin  Asperbuf Duoprin Mono-gesic Trendar  Aspergum Duradyne Motrin or Motrin IB Triminicin  Aspirin plain, buffered or enteric coated Durasal Myochrisine Trigesic  Aspirin Suppositories Easprin Nalfon  Trillsate  Aspirin with Codeine Ecotrin Regular or Extra Strength Naprosyn Uracel  Atromid-S Efficin Naproxen Ursinus  Auranofin Capsules Elmiron Neocylate Vanquish  Axotal Emagrin Norgesic Verin  Azathioprine Empirin or Empirin with Codeine Normiflo Vitamin E  Azolid Emprazil Nuprin Voltaren  Bayer Aspirin plain, buffered or children's or timed BC Tablets or powders Encaprin Orgaran Warfarin Sodium  Buff-a-Comp Enoxaparin Orudis Zorpin  Buff-a-Comp with Codeine Equegesic Os-Cal-Gesic   Buffaprin Excedrin plain, buffered  or Extra Strength Oxalid   Bufferin Arthritis Strength Feldene Oxphenbutazone   Bufferin plain or Extra Strength Feldene Capsules Oxycodone with Aspirin   Bufferin with Codeine Fenoprofen Fenoprofen Pabalate or Pabalate-SF   Buffets II Flogesic Panagesic   Buffinol plain or Extra Strength Florinal or Florinal with Codeine Panwarfarin   Buf-Tabs Flurbiprofen Penicillamine   Butalbital Compound Four-way cold tablets Penicillin   Butazolidin Fragmin Pepto-Bismol   Carbenicillin Geminisyn Percodan   Carna Arthritis Reliever Geopen Persantine   Carprofen Gold's salt Persistin   Chloramphenicol Goody's Phenylbutazone   Chloromycetin Haltrain Piroxlcam   Clmetidine heparin Plaquenil   Cllnoril Hyco-pap Ponstel   Clofibrate Hydroxy chloroquine Propoxyphen         Before stopping any of these medications, be sure to consult the physician who ordered them.  Some, such as Coumadin (Warfarin) are ordered to prevent or treat serious conditions such as "deep thrombosis", "pumonary embolisms", and other heart problems.  The amount of time that you may need off of the medication may also vary with the medication and the reason for which you were taking it.  If you are taking any of these medications, please make sure you notify your pain physician before you undergo any procedures.         Pain Management Discharge Instructions  General Discharge Instructions :  If you need  to reach your doctor call: Monday-Friday 8:00 am - 4:00 pm at [phone] or toll free [phone].  After clinic hours [phone] to have operator reach doctor.  Bring all of your medication bottles to all your appointments in the pain clinic.  To cancel or reschedule your appointment with Pain Management please remember to call 24 hours in advance to avoid a fee.  Refer to the educational materials which you have been given on: General Risks, I had my Procedure. Discharge Instructions, Post Sedation.  Post Procedure Instructions:  The drugs you were given will stay in your system until tomorrow, so for the next 24 hours you should not drive, make any legal decisions or drink any alcoholic beverages.  You may eat anything you prefer, but it is better to start with liquids then soups and crackers, and gradually work up to solid foods.  Please notify your doctor immediately if you have any unusual bleeding, trouble breathing or pain that is not related to your normal pain.  Depending on the type of procedure that was done, some parts of your body may feel week and/or numb.  This usually clears up by tonight or the next day.  Walk with the use of an assistive device or accompanied by an adult for the 24 hours.  You may use ice on the affected area for the first 24 hours.  Put ice in a Ziploc bag and cover with a towel and place against area 15 minutes on 15 minutes off.  You may switch to heat after 24 hours.Lumbar Sympathetic Block Patient Information  Description: The lumbar plexus is a group of nerves that are part of the sympathetic nervous system.  These nerves supply organs in the pelvis and legs.  Lumbar sympathetic blocks are utilized for the diagnosis and treatment of painful conditions in these areas.   The lumbar plexus is located on both sides of the aorta at approximately the level of the second lumbar vertebral body.  The block will be performed with you lying on your  abdomen with a pillow underneath.  Using direct x-ray guidance,   The plexus will be located  on both sides of the spine.  Numbing medicine will be used to deaden the skin prior to needle insertion.  In most cases, a small amount of sedation can be give by IV prior to the numbing medicine.  One or two small needles will be placed near the plexus and local anesthetic will be injected.  This may make your leg(s) feel warm.  The Entire block usually lasts about 15-25 minutes.  Conditions which may be treated by lumbar sympathetic block:   Reflex sympathetic dystrophy  Phantom limb pain  Peripheral neuropathy  Peripheral vascular disease ( inadequate blood flow )  Cancer pain of pelvis, leg and kidney  Preparation for the injection:  1. Do note eat any solid food or diary products within 8 hours of your appointment. 2. You may drink clear liquids up to 3 hours before appointment.  Clear liquids include water, black coffee, juice or soda.  No milk or cream please. 3. You may take your regular medication, including pain medications, with a sip of water before you appointment.  Diabetics should hold regular insulin ( if taken separately ) and take 1/2 NPH dose the morning of the procedure .  Carry some sugar containing items with you to your appointment. 4. A driver must accompany you and be prepared to drive you home after your procedure. 5. Bring all your current medication with you. 6. An IV may be inserted and sedation may be given at the discretion of the physician.  7. A blood pressure cuff, EKG and other monitors will often be applied during the procedure.  Some patients may need to have extra oxygen administered for a short period. 8. You will be asked to provide medical information, including your allergies and medications, prior to the procedure.  We must know immediately if your taking blood thinners (like Coumadin/Warfarin) or if you are allergic to IV iodine contrast (dye).  We must know  if you could possibly be pregnant.  Possible side-effects   Bleeding from needle site or deeper  Infection (rare, can require surgery)  Nerve injury (rare)  Numbness & tingling (temporary)  Collapsed lung (rare)  Spinal headache (a headache worse with upright posture)  Light-headedness (temporary)  Pain at injection site (several days)  Decreased blood pressure (temporary)  Weakness in legs (temporary)  Seizure or other drug reaction (rare)  Call if you experience:   Fever/chills associated with headache or increased back/ neck pain  Headache worsened by an upright position  New onset weakness or numbness of an extremity below the injection site  Hives or difficulty breathing ( go to the emergency room)  Inflammation or drainage at the injections site(s)  New symptoms which are concerning to you  Please note:  If effective, we will often do a series of 2-3 injections spaced 3-6 weeks apart to maximally decrease your pain.  If initial series is effective, you may be a candidate for a more permanent block of the lumbar sympathetic plexus.  If you have any questions please call [phone] [facility] ____________________________________________________________________________________________  Blood Thinners  Recommended Time Interval Before and After Neuraxial Block or Catheter Removal  Drug (Generic) Brand Name Time Before Time After Comments  Abciximab Reopro 15 days 2 hours   Alteplase Activase 10 days 10 days   Apixaban Eliquis 3 days 6 hours   Aspirin > 325 mg Goody Powders/Excedrin 11 days  (Usually not stopped)  Aspirin ? 81 mg  7 days  (Usually not  stopped)  Cholesterol Medication Lipitor 4 days    Cilostazol Pletal 3 days 5 hours   Clopidogrel Plavix 7-10 days 2 hours   Dabigatran Pradaxa 5 days 6 hours   Delteparin Fragmin 24 hours 4 hours   Dipyridamole + ASA Aggrenox 11days 2 hours   Enoxaparin  Lovenox 24  hours 4 hours   Eptifibatide Integrillin 8 hours 2 hours   Fish oil  4 days    Fondaparinux  Arixtra 72 hours 12 hours   Garlic supplements  7 days    Ginkgo biloba  36 hours    Ginseng  24 hours    Heparin (IV)  4 hours 2 hours   Heparin ([place])  12 hours 2 hours   Hydroxychloroquine Plaquenil 11 days    LMW Heparin  24 hours    LMWH  24 hours    NSAIDs  3 days  (Usually not stopped)  Prasugrel Effient 7-10 days 6 hours   Reteplase Retavase 10 days 10 days   Rivaroxaban Xarelto 3 days 6 hours   Streptokinase Streptase 10 days 10 days   Tenecteplase TNKase 10 days 10 days   Thrombolytics  10 days  10 days Avoid x 10 days after inj.  Ticagrelor Brilinta 5-7 days 6 hours   Ticlodipine Ticlid 10-14 days 2 hours   Tinzaparin Innohep 24 hours 4 hours   Tirofiban Aggrastat 8 hours 2 hours   Vitamin E  4 days    Warfarin Coumadin 5 days 2 hours   ____________________________________________________________________________________________  Radiofrequency Lesioning Radiofrequency lesioning is a procedure that is performed to relieve pain. The procedure is often used for back, neck, or arm pain. Radiofrequency lesioning involves the use of a machine that creates radio waves to make heat. During the procedure, the heat is applied to the nerve that carries the pain signal. The heat damages the nerve and interferes with the pain signal. Pain relief usually starts about 2 weeks after the procedure and lasts for 6 months to 1 year. Tell a health care provider about:  Any allergies you have.  All medicines you are taking, including vitamins, herbs, eye drops, creams, and over-the-counter medicines.  Any problems you or family members have had with anesthetic medicines.  Any blood disorders you have.  Any surgeries you have had.  Any medical conditions you have.  Whether you are pregnant or may be pregnant. What are the risks? Generally, this is a safe procedure. However, problems may occur,  including:  Pain or soreness at the injection site.  Infection at the injection site.  Damage to nerves or blood vessels.  What happens before the procedure?  Ask your health care provider about: ? Changing or stopping your regular medicines. This is especially important if you are taking diabetes medicines or blood thinners. ? Taking medicines such as aspirin and ibuprofen. These medicines can thin your blood. Do not take these medicines before your procedure if your health care provider instructs you not to.  Follow instructions from your health care provider about eating or drinking restrictions.  Plan to have someone take you home after the procedure.  If you go home right after the procedure, plan to have someone with you for 24 hours. What happens during the procedure?  You will be given one or more of the following: ? A medicine to help you relax (sedative). ? A medicine to numb the area (local anesthetic).  You will be awake during the procedure. You will need to be able  to talk with the health care provider during the procedure.  With the help of a type of X-ray (fluoroscopy), the health care provider will insert a radiofrequency needle into the area to be treated.  Next, a wire that carries the radio waves (electrode) will be put through the radiofrequency needle. An electrical pulse will be sent through the electrode to verify the correct nerve. You will feel a tingling sensation, and you may have muscle twitching.  Then, the tissue that is around the needle tip will be heated by an electric current that is passed using the radiofrequency machine. This will numb the nerves.  A bandage (dressing) will be put on the insertion area after the procedure is done. The procedure may vary among health care providers and hospitals. What happens after the procedure?  Your blood pressure, heart rate, breathing rate, and blood oxygen level will be monitored often until the  medicines you were given have worn off.  Return to your normal activities as directed by your health care provider. This information is not intended to replace advice given to you by your health care provider. Make sure you discuss any questions you have with your health care provider. Document Released: 09/19/2010 Document Revised: 06/29/2015 Document Reviewed: 02/28/2014 [employer] Interactive Patient Education  [employer].

## 2017-12-15 NOTE — Progress Notes (Signed)
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 Second: Topical Lidocaine 4% cream applied to wound bed prior to debridement (In Clinic Only). Wound #3  Left,Lateral Toe Fifth: Topical Lidocaine 4% cream applied to wound bed prior to debridement (In Clinic Only). Wound #4 Left,Medial,Dorsal Foot: Topical Lidocaine 4% cream applied to wound bed prior to debridement (In Clinic Only). Primary Wound Dressing: Other: - non adherent pad and coverlet or bandaid on the blisters on right dorsal foot and left medial foot Wound #1 Right Toe Second: Silver Alginate Wound #3 Left,Lateral Toe Fifth: Silver Alginate - silver alginate between toes also Wound #4 Left,Medial,Dorsal Foot: Iodoflex Secondary Dressing: Wound #1 Right Toe Second: Other - Coverlet Wound #3 Left,Lateral Toe Fifth: Gauze and Kerlix/Conform Wound #4 Left,Medial,Dorsal Foot: [place], Mayo S. ([id number]) Gauze and Kerlix/Conform Dressing Change Frequency: Wound #1 Right Toe Second: Change Dressing Monday, Wednesday, Friday - HOMEHEALTH-Monday, Wednesday, Friday Wound #3 Left,Lateral Toe Fifth: Change Dressing Monday, Wednesday, Friday - HOMEHEALTH-Monday, Wednesday, Friday Wound #4 Left,Medial,Dorsal Foot: Change dressing every day. - HHRN to change dressing three times weekly and [facility] will change dressing once and family will attempt to change dressing the other days Follow-up Appointments: Wound #1 Right Toe Second: Return Appointment in 2 weeks. Wound #3 Left,Lateral Toe Fifth: Return Appointment in 2 weeks. Wound #4 Left,Medial,Dorsal Foot: Return Appointment in 2 weeks. Wound #5 Left Toe Third: Return Appointment in 2 weeks. Home Health: Wound #1 Right Toe Second: Continue Home Health Visits - [facility]: Monday, Wednesday and Friday Home Health Nurse may visit PRN to address patient s wound care needs. FACE TO FACE ENCOUNTER: MEDICARE and MEDICAID PATIENTS: I certify that this patient is under my care and that I had a face-to-face encounter that meets the physician face-to-face encounter requirements with this patient on this date.  The encounter with the patient was in whole or in part for the following MEDICAL CONDITION: (primary reason for [place]) MEDICAL NECESSITY: I certify, that based on my findings, NURSING services are a medically necessary home health service. HOME BOUND STATUS: I certify that my clinical findings support that this patient is homebound (i.e., Due to illness or injury, pt requires aid of supportive devices such as crutches, cane, wheelchairs, walkers, the use of special transportation or the assistance of another person to leave their place of residence. There is a normal inability to leave the home and doing so requires considerable and taxing effort. Other absences are for medical reasons / religious services and are infrequent or of short duration when for other reasons). If current dressing causes regression in wound condition, may D/C ordered dressing product/s and apply Normal Saline Moist Dressing daily until next [place] / Other MD appointment. [place] of regression in wound condition at [phone]. Please direct any NON-WOUND related issues/requests for orders to patient's Primary Care Physician Wound #3 Left,Lateral Toe Fifth: [place] Visits - [facility]: Monday, Wednesday and Friday Home Health Nurse  may visit PRN to address patient s wound care needs. FACE TO FACE ENCOUNTER: MEDICARE and MEDICAID PATIENTS: I certify that this patient is under my care and that I had a face-to-face encounter that meets the physician face-to-face encounter requirements with this patient on this date. The encounter with the patient was in whole or in part for the following MEDICAL CONDITION: (primary reason for [place]) MEDICAL NECESSITY: I certify, that based on my findings, NURSING services are a medically necessary home health service. HOME BOUND STATUS: I certify that my clinical findings support that this patient is homebound (i.e., Due  to illness or injury, pt requires aid of supportive devices such as crutches, cane, wheelchairs, walkers, the use of special transportation or the assistance of another person to leave their place of residence. There is a normal inability to leave the home and doing so requires considerable and taxing effort. Other absences are for medical reasons / religious services and are infrequent or of short duration when for other reasons). If current dressing causes regression in wound condition, may D/C ordered dressing product/s and apply Normal Saline Moist Dressing daily until next [place] / Other MD appointment. [place] of regression in wound condition at [phone]. Please direct any NON-WOUND related issues/requests for orders to patient's Primary Care Physician Wound #4 Left,Medial,Dorsal Foot: [place] Visits - [facility]: Monday, Wednesday and Friday Home Health Nurse may visit PRN to address patient s wound care needs. FACE TO FACE ENCOUNTER: MEDICARE and MEDICAID PATIENTS: I certify that this patient is under my care and that I had a face-to-face encounter that meets the physician face-to-face encounter requirements with this patient on this date. The encounter with the patient was in whole or in part for the following MEDICAL CONDITION: (primary reason for [place]) MEDICAL NECESSITY: I certify, that based on my findings, NURSING services are a medically necessary home health service. HOME BOUND STATUS: I certify that my clinical findings support that this patient is homebound (i.e., Due to illness or injury, pt requires aid of supportive devices such as crutches, cane, wheelchairs, walkers, the use of special [place], Sher S. ([id number]) transportation or the assistance of another person to leave their place of residence. There is a normal inability to leave the home and doing so requires considerable and taxing effort. Other absences  are for medical reasons / religious services and are infrequent or of short duration when for other reasons). If current dressing causes regression in wound condition, may D/C ordered dressing product/s and apply Normal Saline Moist Dressing daily until next [place] / Other MD appointment. [place] of regression in wound condition at [phone]. Please direct any NON-WOUND related issues/requests for orders to patient's Primary Care Physician My suggestion at this point is going to be that we attempt to perform good wound care for the patient at this time knowing that the optimal treatment would likely be amputation seeing how it is unlikely that the wounds are gonna heal. This was discussed with patient as well as his daughter Kathlee Nations previously. I spoke with Kathlee Nations last week and the patient very specifically about this today. He knows what to look for as far as fever, chills, nausea, vomiting, diarrhea, or any other unusual symptoms that may occur which I would like for him to go to the ER concerning as soon as possible if they do occur. I explained that he could just have a virus or something else but nonetheless  with what he is going on with his feet I do not want him taking chances. He states he understands. We are gonna subsequently see him back for reevaluation in two weeks time to see were things stand. If anything changes in the meantime he will let me know. Please see above for specific wound care orders. We will see patient for re-evaluation in 2 week(s) here in the clinic. If anything worsens or changes patient will contact our office for additional recommendations. Electronic Signature(s) Signed: 12/11/2017 5:47:48 PM By: Worthy Keeler PA-C Entered By: Worthy Keeler on 12/11/2017 17:31:32 [place], [name] ([id number]) -------------------------------------------------------------------------------- ROS/PFSH Details Patient Name: Barletta, Gearold  S. Date of Service: 12/11/2017 11:15 AM Medical Record Number: [id number] Patient Account Number: [id number] Date of Birth/Sex: August 16, 1924 (84 y.o. Male) Treating RN: Montey Hora Primary Care Provider: Deborra Medina Other Clinician: Referring Provider: Deborra Medina Treating Provider/Extender: Melburn Hake, HOYT Weeks in Treatment: 8 Information Obtained From Patient Wound History Constitutional Symptoms (General Health) Complaints and Symptoms: Negative for: Fever; Chills Cardiovascular Complaints and Symptoms: Positive for: LE edema Medical History: Positive for: Coronary Artery Disease; Hypertension; Peripheral Venous Disease Negative for: Angina; Arrhythmia; Congestive Heart Failure; Deep Vein Thrombosis; Hypotension; Myocardial Infarction; Peripheral Arterial Disease; Phlebitis; Vasculitis Past Medical History Notes: pacemaker, 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 leg, stent left leg Eyes Medical History: Positive for: Cataracts Negative for: Glaucoma; Optic Neuritis Ear/Nose/Mouth/Throat Medical History: Negative for: Chronic sinus problems/congestion; Middle ear problems Hematologic/Lymphatic Medical History: Positive for: Lymphedema Negative for: Anemia; Hemophilia; Human Immunodeficiency Virus; Sickle Cell Disease Respiratory Complaints and Symptoms: No Complaints or Symptoms Medical History: Negative for: Aspiration; Asthma; Chronic Obstructive Pulmonary Disease (COPD); Pneumothorax; Sleep Apnea; Tuberculosis Gastrointestinal Ontiveros, McCarr ([id number]) Medical History: Negative for: Cirrhosis ; Colitis; Crohnos; Hepatitis A; Hepatitis B; Hepatitis C Endocrine Medical History: Positive for: Type II Diabetes - since 1988 Negative for: Type I Diabetes Treated with: Insulin Blood sugar tested every day: Yes Tested : 3 times daily Blood sugar testing results: Breakfast: 114 Genitourinary Medical History: Positive for: End Stage Renal Disease Immunological Medical  History: Negative for: Lupus Erythematosus; Raynaudos; Scleroderma Integumentary (Skin) Medical History: Negative for: History of Burn; History of pressure wounds Musculoskeletal Medical History: Positive for: Rheumatoid Arthritis Negative for: Gout; Osteoarthritis; Osteomyelitis Neurologic Medical History: Positive for: Neuropathy - feet and legs Negative for: Dementia; Quadriplegia; Paraplegia; Seizure Disorder Psychiatric Complaints and Symptoms: No Complaints or Symptoms Medical History: Negative for: Anorexia/bulimia; Confinement Anxiety HBO Extended History Items Eyes: Cataracts Immunizations Pneumococcal Vaccine: Received Pneumococcal Vaccination: No Implantable Devices Ch[place], TASMAN ZAPATA ([id number]) Family and Social History Former smoker; Marital Status - Widowed; Alcohol Use: Rarely; Drug Use: No History; Caffeine Use: Never; Financial Concerns: No; Food, Clothing or Shelter Needs: No; Support System Lacking: No; Transportation Concerns: No; Advanced Directives: Yes (Not Provided); Patient does not want information on Advanced Directives; Do not resuscitate: No; Living Will: Yes (Not Provided); Medical Power of Attorney: Yes (Not Provided) Physician Affirmation I have reviewed and agree with the above information. Electronic Signature(s) Signed: 12/11/2017 5:47:48 PM By: Worthy Keeler PA-C Signed: 12/12/2017 5:34:16 PM By: Montey Hora Entered By: Worthy Keeler on 12/11/2017 17:29:45 [place], [name] ([id number]) -------------------------------------------------------------------------------- [place] Details Patient Name: Schlack, Andrei S. Date of Service: 12/11/2017 Medical Record Number: [id number] Patient Account Number: [id number] Date of Birth/Sex: May 07, 1924 (84 y.o. 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 N18.9 Chronic kidney disease, unspecified I73.9 Peripheral vascular disease, unspecified D63.87 Chronic systolic (congestive) heart failure I10 Essential (primary) hypertension Facility Procedures CPT4 Code: 56433295 Description: 18841 - WOUND CARE VISIT-LEV 5 EST PT Modifier: Quantity: 1 Physician Procedures CPT4: Description Modifier Quantity Code 6606301 60109 - WC PHYS LEVEL 4 - EST PT 1 ICD-10 Diagnosis Description E11.621 Type 2 diabetes mellitus with foot ulcer E11.40 Type 2 diabetes mellitus with diabetic neuropathy, unspecified L97.512 Non-pressure  chronic ulcer of other part of right foot with fat layer exposed 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 Electronic Signature(s) Signed: 12/11/2017 5:47:48 PM By: Worthy Keeler PA-C Previous Signature: 12/11/2017 12:42:08 PM Version By: Montey Hora Entered By: Worthy Keeler on 12/11/2017 17:32:25

## 2017-12-15 NOTE — Progress Notes (Signed)
Safety precautions to be maintained throughout the outpatient stay will include: orient to surroundings, keep bed in low position, maintain call bell within reach at all times, provide assistance with transfer out of bed and ambulation.  

## 2017-12-16 ENCOUNTER — Telehealth: Payer: Self-pay | Admitting: Pain Medicine

## 2017-12-16 NOTE — Telephone Encounter (Signed)
Spoke with Kathlee Nations, advised to stop ASA 11 days and Plavix 7 days.

## 2017-12-16 NOTE — Telephone Encounter (Signed)
Pts daughter called and had some questions about how long her father needed to be off his medication for the procedure and if he had to stay off between procedures.

## 2017-12-17 ENCOUNTER — Telehealth: Payer: Self-pay | Admitting: Pain Medicine

## 2017-12-17 ENCOUNTER — Telehealth: Payer: Self-pay | Admitting: *Deleted

## 2017-12-17 NOTE — Telephone Encounter (Signed)
Attempted to call [name], message left.

## 2017-12-17 NOTE — Telephone Encounter (Signed)
Daughter has some questions about Patients medications for upcoming procedure appt next week. She thinks there are some he needs to stop. Lipitor, aspirin, advil, and amitriptyline.

## 2017-12-17 NOTE — Telephone Encounter (Signed)
Patients daughter reports that he stopped Plavix on yesterday which is 8 days before procedure and has also stopped ASA 325 mg on Monday 12/15/17. D/cing Plavix was okayed by Cardiologist,  Dr Saralyn Pilar.  Spoke with daughter at length re; procedure and expetations and time frame that may follow in terms of f/up and repeating procedure.  BP medicines discussed as well as insulin.  Patient verbalizes u/o of information given.

## 2017-12-18 ENCOUNTER — Telehealth: Payer: Self-pay | Admitting: *Deleted

## 2017-12-18 ENCOUNTER — Encounter: Payer: Medicare Other | Admitting: Physician Assistant

## 2017-12-18 ENCOUNTER — Ambulatory Visit: Payer: Self-pay

## 2017-12-18 ENCOUNTER — Telehealth: Payer: Self-pay | Admitting: Pain Medicine

## 2017-12-18 DIAGNOSIS — E11621 Type 2 diabetes mellitus with foot ulcer: Secondary | ICD-10-CM | POA: Diagnosis not present

## 2017-12-18 DIAGNOSIS — I70203 Unspecified atherosclerosis of native arteries of extremities, bilateral legs: Secondary | ICD-10-CM | POA: Diagnosis not present

## 2017-12-18 DIAGNOSIS — E1152 Type 2 diabetes mellitus with diabetic peripheral angiopathy with gangrene: Secondary | ICD-10-CM | POA: Diagnosis not present

## 2017-12-18 DIAGNOSIS — L97513 Non-pressure chronic ulcer of other part of right foot with necrosis of muscle: Secondary | ICD-10-CM | POA: Diagnosis not present

## 2017-12-18 DIAGNOSIS — Z794 Long term (current) use of insulin: Secondary | ICD-10-CM | POA: Diagnosis not present

## 2017-12-18 DIAGNOSIS — I10 Essential (primary) hypertension: Secondary | ICD-10-CM | POA: Diagnosis not present

## 2017-12-18 NOTE — Telephone Encounter (Signed)
Vista Deck, PTA with [facility] called and says "I came out to do physical therapy on Mr. Russett and he says the medicine that was prescribed for sleep is not helping, the amitriptyline. He said he asked for something to help him sleep and to help with his nerve pain in his legs. He says he hasn't been sleeping. I noticed when I got him up to walk, he only walked about 50 ft, then had to sit down and rest. He was slightly SOB, so the nurse came and checked his BP 159/73, O2 sat 99%. He normally will walk around 200 feet. He is thinking this has to do with his medicine and not getting sleep. I told him I would call to report it." I asked overall how did the patient look, he says "he was fine, doing great. He just has decreased stamina, not wanting to do much and slightly SOB." I advised I will send this over to the provider for review. He says if there are any changes to medications, to fax it to [facility] in [place] and the fax # [phone].  Reason for Disposition . Caller has NON-URGENT medication question about med that PCP prescribed and triager unable to answer question  Protocols used: MEDICATION QUESTION CALL-A-AH

## 2017-12-18 NOTE — Telephone Encounter (Signed)
No answer. LVM ?

## 2017-12-18 NOTE — Telephone Encounter (Signed)
He can reduce the amitryptiline to 25 mg if it is not helping the legs.  [place] am heading to the airport now so I will not be able to respond any more until I return.  Please forward any other issues to Dr Nicki Reaper or Dr Joycelyn Rua,   Deborra Medina, MD

## 2017-12-18 NOTE — Telephone Encounter (Signed)
Patient advised ok to reduce amitriptyline 25 mg , he verbalized understanding.

## 2017-12-18 NOTE — Telephone Encounter (Signed)
Patient is scheduled for procedure next week and daughter Arvid Right has a few more questions. Please all her at [phone]

## 2017-12-18 NOTE — Telephone Encounter (Signed)
Daughter Kathlee Nations asking if ok to take Lipitor prior to procedure. Informed her he can take Lipitor prior to procedure.

## 2017-12-18 NOTE — Telephone Encounter (Signed)
Spoke with Dr Derrel Nip she stated that patient could increase amitriptyline to 50mg  . Patient states he is already taking Amtriptyline  50mg  . He states that his legs began to feel weaker since increasing to 50 mg.   He wonders if he can just decrease dose back 25 mg? He denies shortness of breath just feels like muscles in legs are weaker . Denies shortness of breath , he states just more tired due to leg muscles weaker.  Patient is aware Dr Derrel Nip is out of office would like her advice.

## 2017-12-20 DIAGNOSIS — E11621 Type 2 diabetes mellitus with foot ulcer: Secondary | ICD-10-CM | POA: Diagnosis not present

## 2017-12-20 DIAGNOSIS — E1152 Type 2 diabetes mellitus with diabetic peripheral angiopathy with gangrene: Secondary | ICD-10-CM | POA: Diagnosis not present

## 2017-12-20 DIAGNOSIS — L97513 Non-pressure chronic ulcer of other part of right foot with necrosis of muscle: Secondary | ICD-10-CM | POA: Diagnosis not present

## 2017-12-20 DIAGNOSIS — I10 Essential (primary) hypertension: Secondary | ICD-10-CM | POA: Diagnosis not present

## 2017-12-20 DIAGNOSIS — Z794 Long term (current) use of insulin: Secondary | ICD-10-CM | POA: Diagnosis not present

## 2017-12-20 DIAGNOSIS — I70203 Unspecified atherosclerosis of native arteries of extremities, bilateral legs: Secondary | ICD-10-CM | POA: Diagnosis not present

## 2017-12-21 ENCOUNTER — Encounter: Payer: Self-pay | Admitting: Pain Medicine

## 2017-12-22 DIAGNOSIS — Z794 Long term (current) use of insulin: Secondary | ICD-10-CM | POA: Diagnosis not present

## 2017-12-22 DIAGNOSIS — I70203 Unspecified atherosclerosis of native arteries of extremities, bilateral legs: Secondary | ICD-10-CM | POA: Diagnosis not present

## 2017-12-22 DIAGNOSIS — E1152 Type 2 diabetes mellitus with diabetic peripheral angiopathy with gangrene: Secondary | ICD-10-CM | POA: Diagnosis not present

## 2017-12-22 DIAGNOSIS — L97513 Non-pressure chronic ulcer of other part of right foot with necrosis of muscle: Secondary | ICD-10-CM | POA: Diagnosis not present

## 2017-12-22 DIAGNOSIS — I10 Essential (primary) hypertension: Secondary | ICD-10-CM | POA: Diagnosis not present

## 2017-12-22 DIAGNOSIS — E11621 Type 2 diabetes mellitus with foot ulcer: Secondary | ICD-10-CM | POA: Diagnosis not present

## 2017-12-23 ENCOUNTER — Other Ambulatory Visit: Payer: Self-pay | Admitting: Internal Medicine

## 2017-12-23 DIAGNOSIS — Z794 Long term (current) use of insulin: Secondary | ICD-10-CM | POA: Diagnosis not present

## 2017-12-23 DIAGNOSIS — L97513 Non-pressure chronic ulcer of other part of right foot with necrosis of muscle: Secondary | ICD-10-CM | POA: Diagnosis not present

## 2017-12-23 DIAGNOSIS — E11621 Type 2 diabetes mellitus with foot ulcer: Secondary | ICD-10-CM | POA: Diagnosis not present

## 2017-12-23 DIAGNOSIS — I70203 Unspecified atherosclerosis of native arteries of extremities, bilateral legs: Secondary | ICD-10-CM | POA: Diagnosis not present

## 2017-12-23 DIAGNOSIS — I10 Essential (primary) hypertension: Secondary | ICD-10-CM | POA: Diagnosis not present

## 2017-12-23 DIAGNOSIS — E1152 Type 2 diabetes mellitus with diabetic peripheral angiopathy with gangrene: Secondary | ICD-10-CM | POA: Diagnosis not present

## 2017-12-24 DIAGNOSIS — E11621 Type 2 diabetes mellitus with foot ulcer: Secondary | ICD-10-CM | POA: Diagnosis not present

## 2017-12-24 DIAGNOSIS — I10 Essential (primary) hypertension: Secondary | ICD-10-CM | POA: Diagnosis not present

## 2017-12-24 DIAGNOSIS — L97513 Non-pressure chronic ulcer of other part of right foot with necrosis of muscle: Secondary | ICD-10-CM | POA: Diagnosis not present

## 2017-12-24 DIAGNOSIS — E1152 Type 2 diabetes mellitus with diabetic peripheral angiopathy with gangrene: Secondary | ICD-10-CM | POA: Diagnosis not present

## 2017-12-24 DIAGNOSIS — Z794 Long term (current) use of insulin: Secondary | ICD-10-CM | POA: Diagnosis not present

## 2017-12-24 DIAGNOSIS — I70203 Unspecified atherosclerosis of native arteries of extremities, bilateral legs: Secondary | ICD-10-CM | POA: Diagnosis not present

## 2017-12-25 ENCOUNTER — Ambulatory Visit
Admission: RE | Admit: 2017-12-25 | Discharge: 2017-12-25 | Disposition: A | Discharge status: Home / Self Care | Payer: Medicare Other | Source: Ambulatory Visit | Attending: Pain Medicine | Admitting: Pain Medicine

## 2017-12-25 ENCOUNTER — Ambulatory Visit (HOSPITAL_BASED_OUTPATIENT_CLINIC_OR_DEPARTMENT_OTHER): Payer: Medicare Other | Admitting: Pain Medicine

## 2017-12-25 ENCOUNTER — Encounter: Payer: Self-pay | Admitting: Pain Medicine

## 2017-12-25 ENCOUNTER — Other Ambulatory Visit: Payer: Self-pay

## 2017-12-25 ENCOUNTER — Ambulatory Visit: Payer: Medicare Other | Admitting: Physician Assistant

## 2017-12-25 VITALS — BP 148/54 | HR 76 | Temp 98.3°F | Resp 13 | Ht 68.0 in | Wt 166.0 lb

## 2017-12-25 DIAGNOSIS — Z951 Presence of aortocoronary bypass graft: Secondary | ICD-10-CM | POA: Diagnosis not present

## 2017-12-25 DIAGNOSIS — G8929 Other chronic pain: Secondary | ICD-10-CM

## 2017-12-25 DIAGNOSIS — M79671 Pain in right foot: Secondary | ICD-10-CM | POA: Insufficient documentation

## 2017-12-25 DIAGNOSIS — Z79899 Other long term (current) drug therapy: Secondary | ICD-10-CM | POA: Diagnosis not present

## 2017-12-25 DIAGNOSIS — Z7902 Long term (current) use of antithrombotics/antiplatelets: Secondary | ICD-10-CM | POA: Diagnosis not present

## 2017-12-25 DIAGNOSIS — Z888 Allergy status to other drugs, medicaments and biological substances status: Secondary | ICD-10-CM | POA: Insufficient documentation

## 2017-12-25 DIAGNOSIS — Z885 Allergy status to narcotic agent status: Secondary | ICD-10-CM | POA: Diagnosis not present

## 2017-12-25 DIAGNOSIS — M792 Neuralgia and neuritis, unspecified: Secondary | ICD-10-CM | POA: Diagnosis not present

## 2017-12-25 DIAGNOSIS — E1142 Type 2 diabetes mellitus with diabetic polyneuropathy: Secondary | ICD-10-CM

## 2017-12-25 DIAGNOSIS — Z794 Long term (current) use of insulin: Secondary | ICD-10-CM | POA: Diagnosis not present

## 2017-12-25 DIAGNOSIS — M79604 Pain in right leg: Secondary | ICD-10-CM | POA: Diagnosis not present

## 2017-12-25 DIAGNOSIS — M79605 Pain in left leg: Secondary | ICD-10-CM | POA: Insufficient documentation

## 2017-12-25 DIAGNOSIS — I739 Peripheral vascular disease, unspecified: Secondary | ICD-10-CM | POA: Diagnosis not present

## 2017-12-25 DIAGNOSIS — Z7982 Long term (current) use of aspirin: Secondary | ICD-10-CM | POA: Insufficient documentation

## 2017-12-25 MED ORDER — DEXAMETHASONE SODIUM PHOSPHATE 10 MG/ML IJ SOLN
10.0000 mg | Freq: Once | INTRAMUSCULAR | Status: AC
  Administered 2017-12-25: 10 mg
  Filled 2017-12-25: qty 1

## 2017-12-25 MED ORDER — LACTATED RINGERS IV SOLN
1000.0000 mL | Freq: Once | INTRAVENOUS | Status: AC
  Administered 2017-12-25: 1000 mL via INTRAVENOUS

## 2017-12-25 MED ORDER — LIDOCAINE HCL 2 % IJ SOLN
20.0000 mL | Freq: Once | INTRAMUSCULAR | Status: AC
  Administered 2017-12-25: 400 mg
  Filled 2017-12-25: qty 40

## 2017-12-25 MED ORDER — FENTANYL CITRATE (PF) 100 MCG/2ML IJ SOLN
25.0000 ug | INTRAMUSCULAR | Status: DC | PRN
  Administered 2017-12-25: 25 ug via INTRAVENOUS
  Filled 2017-12-25: qty 2

## 2017-12-25 MED ORDER — BUPIVACAINE-EPINEPHRINE (PF) 0.25% -1:200000 IJ SOLN
INTRAMUSCULAR | Status: AC
  Filled 2017-12-25: qty 30

## 2017-12-25 MED ORDER — MIDAZOLAM HCL 5 MG/5ML IJ SOLN
1.0000 mg | INTRAMUSCULAR | Status: DC | PRN
  Administered 2017-12-25: 1 mg via INTRAVENOUS
  Filled 2017-12-25: qty 5

## 2017-12-25 MED ORDER — BUPIVACAINE-EPINEPHRINE 0.25% -1:200000 IJ SOLN
9.0000 mL | Freq: Once | INTRAMUSCULAR | Status: DC
  Filled 2017-12-25: qty 9

## 2017-12-25 MED ORDER — IOPAMIDOL (ISOVUE-M 200) INJECTION 41%
10.0000 mL | Freq: Once | INTRAMUSCULAR | Status: AC
  Administered 2017-12-25: 10 mL via EPIDURAL
  Filled 2017-12-25: qty 10

## 2017-12-25 NOTE — Patient Instructions (Addendum)
____________________________________________________________________________________________  Post-Procedure Discharge Instructions  Instructions:  Apply ice: Fill a plastic sandwich bag with crushed ice. Cover it with a small towel and apply to injection site. Apply for 15 minutes then remove x 15 minutes. Repeat sequence on day of procedure, until you go to bed. The purpose is to minimize swelling and discomfort after procedure.  Apply heat: Apply heat to procedure site starting the day following the procedure. The purpose is to treat any soreness and discomfort from the procedure.  Food intake: Start with clear liquids (like water) and advance to regular food, as tolerated.   Physical activities: Keep activities to a minimum for the first 8 hours after the procedure.   Driving: If you have received any sedation, you are not allowed to drive for 24 hours after your procedure.  Blood thinner: Restart your blood thinner 6 hours after your procedure. (Only for those taking blood thinners)  Insulin: As soon as you can eat, you may resume your normal dosing schedule. (Only for those taking insulin)  Infection prevention: Keep procedure site clean and dry.  Post-procedure Pain Diary: Extremely important that this be done correctly and accurately. Recorded information will be used to determine the next step in treatment.  Pain evaluated is that of treated area only. Do not include pain from an untreated area.  Complete every hour, on the hour, for the initial 8 hours. Set an alarm to help you do this part accurately.  Do not go to sleep and have it completed later. It will not be accurate.  Follow-up appointment: Keep your follow-up appointment after the procedure. Usually 2 weeks for most procedures. (6 weeks in the case of radiofrequency.) Bring you pain diary.   Expect:  From numbing medicine (AKA: Local Anesthetics): Numbness or decrease in pain.  Onset: Full effect within 15  minutes of injected.  Duration: It will depend on the type of local anesthetic used. On the average, 1 to 8 hours.   From steroids: Decrease in swelling or inflammation. Once inflammation is improved, relief of the pain will follow.  Onset of benefits: Depends on the amount of swelling present. The more swelling, the longer it will take for the benefits to be seen. In some cases, up to 10 days.  Duration: Steroids will stay in the system x 2 weeks. Duration of benefits will depend on multiple posibilities including persistent irritating factors.  Occasional side-effects: Facial flushing (red, warm cheeks) , cramps (if present, drink Gatorade and take over-the-counter Magnesium 450-500 mg once to twice a day).  From procedure: Some discomfort is to be expected once the numbing medicine wears off. This should be minimal if ice and heat are applied as instructed.  Call if:  You experience numbness and weakness that gets worse with time, as opposed to wearing off.  New onset bowel or bladder incontinence. (This applies to Spinal procedures only)  Emergency Numbers:  [place] business hours (Monday - Thursday, 8:00 AM - 4:00 PM) (Friday, 9:00 AM - 12:00 Noon): [phone]  After hours: [phone] ____________________________________________________________________________________________   Patient to restart Plavix today at lunchtime.

## 2017-12-25 NOTE — Progress Notes (Signed)
Patient's Name: [name]  MRN: [id number]  Referring Provider: Milinda Pointer, MD  DOB: 07-Mar-1924  PCP: Crecencio Mc, MD  DOS: 12/25/2017  Note by: Gaspar Cola, MD  Service setting: Ambulatory outpatient  Specialty: Interventional Pain Management  Patient type: Established  Location: [facility] (AMB) Pain Management Facility  Visit type: Interventional Procedure   Primary Reason for Visit: Interventional Pain Management Treatment. CC: Foot Pain (bilateral)  Procedure:          Anesthesia, Analgesia, Anxiolysis:  Type: Diagnostic Lumbar Sympathetic Block #1  Region:Lumbosacral Level: L3 Laterality: Left-Sided Paravertebral  Type: Moderate (Conscious) Sedation combined with Local Anesthesia Indication(s): Analgesia and Anxiety Route: Intravenous (IV) IV Access: Secured Sedation: Meaningful verbal contact was maintained at all times during the procedure  Local Anesthetic: Lidocaine 1-2%  Position: Prone with head of the table was raised to facilitate breathing.   Indications: 1. Diabetic peripheral neuropathy ([place])   2. PVD (peripheral vascular disease) ([place])   3. Chronic lower extremity pain (Primary Area of Pain) (Bilateral) (L>R)   4. Neuropathic pain    Pain Score: Pre-procedure: 0-No pain/10 Post-procedure: 0-No pain/10  Pre-op Assessment:  Mr. Burkhead is a 84 y.o. (year old), male patient, seen today for interventional treatment. He  has a past surgical history that includes Spine surgery; Coronary artery bypass graft (2001); Pacemaker insertion; Femoral bypass (2011); Insert / replace / remove pacemaker; Lumbar laminectomy/decompression microdiscectomy (01/16/2011); Lower Extremity Angiography (Left, 08/18/2017); Lower Extremity Angiography (Left, 10/20/2017); Lower Extremity Angiography (Right, 10/27/2017); Amputation (Left, 10/29/2017); and Lower Extremity Angiography (Left, 11/10/2017). Mr. Mcguirt has a current medication list which includes the following  prescription(s): acetaminophen, amlodipine, atorvastatin, calcium carbonate-vitamin d, clopidogrel, collagenase, cyanocobalamin, fenofibrate, furosemide, glucose blood, hydrocortisone, insulin aspart protamine - aspart, insulin pen needle, insulin syringe-needle u-100, latanoprost, losartan, metoprolol tartrate, multivitamin, polyethylene glycol, protein supplement shake, silver sulfadiazine, syringe (disposable), syringe-needle (disp) 3 ml, amitriptyline, and aspirin, and the following Facility-Administered Medications: bupivacaine-epinephrine, fentanyl, and midazolam. His primarily concern today is the Foot Pain (bilateral)  Initial Vital Signs:  Pulse/HCG Rate: 76ECG Heart Rate: 65 Temp: 97.8 F (36.6 C) Resp: 16 BP: (!) 138/52 SpO2: 100 %  BMI: Estimated body mass index is 25.24 kg/m as calculated from the following:   Height as of this encounter: 5\' 8"  (1.727 m).   Weight as of this encounter: 166 lb (75.3 kg).  Risk Assessment: Allergies: Reviewed. He is allergic to hydrocodone-acetaminophen; lyrica [pregabalin]; tramadol; and trazodone and nefazodone.  Allergy Precautions: None required Coagulopathies: Reviewed. None identified.  Blood-thinner therapy: None at this time Active Infection(s): Reviewed. None identified. Mr. Kubisiak is afebrile  Site Confirmation: Mr. Priola was asked to confirm the procedure and laterality before marking the site Procedure checklist: Completed Consent: Before the procedure and under the influence of no sedative(s), amnesic(s), or anxiolytics, the patient was informed of the treatment options, risks and possible complications. To fulfill our ethical and legal obligations, as recommended by the American Medical Association's Code of Ethics, I have informed the patient of my clinical impression; the nature and purpose of the treatment or procedure; the risks, benefits, and possible complications of the intervention; the alternatives, including doing  nothing; the risk(s) and benefit(s) of the alternative treatment(s) or procedure(s); and the risk(s) and benefit(s) of doing nothing. The patient was provided information about the general risks and possible complications associated with the procedure. These may include, but are not limited to: failure to achieve desired goals, infection, bleeding, organ or nerve damage, allergic reactions, paralysis,  and death. In addition, the patient was informed of those risks and complications associated to Spine-related procedures, such as failure to decrease pain; infection (i.e.: Meningitis, epidural or intraspinal abscess); bleeding (i.e.: epidural hematoma, subarachnoid hemorrhage, or any other type of intraspinal or peri-dural bleeding); organ or nerve damage (i.e.: Any type of peripheral nerve, nerve root, or spinal cord injury) with subsequent damage to sensory, motor, and/or autonomic systems, resulting in permanent pain, numbness, and/or weakness of one or several areas of the body; allergic reactions; (i.e.: anaphylactic reaction); and/or death. Furthermore, the patient was informed of those risks and complications associated with the medications. These include, but are not limited to: allergic reactions (i.e.: anaphylactic or anaphylactoid reaction(s)); adrenal axis suppression; blood sugar elevation that in diabetics may result in ketoacidosis or comma; water retention that in patients with history of congestive heart failure may result in shortness of breath, pulmonary edema, and decompensation with resultant heart failure; weight gain; swelling or edema; medication-induced neural toxicity; particulate matter embolism and blood vessel occlusion with resultant organ, and/or nervous system infarction; and/or aseptic necrosis of one or more joints. Finally, the patient was informed that Medicine is not an exact science; therefore, there is also the possibility of unforeseen or unpredictable risks and/or possible  complications that may result in a catastrophic outcome. The patient indicated having understood very clearly. We have given the patient no guarantees and we have made no promises. Enough time was given to the patient to ask questions, all of which were answered to the patient's satisfaction. Mr. Zellars has indicated that he wanted to continue with the procedure. Attestation: I, the ordering provider, attest that I have discussed with the patient the benefits, risks, side-effects, alternatives, likelihood of achieving goals, and potential problems during recovery for the procedure that I have provided informed consent. Date  Time: 12/25/2017  8:11 AM  Pre-Procedure Preparation:  Monitoring: As per clinic protocol. Respiration, ETCO2, SpO2, BP, heart rate and rhythm monitor placed and checked for adequate function Safety Precautions: Patient was assessed for positional comfort and pressure points before starting the procedure. Time-out: I initiated and conducted the "Time-out" before starting the procedure, as per protocol. The patient was asked to participate by confirming the accuracy of the "Time Out" information. Verification of the correct person, site, and procedure were performed and confirmed by me, the nursing staff, and the patient. "Time-out" conducted as per Joint Commission's Universal Protocol (UP.01.01.01). Time: 6384  Description of Procedure:          Target Area: For Lumbar Sympathetic Block(s), the target is the anterolateral aspect of the L3 & L4 vertebral bodies, where the lumbar sympathetic chain resides. Approach: Paravertebral, ipsilateral approach. Area Prepped: Entire Posterior Thoracolumbar Region Prepping solution:  ChloraPrep (2% chlorhexidine gluconate and 70% isopropyl alcohol) Safety Precautions: Aspiration looking for blood return was conducted prior to all injections. At no point did we inject any substances, as a needle was being advanced. No attempts were made at  seeking any paresthesias. Safe injection practices and needle disposal techniques used. Medications properly checked for expiration dates. SDV (single dose vial) medications used. Description of the Procedure: Protocol guidelines were followed. The patient was placed in position over the procedure table. The target area was identified and the area prepped in the usual manner. Skin & deeper tissues infiltrated with local anesthetic. Appropriate amount of time allowed to pass for local anesthetics to take effect. The procedure needles were then advanced to the target area, the superior anterolateral border of the  L3 vertebral body, under pulsed fluoroscopic guidance. Care was taken not to advance the tip of the needle past the anterior border of the vertebral body, on the lateral fluoroscopic view. Proper needle placement secured. Negative aspiration confirmed. Solution injected in intermittent fashion, asking for systemic symptoms every 0.5cc of injectate. The needles were then removed and the area cleansed, making sure to leave some of the prepping solution back to take advantage of its long term bactericidal properties. Vitals:   12/25/17 0909 12/25/17 0919 12/25/17 0929 12/25/17 0939  BP: (!) 124/43 138/66 130/76 (!) 148/54  Pulse:      Resp: 12 13 15 13   Temp:  98.3 F (36.8 C)  98.3 F (36.8 C)  TempSrc: Skin Temporal  Temporal  SpO2: 100% 100% 100% 100%  Weight:      Height:        Start Time: 0852 hrs. End Time: 0909 hrs. Materials:  Needle(s) Type: Spinal Needle Gauge: 22G Length: 5-in Medication(s): Please see orders for medications and dosing details.  Imaging Guidance (Spinal):          Type of Imaging Technique: Fluoroscopy Guidance (Spinal) Indication(s): Assistance in needle guidance and placement for procedures requiring needle placement in or near specific anatomical locations not easily accessible without such assistance. Exposure Time: Please see nurses notes. Contrast:  Before injecting any contrast, we confirmed that the patient did not have an allergy to iodine, shellfish, or radiological contrast. Once satisfactory needle placement was completed at the desired level, radiological contrast was injected. Contrast injected under live fluoroscopy. No contrast complications. See chart for type and volume of contrast used. Fluoroscopic Guidance: I was personally present during the use of fluoroscopy. "Tunnel Vision Technique" used to obtain the best possible view of the target area. Parallax error corrected before commencing the procedure. "Direction-depth-direction" technique used to introduce the needle under continuous pulsed fluoroscopy. Once target was reached, antero-posterior, oblique, and lateral fluoroscopic projection used confirm needle placement in all planes. Images permanently stored in EMR.      Interpretation: I personally interpreted the imaging intraoperatively. Adequate needle placement confirmed in multiple planes. Appropriate spread of contrast into desired area was observed. No evidence of afferent or efferent intravascular uptake. No intrathecal or subarachnoid spread observed. Permanent images saved into the patient's record.  Antibiotic Prophylaxis:   Anti-infectives (From admission, onward)   None     Indication(s): None identified  Post-operative Assessment:  Post-procedure Vital Signs:  Pulse/HCG Rate: 7661 Temp: 98.3 F (36.8 C)(93.0 R 92.6 L feet ) Resp: 13 BP: (!) 148/54 SpO2: 100 %  EBL: None  Complications: No immediate post-treatment complications observed by team, or reported by patient.  Note: The patient tolerated the entire procedure well. A repeat set of vitals were taken after the procedure and the patient was kept under observation following institutional policy, for this type of procedure. Post-procedural neurological assessment was performed, showing return to baseline, prior to discharge. The patient was provided  with post-procedure discharge instructions, including a section on how to identify potential problems. Should any problems arise concerning this procedure, the patient was given instructions to immediately contact us, at any time, without hesitation. In any case, we plan to contact the patient by telephone for a follow-up status report regarding this interventional procedure.  Comments:  No additional relevant information.  Plan of Care    Imaging Orders     DG C-Arm 1-60 Min-No Report  Procedure Orders     LUMBAR SYMPATHETIC BLOCK  Medications ordered  for procedure: Meds ordered this encounter  Medications  . iopamidol (ISOVUE-M) 41 % intrathecal injection 10 mL    Must be Myelogram-compatible. If not available, you may substitute with a water-soluble, non-ionic, hypoallergenic, myelogram-compatible radiological contrast medium.  Marland Kitchen lidocaine (XYLOCAINE) 2 % (with pres) injection 400 mg  . midazolam (VERSED) 5 MG/5ML injection 1-2 mg    Make sure Flumazenil is available in the pyxis when using this medication. If oversedation occurs, administer 0.2 mg IV over 15 sec. If after 45 sec no response, administer 0.2 mg again over 1 min; may repeat at 1 min intervals; not to exceed 4 doses (1 mg)  . fentaNYL (SUBLIMAZE) injection 25-50 mcg    Make sure Narcan is available in the pyxis when using this medication. In the event of respiratory depression (RR< 8/min): Titrate NARCAN (naloxone) in increments of 0.1 to 0.2 mg IV at 2-3 minute intervals, until desired degree of reversal.  . lactated ringers infusion 1,000 mL  . bupivacaine-EPINEPHrine (MARCAINE W/ EPI) 0.25% -1:200000 (with pres) injection 9 mL  . dexamethasone (DECADRON) injection 10 mg   Medications administered: We administered iopamidol, lidocaine, midazolam, fentaNYL, lactated ringers, and dexamethasone.  See the medical record for exact dosing, route, and time of administration.  Disposition: Discharge home  Discharge Date &  Time: 12/25/2017; 0949 hrs.   Physician-requested Follow-up: Return for post-procedure eval (2 wks), w/ Dr. Dossie Arbour.  Future Appointments  Date Time Provider [place]  12/26/2017 11:15 AM Jeri Cos Eday III, PA-C [facility] None  01/08/2018  9:15 AM Jeri Cos Eday III, PA-C [facility] None  01/12/2018  1:00 PM Milinda Pointer, MD [facility] None  01/22/2018 10:15 AM Woodroe Chen III, PA-C [facility] None  02/03/2018 10:30 AM [facility] None  02/03/2018 11:00 AM [facility] None  02/03/2018 11:45 AM Dew, Erskine Squibb, MD [facility] None  03/19/2018  2:00 PM O'Brien-Blaney, Bryson Corona, LPN [facility]   Primary Care Physician: Crecencio Mc, MD Location: [facility] Note by: Gaspar Cola, MD Date: 12/25/2017; Time: 9:52 AM  Disclaimer:  Medicine is not an exact science. The only guarantee in medicine is that nothing is guaranteed. It is important to note that the decision to proceed with this intervention was based on the information collected from the patient. The Data and conclusions were drawn from the patient's questionnaire, the interview, and the physical examination. Because the information was provided in large part by the patient, it cannot be guaranteed that it has not been purposely or unconsciously manipulated. Every effort has been made to obtain as much relevant data as possible for this evaluation. It is important to note that the conclusions that lead to this procedure are derived in large part from the available data. Always take into account that the treatment will also be dependent on availability of resources and existing treatment guidelines, considered by other Pain Management Practitioners as being common knowledge and practice, at the time of the intervention. For Medico-Legal purposes, it is also important to point out that variation in procedural techniques and pharmacological choices are the acceptable norm.  The indications, contraindications, technique, and results of the above procedure should only be interpreted and judged by a Board-Certified Interventional Pain Specialist with extensive familiarity and expertise in the same exact procedure and technique.

## 2017-12-25 NOTE — Progress Notes (Signed)
Safety precautions to be maintained throughout the outpatient stay will include: orient to surroundings, keep bed in low position, maintain call bell within reach at all times, provide assistance with transfer out of bed and ambulation.  

## 2017-12-26 ENCOUNTER — Encounter: Payer: Medicare Other | Admitting: Physician Assistant

## 2017-12-26 ENCOUNTER — Telehealth: Payer: Self-pay

## 2017-12-26 DIAGNOSIS — L97312 Non-pressure chronic ulcer of right ankle with fat layer exposed: Secondary | ICD-10-CM | POA: Diagnosis not present

## 2017-12-26 DIAGNOSIS — E11621 Type 2 diabetes mellitus with foot ulcer: Secondary | ICD-10-CM | POA: Diagnosis not present

## 2017-12-26 DIAGNOSIS — I70245 Atherosclerosis of native arteries of left leg with ulceration of other part of foot: Secondary | ICD-10-CM | POA: Diagnosis not present

## 2017-12-26 DIAGNOSIS — I132 Hypertensive heart and chronic kidney disease with heart failure and with stage 5 chronic kidney disease, or end stage renal disease: Secondary | ICD-10-CM | POA: Diagnosis not present

## 2017-12-26 DIAGNOSIS — L97522 Non-pressure chronic ulcer of other part of left foot with fat layer exposed: Secondary | ICD-10-CM | POA: Diagnosis not present

## 2017-12-26 DIAGNOSIS — E1122 Type 2 diabetes mellitus with diabetic chronic kidney disease: Secondary | ICD-10-CM | POA: Diagnosis not present

## 2017-12-26 DIAGNOSIS — T8189XA Other complications of procedures, not elsewhere classified, initial encounter: Secondary | ICD-10-CM | POA: Diagnosis not present

## 2017-12-26 DIAGNOSIS — L97512 Non-pressure chronic ulcer of other part of right foot with fat layer exposed: Secondary | ICD-10-CM | POA: Diagnosis not present

## 2017-12-26 DIAGNOSIS — L97526 Non-pressure chronic ulcer of other part of left foot with bone involvement without evidence of necrosis: Secondary | ICD-10-CM | POA: Diagnosis not present

## 2017-12-26 DIAGNOSIS — I5022 Chronic systolic (congestive) heart failure: Secondary | ICD-10-CM | POA: Diagnosis not present

## 2017-12-26 NOTE — Telephone Encounter (Signed)
Post procedure phone call.  Patient states he id doing very good and has no pain.

## 2017-12-28 NOTE — Progress Notes (Signed)
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 appears to be doing about the same in regard to his right second toe ulcer although there's a little bit more in the way of slough noted over the surface of the wound. Bone is still exposed. In regard to left foot he has a lot of maceration around the surgical site even affecting the second and fourth toes surrounding due to all the drainage. He has been putting Santyl on this area although he tells me that no dressing has been applied to the site. The dorsal surface of the foot does seem to be doing in better unfortunately the fifth toe with the to now independently was removed does not appear to be healing to well in my pinion. Overall I'm very unsure of how things are really doing as far as the overall appearance of everything is concerned. I'm beginning to question whether or not these wounds really gonna be able to be healed through traditional wound care measures. 12/11/17 on evaluation today patient actually appears to be doing about the same in regard to his foot ulcers. Overall I really do not see a significant amount of improvement. He still has sinuses to some degree in the left foot in particular the right is a little better. I had last week spoken with [facility] in the care of his surgical site they do want to transfer to [place] as well this is the left third toe amputation. Nonetheless I did have a conversation with the patient today discussing options as far as treatment is concerned. He does not really want to consider any further amputation as he wants to try to maintain mobility as much as possible. For that reason we're gonna maintain more palliative wound care at this point trying to do the best we can as far as treating the ulcers with the knowledge that due to his blood flow that may be difficult to get any of  these areas to heal. 12/26/17 on evaluation today patient actually presents for follow-up concerning his bilateral lower extremity ulcers. At this time it does not appear that the patient has any additional overall worsening at the surgical site of the left third toe. With that being said he does have a lot of necrotic tissue overlying the surface of this region that I think a careful selective debridement would benefit. Obviously I'm not gonna recommend anything too aggressive at the site just due to his poor microvascular blood flow. Nonetheless if we have any chance of getting this to heal I think some careful debridement is going to be necessary. He has several blister areas unfortunately due to his swelling that is occurring. We are avoiding compression stockings at this time being that with his poor microvascular blood flow the last time he had these on everything turned a very poor blue color. The area that I marked to watch out for the red spreading of his foot does  not appear to have caused any issues nor spread any further than what I saw during his office visit. Overall I'm extremely pleased with how things have progressed on not concerned as much today as I have been in the past with my visits. His daughter Kathlee Nations is present during the evaluation today. Patient History Information obtained from Patient. Social History Former smoker, Marital Status - Widowed, Alcohol Use - Rarely, Drug Use - No History, Caffeine Use - Never. 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 ([id number]) Review of Systems (ROS) Constitutional Symptoms (General Health) Denies complaints or symptoms of Fever, Chills. Respiratory The patient has no complaints or symptoms. Cardiovascular Complains or has symptoms of LE edema. Psychiatric The patient has no complaints or symptoms. Objective Constitutional Well-nourished and  well-hydrated in no acute distress. Vitals Time Taken: 11:30 AM, Height: 68 in, Weight: 180 lbs, BMI: 27.4, Temperature: 97.8 F, Pulse: 70 bpm, Respiratory Rate: 18 breaths/min, Blood Pressure: 139/45 mmHg. Respiratory normal breathing without difficulty. clear to auscultation bilaterally. Cardiovascular regular rate and rhythm with normal S1, S2. Psychiatric this patient is able to make decisions and demonstrates good insight into disease process. Alert and Oriented x 3. pleasant and cooperative. General Notes: Upon inspection patient does have several areas of blistering at least one or two which are new as of this week. Nonetheless none of these areas seem to be worsening as far as the overall size or appearance is concerned unfortunately he's also not having as much improvement and I believe this is mainly due to the fact that he's not able to wear his compression stockings due to the poor microvascular blood flow which was previously affecting the blood flow to his distal extremities of the bilateral lower extremities. Nonetheless the left fifth toe does appear to be doing better to me and in regard to the amputation site at the base of the third toe where it was removed there is necrotic tissue which also shows a suture that is not of the observable type noted in the necrotic tissue. I discussed with the patient that a careful selective debridement at the site would likely benefit him especially with the continuation of the Iodoflex. He was definitely in agreement with this. Therefore after discussing this further at this point today I did opt to perform again a careful selected debridement where removed only nonviable tissue no bleeding occurred. Patient had only minimal discomfort mainly from the lifting of necrotic tissue to trim this way with scissors and forceps. Post debridement the wound did appear to be better from the standpoint of not having such a significant burden of  nonviable tissue on the surface of this wound. Nonetheless there were two absorbable sutures which I visualized within the necrotic tissue remaining they however were too painful for me to remove I'm just gonna leave this in place they will absorb eventually or else come out of their own accord. At this time we will continue to use dressing means by which to continue debridement. Specifically Iodoflex. Integumentary (Hair, Skin) Wound #1 status is Open. Original cause of wound was Gradually Appeared. The wound is located on the Right Toe Second. The wound measures 0.8cm length x 0.9cm width x 0.3cm depth; 0.565cm^2 area and 0.17cm^3 volume. There is Fat Layer (Subcutaneous Tissue) Exposed exposed. There is no tunneling noted, however, there is undermining starting at 4:00 and ending at 9:00 with a maximum distance of 0.3cm. There is a  small amount of serous drainage noted. There is no granulation Fettes, Flor S. ([id number]) within the wound bed. There is a medium (34-66%) amount of necrotic tissue within the wound bed including Adherent Slough. The periwound skin appearance exhibited: Callus. The periwound skin appearance did not exhibit: Crepitus, Excoriation, Induration, Rash, Scarring, Dry/Scaly, Maceration, Atrophie Blanche, Cyanosis, Ecchymosis, Hemosiderin Staining, Mottled, Pallor, Rubor, Erythema. Wound #3 status is Open. Original cause of wound was Not Known. The wound is located on the Left,Lateral Toe Fifth. The wound measures 0.6cm length x 0.3cm width x 0.1cm depth; 0.141cm^2 area and 0.014cm^3 volume. There is Fat Layer (Subcutaneous Tissue) Exposed exposed. There is no tunneling or undermining noted. There is a medium amount of serous drainage noted. The wound margin is flat and intact. There is no granulation within the wound bed. There is no necrotic tissue within the wound bed. The periwound skin appearance had no abnormalities noted for texture. The periwound skin appearance  had no abnormalities noted for color. The periwound skin appearance exhibited: Maceration. The periwound skin appearance did not exhibit: Dry/Scaly. Periwound temperature was noted as No Abnormality. The periwound has tenderness on palpation. Wound #4 status is Open. Original cause of wound was Trauma. The wound is located on the Left,Medial,Dorsal Foot. The wound measures 1cm length x 0.6cm width x 0.2cm depth; 0.471cm^2 area and 0.094cm^3 volume. There is Fat Layer (Subcutaneous Tissue) Exposed exposed. There is no tunneling or undermining noted. There is a small amount of serous drainage noted. The wound margin is flat and intact. There is no granulation within the wound bed. There is a large (67-100%) amount of necrotic tissue within the wound bed including Eschar and Adherent Slough. The periwound skin appearance did not exhibit: Callus, Crepitus, Excoriation, Induration, Rash, Scarring, Dry/Scaly, Maceration, Atrophie Blanche, Cyanosis, Ecchymosis, Hemosiderin Staining, Mottled, Pallor, Rubor, Erythema. Periwound temperature was noted as No Abnormality. Wound #5 status is Open. Original cause of wound was Surgical Injury. The wound is located on the Left Toe Third. The wound measures 2.3cm length x 0.8cm width x 0.1cm depth; 1.445cm^2 area and 0.145cm^3 volume. There is no tunneling or undermining noted. There is a small amount of serous drainage noted. The wound margin is indistinct and nonvisible. There is no granulation within the wound bed. There is a large (67-100%) amount of necrotic tissue within the wound bed including Adherent Slough. The periwound skin appearance did not exhibit: Callus, Crepitus, Excoriation, Induration, Rash, Scarring, Dry/Scaly, Maceration, Atrophie Blanche, Cyanosis, Ecchymosis, Hemosiderin Staining, Mottled, Pallor, Rubor, Erythema. Wound #6 status is Open. Original cause of wound was Not Known. The wound is located on the Right,Dorsal Foot. The wound measures  4.3cm length x 5.8cm width x 0.1cm depth; 19.588cm^2 area and 1.959cm^3 volume. There is Fat Layer (Subcutaneous Tissue) Exposed exposed. There is no tunneling or undermining noted. There is a medium amount of serous drainage noted. There is small (1-33%) granulation within the wound bed. There is a large (67-100%) amount of necrotic tissue within the wound bed including Adherent Slough. The periwound skin appearance did not exhibit: Callus, Crepitus, Excoriation, Induration, Rash, Scarring, Dry/Scaly, Maceration, Atrophie Blanche, Cyanosis, Ecchymosis, Hemosiderin Staining, Mottled, Pallor, Rubor, Erythema. Wound #7 status is Open. Original cause of wound was Not Known. The wound is located on the Right,Posterior Ankle. The wound measures 0.5cm length x 0.8cm width x 0.1cm depth; 0.314cm^2 area and 0.031cm^3 volume. There is Fat Layer (Subcutaneous Tissue) Exposed exposed. There is no tunneling or undermining noted. There is a small amount of serous drainage  noted. There is no granulation within the wound bed. There is no necrotic tissue within the wound bed. The periwound skin appearance did not exhibit: Callus, Crepitus, Excoriation, Induration, Rash, Scarring, Dry/Scaly, Maceration, Atrophie Blanche, Cyanosis, Ecchymosis, Hemosiderin Staining, Mottled, Pallor, Rubor, Erythema. 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 There was a Selective/Open Wound Skin/Epidermis Debridement with a total area of 1.84 sq cm performed by STONE III, HOYT E., PA-C. With the following instrument(s): Forceps, and Scissors to remove Non- Viable tissue/material. Material removed includes Eschar, Skin: Dermis, and Skin: Epidermis after achieving pain control using Lidocaine 4% Topical Solution. No specimens were taken. A time out was conducted at 12:13, prior to the start of the procedure. There was no bleeding. The procedure was tolerated well with a pain level of 0 throughout and a pain level of 0 following the procedure. Post Debridement Measurements: 2.3cm length x 0.8cm width x 0.2cm depth; 0.289cm^3 volume. Character of Wound/Ulcer Post Debridement is improved. Severity of Tissue Post Debridement is: Fat layer exposed. Post procedure Diagnosis Wound #5: Same as Pre-Procedure Plan Wound Cleansing: Wound #1 Right Toe Second: Clean wound with Normal Saline. May Shower, gently pat wound dry prior to applying new dressing. Wound #6 Right,Dorsal Foot: Clean wound with Normal Saline. May Shower, gently pat wound dry prior to applying new dressing. Wound #7 Right,Posterior Ankle: Clean wound with Normal Saline. May Shower, gently pat wound dry prior to applying new dressing. Wound #3 Left,Lateral Toe Fifth: Clean wound with Normal Saline. May Shower, gently pat wound dry prior to applying new dressing. Wound #4 Left,Medial,Dorsal Foot: Clean wound with Normal Saline. May Shower, gently pat wound dry prior to applying new dressing. Primary Wound Dressing: Wound #1 Right Toe Second: Silver Alginate Wound #3 Left,Lateral Toe Fifth: Silver Alginate - silver alginate between toes also Wound #4 Left,Medial,Dorsal Foot: Iodoflex Wound #5 Left Toe Third: Iodoflex Wound #6 Right,Dorsal Foot: Silver Alginate Wound #7 Right,Posterior Ankle: Silver  Alginate Secondary Dressing: Melin, Sebastyan S. ([id number]) Wound #1 Right Toe Second: Other - Coverlet Wound #3 Left,Lateral Toe Fifth: Gauze and Kerlix/Conform Wound #6 Right,Dorsal Foot: Gauze and Kerlix/Conform Wound #7 Right,Posterior Ankle: Gauze and Kerlix/Conform Wound #4 Left,Medial,Dorsal Foot: Gauze and Kerlix/Conform Dressing Change Frequency: Wound #1 Right Toe Second: Change Dressing Monday, Wednesday, Friday - HOMEHEALTH-Monday, Wednesday, Friday Wound #3 Left,Lateral Toe Fifth: Change Dressing Monday, Wednesday, Friday - HOMEHEALTH-Monday, Wednesday, Friday Wound #6 Right,Dorsal Foot: Change Dressing Monday, Wednesday, Friday - HOMEHEALTH-Monday, Wednesday, Friday Wound #7 Right,Posterior Ankle: Change Dressing Monday, Wednesday, Friday - HOMEHEALTH-Monday, Wednesday, Friday Wound #4 Left,Medial,Dorsal Foot: Change dressing every day. - HHRN to change dressing three times weekly and [facility] will change dressing once and family will attempt to change dressing the other days Follow-up Appointments: Wound #1 Right Toe Second: Return Appointment in 2 weeks. Wound #6 Right,Dorsal Foot: Return Appointment in 2 weeks. Wound #7 Right,Posterior Ankle: Return Appointment in 2 weeks. Wound #3 Left,Lateral Toe Fifth: Return Appointment in 2 weeks. Wound #  4 Left,Medial,Dorsal Foot: Return Appointment in 2 weeks. Wound #5 Left Toe Third: Return Appointment in 2 weeks. Home Health: Wound #1 Right Toe Second: Continue Home Health Visits - [place]: Monday, Wednesday and Friday Home Health Nurse may visit PRN to address patient s wound care needs. FACE TO FACE ENCOUNTER: MEDICARE and MEDICAID PATIENTS: I certify that this patient is under my care and that I had a face-to-face encounter that meets the physician face-to-face encounter requirements with this patient on this date. The encounter with the patient was in whole or in part for the following  MEDICAL CONDITION: (primary reason for [place]) MEDICAL NECESSITY: I certify, that based on my findings, NURSING services are a medically necessary home health service. HOME BOUND STATUS: I certify that my clinical findings support that this patient is homebound (i.e., Due to illness or injury, pt requires aid of supportive devices such as crutches, cane, wheelchairs, walkers, the use of special transportation or the assistance of another person to leave their place of residence. There is a normal inability to leave the home and doing so requires considerable and taxing effort. Other absences are for medical reasons / religious services and are infrequent or of short duration when for other reasons). If current dressing causes regression in wound condition, may D/C ordered dressing product/s and apply Normal Saline Moist Dressing daily until next [place] / Other MD appointment. [place] of regression in wound condition at [phone]. Please direct any NON-WOUND related issues/requests for orders to patient's Primary Care Physician Wound #6 Right,Dorsal Foot: [place] Visits - [facility]: Monday, Wednesday and Friday Home Health Nurse may visit PRN to address patient s wound care needs. FACE TO FACE ENCOUNTER: MEDICARE and MEDICAID PATIENTS: I certify that this patient is under my care and that I had a face-to-face encounter that meets the physician face-to-face encounter requirements with this patient on this date. The encounter with the patient was in whole or in part for the following MEDICAL CONDITION: (primary reason for [place]) MEDICAL NECESSITY: I certify, that based on my findings, NURSING services are a medically necessary home health service. HOME BOUND STATUS: I certify that my clinical findings support that this patient is homebound (i.e., Due to [facility], YVON MCCORD. ([id number]) illness or injury, pt requires aid of supportive  devices such as crutches, cane, wheelchairs, walkers, the use of special transportation or the assistance of another person to leave their place of residence. There is a normal inability to leave the home and doing so requires considerable and taxing effort. Other absences are for medical reasons / religious services and are infrequent or of short duration when for other reasons). If current dressing causes regression in wound condition, may D/C ordered dressing product/s and apply Normal Saline Moist Dressing daily until next [place] / Other MD appointment. [place] of regression in wound condition at [phone]. Please direct any NON-WOUND related issues/requests for orders to patient's Primary Care Physician Wound #7 Right,Posterior Ankle: [place] Visits - [facility]: Monday, Wednesday and Friday Home Health Nurse may visit PRN to address patient s wound care needs. FACE TO FACE ENCOUNTER: MEDICARE and MEDICAID PATIENTS: I certify that this patient is under my care and that I had a face-to-face encounter that meets the physician face-to-face encounter requirements with this patient on this date. The encounter with the patient was in whole or in part for the following MEDICAL CONDITION: (primary reason for [place]) MEDICAL NECESSITY:  I certify, that based on my findings, NURSING services are a medically necessary home health service. HOME BOUND STATUS: I certify that my clinical findings support that this patient is homebound (i.e., Due to illness or injury, pt requires aid of supportive devices such as crutches, cane, wheelchairs, walkers, the use of special transportation or the assistance of another person to leave their place of residence. There is a normal inability to leave the home and doing so requires considerable and taxing effort. Other absences are for medical reasons / religious services and are infrequent or of short duration  when for other reasons). If current dressing causes regression in wound condition, may D/C ordered dressing product/s and apply Normal Saline Moist Dressing daily until next [place] / Other MD appointment. [place] of regression in wound condition at [phone]. Please direct any NON-WOUND related issues/requests for orders to patient's Primary Care Physician Wound #3 Left,Lateral Toe Fifth: [place] Visits - [facility]: Monday, Wednesday and Friday Home Health Nurse may visit PRN to address patient s wound care needs. FACE TO FACE ENCOUNTER: MEDICARE and MEDICAID PATIENTS: I certify that this patient is under my care and that I had a face-to-face encounter that meets the physician face-to-face encounter requirements with this patient on this date. The encounter with the patient was in whole or in part for the following MEDICAL CONDITION: (primary reason for [place]) MEDICAL NECESSITY: I certify, that based on my findings, NURSING services are a medically necessary home health service. HOME BOUND STATUS: I certify that my clinical findings support that this patient is homebound (i.e., Due to illness or injury, pt requires aid of supportive devices such as crutches, cane, wheelchairs, walkers, the use of special transportation or the assistance of another person to leave their place of residence. There is a normal inability to leave the home and doing so requires considerable and taxing effort. Other absences are for medical reasons / religious services and are infrequent or of short duration when for other reasons). If current dressing causes regression in wound condition, may D/C ordered dressing product/s and apply Normal Saline Moist Dressing daily until next [place] / Other MD appointment. [place] of regression in wound condition at [phone]. Please direct any NON-WOUND related issues/requests for orders  to patient's Primary Care Physician Wound #4 Left,Medial,Dorsal Foot: [place] Visits - [facility]: Monday, Wednesday and Friday Home Health Nurse may visit PRN to address patient s wound care needs. FACE TO FACE ENCOUNTER: MEDICARE and MEDICAID PATIENTS: I certify that this patient is under my care and that I had a face-to-face encounter that meets the physician face-to-face encounter requirements with this patient on this date. The encounter with the patient was in whole or in part for the following MEDICAL CONDITION: (primary reason for [place]) MEDICAL NECESSITY: I certify, that based on my findings, NURSING services are a medically necessary home health service. HOME BOUND STATUS: I certify that my clinical findings support that this patient is homebound (i.e., Due to illness or injury, pt requires aid of supportive devices such as crutches, cane, wheelchairs, walkers, the use of special transportation or the assistance of another person to leave their place of residence. There is a normal inability to leave the home and doing so requires considerable and taxing effort. Other absences are for medical reasons / religious services and are infrequent or of short duration when for other reasons). If current dressing causes regression in wound condition,  may D/C ordered dressing product/s and apply Normal Saline Moist Dressing daily until next [place] / Other MD appointment. [place] of regression in wound condition at [phone]. Please direct any NON-WOUND related issues/requests for orders to patient's Primary Care Physician [place], SARA SELVIDGE ([id number]) At this point my suggestion is going to be that we continue with the above wound care measures. I am at least a little bit encouraged by the overall appearance of his lower extremities today compared to what I've seen in the past. The patient and his daughter were both happy to hear this.  Nonetheless we are going to attempt Tubigrip to help slightly with some of the fluid control if it hopefully will. My hope is that this will help to prevent further blister buildup as again the last thing he needs our continual new wound openings. He is in agreement with giving this a try. We will subsequently see him back for reevaluation in two weeks time to see were things stand. If anything worsens or changes the meantime the patient, his home health nurse, or his daughter will get in touch with [place] to let us know. Otherwise it was a pleasure seeing Mr. McNeil today. Electronic Signature(s) Signed: 12/26/2017 5:33:52 PM By: Worthy Keeler PA-C Entered By: Worthy Keeler on 12/26/2017 14:35:21 Police, Rose [name] ([id number]) -------------------------------------------------------------------------------- ROS/PFSH Details Patient Name: Sherbert, Illya S. Date of Service: 12/26/2017 11:15 AM Medical Record Number: [id number] Patient Account Number: [id number] Date of Birth/Sex: 03-18-1924 (84 y.o. M) Treating RN: Montey Hora Primary Care Provider: Deborra Medina Other Clinician: Referring Provider: Deborra Medina Treating Provider/Extender: Melburn Hake, HOYT Weeks in Treatment: 11 Information Obtained From Patient Wound History Constitutional Symptoms (General Health) Complaints and Symptoms: Negative for: Fever; Chills Cardiovascular Complaints and Symptoms: Positive for: LE edema Medical History: Positive for: Coronary Artery Disease; Hypertension; Peripheral Venous Disease Negative for: Angina; Arrhythmia; Congestive Heart Failure; Deep Vein Thrombosis; Hypotension; Myocardial Infarction; Peripheral Arterial Disease; Phlebitis; Vasculitis Past Medical History Notes: pacemaker, 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 leg, stent left leg Eyes Medical History: Positive for: Cataracts Negative for: Glaucoma; Optic Neuritis Ear/Nose/Mouth/Throat Medical History: Negative for: Chronic sinus  problems/congestion; Middle ear problems Hematologic/Lymphatic Medical History: Positive for: Lymphedema Negative for: Anemia; Hemophilia; Human Immunodeficiency Virus; Sickle Cell Disease Respiratory Complaints and Symptoms: No Complaints or Symptoms Medical History: Negative for: Aspiration; Asthma; Chronic Obstructive Pulmonary Disease (COPD); Pneumothorax; Sleep Apnea; Tuberculosis Gastrointestinal [place], [place] ([id number]) Medical History: Negative for: Cirrhosis ; Colitis; Crohnos; Hepatitis A; Hepatitis B; Hepatitis C Endocrine Medical History: Positive for: Type II Diabetes - since 1988 Negative for: Type I Diabetes Treated with: Insulin Blood sugar tested every day: Yes Tested : 3 times daily Blood sugar testing results: Breakfast: 114 Genitourinary Medical History: Positive for: End Stage Renal Disease Immunological Medical History: Negative for: Lupus Erythematosus; Raynaudos; Scleroderma Integumentary (Skin) Medical History: Negative for: History of Burn; History of pressure wounds Musculoskeletal Medical History: Positive for: Rheumatoid Arthritis Negative for: Gout; Osteoarthritis; Osteomyelitis Neurologic Medical History: Positive for: Neuropathy - feet and legs Negative for: Dementia; Quadriplegia; Paraplegia; Seizure Disorder Psychiatric Complaints and Symptoms: No Complaints or Symptoms Medical History: Negative for: Anorexia/bulimia; Confinement Anxiety HBO Extended History Items Eyes: Cataracts Immunizations Pneumococcal Vaccine: Received Pneumococcal Vaccination: No Implantable Devices Cregg, MAKARIOS MADLOCK ([id number]) Family and Social History Former smoker; Marital Status - Widowed; Alcohol Use: Rarely; Drug Use: No History; Caffeine Use: Never; Financial Concerns: No; Food, Clothing or Shelter Needs: No; Support System Lacking: No; Transportation Concerns: No;  Advanced Directives: Yes (Not Provided); Patient does not want information  on Advanced Directives; Do not resuscitate: No; Living Will: Yes (Not Provided); Medical Power of Attorney: Yes (Not Provided) Physician Affirmation I have reviewed and agree with the above information. Electronic Signature(s) Signed: 12/26/2017 5:11:36 PM By: Montey Hora Signed: 12/26/2017 5:33:52 PM By: Worthy Keeler PA-C Entered By: Worthy Keeler on 12/26/2017 14:31:52 [place]ette, Rose [name] ([id number]) -------------------------------------------------------------------------------- SuperBill Details Patient Name: Cullins, Qusai S. Date of Service: 12/26/2017 Medical Record Number: [id number] Patient Account Number: [id number] Date of Birth/Sex: [birth year] (84 y.o. 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 N18.9 Chronic kidney disease, unspecified I73.9 Peripheral vascular disease, unspecified Y64.15 Chronic systolic (congestive) heart failure I10 Essential (primary) hypertension Facility Procedures CPT4: Description Modifier Quantity Code [id number] - DEBRIDE WOUND 1ST 20 SQ CM OR < 1 ICD-10 Diagnosis Description 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 Physician Procedures CPT4: Description Modifier Quantity Code [id number] [id number] - WC PHYS LEVEL 4 - EST PT 25 1 ICD-10 Diagnosis Description E11.621 Type 2 diabetes mellitus with foot ulcer E11.40 Type 2 diabetes mellitus with diabetic neuropathy,  unspecified L97.512  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

## 2017-12-28 NOTE — Progress Notes (Signed)
[name], [name] ([id number]) Visit Report for 12/26/2017 Arrival Information Details Patient Name: [name], [name]. Date of Service: 12/26/2017 11:15 AM Medical Record Number: [id number] Patient Account Number: [id number] Date of Birth/Sex: 28-Sep-1924 (84 y.o. M) Treating RN: Secundino Ginger Primary Care Brain Honeycutt: Deborra Medina Other Clinician: Referring Karlissa Aron: Deborra Medina Treating Brihany Butch/Extender: Melburn Hake, HOYT Weeks in Treatment: 11 Visit Information History Since Last Visit Added or deleted any medications: No Patient Arrived: Walker Any new allergies or adverse reactions: No Arrival Time: 11:26 Had a fall or experienced change in No Accompanied By: daughter activities of daily living that may affect Transfer Assistance: None risk of falls: Patient Identification Verified: Yes Signs or symptoms of abuse/neglect since last visito No Secondary Verification Process Completed: Yes Hospitalized since last visit: No Implantable device outside of the clinic excluding No cellular tissue based products placed in the center since last visit: Has Dressing in Place as Prescribed: Yes Pain Present Now: No Electronic Signature(s) Signed: 12/26/2017 4:11:38 PM By: Secundino Ginger Entered By: Secundino Ginger on 12/26/2017 11:28:44 [name], [name] ([id number]) -------------------------------------------------------------------------------- Encounter Discharge Information Details Patient Name: [name], [name] Date of Service: 12/26/2017 11:15 AM Medical Record Number: [id number] Patient Account Number: [id number] Date of Birth/Sex: 03-Sep-1924 (84 y.o. M) Treating RN: Montey Hora Primary Care Takesha Steger: Deborra Medina Other Clinician: Referring Micole Delehanty: Deborra Medina Treating Rubyann Lingle/Extender: Melburn Hake, HOYT Weeks in Treatment: 11 Encounter Discharge Information Items Post Procedure Vitals Discharge Condition: Stable Temperature (F): 97.8 Ambulatory Status: Walker Pulse (bpm):  70 Discharge Destination: Home Respiratory Rate (breaths/min): 16 Transportation: Private Auto Blood Pressure (mmHg): 139/45 Accompanied By: daughter Schedule Follow-up Appointment: Yes Clinical Summary of Care: Electronic Signature(s) Signed: 12/26/2017 5:11:36 PM By: Montey Hora Entered By: Montey Hora on 12/26/2017 12:21:05 Hendriks, [name] ([id number]) -------------------------------------------------------------------------------- Lower Extremity Assessment Details Patient Name: [name], [name] Date of Service: 12/26/2017 11:15 AM Medical Record Number: [id number] Patient Account Number: [id number] Date of Birth/Sex: 02-18-1924 (84 y.o. M) Treating RN: Secundino Ginger Primary Care Tavares Levinson: Deborra Medina Other Clinician: Referring Eliel Dudding: Deborra Medina Treating Breunna Nordmann/Extender: Melburn Hake, HOYT Weeks in Treatment: 11 Edema Assessment Assessed: Shirlyn Goltz: No] Patrice Paradise: No] [Left: Edema] [Right: :] Calf Left: Right: Point of Measurement: 30 cm From Medial Instep cm cm Ankle Left: Right: Point of Measurement: 10 cm From Medial Instep cm cm Vascular Assessment Claudication: Claudication Assessment [Left:None] Pulses: Dorsalis Pedis Palpable: [Left:Yes] [Right:Yes] Posterior Tibial Extremity colors, hair growth, and conditions: Extremity Color: [Left:Normal] [Right:Normal] Hair Growth on Extremity: [Left:No] [Right:No] Temperature of Extremity: [Left:Warm] [Right:Warm] Capillary Refill: [Right:< 3 seconds] Electronic Signature(s) Signed: 12/26/2017 4:11:38 PM By: Secundino Ginger Entered By: Secundino Ginger on 12/26/2017 11:59:01 Roark, [name] ([id number]) -------------------------------------------------------------------------------- Multi Wound Chart Details Patient Name: [name], [name] Date of Service: 12/26/2017 11:15 AM Medical Record Number: [id number] Patient Account Number: [id number] Date of Birth/Sex: 1924-06-19 (84 y.o. M) Treating RN: Montey Hora Primary  Care Mieke Brinley: Deborra Medina Other Clinician: Referring Marquis Diles: Deborra Medina Treating Delta Deshmukh/Extender: Melburn Hake, HOYT Weeks in Treatment: 11 Vital Signs Height(in): 68 Pulse(bpm): 70 Weight(lbs): 180 Blood Pressure(mmHg): 139/45 Body Mass Index(BMI): 27 Temperature(F): 97.8 Respiratory Rate 18 (breaths/min): Photos: [1:No Photos] [3:No Photos] [4:No Photos] Wound Location: [1:Right Toe Second] [3:Left Toe Fifth - Lateral] [4:Left Foot - Dorsal, Medial] Wounding Event: [1:Gradually Appeared] [3:Not Known] [4:Trauma] Primary Etiology: [1:Diabetic Wound/Ulcer of the Lower Extremity] [3:Diabetic Wound/Ulcer of the Lower Extremity] [4:Diabetic Wound/Ulcer of the Lower Extremity] Secondary Etiology: [1:Arterial Insufficiency Ulcer] [3:Arterial Insufficiency Ulcer] [4:Arterial Insufficiency Ulcer] Comorbid History: [1:Cataracts, Lymphedema, Coronary Artery Disease, Hypertension,  Peripheral Venous Disease, Type II Diabetes, End Stage Renal Disease, Rheumatoid Arthritis, Neuropathy] [3:Cataracts, Lymphedema, Coronary Artery Disease, Hypertension,  Peripheral Venous Disease, Type II Diabetes, End Stage Renal Disease, Rheumatoid Arthritis, Neuropathy] [4:Cataracts, Lymphedema, Coronary Artery Disease, Hypertension, Peripheral Venous Disease, Type II Diabetes, End Stage Renal Disease, Rheumatoid  Arthritis, Neuropathy] Date Acquired: [1:06/04/2017] [3:08/05/2017] [4:11/20/2017] Weeks of Treatment: [1:11] [3:5] [4:4] Wound Status: [1:Open] [3:Open] [4:Open] Pending Amputation on [1:Yes] [3:No] [4:No] Presentation: Measurements L x W x D [1:0.8x0.9x0.3] [3:0.6x0.3x0.1] [4:1x0.6x0.2] (cm) Area (cm) : [1:0.565] [3:0.141] [4:0.471] Volume (cm) : [1:0.17] [3:0.014] [4:0.094] % Reduction in Area: [1:52.00%] [3:70.10%] [4:14.40%] % Reduction in Volume: [1:28.00%] [3:70.20%] [4:14.50%] Starting Position 1 [1:4] (o'clock): Ending Position 1 [1:9] (o'clock): Maximum Distance 1 (cm):  [1:0.3] Undermining: [1:Yes] [3:No] [4:No] Classification: [1:Grade 2] [3:Grade 2] [4:Grade 2] Exudate Amount: [1:Small] [3:Medium] [4:Small] Exudate Type: [1:Serous] [3:Serous] [4:Serous] Exudate Color: [1:amber] [3:amber] [4:amber] Wound Margin: [1:N/A] [3:Flat and Intact] [4:Flat and Intact] Granulation Amount: [1:None Present (0%)] [3:None Present (0%)] [4:None Present (0%)] Necrotic Amount: [1:Medium (34-66%)] [3:None Present (0%)] [4:Large (67-100%)] Necrotic Tissue: Adherent Slough N/A Eschar, Adherent Slough Exposed Structures: Fat Layer (Subcutaneous Fat Layer (Subcutaneous Fat Layer (Subcutaneous Tissue) Exposed: Yes Tissue) Exposed: Yes Tissue) Exposed: Yes Fascia: No Fascia: No Fascia: No Tendon: No Tendon: No Tendon: No Muscle: No Muscle: No Muscle: No Joint: No Joint: No Joint: No Bone: No Bone: No Bone: No Epithelialization: N/A None None Periwound Skin Texture: Callus: Yes No Abnormalities Noted Excoriation: No Excoriation: No Induration: No Induration: No Callus: No Crepitus: No Crepitus: No Rash: No Rash: No Scarring: No Scarring: No Periwound Skin Moisture: Maceration: No Maceration: Yes Maceration: No Dry/Scaly: No Dry/Scaly: No Dry/Scaly: No Periwound Skin Color: Atrophie Blanche: No No Abnormalities Noted Atrophie Blanche: No Cyanosis: No Cyanosis: No Ecchymosis: No Ecchymosis: No Erythema: No Erythema: No Hemosiderin Staining: No Hemosiderin Staining: No Mottled: No Mottled: No Pallor: No Pallor: No Rubor: No Rubor: No Temperature: N/A No Abnormality No Abnormality Tenderness on Palpation: No Yes No Wound Preparation: Ulcer Cleansing: Ulcer Cleansing: Ulcer Cleansing: Rinsed/Irrigated with Saline Rinsed/Irrigated with Saline Rinsed/Irrigated with Saline Topical Anesthetic Applied: Topical Anesthetic Applied: Topical Anesthetic Applied: Other: lidocaine 4% Other: lidocaine 4% None Wound Number: [id number]  Photos: No Photos  No Photos No Photos Wound Location: Left Toe Third Right Foot - Dorsal Right Ankle - Posterior Wounding Event: Surgical Injury Not Known Not Known Primary Etiology: Arterial Insufficiency Ulcer To be determined To be determined Secondary Etiology: N/A N/A N/A Comorbid History: Cataracts, Lymphedema, Cataracts, Lymphedema, Cataracts, Lymphedema, Coronary Artery Disease, Coronary Artery Disease, Coronary Artery Disease, Hypertension, Peripheral Hypertension, Peripheral Hypertension, Peripheral Venous Disease, Type II Venous Disease, Type II Venous Disease, Type II Diabetes, End Stage Renal Diabetes, End Stage Renal Diabetes, End Stage Renal Disease, Rheumatoid Arthritis, Disease, Rheumatoid Arthritis, Disease, Rheumatoid Arthritis, Neuropathy Neuropathy Neuropathy Date Acquired: 11/18/2017 12/25/2017 12/25/2017 Weeks of Treatment: 2 0 0 Wound Status: Open Open Open Pending Amputation on No No No Presentation: Measurements L x W x D 2.3x0.8x0.1 4.3x5.8x0.1 0.5x0.8x0.1 (cm) Area (cm) : 1.445 19.588 0.314 Volume (cm) : 0.145 1.959 0.031 % Reduction in Area: 38.70% 0.00% N/A % Reduction in Volume: 92.30% 0.00% N/A Undermining: No No No Wesely, KEMONTE ULLMAN. ([id number]) Classification: Unclassifiable Full Thickness Without Full Thickness Without Exposed Support Structures Exposed Support Structures Exudate Amount: Small Medium Small Exudate Type: Serous Serous Serous Exudate Color: amber amber amber Wound Margin: Indistinct, nonvisible N/A N/A Granulation Amount: None Present (0%) Small (1-33%) None Present (0%) Necrotic Amount:  Large (67-100%) Large (67-100%) None Present (0%) Necrotic Tissue: Adherent Pierpont N/A Exposed Structures: Fascia: No Fat Layer (Subcutaneous Fat Layer (Subcutaneous Fat Layer (Subcutaneous Tissue) Exposed: Yes Tissue) Exposed: Yes Tissue) Exposed: No Fascia: No Fascia: No Tendon: No Tendon: No Tendon: No Muscle: No Muscle: No Muscle:  No Joint: No Joint: No Joint: No Bone: No Bone: No Bone: No Epithelialization: None N/A N/A Periwound Skin Texture: Excoriation: No Excoriation: No Excoriation: No Induration: No Induration: No Induration: No Callus: No Callus: No Callus: No Crepitus: No Crepitus: No Crepitus: No Rash: No Rash: No Rash: No Scarring: No Scarring: No Scarring: No Periwound Skin Moisture: Maceration: No Maceration: No Maceration: No Dry/Scaly: No Dry/Scaly: No Dry/Scaly: No Periwound Skin Color: Atrophie Blanche: No Atrophie Blanche: No Atrophie Blanche: No Cyanosis: No Cyanosis: No Cyanosis: No Ecchymosis: No Ecchymosis: No Ecchymosis: No Erythema: No Erythema: No Erythema: No Hemosiderin Staining: No Hemosiderin Staining: No Hemosiderin Staining: No Mottled: No Mottled: No Mottled: No Pallor: No Pallor: No Pallor: No Rubor: No Rubor: No Rubor: No Temperature: N/A N/A N/A Tenderness on Palpation: No No No Wound Preparation: Ulcer Cleansing: Ulcer Cleansing: Ulcer Cleansing: Rinsed/Irrigated with Saline Rinsed/Irrigated with Saline, Rinsed/Irrigated with Saline, Other: soap and water Other: soap and water Topical Anesthetic Applied: None Topical Anesthetic Applied: Topical Anesthetic Applied: Other: lidocaine 4% Other: lidocaine 4% Treatment Notes Electronic Signature(s) Signed: 12/26/2017 5:11:36 PM By: Montey Hora Entered By: Montey Hora on 12/26/2017 12:02:47 [place], [name] ([id number]) -------------------------------------------------------------------------------- [place] Details Patient Name: [name], [name] Date of Service: 12/26/2017 11:15 AM Medical Record Number: [id number] Patient Account Number: [id number] Date of Birth/Sex: Jan 31, 1925 (84 y.o. M) Treating RN: Montey Hora Primary Care Billy Turvey: Deborra Medina Other Clinician: Referring Tania Perrott: Deborra Medina Treating Kelissa Merlin/Extender: Melburn Hake,  HOYT Weeks in Treatment: 11 Active Inactive ` Abuse / Safety / Falls / Self Care Management Nursing Diagnoses: Potential for falls Goals: Patient will remain injury free related to falls Date Initiated: 10/10/2017 Target Resolution Date: 01/02/2018 Goal Status: Active Interventions: Assess fall risk on admission and as needed Notes: ` Orientation to the Wound Care Program Nursing Diagnoses: Knowledge deficit related to the wound healing center program Goals: Patient/caregiver will verbalize understanding of the [place] Program Date Initiated: 10/10/2017 Target Resolution Date: 01/02/2018 Goal Status: Active Interventions: Provide education on orientation to the wound center Notes: ` Pain, Acute or Chronic Nursing Diagnoses: Potential alteration in comfort, pain Goals: Patient/caregiver will verbalize adequate pain control between visits Date Initiated: 10/10/2017 Target Resolution Date: 01/02/2018 Goal Status: Active Interventions: Petz, EZZARD DITMER ([id number]) Complete pain assessment as per visit requirements Notes: ` Wound/Skin Impairment Nursing Diagnoses: Impaired tissue integrity Goals: Ulcer/skin breakdown will heal within 14 weeks Date Initiated: 10/10/2017 Target Resolution Date: 01/02/2018 Goal Status: Active Interventions: Assess patient/caregiver ability to obtain necessary supplies Assess patient/caregiver ability to perform ulcer/skin care regimen upon admission and as needed Assess ulceration(s) every visit Notes: Electronic Signature(s) Signed: 12/26/2017 5:11:36 PM By: Montey Hora Entered By: Montey Hora on 12/26/2017 12:02:33 Sibilia, [name] ([id number]) -------------------------------------------------------------------------------- Pain Assessment Details Patient Name: [name], [name] Date of Service: 12/26/2017 11:15 AM Medical Record Number: [id number] Patient Account Number: [id number] Date of Birth/Sex: 11/15/1924  (84 y.o. M) Treating RN: Secundino Ginger Primary Care Gatlyn Lipari: Deborra Medina Other Clinician: Referring Toya Palacios: Deborra Medina Treating Claron Rosencrans/Extender: Melburn Hake, HOYT Weeks in Treatment: 11 Active Problems Location of Pain Severity and Description of Pain Patient Has Paino No Site Locations Pain Management and Medication Current Pain Management: Goals for  Pain Management pt denies any pain at this time. Electronic Signature(s) Signed: 12/26/2017 4:11:38 PM By: Secundino Ginger Entered By: Secundino Ginger on 12/26/2017 11:29:02 [name], [name] ([id number]) -------------------------------------------------------------------------------- Patient/Caregiver Education Details Patient Name: Abbasi, [name]. Date of Service: 12/26/2017 11:15 AM Medical Record Number: [id number] Patient Account Number: [id number] Date of Birth/Gender: [birth year] (84 y.o. M) Treating RN: Montey Hora Primary Care Physician: Deborra Medina Other Clinician: Referring Physician: Deborra Medina Treating Physician/Extender: Sharalyn Ink in Treatment: 11 Education Assessment Education Provided To: Patient and Caregiver Education Topics Provided Wound/Skin Impairment: Handouts: Other: wound care as ordered Methods: Demonstration, Explain/Verbal Responses: State content correctly Electronic Signature(s) Signed: 12/26/2017 5:11:36 PM By: Montey Hora Entered By: Montey Hora on 12/26/2017 12:16:43 Huffstetler, [name] ([id number]) -------------------------------------------------------------------------------- Wound Assessment Details Patient Name: [name], [name] Date of Service: 12/26/2017 11:15 AM Medical Record Number: [id number] Patient Account Number: [id number] Date of Birth/Sex: 16-Dec-1924 (84 y.o. M) Treating RN: Secundino Ginger Primary Care Oluwadarasimi Redmon: Deborra Medina Other Clinician: Referring Merrell Rettinger: Deborra Medina Treating Kimmy Totten/Extender: Melburn Hake, HOYT Weeks in Treatment: 11 Wound  Status Wound Number: 1 Primary Diabetic Wound/Ulcer of the Lower Extremity Etiology: Wound Location: Right Toe Second Secondary Arterial Insufficiency Ulcer Wounding Event: Gradually Appeared Etiology: Date Acquired: 06/04/2017 Wound Open Weeks Of Treatment: 11 Status: Clustered Wound: No Comorbid Cataracts, Lymphedema, Coronary Artery Pending Amputation On Presentation History: Disease, Hypertension, Peripheral Venous Disease, Type II Diabetes, End Stage Renal Disease, Rheumatoid Arthritis, Neuropathy Photos Photo Uploaded By: Secundino Ginger on 12/26/2017 12:25:49 Wound Measurements Length: (cm) 0.8 % Reductio Width: (cm) 0.9 % Reductio Depth: (cm) 0.3 Tunneling: Area: (cm) 0.565 Undermini Volume: (cm) 0.17 Starti Ending Maximum n in Area: 52% n in Volume: 28% No ng: Yes ng Position (o'clock): 4 Position (o'clock): 9 Distance: (cm) 0.3 Wound Description Classification: Grade 2 Exudate Amount: Small Exudate Type: Serous Exudate Color: amber Wound Bed Granulation Amount: None Present (0%) Exposed Structure Necrotic Amount: Medium (34-66%) Fascia Exposed: No Necrotic Quality: Adherent Slough Fat Layer (Subcutaneous Tissue) Exposed: Yes Tendon Exposed: No Behan, Gio S. ([id number]) Muscle Exposed: No Joint Exposed: No Bone Exposed: No Periwound Skin Texture Texture Color No Abnormalities Noted: No No Abnormalities Noted: No Callus: Yes Atrophie Blanche: No Crepitus: No Cyanosis: No Excoriation: No Ecchymosis: No Induration: No Erythema: No Rash: No Hemosiderin Staining: No Scarring: No Mottled: No Pallor: No Moisture Rubor: No No Abnormalities Noted: No Dry / Scaly: No Maceration: No Wound Preparation Ulcer Cleansing: Rinsed/Irrigated with Saline Topical Anesthetic Applied: Other: lidocaine 4%, Treatment Notes Wound #1 (Right Toe Second) Notes L 3rd toe and dorsal foot - iodoflex, all others silvercel gauze, conform and tubigrip Electronic  Signature(s) Signed: 12/26/2017 4:11:38 PM By: Secundino Ginger Entered By: Secundino Ginger on 12/26/2017 11:38:09 [name], [name] ([id number]) -------------------------------------------------------------------------------- Wound Assessment Details Patient Name: [name], [name] Date of Service: 12/26/2017 11:15 AM Medical Record Number: [id number] Patient Account Number: [id number] Date of Birth/Sex: Jan 11, 1925 (84 y.o. M) Treating RN: Secundino Ginger Primary Care Hiba Garry: Deborra Medina Other Clinician: Referring Etter Royall: Deborra Medina Treating Jaun Galluzzo/Extender: STONE III, HOYT Weeks in Treatment: 11 Wound Status Wound Number: 3 Primary Diabetic Wound/Ulcer of the Lower Extremity Etiology: Wound Location: Left Toe Fifth - Lateral Secondary Arterial Insufficiency Ulcer Wounding Event: Not Known Etiology: Date Acquired: 08/05/2017 Wound Open Weeks Of Treatment: 5 Status: Clustered Wound: No Comorbid Cataracts, Lymphedema, Coronary Artery History: Disease, Hypertension, Peripheral Venous Disease, Type II Diabetes, End Stage Renal Disease, Rheumatoid Arthritis, Neuropathy Photos Photo Uploaded By: Secundino Ginger on 12/26/2017 12:26:20 Wound Measurements  Length: (cm) 0.6 % Reduct Width: (cm) 0.3 % Reduct Depth: (cm) 0.1 Epitheli Area: (cm) 0.141 Tunneli Volume: (cm) 0.014 Undermi ion in Area: 70.1% ion in Volume: 70.2% alization: None ng: No ning: No Wound Description Classification: Grade 2 Wound Margin: Flat and Intact Exudate Amount: Medium Exudate Type: Serous Exudate Color: amber Foul Odor After Cleansing: No Slough/Fibrino Yes Wound Bed Granulation Amount: None Present (0%) Exposed Structure Necrotic Amount: None Present (0%) Fascia Exposed: No Fat Layer (Subcutaneous Tissue) Exposed: Yes Tendon Exposed: No Muscle Exposed: No Joint Exposed: No [name], Taiki S. ([id number]) Bone Exposed: No Periwound Skin Texture Texture Color No Abnormalities Noted: Yes No  Abnormalities Noted: Yes Moisture Temperature / Pain No Abnormalities Noted: No Temperature: No Abnormality Dry / Scaly: No Tenderness on Palpation: Yes Maceration: Yes Wound Preparation Ulcer Cleansing: Rinsed/Irrigated with Saline Topical Anesthetic Applied: Other: lidocaine 4%, Treatment Notes Wound #3 (Left, Lateral Toe Fifth) Notes L 3rd toe and dorsal foot - iodoflex, all others silvercel gauze, conform and tubigrip Electronic Signature(s) Signed: 12/26/2017 4:11:38 PM By: Secundino Ginger Entered By: Secundino Ginger on 12/26/2017 11:42:36 [place], [name] ([id number]) -------------------------------------------------------------------------------- Wound Assessment Details Patient Name: Tash, Srihith S. Date of Service: 12/26/2017 11:15 AM Medical Record Number: [id number] Patient Account Number: [id number] Date of Birth/Sex: Nov 30, 1924 (84 y.o. M) Treating RN: Secundino Ginger Primary Care Seanpatrick Maisano: Deborra Medina Other Clinician: Referring Yajahira Tison: Deborra Medina Treating Sally Menard/Extender: Melburn Hake, HOYT Weeks in Treatment: 11 Wound Status Wound Number: 4 Primary Diabetic Wound/Ulcer of the Lower Extremity Etiology: Wound Location: Left Foot - Dorsal, Medial Secondary Arterial Insufficiency Ulcer Wounding Event: Trauma Etiology: Date Acquired: 11/20/2017 Wound Open Weeks Of Treatment: 4 Status: Clustered Wound: No Comorbid Cataracts, Lymphedema, Coronary Artery History: Disease, Hypertension, Peripheral Venous Disease, Type II Diabetes, End Stage Renal Disease, Rheumatoid Arthritis, Neuropathy Photos Photo Uploaded By: Secundino Ginger on 12/26/2017 12:27:59 Wound Measurements Length: (cm) 1 Width: (cm) 0.6 Depth: (cm) 0.2 Area: (cm) 0.471 Volume: (cm) 0.094 % Reduction in Area: 14.4% % Reduction in Volume: 14.5% Epithelialization: None Tunneling: No Undermining: No Wound Description Classification: Grade 2 Foul Odor Wound Margin: Flat and Intact Slough/Fib Exudate  Amount: Small Exudate Type: Serous Exudate Color: amber After Cleansing: No rino Yes Wound Bed Granulation Amount: None Present (0%) Exposed Structure Necrotic Amount: Large (67-100%) Fascia Exposed: No Necrotic Quality: Eschar, Adherent Slough Fat Layer (Subcutaneous Tissue) Exposed: Yes Tendon Exposed: No Muscle Exposed: No Joint Exposed: No Schoff, Seyed S. ([id number]) Bone Exposed: No Periwound Skin Texture Texture Color No Abnormalities Noted: No No Abnormalities Noted: No Callus: No Atrophie Blanche: No Crepitus: No Cyanosis: No Excoriation: No Ecchymosis: No Induration: No Erythema: No Rash: No Hemosiderin Staining: No Scarring: No Mottled: No Pallor: No Moisture Rubor: No No Abnormalities Noted: No Dry / Scaly: No Temperature / Pain Maceration: No Temperature: No Abnormality Wound Preparation Ulcer Cleansing: Rinsed/Irrigated with Saline Topical Anesthetic Applied: None Treatment Notes Wound #4 (Left, Medial, Dorsal Foot) Notes L 3rd toe and dorsal foot - iodoflex, all others silvercel gauze, conform and tubigrip Electronic Signature(s) Signed: 12/26/2017 4:11:38 PM By: Secundino Ginger Entered By: Secundino Ginger on 12/26/2017 11:45:05 [place], [name] ([id number]) -------------------------------------------------------------------------------- Wound Assessment Details Patient Name: Leveille, Izaih S. Date of Service: 12/26/2017 11:15 AM Medical Record Number: [id number] Patient Account Number: [id number] Date of Birth/Sex: 12-May-1924 (84 y.o. M) Treating RN: Secundino Ginger Primary Care Damary Doland: Deborra Medina Other Clinician: Referring Besse Miron: Deborra Medina Treating Marlen Mollica/Extender: STONE III, HOYT Weeks in Treatment: 11 Wound Status Wound Number: 5 Primary Arterial Insufficiency Ulcer  Etiology: Wound Location: Left Toe Third Wound Open Wounding Event: Surgical Injury Status: Date Acquired: 11/18/2017 Comorbid Cataracts, Lymphedema, Coronary  Artery Weeks Of Treatment: 2 History: Disease, Hypertension, Peripheral Venous Clustered Wound: No Disease, Type II Diabetes, End Stage Renal Disease, Rheumatoid Arthritis, Neuropathy Photos Photo Uploaded By: Secundino Ginger on 12/26/2017 12:28:39 Wound Measurements Length: (cm) 2.3 Width: (cm) 0.8 Depth: (cm) 0.1 Area: (cm) 1.445 Volume: (cm) 0.145 % Reduction in Area: 38.7% % Reduction in Volume: 92.3% Epithelialization: None Tunneling: No Undermining: No Wound Description Classification: Unclassifiable Foul Odor Wound Margin: Indistinct, nonvisible Slough/Fib Exudate Amount: Small Exudate Type: Serous Exudate Color: amber After Cleansing: No rino Yes Wound Bed Granulation Amount: None Present (0%) Exposed Structure Necrotic Amount: Large (67-100%) Fascia Exposed: No Necrotic Quality: Adherent Slough Fat Layer (Subcutaneous Tissue) Exposed: No Tendon Exposed: No Muscle Exposed: No Joint Exposed: No Bone Exposed: No Fricker, Jalien S. ([id number]) Periwound Skin Texture Texture Color No Abnormalities Noted: No No Abnormalities Noted: No Callus: No Atrophie Blanche: No Crepitus: No Cyanosis: No Excoriation: No Ecchymosis: No Induration: No Erythema: No Rash: No Hemosiderin Staining: No Scarring: No Mottled: No Pallor: No Moisture Rubor: No No Abnormalities Noted: No Dry / Scaly: No Maceration: No Wound Preparation Ulcer Cleansing: Rinsed/Irrigated with Saline Topical Anesthetic Applied: None Treatment Notes Wound #5 (Left Toe Third) Notes L 3rd toe and dorsal foot - iodoflex, all others silvercel gauze, conform and tubigrip Electronic Signature(s) Signed: 12/26/2017 4:11:38 PM By: Secundino Ginger Entered By: Secundino Ginger on 12/26/2017 11:47:24 Slemmer, [name] ([id number]) -------------------------------------------------------------------------------- Wound Assessment Details Patient Name: Philbert, Masaki S. Date of Service: 12/26/2017 11:15  AM Medical Record Number: [id number] Patient Account Number: [id number] Date of Birth/Sex: Apr 14, 1924 (84 y.o. M) Treating RN: Secundino Ginger Primary Care Copeland Lapier: Deborra Medina Other Clinician: Referring Roy Tokarz: Deborra Medina Treating Isaiha Asare/Extender: Melburn Hake, HOYT Weeks in Treatment: 11 Wound Status Wound Number: 6 Primary To be determined Etiology: Wound Location: Right Foot - Dorsal Wound Open Wounding Event: Not Known Status: Date Acquired: 12/25/2017 Comorbid Cataracts, Lymphedema, Coronary Artery Weeks Of Treatment: 0 History: Disease, Hypertension, Peripheral Venous Clustered Wound: No Disease, Type II Diabetes, End Stage Renal Disease, Rheumatoid Arthritis, Neuropathy Photos Photo Uploaded By: Secundino Ginger on 12/26/2017 12:29:26 Wound Measurements Length: (cm) 4.3 Width: (cm) 5.8 Depth: (cm) 0.1 Area: (cm) 19.588 Volume: (cm) 1.959 % Reduction in Area: 0% % Reduction in Volume: 0% Tunneling: No Undermining: No Wound Description Full Thickness Without Exposed Support Foul Classification: Structures Slou Exudate Medium Amount: Exudate Type: Serous Exudate Color: amber Odor After Cleansing: No gh/Fibrino Yes Wound Bed Granulation Amount: Small (1-33%) Exposed Structure Necrotic Amount: Large (67-100%) Fascia Exposed: No Necrotic Quality: Adherent Slough Fat Layer (Subcutaneous Tissue) Exposed: Yes Tendon Exposed: No Muscle Exposed: No Joint Exposed: No Bone Exposed: No Newville, Masaki S. ([id number]) Periwound Skin Texture Texture Color No Abnormalities Noted: No No Abnormalities Noted: No Callus: No Atrophie Blanche: No Crepitus: No Cyanosis: No Excoriation: No Ecchymosis: No Induration: No Erythema: No Rash: No Hemosiderin Staining: No Scarring: No Mottled: No Pallor: No Moisture Rubor: No No Abnormalities Noted: No Dry / Scaly: No Maceration: No Wound Preparation Ulcer Cleansing: Rinsed/Irrigated with Saline, Other: soap and  water, Topical Anesthetic Applied: Other: lidocaine 4%, Treatment Notes Wound #6 (Right, Dorsal Foot) Notes L 3rd toe and dorsal foot - iodoflex, all others silvercel gauze, conform and tubigrip Electronic Signature(s) Signed: 12/26/2017 4:11:38 PM By: Secundino Ginger Entered By: Secundino Ginger on 12/26/2017 11:53:30 Taras, [name] ([id number]) -------------------------------------------------------------------------------- Wound Assessment Details Patient Name: Tozzi,  Delano S. Date of Service: 12/26/2017 11:15 AM Medical Record Number: [id number] Patient Account Number: [id number] Date of Birth/Sex: May 30, 1924 (84 y.o. M) Treating RN: Secundino Ginger Primary Care Laquashia Mergenthaler: Deborra Medina Other Clinician: Referring Hanaa Payes: Deborra Medina Treating Zayn Selley/Extender: Melburn Hake, HOYT Weeks in Treatment: 11 Wound Status Wound Number: 7 Primary To be determined Etiology: Wound Location: Right Ankle - Posterior Wound Open Wounding Event: Not Known Status: Date Acquired: 12/25/2017 Comorbid Cataracts, Lymphedema, Coronary Artery Weeks Of Treatment: 0 History: Disease, Hypertension, Peripheral Venous Clustered Wound: No Disease, Type II Diabetes, End Stage Renal Disease, Rheumatoid Arthritis, Neuropathy Photos Photo Uploaded By: Secundino Ginger on 12/26/2017 12:29:57 Wound Measurements Length: (cm) 0.5 Width: (cm) 0.8 Depth: (cm) 0.1 Area: (cm) 0.314 Volume: (cm) 0.031 % Reduction in Area: % Reduction in Volume: Tunneling: No Undermining: No Wound Description Full Thickness Without Exposed Support Foul Classification: Structures Slou Exudate Small Amount: Exudate Type: Serous Exudate Color: amber Odor After Cleansing: No gh/Fibrino No Wound Bed Granulation Amount: None Present (0%) Exposed Structure Necrotic Amount: None Present (0%) Fascia Exposed: No Fat Layer (Subcutaneous Tissue) Exposed: Yes Tendon Exposed: No Muscle Exposed: No Joint Exposed: No Bone Exposed:  No Eveland, Gerasimos S. ([id number]) Periwound Skin Texture Texture Color No Abnormalities Noted: No No Abnormalities Noted: No Callus: No Atrophie Blanche: No Crepitus: No Cyanosis: No Excoriation: No Ecchymosis: No Induration: No Erythema: No Rash: No Hemosiderin Staining: No Scarring: No Mottled: No Pallor: No Moisture Rubor: No No Abnormalities Noted: No Dry / Scaly: No Maceration: No Wound Preparation Ulcer Cleansing: Rinsed/Irrigated with Saline, Other: soap and water, Topical Anesthetic Applied: Other: lidocaine 4%, Treatment Notes Wound #7 (Right, Posterior Ankle) Notes L 3rd toe and dorsal foot - iodoflex, all others silvercel gauze, conform and tubigrip Electronic Signature(s) Signed: 12/26/2017 4:11:38 PM By: Secundino Ginger Entered By: Secundino Ginger on 12/26/2017 11:57:59 Mcgroarty, [name] ([id number]) -------------------------------------------------------------------------------- [place] Details Patient Name: Register, Maxey S. Date of Service: 12/26/2017 11:15 AM Medical Record Number: [id number] Patient Account Number: [id number] Date of Birth/Sex: Jun 22, 1924 (84 y.o. M) Treating RN: Secundino Ginger Primary Care Gwenevere Goga: Deborra Medina Other Clinician: Referring Zale Marcotte: Deborra Medina Treating Zorana Brockwell/Extender: Melburn Hake, HOYT Weeks in Treatment: 11 Vital Signs Time Taken: 11:30 Temperature (F): 97.8 Height (in): 68 Pulse (bpm): 70 Weight (lbs): 180 Respiratory Rate (breaths/min): 18 Body Mass Index (BMI): 27.4 Blood Pressure (mmHg): 139/45 Reference Range: 80 - 120 mg / dl Electronic Signature(s) Signed: 12/26/2017 4:11:38 PM By: Secundino Ginger Entered By: Secundino Ginger on 12/26/2017 11:30:18

## 2017-12-29 DIAGNOSIS — I70203 Unspecified atherosclerosis of native arteries of extremities, bilateral legs: Secondary | ICD-10-CM | POA: Diagnosis not present

## 2017-12-29 DIAGNOSIS — L97513 Non-pressure chronic ulcer of other part of right foot with necrosis of muscle: Secondary | ICD-10-CM | POA: Diagnosis not present

## 2017-12-29 DIAGNOSIS — I10 Essential (primary) hypertension: Secondary | ICD-10-CM | POA: Diagnosis not present

## 2017-12-29 DIAGNOSIS — E1152 Type 2 diabetes mellitus with diabetic peripheral angiopathy with gangrene: Secondary | ICD-10-CM | POA: Diagnosis not present

## 2017-12-29 DIAGNOSIS — E11621 Type 2 diabetes mellitus with foot ulcer: Secondary | ICD-10-CM | POA: Diagnosis not present

## 2017-12-29 DIAGNOSIS — Z794 Long term (current) use of insulin: Secondary | ICD-10-CM | POA: Diagnosis not present

## 2017-12-31 DIAGNOSIS — E1152 Type 2 diabetes mellitus with diabetic peripheral angiopathy with gangrene: Secondary | ICD-10-CM | POA: Diagnosis not present

## 2017-12-31 DIAGNOSIS — E11621 Type 2 diabetes mellitus with foot ulcer: Secondary | ICD-10-CM | POA: Diagnosis not present

## 2017-12-31 DIAGNOSIS — I70203 Unspecified atherosclerosis of native arteries of extremities, bilateral legs: Secondary | ICD-10-CM | POA: Diagnosis not present

## 2017-12-31 DIAGNOSIS — Z794 Long term (current) use of insulin: Secondary | ICD-10-CM | POA: Diagnosis not present

## 2017-12-31 DIAGNOSIS — E1142 Type 2 diabetes mellitus with diabetic polyneuropathy: Secondary | ICD-10-CM

## 2017-12-31 DIAGNOSIS — L97513 Non-pressure chronic ulcer of other part of right foot with necrosis of muscle: Secondary | ICD-10-CM | POA: Diagnosis not present

## 2017-12-31 DIAGNOSIS — I10 Essential (primary) hypertension: Secondary | ICD-10-CM | POA: Diagnosis not present

## 2018-01-02 DIAGNOSIS — Z794 Long term (current) use of insulin: Secondary | ICD-10-CM | POA: Diagnosis not present

## 2018-01-02 DIAGNOSIS — I70203 Unspecified atherosclerosis of native arteries of extremities, bilateral legs: Secondary | ICD-10-CM | POA: Diagnosis not present

## 2018-01-02 DIAGNOSIS — I10 Essential (primary) hypertension: Secondary | ICD-10-CM | POA: Diagnosis not present

## 2018-01-02 DIAGNOSIS — E11621 Type 2 diabetes mellitus with foot ulcer: Secondary | ICD-10-CM | POA: Diagnosis not present

## 2018-01-02 DIAGNOSIS — L97513 Non-pressure chronic ulcer of other part of right foot with necrosis of muscle: Secondary | ICD-10-CM | POA: Diagnosis not present

## 2018-01-02 DIAGNOSIS — E1152 Type 2 diabetes mellitus with diabetic peripheral angiopathy with gangrene: Secondary | ICD-10-CM | POA: Diagnosis not present

## 2018-01-05 ENCOUNTER — Telehealth: Payer: Self-pay

## 2018-01-05 NOTE — Telephone Encounter (Signed)
Copied from [place] [id number]. Topic: General - Other >> Jan 02, 2018 10:57 AM Janace Aris A wrote: Reason for CRM: [facility] in wanting to inquire on a medication specific ingredient, they say they want to make sure that the provider intended to send DMSO with the medication Dimethylfulside.

## 2018-01-05 NOTE — Telephone Encounter (Signed)
Yes, I did. 

## 2018-01-05 NOTE — Telephone Encounter (Signed)
Pharmacy has been informed.

## 2018-01-06 ENCOUNTER — Ambulatory Visit: Payer: Medicare Other | Admitting: Pain Medicine

## 2018-01-06 DIAGNOSIS — Z794 Long term (current) use of insulin: Secondary | ICD-10-CM | POA: Diagnosis not present

## 2018-01-06 DIAGNOSIS — E11621 Type 2 diabetes mellitus with foot ulcer: Secondary | ICD-10-CM | POA: Diagnosis not present

## 2018-01-06 DIAGNOSIS — I70203 Unspecified atherosclerosis of native arteries of extremities, bilateral legs: Secondary | ICD-10-CM | POA: Diagnosis not present

## 2018-01-06 DIAGNOSIS — L97513 Non-pressure chronic ulcer of other part of right foot with necrosis of muscle: Secondary | ICD-10-CM | POA: Diagnosis not present

## 2018-01-06 DIAGNOSIS — E1152 Type 2 diabetes mellitus with diabetic peripheral angiopathy with gangrene: Secondary | ICD-10-CM | POA: Diagnosis not present

## 2018-01-06 DIAGNOSIS — I10 Essential (primary) hypertension: Secondary | ICD-10-CM | POA: Diagnosis not present

## 2018-01-07 ENCOUNTER — Encounter: Payer: Self-pay | Admitting: Internal Medicine

## 2018-01-07 ENCOUNTER — Ambulatory Visit (INDEPENDENT_AMBULATORY_CARE_PROVIDER_SITE_OTHER): Payer: Medicare Other | Admitting: Internal Medicine

## 2018-01-07 VITALS — BP 136/62 | HR 81 | Temp 97.7°F | Resp 16 | Ht 68.0 in | Wt 181.0 lb

## 2018-01-07 DIAGNOSIS — I350 Nonrheumatic aortic (valve) stenosis: Secondary | ICD-10-CM

## 2018-01-07 DIAGNOSIS — M62838 Other muscle spasm: Secondary | ICD-10-CM | POA: Diagnosis not present

## 2018-01-07 DIAGNOSIS — R609 Edema, unspecified: Secondary | ICD-10-CM | POA: Diagnosis not present

## 2018-01-07 DIAGNOSIS — G8929 Other chronic pain: Secondary | ICD-10-CM | POA: Diagnosis not present

## 2018-01-07 DIAGNOSIS — Z794 Long term (current) use of insulin: Secondary | ICD-10-CM

## 2018-01-07 DIAGNOSIS — M79605 Pain in left leg: Secondary | ICD-10-CM | POA: Diagnosis not present

## 2018-01-07 DIAGNOSIS — E1121 Type 2 diabetes mellitus with diabetic nephropathy: Secondary | ICD-10-CM

## 2018-01-07 DIAGNOSIS — M79604 Pain in right leg: Secondary | ICD-10-CM | POA: Diagnosis not present

## 2018-01-07 DIAGNOSIS — I7025 Atherosclerosis of native arteries of other extremities with ulceration: Secondary | ICD-10-CM

## 2018-01-07 DIAGNOSIS — G629 Polyneuropathy, unspecified: Secondary | ICD-10-CM | POA: Diagnosis not present

## 2018-01-07 LAB — BASIC METABOLIC PANEL
BUN: 51 mg/dL — ABNORMAL HIGH (ref 6–23)
CO2: 23 meq/L (ref 19–32)
Calcium: 8.9 mg/dL (ref 8.4–10.5)
Chloride: 110 mEq/L (ref 96–112)
Creatinine, Ser: 2.06 mg/dL — ABNORMAL HIGH (ref 0.40–1.50)
GFR: 32.3 mL/min — ABNORMAL LOW (ref >60.00)
GLUCOSE: 190 mg/dL — AB (ref 70–99)
POTASSIUM: 4.8 meq/L (ref 3.5–5.1)
SODIUM: 142 meq/L (ref 135–145)

## 2018-01-07 LAB — MAGNESIUM: MAGNESIUM: 2.1 mg/dL (ref 1.5–2.5)

## 2018-01-07 MED ORDER — TORSEMIDE 20 MG PO TABS: 20.0000 mg | ORAL_TABLET | Freq: Every day | ORAL | 1 refills | Status: DC

## 2018-01-07 MED ORDER — FENTANYL 25 MCG/HR TD PT72: 25.0000 ug | MEDICATED_PATCH | TRANSDERMAL | 0 refills | Status: DC

## 2018-01-07 MED ORDER — BACLOFEN 10 MG PO TABS: 10.0000 mg | ORAL_TABLET | Freq: Three times a day (TID) | ORAL | 0 refills | Status: DC

## 2018-01-07 NOTE — Progress Notes (Signed)
Subjective:  Patient ID: [name], male    DOB: January 04, 1925  Age: 84 y.o. MRN: [id number]  CC: The primary encounter diagnosis was Edema, unspecified type. Diagnoses of Muscle spasm, Chronic lower extremity pain (Primary Area of Pain) (Bilateral) (L>R), Type 2 diabetes mellitus with diabetic nephropathy, with long-term current use of insulin ([place]), and Nonrheumatic aortic valve stenosis were also pertinent to this visit.  HPI KEMO SPRUCE presents for follow up on chronic lower extremity pain . Patient is accompanied by son.  He has a history of  Complicated by diabetic foot ulcers, CKD stage 4 and diabetic neuropathy .  His pain has been inadequately controlled due to multiple drug intolerances and new onset severe upper leg muscle spasms  which have been occurring  for the past several months . The spasms have become severe and cause the left leg to contract along the lateral thigh .  daughter recently requested a topical compounded medication which was sent to [facility]. The medication contains diclofenac , bupivicaine, and dimethyl sulfide . He has used it 3 times,  And states that he has felt no improvement in pain yet .   Right 2nd toe and left 3rd toe ulcers managed by wound care   Saw pain specialist Dr Dossie Arbour on Nov 21 LS sympathetic block at  The  L3 and L4 vertebral bodies  Plavix was held and restarted that evening per instructions Next appt is Dec 9 with Naveirra .  Did not have an appreciable difference In pain after the procedure. Still describes it as excruciating in the left leg.  Has a history of mild back pain on the left about a month ago, only lasted for 2-3 days  History of lumbar surgery in 2012 in [place] dr Melrose Nakayama today nerve conduction study postponed from Nov 25 due to edema. Marland Kitchen MRI of lumbar spine planned at [facility] since he has a pacemaker.    Has been monitoring  His fluid intake and averaging about  70 ounces daily  Intake includes Lots of seltzer water.   He is Retaining fluid .  Takes 40 mg furosemide daily , a stable dose.  Has not seen nephrology in years by choice, because he has  no intention of accepting hemodialysis     Morning CBG 's have been  under 120 with a low of 78   Outpatient Medications Prior to Visit  Medication Sig Dispense Refill  . acetaminophen (TYLENOL) 325 MG tablet Take 2 tablets (650 mg total) by mouth every 6 (six) hours as needed for mild pain (or Fever >/= 101).    Marland Kitchen amLODipine (NORVASC) 10 MG tablet TAKE 1 TABLET (10 MG TOTAL) BY MOUTH DAILY. (Patient taking differently: Take 10 mg by mouth every morning. ) 90 tablet 2  . aspirin 325 MG tablet Take 325 mg by mouth every evening.     Marland Kitchen atorvastatin (LIPITOR) 20 MG tablet TAKE 1 TABLET (20 MG TOTAL) BY MOUTH DAILY. (Patient taking differently: Take 20 mg by mouth daily at 6 PM. ) 90 tablet 1  . Calcium Carbonate-Vitamin D (CALCIUM + D) 600-200 MG-UNIT TABS Take 1 tablet by mouth 2 (two) times daily.      . clopidogrel (PLAVIX) 75 MG tablet TAKE 1 TABLET (75 MG TOTAL) BY MOUTH DAILY. 90 tablet 1  . collagenase (SANTYL) ointment Apply to wound twice a day (Patient taking differently: Apply 1 application topically daily. Apply to wound twice a day) 30 g 1  .  cyanocobalamin (,VITAMIN B-12,) 1000 MCG/ML injection INJECT 1 ML (1,000 MCG TOTAL) INTO THE MUSCLE EVERY 30 (THIRTY) DAYS. 3 mL 3  . fenofibrate (TRICOR) 48 MG tablet TAKE 1 TABLET BY MOUTH DAILY (Patient taking differently: Take 48 mg by mouth every morning. ) 90 tablet 1  . furosemide (LASIX) 20 MG tablet Take 2 tablets (40 mg total) by mouth daily. AS NEEDED FOR FLUID RETENTION (Patient taking differently: Take 20 mg by mouth 2 (two) times daily. ) 180 tablet 1  . glucose blood (ACCU-CHEK AVIVA PLUS) test strip CHECK BLOOD SUGAR 3 TIMES A DAY AS NEEDED DX E11.51 300 each 5  . insulin aspart protamine - aspart (NOVOLOG MIX 70/30 FLEXPEN) (70-30) 100 UNIT/ML FlexPen INJECT 18 units two times daily , adjust dose using  sliding scale 75 pen 1  . Insulin Pen Needle (B-D ULTRAFINE III SHORT PEN) 31G X 8 MM MISC USE AS DIRECTED 3 TIMES A DAY 300 each 5  . Insulin Syringe-Needle U-100 (B-D INS SYRINGE 0.5CC/31GX5/16) 31G X 5/16" 0.5 ML MISC 1 application by Does not apply route 3 (three) times daily before meals. 300 each 3  . latanoprost (XALATAN) 0.005 % ophthalmic solution Place 1 drop into both eyes at bedtime.  3  . losartan (COZAAR) 100 MG tablet TAKE 1 TABLET BY MOUTH EVERY DAY (Patient taking differently: Take 100 mg by mouth every morning. ) 90 tablet 1  . metoprolol tartrate (LOPRESSOR) 100 MG tablet TAKE 1 TABLET BY MOUTH TWICE A DAY 180 tablet 1  . Multiple Vitamin (MULTIVITAMIN) tablet Take 1 tablet by mouth every evening.     . polyethylene glycol (MIRALAX / GLYCOLAX) packet Take 17 g by mouth daily as needed for mild constipation. 14 each 0  . protein supplement shake (PREMIER PROTEIN) LIQD Take 325 mLs (11 oz total) by mouth 2 (two) times daily between meals. 60 Can 0  . silver sulfADIAZINE (SILVADENE) 1 % cream Apply 1 application topically daily. Apply amputation site and small toe daily 50 g 0  . Syringe, Disposable, 3 ML MISC For use with B12 injections weekly/monthly 25 each 0  . SYRINGE-NEEDLE, DISP, 3 ML (B-D 3CC LUER-LOK SYR 25GX1") 25G X 1" 3 ML MISC FOR USE WITH B12 INJECTIONS WEEKLY/MONTHLY 50 each 1  . amitriptyline (ELAVIL) 25 MG tablet Take 1 tablet (25 mg total) by mouth at bedtime. Increase as directed by doctor  to maximal dose 100 mg daily (Patient not taking: Reported on 12/25/2017) 90 tablet 1  . hydrocortisone 2.5 % cream Apply 1 application topically 2 (two) times daily as needed.     No facility-administered medications prior to visit.     Review of Systems;  Patient denies headache, fevers, malaise, unintentional weight loss, skin rash, eye pain, sinus congestion and sinus pain, sore throat, dysphagia,  hemoptysis , cough, dyspnea, wheezing, chest pain, palpitations,  orthopnea, edema, abdominal pain, nausea, melena, diarrhea, constipation, flank pain, dysuria, hematuria, urinary  Frequency, nocturia, numbness, tingling, seizures,  Focal weakness, Loss of consciousness,  Tremor, insomnia, depression, anxiety, and suicidal ideation.      Objective:  BP 136/62 (BP Location: Left Arm, Patient Position: Sitting, Cuff Size: Normal)   Pulse 81   Temp 97.7 F (36.5 C) (Oral)   Resp 16   Ht 5\' 8"  (1.727 m)   Wt 181 lb (82.1 kg)   SpO2 98%   BMI 27.52 kg/m   BP Readings from Last 3 Encounters:  01/07/18 136/62  12/25/17 (!) 148/54  12/15/17 Marland Kitchen)  125/47    Wt Readings from Last 3 Encounters:  01/07/18 181 lb (82.1 kg)  12/25/17 166 lb (75.3 kg)  12/15/17 168 lb (76.2 kg)    General appearance: alert, cooperative and appears stated age Ears: normal TM's and external ear canals both ears Throat: lips, mucosa, and tongue normal; teeth and gums normal Neck: no adenopathy, no carotid bruit, supple, symmetrical, trachea midline and thyroid not enlarged, symmetric, no tenderness/mass/nodules Back: symmetric, no curvature. ROM normal. No CVA tenderness. Lungs: clear to auscultation bilaterally Heart: regular rate and rhythm, S1, S2 normal, no murmur, click, rub or gallop Abdomen: soft, non-tender; bowel sounds normal; no masses,  no organomegaly Pulses: 2+ and symmetric Skin: Skin color, texture, turgor normal. No rashes or lesions Lymph nodes: Cervical, supraclavicular, and axillary nodes normal.  Lab Results  Component Value Date   HGBA1C 7.2 (H) 11/07/2017   HGBA1C 6.8 (H) 07/09/2017   HGBA1C 7.5 (H) 04/07/2017    Lab Results  Component Value Date   CREATININE 2.06 (H) 01/07/2018   CREATININE 1.94 (H) 11/10/2017   CREATININE 1.91 (H) 11/09/2017    Lab Results  Component Value Date   WBC 7.2 11/10/2017   HGB 10.0 (L) 11/10/2017   HCT 29.6 (L) 11/10/2017   PLT 291 11/10/2017   GLUCOSE 190 (H) 01/07/2018   CHOL 102 04/07/2017   TRIG  119.0 04/07/2017   HDL 34.00 (L) 04/07/2017   LDLDIRECT 49.0 06/26/2015   LDLCALC 44 04/07/2017   ALT 13 11/07/2017   AST 16 11/07/2017   NA 142 01/07/2018   K 4.8 01/07/2018   CL 110 01/07/2018   CREATININE 2.06 (H) 01/07/2018   BUN 51 (H) 01/07/2018   CO2 23 01/07/2018   TSH 1.49 06/13/2014   INR 1.11 11/07/2017   HGBA1C 7.2 (H) 11/07/2017   MICROALBUR 3.1 (H) 04/07/2017     Assessment & Plan:   Problem List Items Addressed This Visit    Aortic stenosis    Moderate,  By June 2019 ECHO.  EF 35% ([place])      Relevant Medications   torsemide (DEMADEX) 20 MG tablet   Chronic lower extremity pain (Primary Area of Pain) (Bilateral) (L>R) (Chronic)    Uncontrolled despite recnet epidural nerve block attempt by Naviera.  Topical compounded cream containing NSAID, lidocaine and DMSO infective.  Tramadol,  Vicodin and Percocet were either ineffective or not tolerated due to hallucinations with continued use. Elavil, Gabapentin and Lyrical also tried  Today I am adding baclofen for muscle spasms,  And low dose transdermal fentanyl, since it appears that he tolerated IV fentanyl during recent hospitalization without delirium or hallucinations.       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 Patient is up-to-date on eye exams and foot exam was not done due to surgical dressings and ongoing weekly wound management by vascular and Wound Care.   Patient  Has had  urine microalbumin to creatinine ratio done within the past year. Patient is tolerating statin therapy for CAD risk reduction and on ACE/ARB for reduction in proteinuria.   Lab Results  Component Value Date   HGBA1C 7.2 (H) 11/07/2017  Lab Results   Component Value Date   MICROALBUR 3.1 (H) 04/07/2017          Other Visit Diagnoses    Muscle spasm       Relevant Orders   Magnesium (Completed)   Basic metabolic panel (Completed)    A total of 40 minutes was spent with patient more than half of which was spent in counseling patient on the above mentioned issues , reviewing and explaining recent labs and imaging studies done, and coordination of care.   I have discontinued [name]. Cokley's hydrocortisone and amitriptyline. I am also having him start on torsemide, baclofen, and fentaNYL. Additionally, I am having him maintain his aspirin, Calcium Carbonate-Vitamin D, latanoprost, Syringe (Disposable), Insulin Syringe-Needle U-100, SYRINGE-NEEDLE (DISP) 3 ML, Insulin Pen Needle, furosemide, amLODipine, glucose blood, multivitamin, collagenase, cyanocobalamin, atorvastatin, clopidogrel, fenofibrate, losartan, acetaminophen, polyethylene glycol, protein supplement shake, insulin aspart protamine - aspart, silver sulfADIAZINE, and metoprolol tartrate.  Meds ordered this encounter  Medications  . torsemide (DEMADEX) 20 MG tablet    Sig: Take 1 tablet (20 mg total) by mouth daily.    Dispense:  30 tablet    Refill:  1  . baclofen (LIORESAL) 10 MG tablet    Sig: Take 1 tablet (10 mg total) by mouth 3 (three) times daily.    Dispense:  90 each    Refill:  0  . fentaNYL (DURAGESIC - DOSED MCG/HR) 25 MCG/HR patch    Sig: Place 1 patch (25 mcg total) onto the skin every 3 (three) days for 10 doses.    Dispense:  10 patch    Refill:  0    Medications Discontinued During This Encounter  Medication Reason  . amitriptyline (ELAVIL) 25 MG tablet Patient has not taken in last 30 days  . hydrocortisone 2.5 % cream Patient has not taken in last 30 days    Follow-up: Return in about 2 weeks (around 01/21/2018).   Crecencio Mc, MD

## 2018-01-07 NOTE — Patient Instructions (Signed)
I am prescribing baclofen for the muscle spasms.  You can take it every 8 hours  I am prescribing fentanyl in a patch that you wear for 3 days before replacing it with another one  You received this opioid while you were in the hospital through your IV   I am changing your fluid pill to torsemide.  Stop the furosemide while you are taking it .  Once daily in the morning

## 2018-01-08 ENCOUNTER — Encounter: Payer: Medicare Other | Attending: Physician Assistant | Admitting: Physician Assistant

## 2018-01-08 ENCOUNTER — Telehealth: Payer: Self-pay

## 2018-01-08 DIAGNOSIS — Z888 Allergy status to other drugs, medicaments and biological substances status: Secondary | ICD-10-CM | POA: Insufficient documentation

## 2018-01-08 DIAGNOSIS — I2510 Atherosclerotic heart disease of native coronary artery without angina pectoris: Secondary | ICD-10-CM | POA: Diagnosis not present

## 2018-01-08 DIAGNOSIS — L97322 Non-pressure chronic ulcer of left ankle with fat layer exposed: Secondary | ICD-10-CM | POA: Diagnosis not present

## 2018-01-08 DIAGNOSIS — Z87891 Personal history of nicotine dependence: Secondary | ICD-10-CM | POA: Diagnosis not present

## 2018-01-08 DIAGNOSIS — L97512 Non-pressure chronic ulcer of other part of right foot with fat layer exposed: Secondary | ICD-10-CM | POA: Diagnosis not present

## 2018-01-08 DIAGNOSIS — I132 Hypertensive heart and chronic kidney disease with heart failure and with stage 5 chronic kidney disease, or end stage renal disease: Secondary | ICD-10-CM | POA: Diagnosis not present

## 2018-01-08 DIAGNOSIS — E11621 Type 2 diabetes mellitus with foot ulcer: Secondary | ICD-10-CM | POA: Diagnosis not present

## 2018-01-08 DIAGNOSIS — L97529 Non-pressure chronic ulcer of other part of left foot with unspecified severity: Secondary | ICD-10-CM | POA: Diagnosis not present

## 2018-01-08 DIAGNOSIS — L97519 Non-pressure chronic ulcer of other part of right foot with unspecified severity: Secondary | ICD-10-CM | POA: Diagnosis not present

## 2018-01-08 DIAGNOSIS — E1151 Type 2 diabetes mellitus with diabetic peripheral angiopathy without gangrene: Secondary | ICD-10-CM | POA: Insufficient documentation

## 2018-01-08 DIAGNOSIS — M069 Rheumatoid arthritis, unspecified: Secondary | ICD-10-CM | POA: Insufficient documentation

## 2018-01-08 DIAGNOSIS — N186 End stage renal disease: Secondary | ICD-10-CM | POA: Insufficient documentation

## 2018-01-08 DIAGNOSIS — E1122 Type 2 diabetes mellitus with diabetic chronic kidney disease: Secondary | ICD-10-CM | POA: Insufficient documentation

## 2018-01-08 DIAGNOSIS — I5022 Chronic systolic (congestive) heart failure: Secondary | ICD-10-CM | POA: Diagnosis not present

## 2018-01-08 DIAGNOSIS — T8189XA Other complications of procedures, not elsewhere classified, initial encounter: Secondary | ICD-10-CM | POA: Diagnosis not present

## 2018-01-08 DIAGNOSIS — L97319 Non-pressure chronic ulcer of right ankle with unspecified severity: Secondary | ICD-10-CM | POA: Diagnosis not present

## 2018-01-08 DIAGNOSIS — Z885 Allergy status to narcotic agent status: Secondary | ICD-10-CM | POA: Insufficient documentation

## 2018-01-08 DIAGNOSIS — L97526 Non-pressure chronic ulcer of other part of left foot with bone involvement without evidence of necrosis: Secondary | ICD-10-CM | POA: Diagnosis not present

## 2018-01-08 DIAGNOSIS — E1140 Type 2 diabetes mellitus with diabetic neuropathy, unspecified: Secondary | ICD-10-CM | POA: Diagnosis not present

## 2018-01-08 NOTE — Assessment & Plan Note (Signed)
Slight loss of control on current insulin regimen  . Patient is up-to-date on eye exams and foot exam was not done due to surgical dressings and ongoing weekly wound management by vascular and Wound Care.   Patient  Has had  urine microalbumin to creatinine ratio done within the past year. Patient is tolerating statin therapy for CAD risk reduction and on ACE/ARB for reduction in proteinuria.   Lab Results  Component Value Date   HGBA1C 7.2 (H) 11/07/2017   Lab Results  Component Value Date   MICROALBUR 3.1 (H) 04/07/2017

## 2018-01-08 NOTE — Assessment & Plan Note (Signed)
Changing furosemide 40 mg to torsemide 20 mg as a trial

## 2018-01-08 NOTE — Telephone Encounter (Signed)
Copied from [place] [id number]. Topic: General - Other >> Jan 08, 2018 12:03 PM Keene Breath wrote: Reason for CRM: Patient called to inform the doctor that the pharmacy said that the patient would need a PA for the medication fentaNYL (DURAGESIC - DOSED MCG/HR) 25 MCG/HR patch, before it could be filled.  Please advise and call patient once this has been done.  CB# [phone]

## 2018-01-08 NOTE — Assessment & Plan Note (Signed)
Moderate,  By June 2019 ECHO.  EF 35% ([place])

## 2018-01-08 NOTE — Assessment & Plan Note (Signed)
Uncontrolled despite recnet epidural nerve block attempt by Naviera.  Topical compounded cream containing NSAID, lidocaine and DMSO infective.  Tramadol,  Vicodin and Percocet were either ineffective or not tolerated due to hallucinations with continued use. Elavil, Gabapentin and Lyrical also tried  Today I am adding baclofen for muscle spasms,  And low dose transdermal fentanyl, since it appears that he tolerated IV fentanyl during recent hospitalization without delirium or hallucinations.

## 2018-01-10 DIAGNOSIS — I70203 Unspecified atherosclerosis of native arteries of extremities, bilateral legs: Secondary | ICD-10-CM | POA: Diagnosis not present

## 2018-01-10 DIAGNOSIS — E1152 Type 2 diabetes mellitus with diabetic peripheral angiopathy with gangrene: Secondary | ICD-10-CM | POA: Diagnosis not present

## 2018-01-10 DIAGNOSIS — E11621 Type 2 diabetes mellitus with foot ulcer: Secondary | ICD-10-CM | POA: Diagnosis not present

## 2018-01-10 DIAGNOSIS — I10 Essential (primary) hypertension: Secondary | ICD-10-CM | POA: Diagnosis not present

## 2018-01-10 DIAGNOSIS — L97513 Non-pressure chronic ulcer of other part of right foot with necrosis of muscle: Secondary | ICD-10-CM | POA: Diagnosis not present

## 2018-01-10 DIAGNOSIS — Z794 Long term (current) use of insulin: Secondary | ICD-10-CM | POA: Diagnosis not present

## 2018-01-11 NOTE — Progress Notes (Signed)
Patient's Name: [name]  MRN: [id number]  Referring Provider: Crecencio Mc, MD  DOB: [birth year]  PCP: Crecencio Mc, MD  DOS: 01/12/2018  Note by: Gaspar Cola, MD  Service setting: Ambulatory outpatient  Specialty: Interventional Pain Management  Location: [facility] (AMB) Pain Management Facility    Patient type: Established   Primary Reason(s) for Visit: Encounter for post-procedure evaluation of chronic illness with mild to moderate exacerbation CC: Follow-up  HPI  Mr. Grahn is a 84 y.o. year old, male patient, who comes today for a post-procedure evaluation. 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 ([place]); Diabetes mellitus with peripheral vascular disease ([place]); Skin lesion of scalp; Insomnia; Overweight (BMI 25.0-29.9); Do not resuscitate discussion; Edema; CKD (chronic kidney disease) stage 3, GFR 30-59 ml/min ([place]); Grief reaction; Diabetes mellitus with chronic kidney disease ([place]); Pain in thumb joint with movement of right hand; Aortic stenosis; Atherosclerosis of native arteries of the extremities with ulceration ([place]); Chronic lower extremity pain (Primary Area of Pain) (Bilateral) (L>R); Hospital discharge follow-up; Diabetic peripheral neuropathy ([place]); 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 His primarily concern today  is the Follow-up  Pain Assessment: Location: Right, Left(Left leg is worse) Foot Radiating: Denies  Onset: More than a month ago Duration: Chronic pain Quality: Burning("Stinging" ) Severity: 1 /10 (subjective, self-reported pain score)  Note: Reported level is compatible with observation.                         When using our objective Pain Scale, levels between 6 and 10/10 are said to belong in an emergency room, as it progressively worsens from a 6/10, described as severely limiting, requiring emergency care not usually available at an outpatient pain management facility. At a 6/10 level, communication becomes difficult and requires great effort. Assistance to reach the emergency department may be required. Facial flushing and profuse sweating along with potentially dangerous increases in heart rate and blood pressure will be evident. Effect on ADL: [place] activities  Timing: Intermittent Modifying factors: Baclofen  BP: (!) 150/57  HR: 71  Mr. Gaba comes in today for post-procedure evaluation. Edema of the lower extremities has improved since block. Pain continues   Further details on both, my assessment(s), as well as the proposed treatment plan, please see below.  Post-Procedure Assessment  12/25/2017 Procedure: Diagnostic left lumbar sympathetic block #1 under fluoroscopic guidance and IV sedation.  Pre-procedure pain score:  0/10 Post-procedure pain score: 0/10 (100% relief) Influential Factors: BMI: 26.15 kg/m Intra-procedural challenges: None observed.         Assessment challenges: Unreliable results due to inadequate record keeping by Mr. Carranza.       Reported side-effects: None.        Post-procedural adverse reactions or complications: None reported         Sedation: Sedation provided. When no sedatives are used, the analgesic levels obtained are directly associated to the effectiveness of the local anesthetics.  However, when sedation is provided, the level of  analgesia obtained during the initial 1 hour following the intervention, is believed to be the result of a combination of factors. These factors may include, but are not limited to: 1. The effectiveness of the local anesthetics used. 2. The effects of the analgesic(s) and/or anxiolytic(s) used. 3. The degree of discomfort experienced by the patient at the time of the procedure. 4. The patients ability and reliability in recalling and recording the events. 5. The presence and influence of possible secondary gains and/or psychosocial factors. Reported result: Relief experienced during the 1st hour after the procedure: 100 % (Ultra-Short Term Relief) Mr. Cavazos has indicated area to have been numb during this time. Interpretative annotation: Clinically appropriate result. Analgesia during this period is likely to be Local Anesthetic and/or IV Sedative (Analgesic/Anxiolytic) related.          Effects of local anesthetic: The analgesic effects attained during this period are directly associated to the localized infiltration of local anesthetics and therefore cary significant diagnostic value as to the etiological location, or anatomical origin, of the pain. Expected duration of relief is directly dependent on the pharmacodynamics of the local anesthetic used. Long-acting (4-6 hours) anesthetics used.  Reported result: Relief during the next 4 to 6 hour after the procedure: 0 % (Short-Term Relief) Further questioning reveals that the patient went back to his home and he probably fell asleep.  He is a very poor historian and it was very difficult for me to get him to tell me if he did or did not go to sleep.  He would simply answer "may be".  Unfortunately, I cannot base a medical decision on a "may be". Interpretative annotation: Unreliable result. Poor/inaccurate patient record keeping and/or recollection.          Long-term benefit: Defined as the period of time past the expected duration of local  anesthetics (1 hour for short-acting and 4-6 hours for long-acting). With the possible exception of prolonged sympathetic blockade from the local anesthetics, benefits during this period are typically attributed to, or associated with, other factors such as analgesic sensory neuropraxia, antiinflammatory effects, or beneficial biochemical changes provided by agents other than the local anesthetics.  Reported result: Extended relief following procedure: 0 %(Can't tell that it made any difference" ) (Long-Term Relief)            Interpretative annotation: Unreliable result. Poor/inaccurate patient record keeping and/or recollection.                Current benefits: Defined as reported results that persistent at this point in time.   Analgesia: 0 %            Function: Back to baseline ROM: Back to baseline Interpretative annotation: No benefit. Therapeutic failure. Results would suggest persistent aggravating factors.          Interpretation: Results would suggest failure of therapy in achieving desired goal(s). Unfortunately, there were too many variables introduced around the plus procedural time.  The patient was started on baclofen, which he believes helped the pain, but the same time introduced a different variable.  The patient seems to think that the baclofen gave him the relief of the pain that he is currently experiencing and he also believes that the reason why his swelling went down is because he has been able to keep his legs elevated.  Although this is true, both of those could be benefit that we see with the lumbar sympathetic blocks.  Unfortunately, because there were additional variables introduced, the only way to determine if that was the case is to give it more time.  If by any chance his swelling returns despite the fact that his legs have been elevated and he has pain recurrence despite the fact that he continues on the baclofen, then we will have to did talk that those benefits came  from the lumbar sympathetic block.          Plan:  Please see "Plan of Care" for details.                Laboratory Chemistry  Inflammation Markers (CRP: Acute Phase) (ESR: Chronic Phase) Lab Results  Component Value Date   ESRSEDRATE 56 (H) 11/07/2017                         Rheumatology Markers No results found.  Renal Markers Lab Results  Component Value Date   BUN 51 (H) 01/07/2018   CREATININE 2.06 (H) 01/07/2018   GFRAA 33 (L) 11/10/2017   GFRNONAA 29 (L) 11/10/2017                             Hepatic Markers Lab Results  Component Value Date   AST 16 11/07/2017   ALT 13 11/07/2017   ALBUMIN 3.5 11/07/2017                        Neuropathy Markers Lab Results  Component Value Date   VITAMINB12 206 (L) 06/26/2015   HGBA1C 7.2 (H) 11/07/2017                        Hematology Parameters Lab Results  Component Value Date   INR 1.11 11/07/2017   LABPROT 14.2 11/07/2017   APTT 32 11/07/2017   PLT 291 11/10/2017   HGB 10.0 (L) 11/10/2017   HCT 29.6 (L) 11/10/2017                        CV Markers Lab Results  Component Value Date   CKTOTAL 30 (L) 11/07/2017   TROPONINI 0.03 ([place]) 10/03/2017                         Note: Lab results reviewed.  Recent Imaging Results   Results for orders placed in visit on 12/25/17  DG C-Arm 1-60 Min-No Report   Narrative Fluoroscopy was utilized by the requesting physician.  No radiographic  interpretation.    Interpretation Report: Fluoroscopy was used during the procedure to assist with needle guidance. The images were interpreted intraoperatively by the requesting physician.  Meds   Current Outpatient Medications:  .  acetaminophen (TYLENOL) 325 MG tablet, Take 2 tablets (650 mg total) by mouth every 6 (six) hours as needed for mild pain (or Fever >/= 101)., Disp: , Rfl:  .  amLODipine (NORVASC) 10 MG tablet, TAKE 1 TABLET (10 MG TOTAL) BY MOUTH DAILY. (Patient taking differently: Take 10 mg by mouth every  morning. ), Disp: 90 tablet, Rfl: 2 .  aspirin 325 MG tablet, Take 325 mg by mouth every evening. , Disp: , Rfl:  .  atorvastatin (LIPITOR) 20 MG tablet, TAKE 1 TABLET (20 MG TOTAL) BY MOUTH DAILY. (Patient taking differently: Take 20 mg by mouth daily at 6 PM. ), Disp: 90 tablet,  Rfl: 1 .  baclofen (LIORESAL) 10 MG tablet, Take 1 tablet (10 mg total) by mouth 3 (three) times daily., Disp: 90 each, Rfl: 0 .  Calcium Carbonate-Vitamin D (CALCIUM + D) 600-200 MG-UNIT TABS, Take 1 tablet by mouth 2 (two) times daily.  , Disp: , Rfl:  .  clopidogrel (PLAVIX) 75 MG tablet, TAKE 1 TABLET (75 MG TOTAL) BY MOUTH DAILY., Disp: 90 tablet, Rfl: 1 .  collagenase (SANTYL) ointment, Apply to wound twice a day (Patient taking differently: Apply 1 application topically daily. Apply to wound twice a day), Disp: 30 g, Rfl: 1 .  cyanocobalamin (,VITAMIN B-12,) 1000 MCG/ML injection, INJECT 1 ML (1,000 MCG TOTAL) INTO THE MUSCLE EVERY 30 (THIRTY) DAYS., Disp: 3 mL, Rfl: 3 .  fenofibrate (TRICOR) 48 MG tablet, TAKE 1 TABLET BY MOUTH DAILY (Patient taking differently: Take 48 mg by mouth every morning. ), Disp: 90 tablet, Rfl: 1 .  glucose blood (ACCU-CHEK AVIVA PLUS) test strip, CHECK BLOOD SUGAR 3 TIMES A DAY AS NEEDED DX E11.51, Disp: 300 each, Rfl: 5 .  insulin aspart protamine - aspart (NOVOLOG MIX 70/30 FLEXPEN) (70-30) 100 UNIT/ML FlexPen, INJECT 18 units two times daily , adjust dose using sliding scale, Disp: 75 pen, Rfl: 1 .  Insulin Pen Needle (B-D ULTRAFINE III SHORT PEN) 31G X 8 MM MISC, USE AS DIRECTED 3 TIMES A DAY, Disp: 300 each, Rfl: 5 .  Insulin Syringe-Needle U-100 (B-D INS SYRINGE 0.5CC/31GX5/16) 31G X 5/16" 0.5 ML MISC, 1 application by Does not apply route 3 (three) times daily before meals., Disp: 300 each, Rfl: 3 .  latanoprost (XALATAN) 0.005 % ophthalmic solution, Place 1 drop into both eyes at bedtime., Disp: , Rfl: 3 .  losartan (COZAAR) 100 MG tablet, TAKE 1 TABLET BY MOUTH EVERY DAY (Patient  taking differently: Take 100 mg by mouth every morning. ), Disp: 90 tablet, Rfl: 1 .  metoprolol tartrate (LOPRESSOR) 100 MG tablet, TAKE 1 TABLET BY MOUTH TWICE A DAY, Disp: 180 tablet, Rfl: 1 .  Multiple Vitamin (MULTIVITAMIN) tablet, Take 1 tablet by mouth every evening. , Disp: , Rfl:  .  polyethylene glycol (MIRALAX / GLYCOLAX) packet, Take 17 g by mouth daily as needed for mild constipation., Disp: 14 each, Rfl: 0 .  protein supplement shake (PREMIER PROTEIN) LIQD, Take 325 mLs (11 oz total) by mouth 2 (two) times daily between meals., Disp: 60 Can, Rfl: 0 .  silver sulfADIAZINE (SILVADENE) 1 % cream, Apply 1 application topically daily. Apply amputation site and small toe daily, Disp: 50 g, Rfl: 0 .  Syringe, Disposable, 3 ML MISC, For use with B12 injections weekly/monthly, Disp: 25 each, Rfl: 0 .  SYRINGE-NEEDLE, DISP, 3 ML (B-D 3CC LUER-LOK SYR 25GX1") 25G X 1" 3 ML MISC, FOR USE WITH B12 INJECTIONS WEEKLY/MONTHLY, Disp: 50 each, Rfl: 1 .  torsemide (DEMADEX) 20 MG tablet, Take 1 tablet (20 mg total) by mouth daily., Disp: 30 tablet, Rfl: 1 .  furosemide (LASIX) 20 MG tablet, Take 2 tablets (40 mg total) by mouth daily. AS NEEDED FOR FLUID RETENTION (Patient not taking: Reported on 01/12/2018), Disp: 180 tablet, Rfl: 1  ROS  Constitutional: Denies any fever or chills Gastrointestinal: No reported hemesis, hematochezia, vomiting, or acute GI distress Musculoskeletal: Denies any acute onset joint swelling, redness, loss of ROM, or weakness Neurological: No reported episodes of acute onset apraxia, aphasia, dysarthria, agnosia, amnesia, paralysis, loss of coordination, or loss of consciousness  Allergies  Mr. Wilms is allergic to  hydrocodone-acetaminophen; lyrica [pregabalin]; tramadol; and trazodone and nefazodone.  [place]  Drug: Mr. Sweetland  reports that he does not use drugs. Alcohol:  reports that he does not drink alcohol. Tobacco:  reports that he quit smoking about 32 years ago.  He has never used smokeless tobacco. Medical:  has a past medical history of Arthritis, Blood transfusion, Cancer ([place]), Coronary artery disease, Diabetes mellitus, H/O: malaria (1941), Hyperlipidemia, Hypertension, Peripheral artery disease ([place]), and Renal insufficiency. Surgical: Mr. Savage  has a past surgical history that includes Spine surgery; Coronary artery bypass graft (2001); Pacemaker insertion; Femoral bypass (2011); Insert / replace / remove pacemaker; Lumbar laminectomy/decompression microdiscectomy (01/16/2011); Lower Extremity Angiography (Left, 08/18/2017); Lower Extremity Angiography (Left, 10/20/2017); Lower Extremity Angiography (Right, 10/27/2017); Amputation (Left, 10/29/2017); and Lower Extremity Angiography (Left, 11/10/2017). Family: family history includes Diabetes in his father; Mental illness (age of onset: 68) in his mother; Peripheral vascular disease in his father.  Constitutional Exam  General appearance: Well nourished, well developed, and well hydrated. In no apparent acute distress Vitals:   01/12/18 1312  BP: (!) 150/57  Pulse: 71  Temp: 98 F (36.7 C)  SpO2: 99%  Weight: 172 lb (78 kg)  Height: '5\' 8"'  (1.727 m)   BMI Assessment: Estimated body mass index is 26.15 kg/m as calculated from the following:   Height as of this encounter: '5\' 8"'  (1.727 m).   Weight as of this encounter: 172 lb (78 kg).  BMI interpretation table: BMI level Category Range association with higher incidence of chronic pain  <18 kg/m2 Underweight   18.5-24.9 kg/m2 Ideal body weight   25-29.9 kg/m2 Overweight Increased incidence by 20%  30-34.9 kg/m2 Obese (Class I) Increased incidence by 68%  35-39.9 kg/m2 Severe obesity (Class II) Increased incidence by 136%  >40 kg/m2 Extreme obesity (Class III) Increased incidence by 254%   Patient's current BMI Ideal Body weight  Body mass index is 26.15 kg/m. Ideal body weight: 68.4 kg (150 lb 12.7 oz) Adjusted ideal body weight: 72.2 kg  (159 lb 4.4 oz)   BMI Readings from Last 4 Encounters:  01/12/18 26.15 kg/m  01/07/18 27.52 kg/m  12/25/17 25.24 kg/m  12/15/17 25.54 kg/m   Wt Readings from Last 4 Encounters:  01/12/18 172 lb (78 kg)  01/07/18 181 lb (82.1 kg)  12/25/17 166 lb (75.3 kg)  12/15/17 168 lb (76.2 kg)  Psych/Mental status: Alert, oriented x 3 (person, place, & time)       Eyes: PERLA Respiratory: No evidence of acute respiratory distress  Cervical Spine Area Exam  Skin & Axial Inspection: No masses, redness, edema, swelling, or associated skin lesions Alignment: Symmetrical Functional ROM: Unrestricted ROM      Stability: No instability detected Muscle Tone/Strength: Functionally intact. No obvious neuro-muscular anomalies detected. Sensory (Neurological): Unimpaired Palpation: No palpable anomalies              Upper Extremity (UE) Exam    Side: Right upper extremity  Side: Left upper extremity  Skin & Extremity Inspection: Skin color, temperature, and hair growth are WNL. No peripheral edema or cyanosis. No masses, redness, swelling, asymmetry, or associated skin lesions. No contractures.  Skin & Extremity Inspection: Skin color, temperature, and hair growth are WNL. No peripheral edema or cyanosis. No masses, redness, swelling, asymmetry, or associated skin lesions. No contractures.  Functional ROM: Unrestricted ROM          Functional ROM: Unrestricted ROM          Muscle Tone/Strength: Functionally intact.  No obvious neuro-muscular anomalies detected.  Muscle Tone/Strength: Functionally intact. No obvious neuro-muscular anomalies detected.  Sensory (Neurological): Unimpaired          Sensory (Neurological): Unimpaired          Palpation: No palpable anomalies              Palpation: No palpable anomalies              Provocative Test(s):  Phalen's test: deferred Tinel's test: deferred Apley's scratch test (touch opposite shoulder):  Action 1 (Across chest): deferred Action 2 (Overhead):  deferred Action 3 (LB reach): deferred   Provocative Test(s):  Phalen's test: deferred Tinel's test: deferred Apley's scratch test (touch opposite shoulder):  Action 1 (Across chest): deferred Action 2 (Overhead): deferred Action 3 (LB reach): deferred    Thoracic Spine Area Exam  Skin & Axial Inspection: No masses, redness, or swelling Alignment: Symmetrical Functional ROM: Unrestricted ROM Stability: No instability detected Muscle Tone/Strength: Functionally intact. No obvious neuro-muscular anomalies detected. Sensory (Neurological): Unimpaired Muscle strength & Tone: No palpable anomalies  Lumbar Spine Area Exam  Skin & Axial Inspection: No masses, redness, or swelling Alignment: Symmetrical Functional ROM: Unrestricted ROM       Stability: No instability detected Muscle Tone/Strength: Functionally intact. No obvious neuro-muscular anomalies detected. Sensory (Neurological): Unimpaired Palpation: No palpable anomalies       Provocative Tests: Hyperextension/rotation test: deferred today       Lumbar quadrant test (Kemp's test): deferred today       Lateral bending test: deferred today       Patrick's Maneuver: deferred today                   FABER* test: deferred today                   S-I anterior distraction/compression test: deferred today         S-I lateral compression test: deferred today         S-I Thigh-thrust test: deferred today         S-I Gaenslen's test: deferred today         *(Flexion, ABduction and External Rotation)  Gait & Posture Assessment  Ambulation: Patient came in today in a wheel chair Gait: Significantly limited. Dependent on assistive device to ambulate Posture: Antalgic   Lower Extremity Exam    Side: Right lower extremity  Side: Left lower extremity  Stability: No instability observed          Stability: No instability observed          Skin & Extremity Inspection: The patient's lower extremity swelling seems to have gone down  compared to his initial evaluation.  Skin & Extremity Inspection: The patient's lower extremity swelling seems to have gone down compared to his initial evaluation.  Functional ROM: Decreased ROM for all joints of the lower extremity          Functional ROM: Decreased ROM for all joints of the lower extremity          Muscle Tone/Strength: Functionally intact. No obvious neuro-muscular anomalies detected.  Muscle Tone/Strength: Functionally intact. No obvious neuro-muscular anomalies detected.  Sensory (Neurological): Unimpaired        Sensory (Neurological): Unimpaired        DTR: Patellar: deferred today Achilles: deferred today Plantar: deferred today  DTR: Patellar: deferred today Achilles: deferred today Plantar: deferred today  Palpation: No palpable anomalies  Palpation: No palpable anomalies   Assessment  Primary Diagnosis & Pertinent Problem List: The primary encounter diagnosis was Chronic lower extremity pain (Primary Area of Pain) (Bilateral) (L>R). Diagnoses of Diabetic peripheral neuropathy ([place]), Peripheral vascular disease due to secondary diabetes [facility]), Spinal stenosis of lumbar region with neurogenic claudication, Long term current use of anticoagulant therapy (PLAVIX), and Other intervertebral disc degeneration, lumbar region were also pertinent to this visit.  Status Diagnosis  Persistent Persistent Persistent 1. Chronic lower extremity pain (Primary Area of Pain) (Bilateral) (L>R)   2. Diabetic peripheral neuropathy ([place])   3. Peripheral vascular disease due to secondary diabetes ([place])   4. Spinal stenosis of lumbar region with neurogenic claudication   5. Long term current use of anticoagulant therapy (PLAVIX)   6. Other intervertebral disc degeneration, lumbar region     Problems updated and reviewed during this visit: Problem  Long term current use of anticoagulant therapy (PLAVIX)   Stop: 7-10 days prior to blocks Restart: 2 hours after block    Plan of  Care  Pharmacotherapy (Medications Ordered): No orders of the defined types were placed in this encounter.  Medications administered today: Yevonne Aline had no medications administered during this visit.   Procedure Orders     LUMBAR SYMPATHETIC BLOCK Lab Orders  No laboratory test(s) ordered today    Imaging Orders     CT LUMBAR SPINE WO CONTRAST Referral Orders  No referral(s) requested today   Interventional management options: Planned, scheduled, and/or pending:   No further interventional therapies at this point.  The patient seems to have benefited from the baclofen.  Continue the baclofen as is.  At the time of my evaluation the patient had not taking any of the fentanyl and therefore I would recommend that it not be started.  Should the patient's pain return despite the continued use of the baclofen and the lower extremity swelling also return despite the fact that he has kept his legs up, then we have to assume that those 2 benefits were from the lumbar sympathetic block and therefore it would be warranted to try it again.  This time without changing medications around the time of the diagnostic injection. In addition to the above, the patient has a history of lumbar spinal stenosis that has not been reviewed by any recent imaging and therefore today I will be ordering a CT scan of the lumbar spine to see if this is also contributing towards his bilateral lower extremity pain.   Considering:   Diagnostic left lumbar sympathetic block    Palliative PRN treatment(s):   None at this time   Provider-requested follow-up: Return for PRN Procedure: (R) LSB #1, F/U eval (after test completion).  Future Appointments  Date Time Provider [place]  01/15/2018 10:15 AM Jeri Cos Annabella III, PA-C [facility] None  01/21/2018 11:00 AM Crecencio Mc, MD [facility]  01/22/2018 10:15 AM Woodroe Chen III, PA-C [facility] None  02/03/2018 10:30 AM [facility] None   02/03/2018 11:00 AM [facility] None  02/03/2018 11:45 AM Dew, Erskine Squibb, MD [facility] None  03/19/2018  2:00 PM O'Brien-Blaney, Bryson Corona, LPN [facility]   Primary Care Physician: Crecencio Mc, MD Location: [facility] Note by: Gaspar Cola, MD Date: 01/12/2018; Time: 3:22 PM

## 2018-01-12 ENCOUNTER — Ambulatory Visit: Payer: Medicare Other | Attending: Pain Medicine | Admitting: Pain Medicine

## 2018-01-12 ENCOUNTER — Other Ambulatory Visit: Payer: Self-pay

## 2018-01-12 ENCOUNTER — Encounter: Payer: Self-pay | Admitting: Pain Medicine

## 2018-01-12 VITALS — BP 150/57 | HR 71 | Temp 98.0°F | Ht 68.0 in | Wt 172.0 lb

## 2018-01-12 DIAGNOSIS — Z885 Allergy status to narcotic agent status: Secondary | ICD-10-CM | POA: Diagnosis not present

## 2018-01-12 DIAGNOSIS — Z7901 Long term (current) use of anticoagulants: Secondary | ICD-10-CM | POA: Diagnosis not present

## 2018-01-12 DIAGNOSIS — Z8249 Family history of ischemic heart disease and other diseases of the circulatory system: Secondary | ICD-10-CM | POA: Insufficient documentation

## 2018-01-12 DIAGNOSIS — G8929 Other chronic pain: Secondary | ICD-10-CM

## 2018-01-12 DIAGNOSIS — M79606 Pain in leg, unspecified: Secondary | ICD-10-CM | POA: Insufficient documentation

## 2018-01-12 DIAGNOSIS — M79604 Pain in right leg: Secondary | ICD-10-CM | POA: Diagnosis not present

## 2018-01-12 DIAGNOSIS — Z794 Long term (current) use of insulin: Secondary | ICD-10-CM | POA: Insufficient documentation

## 2018-01-12 DIAGNOSIS — E1122 Type 2 diabetes mellitus with diabetic chronic kidney disease: Secondary | ICD-10-CM | POA: Diagnosis not present

## 2018-01-12 DIAGNOSIS — E785 Hyperlipidemia, unspecified: Secondary | ICD-10-CM | POA: Diagnosis not present

## 2018-01-12 DIAGNOSIS — Z7982 Long term (current) use of aspirin: Secondary | ICD-10-CM | POA: Insufficient documentation

## 2018-01-12 DIAGNOSIS — M5136 Other intervertebral disc degeneration, lumbar region: Secondary | ICD-10-CM

## 2018-01-12 DIAGNOSIS — Z950 Presence of cardiac pacemaker: Secondary | ICD-10-CM | POA: Diagnosis not present

## 2018-01-12 DIAGNOSIS — E1142 Type 2 diabetes mellitus with diabetic polyneuropathy: Secondary | ICD-10-CM | POA: Diagnosis not present

## 2018-01-12 DIAGNOSIS — I129 Hypertensive chronic kidney disease with stage 1 through stage 4 chronic kidney disease, or unspecified chronic kidney disease: Secondary | ICD-10-CM | POA: Diagnosis not present

## 2018-01-12 DIAGNOSIS — Z833 Family history of diabetes mellitus: Secondary | ICD-10-CM | POA: Insufficient documentation

## 2018-01-12 DIAGNOSIS — E1351 Other specified diabetes mellitus with diabetic peripheral angiopathy without gangrene: Secondary | ICD-10-CM | POA: Diagnosis not present

## 2018-01-12 DIAGNOSIS — G4700 Insomnia, unspecified: Secondary | ICD-10-CM | POA: Diagnosis not present

## 2018-01-12 DIAGNOSIS — E11621 Type 2 diabetes mellitus with foot ulcer: Secondary | ICD-10-CM | POA: Diagnosis not present

## 2018-01-12 DIAGNOSIS — I10 Essential (primary) hypertension: Secondary | ICD-10-CM | POA: Diagnosis not present

## 2018-01-12 DIAGNOSIS — M79641 Pain in right hand: Secondary | ICD-10-CM | POA: Insufficient documentation

## 2018-01-12 DIAGNOSIS — N183 Chronic kidney disease, stage 3 (moderate): Secondary | ICD-10-CM | POA: Diagnosis not present

## 2018-01-12 DIAGNOSIS — I7025 Atherosclerosis of native arteries of other extremities with ulceration: Secondary | ICD-10-CM

## 2018-01-12 DIAGNOSIS — Z7902 Long term (current) use of antithrombotics/antiplatelets: Secondary | ICD-10-CM | POA: Insufficient documentation

## 2018-01-12 DIAGNOSIS — Z79899 Other long term (current) drug therapy: Secondary | ICD-10-CM | POA: Insufficient documentation

## 2018-01-12 DIAGNOSIS — G894 Chronic pain syndrome: Secondary | ICD-10-CM | POA: Diagnosis not present

## 2018-01-12 DIAGNOSIS — Z9889 Other specified postprocedural states: Secondary | ICD-10-CM | POA: Insufficient documentation

## 2018-01-12 DIAGNOSIS — E1152 Type 2 diabetes mellitus with diabetic peripheral angiopathy with gangrene: Secondary | ICD-10-CM | POA: Diagnosis not present

## 2018-01-12 DIAGNOSIS — E1151 Type 2 diabetes mellitus with diabetic peripheral angiopathy without gangrene: Secondary | ICD-10-CM | POA: Diagnosis not present

## 2018-01-12 DIAGNOSIS — M79605 Pain in left leg: Secondary | ICD-10-CM | POA: Diagnosis not present

## 2018-01-12 DIAGNOSIS — L97513 Non-pressure chronic ulcer of other part of right foot with necrosis of muscle: Secondary | ICD-10-CM | POA: Diagnosis not present

## 2018-01-12 DIAGNOSIS — Z888 Allergy status to other drugs, medicaments and biological substances status: Secondary | ICD-10-CM | POA: Insufficient documentation

## 2018-01-12 DIAGNOSIS — Z951 Presence of aortocoronary bypass graft: Secondary | ICD-10-CM | POA: Insufficient documentation

## 2018-01-12 DIAGNOSIS — R600 Localized edema: Secondary | ICD-10-CM | POA: Insufficient documentation

## 2018-01-12 DIAGNOSIS — Z87891 Personal history of nicotine dependence: Secondary | ICD-10-CM | POA: Insufficient documentation

## 2018-01-12 DIAGNOSIS — I350 Nonrheumatic aortic (valve) stenosis: Secondary | ICD-10-CM | POA: Diagnosis not present

## 2018-01-12 DIAGNOSIS — I219 Acute myocardial infarction, unspecified: Secondary | ICD-10-CM | POA: Insufficient documentation

## 2018-01-12 DIAGNOSIS — I70203 Unspecified atherosclerosis of native arteries of extremities, bilateral legs: Secondary | ICD-10-CM | POA: Diagnosis not present

## 2018-01-12 DIAGNOSIS — M48062 Spinal stenosis, lumbar region with neurogenic claudication: Secondary | ICD-10-CM

## 2018-01-12 NOTE — Progress Notes (Signed)
Safety precautions to be maintained throughout the outpatient stay will include: orient to surroundings, keep bed in low position, maintain call bell within reach at all times, provide assistance with transfer out of bed and ambulation.  

## 2018-01-13 NOTE — Telephone Encounter (Signed)
PA for Fentanyl patch has been submitted on covermymeds.

## 2018-01-14 ENCOUNTER — Other Ambulatory Visit: Payer: Self-pay | Admitting: Internal Medicine

## 2018-01-14 DIAGNOSIS — M79605 Pain in left leg: Principal | ICD-10-CM

## 2018-01-14 DIAGNOSIS — E1152 Type 2 diabetes mellitus with diabetic peripheral angiopathy with gangrene: Secondary | ICD-10-CM | POA: Diagnosis not present

## 2018-01-14 DIAGNOSIS — L97513 Non-pressure chronic ulcer of other part of right foot with necrosis of muscle: Secondary | ICD-10-CM | POA: Diagnosis not present

## 2018-01-14 DIAGNOSIS — E11621 Type 2 diabetes mellitus with foot ulcer: Secondary | ICD-10-CM | POA: Diagnosis not present

## 2018-01-14 DIAGNOSIS — I10 Essential (primary) hypertension: Secondary | ICD-10-CM | POA: Diagnosis not present

## 2018-01-14 DIAGNOSIS — Z794 Long term (current) use of insulin: Secondary | ICD-10-CM | POA: Diagnosis not present

## 2018-01-14 DIAGNOSIS — G8929 Other chronic pain: Secondary | ICD-10-CM

## 2018-01-14 DIAGNOSIS — M79604 Pain in right leg: Principal | ICD-10-CM

## 2018-01-14 DIAGNOSIS — I70203 Unspecified atherosclerosis of native arteries of extremities, bilateral legs: Secondary | ICD-10-CM | POA: Diagnosis not present

## 2018-01-14 DIAGNOSIS — M899 Disorder of bone, unspecified: Secondary | ICD-10-CM

## 2018-01-14 DIAGNOSIS — Z9889 Other specified postprocedural states: Secondary | ICD-10-CM

## 2018-01-14 NOTE — Progress Notes (Unsigned)
CT lumbar

## 2018-01-15 ENCOUNTER — Encounter: Payer: Medicare Other | Admitting: Physician Assistant

## 2018-01-15 DIAGNOSIS — L97512 Non-pressure chronic ulcer of other part of right foot with fat layer exposed: Secondary | ICD-10-CM | POA: Diagnosis not present

## 2018-01-15 DIAGNOSIS — N186 End stage renal disease: Secondary | ICD-10-CM | POA: Diagnosis not present

## 2018-01-15 DIAGNOSIS — S91001A Unspecified open wound, right ankle, initial encounter: Secondary | ICD-10-CM | POA: Diagnosis not present

## 2018-01-15 DIAGNOSIS — S91002A Unspecified open wound, left ankle, initial encounter: Secondary | ICD-10-CM | POA: Diagnosis not present

## 2018-01-15 DIAGNOSIS — E1122 Type 2 diabetes mellitus with diabetic chronic kidney disease: Secondary | ICD-10-CM | POA: Diagnosis not present

## 2018-01-15 DIAGNOSIS — S91301A Unspecified open wound, right foot, initial encounter: Secondary | ICD-10-CM | POA: Diagnosis not present

## 2018-01-15 DIAGNOSIS — E11621 Type 2 diabetes mellitus with foot ulcer: Secondary | ICD-10-CM | POA: Diagnosis not present

## 2018-01-15 DIAGNOSIS — L97521 Non-pressure chronic ulcer of other part of left foot limited to breakdown of skin: Secondary | ICD-10-CM | POA: Diagnosis not present

## 2018-01-15 DIAGNOSIS — L97526 Non-pressure chronic ulcer of other part of left foot with bone involvement without evidence of necrosis: Secondary | ICD-10-CM | POA: Diagnosis not present

## 2018-01-15 DIAGNOSIS — S91302A Unspecified open wound, left foot, initial encounter: Secondary | ICD-10-CM | POA: Diagnosis not present

## 2018-01-15 DIAGNOSIS — I132 Hypertensive heart and chronic kidney disease with heart failure and with stage 5 chronic kidney disease, or end stage renal disease: Secondary | ICD-10-CM | POA: Diagnosis not present

## 2018-01-15 NOTE — Progress Notes (Signed)
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 additional overall worsening at the surgical site of the left third toe. With that being said he does have a lot of necrotic tissue overlying the surface of this region that I think a careful selective debridement would benefit. Obviously I'm not gonna recommend anything too aggressive at the site just due to his poor microvascular blood flow. Nonetheless if we have any chance of getting this to heal I think some careful debridement is going to be necessary. He has several blister areas unfortunately due to his swelling that is occurring. We are avoiding compression stockings at this time being that with his poor microvascular blood flow the last time he had these on everything turned a very poor blue color. The area that I marked to watch out for the red spreading of his foot does not appear to have caused any issues nor spread any further than what I saw during his office visit. Overall I'm extremely pleased with how things have progressed on not concerned as much today as I have been in the past with my visits. His daughter Kathlee Nations is present during the evaluation today. 01/08/18 on evaluation today patient appears to be doing a little worse all around in regard to his wounds. The right foot ulcers both on the posterior aspect of his Achilles region as well as the dorsal surface his foot both appear to be very dry. There are some areas that are drying out on the left foot as well but to be honest in most cases this is something that we probably want to happen in that regard. Again we have throughout the past several weeks and months discuss the possibility of invitation versus not. It is possible that he might would have to even have a bilateral below knee amputation if it proceeded to that degree. Nonetheless the patient up to this point has not wanted to even go down that road I'm definitely in agreement with honoring his wishes although I discussed with him  again today that if he decides he would like to talk to someone from a surgical standpoint regarding this I would be more than happy to make the referral for him. Patient History Levesque, KEBIN MAYE ([id number]) Information obtained from Patient. Social History Former smoker, Marital Status - Widowed, Alcohol Use - Rarely, Drug Use - No History, Caffeine Use - Never. Medical And Surgical History Notes Cardiovascular pacemaker, bypass  and stent in right leg, stent left leg Review of Systems (ROS) Constitutional Symptoms (General Health) Denies complaints or symptoms of Fever, Chills. Respiratory The patient has no complaints or symptoms. Cardiovascular The patient has no complaints or symptoms. Psychiatric The patient has no complaints or symptoms. Objective Constitutional Well-nourished and well-hydrated in no acute distress. Vitals Time Taken: 9:11 AM, Height: 68 in, Weight: 180 lbs, BMI: 27.4, Temperature: 98.1 F, Pulse: 64 bpm, Respiratory Rate: 16 breaths/min, Blood Pressure: 144/43 mmHg. Respiratory normal breathing without difficulty. clear to auscultation bilaterally. Cardiovascular regular rate and rhythm with normal S1, S2. Absent posterior tibial and dorsalis pedis pulses bilateral lower extremities. Psychiatric this patient is able to make decisions and demonstrates good insight into disease process. Alert and Oriented x 3. pleasant and cooperative. General Notes: Patient's wound bed currently shows evidence of good granulation at this time which is excellent news. There does not appear to be any evidence of worsening infection which is also good news. Which is also good news. In general I'm still worried about the fact that none of these areas really seem to be showing any signs of improvement and as I discussed with patient I'm not sure they are going to. Integumentary (Hair, Skin) Wound #1 status is Open. Original cause of wound was Gradually Appeared. The wound is  located on the Right Toe Second. The wound measures 1cm length x 1cm width x 0.3cm depth; 0.785cm^2 area and 0.236cm^3 volume. Wound #3 status is Open. Original cause of wound was Not Known. The wound is located on the Left,Lateral Toe Fifth. The wound measures 1cm length x 1.5cm width x 0.1cm depth; 1.178cm^2 area and 0.118cm^3 volume. Ahart, HUMPHREY GUERREIRO. ([id number]) Wound #4 status is Open. Original cause of wound was Trauma. The wound is located on the Left,Medial,Dorsal Foot. The wound measures 1cm length x 0.9cm width x 0.2cm depth; 0.707cm^2 area and 0.141cm^3 volume. Wound #5 status is Open. Original cause of wound was Surgical Injury. The wound is located on the Left Toe Third. The wound measures 3cm length x 1.2cm width x 0.4cm depth; 2.827cm^2 area and 1.131cm^3 volume. Wound #6 status is Open. Original cause of wound was Not Known. The wound is located on the Right,Dorsal Foot. The wound measures 4.5cm length x 4.8cm width x 0.1cm depth; 16.965cm^2 area and 1.696cm^3 volume. Wound #7 status is Open. Original cause of wound was Not Known. The wound is located on the Right,Posterior Ankle. The wound measures 0.7cm length x 0.9cm width x 0.1cm depth; 0.495cm^2 area and 0.049cm^3 volume. Wound #8 status is Open. Original cause of wound was Gradually Appeared. The wound is located on the Left,Medial Malleolus. The wound measures 2cm length x 2.8cm width x 0.1cm depth; 4.398cm^2 area and 0.44cm^3 volume. There is Fat Layer (Subcutaneous Tissue) Exposed exposed. There is no tunneling or undermining noted. There is a medium amount of serous drainage noted. The wound margin is flat and intact. There is medium (34-66%) pink granulation within the wound bed. There is a small (1-33%) amount of necrotic tissue within the wound bed including Adherent Slough. The periwound skin appearance exhibited: Callus, Crepitus, Excoriation, Induration, Rash, Scarring, Dry/Scaly, Maceration, Atrophie  Blanche, Cyanosis, Ecchymosis, Hemosiderin Staining, Mottled, Pallor, Rubor, Erythema. The surrounding wound skin color is noted with erythema. 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 May Shower, gently pat wound dry prior to applying new dressing. Wound #3 Left,Lateral Toe Fifth: Clean wound with Normal Saline. May Shower, gently pat wound dry prior to applying new dressing. Wound #4 Left,Medial,Dorsal Foot: Clean wound with Normal Saline. May Shower, gently pat wound dry prior to applying new dressing. Wound #6 Right,Dorsal Foot: Clean wound with Normal Saline. May Shower, gently pat wound dry prior to applying new dressing. Menz, ARVIS ZWAHLEN. ([id number]) Wound #7 Right,Posterior Ankle: Clean wound with Normal Saline. May Shower, gently pat wound dry prior to applying new dressing. Primary Wound Dressing: Wound #1 Right Toe Second: Hydrafera Blue Ready Transfer Wound #3 Left,Lateral Toe Fifth: Hydrafera Blue Ready Transfer Wound #4 Left,Medial,Dorsal Foot: Hydrafera Blue Ready Transfer Wound #5 Left Toe Third: Hydrafera Blue Ready Transfer Wound #6 Right,Dorsal Foot: Hydrogel Hydrafera Blue Ready Transfer Wound #7 Right,Posterior Ankle: Hydrafera Blue Ready Transfer Secondary Dressing: Wound #1 Right Toe Second: Other - Coverlet Wound #3 Left,Lateral Toe Fifth: Gauze and Kerlix/Conform Wound #4 Left,Medial,Dorsal Foot: Gauze and Kerlix/Conform Wound #6  Right,Dorsal Foot: Gauze and Kerlix/Conform Wound #7 Right,Posterior Ankle: Gauze and Kerlix/Conform Dressing Change Frequency: Wound #1 Right Toe Second: Change Dressing Monday, Wednesday, Friday - HOMEHEALTH-Monday, Wednesday, Friday Wound #3 Left,Lateral Toe Fifth: Change Dressing Monday, Wednesday, Friday - HOMEHEALTH-Monday, Wednesday, Friday Wound #4 Left,Medial,Dorsal Foot: Change dressing every day. - HHRN to change dressing three times weekly and [facility] will change dressing once and family will attempt to change dressing the other days Wound #6 Right,Dorsal Foot: Change Dressing Monday, Wednesday, Friday - HOMEHEALTH-Monday, Wednesday, Friday Wound #7 Right,Posterior Ankle: Change Dressing Monday, Wednesday, Friday - HOMEHEALTH-Monday, Wednesday, Friday Follow-up Appointments: Wound #1 Right Toe Second: Return Appointment in 2 weeks. Wound #3 Left,Lateral Toe Fifth: Return Appointment in 2 weeks. Wound #4 Left,Medial,Dorsal Foot: Return Appointment in 2 weeks. Wound #5 Left Toe Third: Return Appointment in 2 weeks. Wound #6 Right,Dorsal Foot: Return Appointment in 2 weeks. Wound #7 Right,Posterior Ankle: Return Appointment in 2 weeks. Home Health: Wound #1 Right Toe Second: Continue Home Health Visits - [facility]: Monday, Wednesday and Friday Home Health Nurse may visit PRN to address patient s wound care needs. FACE TO FACE ENCOUNTER: MEDICARE and MEDICAID PATIENTS: I certify that this patient is under my care and that I had a face-to-face encounter that meets the physician face-to-face encounter requirements with this patient on this date. The encounter with the patient was in whole or in part for the following MEDICAL CONDITION: (primary reason for Home [place], DEJOUR VOS. ([id number]) Healthcare) MEDICAL NECESSITY: I certify, that based on my findings, NURSING services are a medically necessary home health service. HOME BOUND STATUS: I certify that  my clinical findings support that this patient is homebound (i.e., Due to illness or injury, pt requires aid of supportive devices such as crutches, cane, wheelchairs, walkers, the use of special transportation or the assistance of another person to leave their place of residence. There is a normal inability to leave the home and doing so requires considerable and taxing effort. Other absences are for medical reasons / religious services and are infrequent or of short duration when for other reasons). If current dressing causes regression in wound condition, may D/C ordered dressing product/s and apply Normal Saline Moist Dressing daily until next [facility] / Other MD appointment. [place] of regression in wound condition at [phone]. Please direct any NON-WOUND related issues/requests for orders to patient's Primary Care Physician Wound #3 Left,Lateral Toe Fifth: [place] Visits - [facility]: Monday, Wednesday and Friday Home Health Nurse may visit PRN to address patient s wound care needs. FACE TO FACE ENCOUNTER: MEDICARE and MEDICAID PATIENTS: I certify that this patient is under my care and that I had a face-to-face encounter that meets the physician face-to-face encounter requirements with this patient on this date. The encounter with the patient was in whole or in part for the following MEDICAL CONDITION: (primary reason for [place]) MEDICAL NECESSITY: I certify, that based on my findings, NURSING services are a medically necessary home health service. HOME BOUND STATUS: I certify that my clinical findings support that this patient is homebound (i.e., Due to illness or injury, pt requires aid of supportive devices such as crutches, cane, wheelchairs, walkers, the use of special transportation or the assistance of another person to leave their place of residence. There is a normal inability to leave the home and doing so requires  considerable and taxing effort. Other absences are for medical reasons / religious services and are infrequent or of short duration when for other reasons). If current dressing causes regression in wound condition, may D/C ordered dressing product/s and apply Normal Saline Moist Dressing daily until next [place] / Other MD appointment. [place] of regression in wound condition at [phone]. Please direct any NON-WOUND related issues/requests for orders to patient's Primary Care Physician Wound #4 Left,Medial,Dorsal Foot: [place] Visits - [facility]: Monday, Wednesday and Friday Home Health Nurse may visit PRN to address patient s wound care needs. FACE TO FACE ENCOUNTER: MEDICARE and MEDICAID PATIENTS: I certify that this patient is under my care and that I had a face-to-face encounter that meets the physician face-to-face encounter requirements with this patient on this date. The encounter with the patient was in whole or in part for the following MEDICAL CONDITION: (primary reason for [place]) MEDICAL NECESSITY: I certify, that based on my findings, NURSING services are a medically necessary home health service. HOME BOUND STATUS: I certify that my clinical findings support that this patient is homebound (i.e., Due to illness or injury, pt requires aid of supportive devices such as crutches, cane, wheelchairs, walkers, the use of special transportation or the assistance of another person to leave their place of residence. There is a normal inability to leave the home and doing so requires considerable and taxing effort. Other absences are for medical reasons / religious services and are infrequent or of short duration when for other reasons). If current dressing causes regression in wound condition, may D/C ordered dressing product/s and apply Normal Saline Moist Dressing daily until next [place] / Other MD appointment. [place] of regression in wound condition at [phone]. Please direct any NON-WOUND related issues/requests for orders to patient's Primary Care Physician Wound #6 Right,Dorsal Foot: [place] Visits - [facility]: Monday, Wednesday and Friday Home Health Nurse may visit PRN to address patient s wound care needs. FACE TO FACE ENCOUNTER: MEDICARE and MEDICAID PATIENTS: I certify that this patient is under my care and that I had a face-to-face encounter that meets the physician face-to-face encounter requirements with this patient on this date. The encounter with the patient was in whole or in part for the following MEDICAL CONDITION: (primary  reason for Home Healthcare) MEDICAL NECESSITY: I certify, that based on my findings, NURSING services are a medically necessary home health service. HOME BOUND STATUS: I certify that my clinical findings support that this patient is homebound (i.e., Due to illness or injury, pt requires aid of supportive devices such as crutches, cane, wheelchairs, walkers, the use of special transportation or the assistance of another person to leave their place of residence. There is a normal inability to leave the home and doing so requires considerable and taxing effort. Other absences are for medical reasons / religious services and are infrequent or of short duration when for other reasons). If current dressing causes regression in wound condition, may D/C ordered dressing product/s and apply Normal Saline Moist Dressing daily until next [place] / Other MD appointment. [place] of regression in wound condition at [phone]. Please direct any NON-WOUND related issues/requests for orders to patient's Primary Care Physician Wound #7 Right,Posterior Ankle: [place] Visits - [facility]: Monday, Wednesday and Friday KHOI, HAMBERGER ([id number]) [place] Nurse may visit PRN to address patient s wound  care needs. FACE TO FACE ENCOUNTER: MEDICARE and MEDICAID PATIENTS: I certify that this patient is under my care and that I had a face-to-face encounter that meets the physician face-to-face encounter requirements with this patient on this date. The encounter with the patient was in whole or in part for the following MEDICAL CONDITION: (primary reason for [place]) MEDICAL NECESSITY: I certify, that based on my findings, NURSING services are a medically necessary home health service. HOME BOUND STATUS: I certify that my clinical findings support that this patient is homebound (i.e., Due to illness or injury, pt requires aid of supportive devices such as crutches, cane, wheelchairs, walkers, the use of special transportation or the assistance of another person to leave their place of residence. There is a normal inability to leave the home and doing so requires considerable and taxing effort. Other absences are for medical reasons / religious services and are infrequent or of short duration when for other reasons). If current dressing causes regression in wound condition, may D/C ordered dressing product/s and apply Normal Saline Moist Dressing daily until next [place] / Other MD appointment. [place] of regression in wound condition at [phone]. Please direct any NON-WOUND related issues/requests for orders to patient's Primary Care Physician At this point my suggestion is going to be that we switch to [facility] Dressing to see if this would be of benefit for the patient. He is in agreement with plan. We will add hydrogel to the right Achilles region as well as the dorsal surface of the right foot. Everything else we will help remain dry hopefully. The patient is in agreement with the plan. I do want to see were things stand in one weeks time. He's in agreement with that as well. If he is stable and everything seems okay at that point and we can go  back to a two week rotating cycle. Please see above for specific wound care orders. We will see patient for re-evaluation in 1 week(s) here in the clinic. If anything worsens or changes patient will contact our office for additional recommendations. Electronic Signature(s) Signed: 01/08/2018 9:49:28 PM By: Worthy Keeler PA-C Entered By: Worthy Keeler on 01/08/2018 11:07:44 Newmann, [name] ([id number]) -------------------------------------------------------------------------------- ROS/PFSH Details Patient Name: Crockett, Krosby S. Date of Service: 01/08/2018 9:15 AM Medical Record Number: [id number] Patient Account Number: [id number] Date  of Birth/Sex: 1924/11/18 (84 y.o. Male) Treating RN: Montey Hora Primary Care Provider: Deborra Medina Other Clinician: Referring Provider: Deborra Medina Treating Provider/Extender: Melburn Hake, HOYT Weeks in Treatment: 12 Information Obtained From Patient Wound History Constitutional Symptoms (General Health) Complaints and Symptoms: Negative for: Fever; Chills Eyes Medical History: Positive for: Cataracts Negative for: Glaucoma; Optic Neuritis Ear/Nose/Mouth/Throat Medical History: Negative for: Chronic sinus problems/congestion; Middle ear problems Hematologic/Lymphatic Medical History: Positive for: Lymphedema Negative for: Anemia; Hemophilia; Human Immunodeficiency Virus; Sickle Cell Disease Respiratory Complaints and Symptoms: No Complaints or Symptoms Medical History: Negative for: Aspiration; Asthma; Chronic Obstructive Pulmonary Disease (COPD); Pneumothorax; Sleep Apnea; Tuberculosis Cardiovascular Complaints and Symptoms: No Complaints or Symptoms Medical History: Positive for: Coronary Artery Disease; Hypertension; Peripheral Venous Disease Negative for: Angina; Arrhythmia; Congestive Heart Failure; Deep Vein Thrombosis; Hypotension; Myocardial Infarction; Peripheral Arterial Disease; Phlebitis; Vasculitis Past Medical  History Notes: pacemaker, 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 leg, stent left leg Gastrointestinal Lenz, Daris S. ([id number]) Medical History: Negative for: Cirrhosis ; Colitis; Crohnos; Hepatitis A; Hepatitis B; Hepatitis C Endocrine Medical History: Positive for: Type II Diabetes - since 1988 Negative for: Type I Diabetes Treated with: Insulin Blood sugar tested every day: Yes Tested : 3 times daily Blood sugar testing results: Breakfast: 114 Genitourinary Medical History: Positive for: End Stage Renal Disease Immunological Medical History: Negative for: Lupus Erythematosus; Raynaudos; Scleroderma Integumentary (Skin) Medical History: Negative for: History of Burn; History of pressure wounds Musculoskeletal Medical History: Positive for: Rheumatoid Arthritis Negative for: Gout; Osteoarthritis; Osteomyelitis Neurologic Medical History: Positive for: Neuropathy - feet and legs Negative for: Dementia; Quadriplegia; Paraplegia; Seizure Disorder Psychiatric Complaints and Symptoms: No Complaints or Symptoms Medical History: Negative for: Anorexia/bulimia; Confinement Anxiety HBO Extended History Items Eyes: Cataracts Immunizations Pneumococcal Vaccine: Received Pneumococcal Vaccination: No Implantable Devices Erhardt, KAWHI DIEBOLD ([id number]) Family and Social History Former smoker; Marital Status - Widowed; Alcohol Use: Rarely; Drug Use: No History; Caffeine Use: Never; Financial Concerns: No; Food, Clothing or Shelter Needs: No; Support System Lacking: No; Transportation Concerns: No; Advanced Directives: Yes (Not Provided); Patient does not want information on Advanced Directives; Do not resuscitate: No; Living Will: Yes (Not Provided); Medical Power of Attorney: Yes (Not Provided) Physician Affirmation I have reviewed and agree with the above information. Electronic Signature(s) Signed: 01/08/2018 5:28:33 PM By: Montey Hora Signed: 01/08/2018 9:49:28 PM By: Worthy Keeler PA-C Entered By: Worthy Keeler on 01/08/2018 11:05:54 [place], [name] ([id number]) -------------------------------------------------------------------------------- SuperBill Details Patient Name: Mcewen, Lux S. Date of Service: 01/08/2018 Medical Record Number: [id number] Patient Account Number: [id number] Date of Birth/Sex: 1924-10-01 (84 y.o. Male) Treating RN: Montey Hora Primary Care Provider: Deborra Medina Other Clinician: Referring Provider: Deborra Medina Treating Provider/Extender: Melburn Hake, HOYT Weeks in Treatment: 12 Diagnosis Coding ICD-10 Codes Code Description E11.621 Type 2 diabetes mellitus with foot ulcer E11.40 Type 2 diabetes mellitus with diabetic neuropathy, unspecified L97.512 Non-pressure chronic ulcer of other part of right foot with fat layer exposed 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 N18.9 Chronic kidney disease, unspecified I73.9 Peripheral vascular disease, unspecified J28.78 Chronic systolic (congestive) heart failure I10 Essential (primary) hypertension Facility Procedures CPT4 Code: [id number] Description: 70962 - WOUND CARE VISIT-LEV 5 EST PT Modifier: Quantity: 1 Physician Procedures CPT4: Description Modifier Quantity Code [id number] - WC PHYS LEVEL 4 - EST PT 1 ICD-10 Diagnosis Description E11.621 Type 2 diabetes mellitus with foot ulcer E11.40 Type 2 diabetes mellitus with diabetic neuropathy, unspecified L97.512 Non-pressure  chronic  ulcer of other part of right foot with fat layer exposed 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 Electronic Signature(s) Signed: 01/08/2018 9:49:28 PM By: Worthy Keeler PA-C Signed: 01/13/2018 5:05:35 PM By: Harold Barban Entered By: Harold Barban on 01/08/2018 16:52:57

## 2018-01-15 NOTE — Progress Notes (Signed)
MASE, DHONDT ([id number]) Visit Report for 01/08/2018 Arrival Information Details Patient Name: [name], [name]. Date of Service: 01/08/2018 9:15 AM Medical Record Number: [id number] Patient Account Number: [id number] Date of Birth/Sex: 1924-03-21 (84 y.o. Male) Treating RN: Montey Hora Primary Care Hattie Pine: Deborra Medina Other Clinician: Referring Tahirih Lair: Deborra Medina Treating Ahlaya Ende/Extender: Melburn Hake, HOYT Weeks in Treatment: 12 Visit Information History Since Last Visit Added or deleted any medications: Yes Patient Arrived: Walker Any new allergies or adverse reactions: No Arrival Time: 09:09 Had a fall or experienced change in No Accompanied By: self activities of daily living that may affect Transfer Assistance: None risk of falls: Patient Identification Verified: Yes Signs or symptoms of abuse/neglect since last visito No Secondary Verification Process Completed: Yes Hospitalized since last visit: No Implantable device outside of the clinic excluding No cellular tissue based products placed in the center since last visit: Has Dressing in Place as Prescribed: Yes Pain Present Now: No Electronic Signature(s) Signed: 01/08/2018 1:06:46 PM By: Lorine Bears RCP, RRT, CHT Entered By: Becky Sax, Amado Nash on 01/08/2018 09:11:08 [name] ([id number]) -------------------------------------------------------------------------------- Clinic Level of Care Assessment Details Patient Name: [name], [name] POMPLUN. Date of Service: 01/08/2018 9:15 AM Medical Record Number: [id number] Patient Account Number: [id number] Date of Birth/Sex: 1925/01/10 (84 y.o. Male) Treating RN: Harold Barban Primary Care Nitisha Civello: Deborra Medina Other Clinician: Referring Gracin Mcpartland: Deborra Medina Treating Oaklynn Stierwalt/Extender: Melburn Hake, HOYT Weeks in Treatment: 12 Clinic Level of Care Assessment Items TOOL 4 Quantity Score []  - Use when only an EandM is performed  on FOLLOW-UP visit 0 ASSESSMENTS - Nursing Assessment / Reassessment X - Reassessment of Co-morbidities (includes updates in patient status) 1 10 X- 1 5 Reassessment of Adherence to Treatment Plan ASSESSMENTS - Wound and Skin Assessment / Reassessment []  - Simple Wound Assessment / Reassessment - one wound 0 X- 7 5 Complex Wound Assessment / Reassessment - multiple wounds []  - 0 Dermatologic / Skin Assessment (not related to wound area) ASSESSMENTS - Focused Assessment []  - Circumferential Edema Measurements - multi extremities 0 []  - 0 Nutritional Assessment / Counseling / Intervention []  - 0 Lower Extremity Assessment (monofilament, tuning fork, pulses) []  - 0 Peripheral Arterial Disease Assessment (using hand held doppler) ASSESSMENTS - Ostomy and/or Continence Assessment and Care []  - Incontinence Assessment and Management 0 []  - 0 Ostomy Care Assessment and Management (repouching, etc.) PROCESS - Coordination of Care X - Simple Patient / Family Education for ongoing care 1 15 []  - 0 Complex (extensive) Patient / Family Education for ongoing care X- 1 10 Staff obtains Programmer, systems, Records, Test Results / Process Orders []  - 0 Staff telephones HHA, Nursing Homes / Clarify orders / etc []  - 0 Routine Transfer to another Facility (non-emergent condition) []  - 0 Routine Hospital Admission (non-emergent condition) []  - 0 New Admissions / Biomedical engineer / Ordering NPWT, Apligraf, etc. []  - 0 Emergency Hospital Admission (emergent condition) X- 1 10 Simple Discharge Coordination [name], [name] ([id number]) []  - 0 Complex (extensive) Discharge Coordination PROCESS - Special Needs []  - Pediatric / Minor Patient Management 0 []  - 0 Isolation Patient Management []  - 0 Hearing / Language / Visual special needs []  - 0 Assessment of Community assistance (transportation, D/C planning, etc.) []  - 0 Additional assistance / Altered mentation []  - 0 Support  Surface(s) Assessment (bed, cushion, seat, etc.) INTERVENTIONS - Wound Cleansing / Measurement []  - Simple Wound Cleansing - one wound 0 X- 7 5 Complex Wound Cleansing - multiple wounds X- 1  5 Wound Imaging (photographs - any number of wounds) []  - 0 Wound Tracing (instead of photographs) []  - 0 Simple Wound Measurement - one wound X- 7 5 Complex Wound Measurement - multiple wounds INTERVENTIONS - Wound Dressings []  - Small Wound Dressing one or multiple wounds 0 X- 1 15 Medium Wound Dressing one or multiple wounds []  - 0 Large Wound Dressing one or multiple wounds X- 1 5 Application of Medications - topical []  - 0 Application of Medications - injection INTERVENTIONS - Miscellaneous []  - External ear exam 0 []  - 0 Specimen Collection (cultures, biopsies, blood, body fluids, etc.) []  - 0 Specimen(s) / Culture(s) sent or taken to Lab for analysis []  - 0 Patient Transfer (multiple staff / Civil Service fast streamer / Similar devices) []  - 0 Simple Staple / Suture removal (25 or less) []  - 0 Complex Staple / Suture removal (26 or more) []  - 0 Hypo / Hyperglycemic Management (close monitor of Blood Glucose) []  - 0 Ankle / Brachial Index (ABI) - do not check if billed separately X- 1 5 Vital Signs [name], [name] ([id number]) Has the patient been seen at the hospital within the last three years: Yes Total Score: 185 Level Of Care: New/Established - Level 5 Electronic Signature(s) Signed: 01/13/2018 5:05:35 PM By: Harold Barban Entered By: Harold Barban on 01/08/2018 10:14:51 [name] ([id number]) -------------------------------------------------------------------------------- Encounter Discharge Information Details Patient Name: [name], [name] Date of Service: 01/08/2018 9:15 AM Medical Record Number: [id number] Patient Account Number: [id number] Date of Birth/Sex: May 12, 1924 (84 y.o. Male) Treating RN: Cornell Barman Primary Care Niranjan Rufener: Deborra Medina Other  Clinician: Referring Kalani Sthilaire: Deborra Medina Treating Denene Alamillo/Extender: Melburn Hake, HOYT Weeks in Treatment: 12 Encounter Discharge Information Items Discharge Condition: Stable Ambulatory Status: Walker Discharge Destination: Home Transportation: Private Auto Accompanied By: self Schedule Follow-up Appointment: Yes Clinical Summary of Care: Electronic Signature(s) Signed: 01/08/2018 5:33:18 PM By: Gretta Cool, BSN, RN, CWS, Kim RN, BSN Entered By: Gretta Cool, BSN, RN, CWS, Kim on 01/08/2018 10:34:06 Pfenning, [name] ([id number]) -------------------------------------------------------------------------------- Lower Extremity Assessment Details Patient Name: [name], [name] Date of Service: 01/08/2018 9:15 AM Medical Record Number: [id number] Patient Account Number: [id number] Date of Birth/Sex: 09/02/1924 (84 y.o. Male) Treating RN: Cornell Barman Primary Care Klye Besecker: Deborra Medina Other Clinician: Referring Deklan Minar: Deborra Medina Treating Cavan Bearden/Extender: Melburn Hake, HOYT Weeks in Treatment: 12 Edema Assessment Assessed: Shirlyn Goltz: No] [Right: No] [Left: Edema] [Right: :] Calf Left: Right: Point of Measurement: 30 cm From Medial Instep 35 cm 34 cm Ankle Left: Right: Point of Measurement: 10 cm From Medial Instep 24 cm 24 cm Vascular Assessment Pulses: Dorsalis Pedis Palpable: [Left:No] [Right:No] Doppler Audible: [Left:Yes] [Right:Yes] Posterior Tibial Extremity colors, hair growth, and conditions: Extremity Color: [Left:Red] [Right:Normal] Hair Growth on Extremity: [Left:No] [Right:No] Temperature of Extremity: [Left:Warm] [Right:Warm] Notes Toe nails are paper thin and almost gone on toes. Electronic Signature(s) Signed: 01/08/2018 5:33:18 PM By: Gretta Cool, BSN, RN, CWS, Kim RN, BSN Entered By: Gretta Cool, BSN, RN, CWS, Kim on 01/08/2018 09:51:49 Fackler, [name] ([id number]) -------------------------------------------------------------------------------- Multi Wound Chart  Details Patient Name: [name], [name] Date of Service: 01/08/2018 9:15 AM Medical Record Number: [id number] Patient Account Number: [id number] Date of Birth/Sex: Sep 23, 1924 (84 y.o. Male) Treating RN: Harold Barban Primary Care Bijal Siglin: Deborra Medina Other Clinician: Referring Jamesen Stahnke: Deborra Medina Treating Lannie Yusuf/Extender: Melburn Hake, HOYT Weeks in Treatment: 12 Vital Signs Height(in): 68 Pulse(bpm): 64 Weight(lbs): 180 Blood Pressure(mmHg): 144/43 Body Mass Index(BMI): 27 Temperature(F): 98.1 Respiratory Rate 16 (breaths/min): Photos: [1:No Photos] [3:No Photos] [4:No Photos] Wound Location: [  1:Right Toe Second] [3:Left, Lateral Toe Fifth] [4:Left, Medial, Dorsal Foot] Wounding Event: [1:Gradually Appeared] [3:Not Known] [4:Trauma] Primary Etiology: [1:Diabetic Wound/Ulcer of the Lower Extremity] [3:Diabetic Wound/Ulcer of the Lower Extremity] [4:Diabetic Wound/Ulcer of the Lower Extremity] Secondary Etiology: [1:Arterial Insufficiency Ulcer] [3:Arterial Insufficiency Ulcer] [4:Arterial Insufficiency Ulcer] Comorbid History: [1:N/A] [3:N/A] [4:N/A] Date Acquired: [1:06/04/2017] [3:08/05/2017] [4:11/20/2017] Weeks of Treatment: [1:12] [3:6] [4:6] Wound Status: [1:Open] [3:Open] [4:Open] Pending Amputation on [1:Yes] [3:No] [4:No] Presentation: Measurements L x W x D [1:1x1x0.3] [3:1x1.5x0.1] [4:1x0.9x0.2] (cm) Area (cm) : [1:0.785] [3:1.178] [4:0.707] Volume (cm) : [1:0.236] [3:0.118] [4:0.141] % Reduction in Area: [1:33.40%] [3:-150.10%] [4:-28.50%] % Reduction in Volume: [1:0.00%] [3:-151.10%] [4:-28.20%] Classification: [1:Grade 2] [3:Grade 2] [4:Grade 2] Exudate Amount: [1:N/A] [3:N/A] [4:N/A] Exudate Type: [1:N/A] [3:N/A] [4:N/A] Exudate Color: [1:N/A] [3:N/A] [4:N/A] Wound Margin: [1:N/A] [3:N/A] [4:N/A] Granulation Amount: [1:N/A] [3:N/A] [4:N/A] Granulation Quality: [1:N/A] [3:N/A] [4:N/A] Necrotic Amount: [1:N/A] [3:N/A] [4:N/A] Epithelialization:  [1:N/A] [3:N/A] [4:N/A] Periwound Skin Texture: [1:No Abnormalities Noted] [3:No Abnormalities Noted] [4:No Abnormalities Noted] Periwound Skin Moisture: [1:No Abnormalities Noted] [3:No Abnormalities Noted] [4:No Abnormalities Noted] Periwound Skin Color: [1:No Abnormalities Noted No] [3:No Abnormalities Noted No] [4:No Abnormalities Noted No] Wound Number: 5 6 7  Photos: No Photos No Photos No Photos Wound Location: Left Toe Third Right, Dorsal Foot Right, Posterior Ankle Wounding Event: Surgical Injury Not Known Not Known [name], [name] ([id number]) Primary Etiology: Arterial Insufficiency Ulcer To be determined To be determined Secondary Etiology: N/A N/A N/A Comorbid History: N/A N/A N/A Date Acquired: 11/18/2017 12/25/2017 12/25/2017 Weeks of Treatment: 4 1 1  Wound Status: Open Open Open Pending Amputation on No No No Presentation: Measurements L x W x D 3x1.2x0.4 4.5x4.8x0.1 0.7x0.9x0.1 (cm) Area (cm) : 2.827 16.965 0.495 Volume (cm) : 1.131 1.696 0.049 % Reduction in Area: -20.00% 13.40% -57.60% % Reduction in Volume: 40.00% 13.40% -58.10% Classification: Unclassifiable Full Thickness Without Full Thickness Without Exposed Support Structures Exposed Support Structures Exudate Amount: N/A N/A N/A Exudate Type: N/A N/A N/A Exudate Color: N/A N/A N/A Wound Margin: N/A N/A N/A Granulation Amount: N/A N/A N/A Granulation Quality: N/A N/A N/A Necrotic Amount: N/A N/A N/A Epithelialization: N/A N/A N/A Periwound Skin Texture: No Abnormalities Noted No Abnormalities Noted No Abnormalities Noted Periwound Skin Moisture: No Abnormalities Noted No Abnormalities Noted No Abnormalities Noted Periwound Skin Color: No Abnormalities Noted No Abnormalities Noted No Abnormalities Noted Tenderness on Palpation: No No No Wound Number: 8 N/A N/A Photos: No Photos N/A N/A Wound Location: Left Malleolus - Medial N/A N/A Wounding Event: Gradually Appeared N/A N/A Primary Etiology:  Arterial Insufficiency Ulcer N/A N/A Secondary Etiology: N/A N/A N/A Comorbid History: Cataracts, Lymphedema, N/A N/A Coronary Artery Disease, Hypertension, Peripheral Venous Disease, Type II Diabetes, End Stage Renal Disease, Rheumatoid Arthritis, Neuropathy Date Acquired: 12/29/2017 N/A N/A Weeks of Treatment: 0 N/A N/A Wound Status: Open N/A N/A Pending Amputation on No N/A N/A Presentation: Measurements L x W x D 2x2.8x0.1 N/A N/A (cm) Area (cm) : 4.398 N/A N/A Volume (cm) : 0.44 N/A N/A % Reduction in Area: N/A N/A N/A % Reduction in Volume: N/A N/A N/A Classification: Full Thickness Without N/A N/A Exposed Support Structures Exudate Amount: Medium N/A N/A Exudate Type: Serous N/A N/A [name], [name] ([id number]) Exudate Color: amber N/A N/A Wound Margin: Flat and Intact N/A N/A Granulation Amount: Medium (34-66%) N/A N/A Granulation Quality: Pink N/A N/A Necrotic Amount: Small (1-33%) N/A N/A Exposed Structures: Fat Layer (Subcutaneous N/A N/A Tissue) Exposed: Yes Fascia: No Tendon: No Muscle: No Joint: No Bone: No  Epithelialization: None N/A N/A Periwound Skin Texture: Excoriation: Yes N/A N/A Induration: Yes Callus: Yes Crepitus: Yes Rash: Yes Scarring: Yes Periwound Skin Moisture: Maceration: Yes N/A N/A Dry/Scaly: Yes Periwound Skin Color: Atrophie Blanche: Yes N/A N/A Cyanosis: Yes Ecchymosis: Yes Erythema: Yes Hemosiderin Staining: Yes Mottled: Yes Pallor: Yes Rubor: Yes Tenderness on Palpation: No N/A N/A Treatment Notes Electronic Signature(s) Signed: 01/13/2018 5:05:35 PM By: Harold Barban Entered By: Harold Barban on 01/08/2018 10:01:46 Spiewak, [name] ([id number]) -------------------------------------------------------------------------------- [place] Details Patient Name: Rather, Cassell S. Date of Service: 01/08/2018 9:15 AM Medical Record Number: [id number] Patient Account Number: [id number] Date of  Birth/Sex: August 14, 1924 (84 y.o. Male) Treating RN: Harold Barban Primary Care Tadarius Maland: Deborra Medina Other Clinician: Referring Meril Dray: Deborra Medina Treating Robyne Matar/Extender: Melburn Hake, HOYT Weeks in Treatment: 12 Active Inactive Abuse / Safety / Falls / Self Care Management Nursing Diagnoses: Potential for falls Goals: Patient will remain injury free related to falls Date Initiated: 10/10/2017 Target Resolution Date: 01/02/2018 Goal Status: Active Interventions: Assess fall risk on admission and as needed Notes: Orientation to the Wound Care Program Nursing Diagnoses: Knowledge deficit related to the wound healing center program Goals: Patient/caregiver will verbalize understanding of the [facility] Date Initiated: 10/10/2017 Target Resolution Date: 01/02/2018 Goal Status: Active Interventions: Provide education on orientation to the wound center Notes: Pain, Acute or Chronic Nursing Diagnoses: Potential alteration in comfort, pain Goals: Patient/caregiver will verbalize adequate pain control between visits Date Initiated: 10/10/2017 Target Resolution Date: 01/02/2018 Goal Status: Active Interventions: Complete pain assessment as per visit requirements Birks, KIRKE BREACH ([id number]) Notes: Wound/Skin Impairment Nursing Diagnoses: Impaired tissue integrity Goals: Ulcer/skin breakdown will heal within 14 weeks Date Initiated: 10/10/2017 Target Resolution Date: 01/02/2018 Goal Status: Active Interventions: Assess patient/caregiver ability to obtain necessary supplies Assess patient/caregiver ability to perform ulcer/skin care regimen upon admission and as needed Assess ulceration(s) every visit Notes: Electronic Signature(s) Signed: 01/13/2018 5:05:35 PM By: Harold Barban Entered By: Harold Barban on 01/08/2018 10:01:37 Mirabal, [name] ([id number]) -------------------------------------------------------------------------------- Pain  Assessment Details Patient Name: [name], [name] Date of Service: 01/08/2018 9:15 AM Medical Record Number: [id number] Patient Account Number: [id number] Date of Birth/Sex: 06-Jan-1925 (84 y.o. Male) Treating RN: Montey Hora Primary Care Nathen Balaban: Deborra Medina Other Clinician: Referring Laruth Hanger: Deborra Medina Treating Corleen Otwell/Extender: Melburn Hake, HOYT Weeks in Treatment: 12 Active Problems Location of Pain Severity and Description of Pain Patient Has Paino No Site Locations Pain Management and Medication Current Pain Management: Electronic Signature(s) Signed: 01/08/2018 1:06:46 PM By: Paulla Fore, RRT, CHT Signed: 01/08/2018 5:28:33 PM By: Montey Hora Entered By: Lorine Bears on 01/08/2018 09:11:17 [name], [name] ([id number]) -------------------------------------------------------------------------------- Patient/Caregiver Education Details Patient Name: Grime, Johanthan S. Date of Service: 01/08/2018 9:15 AM Medical Record Number: [id number] Patient Account Number: [id number] Date of Birth/Gender: 07/16/1924 (84 y.o. Male) Treating RN: Harold Barban Primary Care Physician: Deborra Medina Other Clinician: Referring Physician: Deborra Medina Treating Physician/Extender: Melburn Hake, HOYT Weeks in Treatment: 12 Education Assessment Education Provided To: Patient Education Topics Provided Wound/Skin Impairment: Handouts: Caring for Your Ulcer Methods: Demonstration, Explain/Verbal Responses: State content correctly Electronic Signature(s) Signed: 01/08/2018 5:33:18 PM By: Gretta Cool, BSN, RN, CWS, Kim RN, BSN Entered By: Gretta Cool, BSN, RN, CWS, Kim on 01/08/2018 10:36:38 Spaid, [name] ([id number]) -------------------------------------------------------------------------------- Wound Assessment Details Patient Name: [name], [name] Date of Service: 01/08/2018 9:15 AM Medical Record Number: [id number] Patient Account Number:  [id number] Date of Birth/Sex: 05-02-1924 (84 y.o. Male) Treating RN: Cornell Barman Primary Care Orean Giarratano: Deborra Medina Other  Clinician: Referring Harmonii Karle: Deborra Medina Treating Trixy Loyola/Extender: Melburn Hake, HOYT Weeks in Treatment: 12 Wound Status Wound Number: 1 Primary Etiology: Diabetic Wound/Ulcer of the Lower Extremity Wound Location: Right Toe Second Secondary Arterial Insufficiency Ulcer Wounding Event: Gradually Appeared Etiology: Date Acquired: 06/04/2017 Wound Status: Open Weeks Of Treatment: 12 Clustered Wound: No Pending Amputation On Presentation Photos Photo Uploaded By: Gretta Cool, BSN, RN, CWS, Kim on 01/08/2018 13:02:14 Wound Measurements Length: (cm) 1 Width: (cm) 1 Depth: (cm) 0.3 Area: (cm) 0.785 Volume: (cm) 0.236 % Reduction in Area: 33.4% % Reduction in Volume: 0% Wound Description Classification: Grade 2 Periwound Skin Texture Texture Color No Abnormalities Noted: No No Abnormalities Noted: No Moisture No Abnormalities Noted: No Treatment Notes Wound #1 (Right Toe Second) Notes all wounds hydrofera blue; hydrogel on dorsal right foot, kerlix and tubigrip Electronic Signature(s) Bodie, ARNET HOFFERBER ([id number]) Signed: 01/08/2018 5:33:18 PM By: Gretta Cool, BSN, RN, CWS, Kim RN, BSN Entered By: Gretta Cool, BSN, RN, CWS, Kim on 01/08/2018 09:43:21 Sheller, [name] ([id number]) -------------------------------------------------------------------------------- Wound Assessment Details Patient Name: [name], [name] Date of Service: 01/08/2018 9:15 AM Medical Record Number: [id number] Patient Account Number: [id number] Date of Birth/Sex: Jun 13, 1924 (84 y.o. Male) Treating RN: Cornell Barman Primary Care Hearl Heikes: Deborra Medina Other Clinician: Referring Darneshia Demary: Deborra Medina Treating Aceton Kinnear/Extender: Melburn Hake, HOYT Weeks in Treatment: 12 Wound Status Wound Number: 3 Primary Etiology: Diabetic Wound/Ulcer of the Lower Extremity Wound Location: Left, Lateral  Toe Fifth Secondary Arterial Insufficiency Ulcer Wounding Event: Not Known Etiology: Date Acquired: 08/05/2017 Wound Status: Open Weeks Of Treatment: 6 Clustered Wound: No Photos Photo Uploaded By: Gretta Cool, BSN, RN, CWS, Kim on 01/08/2018 13:02:14 Wound Measurements Length: (cm) 1 Width: (cm) 1.5 Depth: (cm) 0.1 Area: (cm) 1.178 Volume: (cm) 0.118 % Reduction in Area: -150.1% % Reduction in Volume: -151.1% Wound Description Classification: Grade 2 Periwound Skin Texture Texture Color No Abnormalities Noted: No No Abnormalities Noted: No Moisture No Abnormalities Noted: No Treatment Notes Wound #3 (Left, Lateral Toe Fifth) Notes all wounds hydrofera blue; hydrogel on dorsal right foot, kerlix and tubigrip Electronic Signature(s) Signed: 01/08/2018 5:33:18 PM By: Gretta Cool, BSN, RN, CWS, Kim RN, BSN [place], [place] ([id number]) Entered By: Gretta Cool, BSN, RN, CWS, Kim on 01/08/2018 09:44:42 Ludke, [name] ([id number]) -------------------------------------------------------------------------------- Wound Assessment Details Patient Name: [name], Ephram S. Date of Service: 01/08/2018 9:15 AM Medical Record Number: [id number] Patient Account Number: [id number] Date of Birth/Sex: Jan 07, 1925 (84 y.o. Male) Treating RN: Cornell Barman Primary Care Ritika Hellickson: Deborra Medina Other Clinician: Referring Rinaldo Macqueen: Deborra Medina Treating Jakya Dovidio/Extender: Melburn Hake, HOYT Weeks in Treatment: 12 Wound Status Wound Number: 4 Primary Etiology: Diabetic Wound/Ulcer of the Lower Extremity Wound Location: Left, Medial, Dorsal Foot Secondary Arterial Insufficiency Ulcer Wounding Event: Trauma Etiology: Date Acquired: 11/20/2017 Wound Status: Open Weeks Of Treatment: 6 Clustered Wound: No Photos Photo Uploaded By: Gretta Cool, BSN, RN, CWS, Kim on 01/08/2018 13:03:25 Wound Measurements Length: (cm) 1 Width: (cm) 0.9 Depth: (cm) 0.2 Area: (cm) 0.707 Volume: (cm) 0.141 % Reduction in Area:  -28.5% % Reduction in Volume: -28.2% Wound Description Classification: Grade 2 Periwound Skin Texture Texture Color No Abnormalities Noted: No No Abnormalities Noted: No Moisture No Abnormalities Noted: No Treatment Notes Wound #4 (Left, Medial, Dorsal Foot) Notes all wounds hydrofera blue; hydrogel on dorsal right foot, kerlix and tubigrip Electronic Signature(s) Signed: 01/08/2018 5:33:18 PM By: Gretta Cool, BSN, RN, CWS, Kim RN, BSN [place], [place] ([id number]) Entered By: Gretta Cool, BSN, RN, CWS, Kim on 01/08/2018 09:44:42 Feenstra, [name] ([id number]) -------------------------------------------------------------------------------- Wound Assessment Details Patient Name: Garlington,  Kristoffer S. Date of Service: 01/08/2018 9:15 AM Medical Record Number: [id number] Patient Account Number: [id number] Date of Birth/Sex: [birth year] (84 y.o. Male) Treating RN: Cornell Barman Primary Care Jakolby Sedivy: Deborra Medina Other Clinician: Referring Lyndsay Talamante: Deborra Medina Treating Krystel Fletchall/Extender: Melburn Hake, HOYT Weeks in Treatment: 12 Wound Status Wound Number: 5 Primary Etiology: Arterial Insufficiency Ulcer Wound Location: Left Toe Third Wound Status: Open Wounding Event: Surgical Injury Date Acquired: 11/18/2017 Weeks Of Treatment: 4 Clustered Wound: No Photos Photo Uploaded By: Gretta Cool, BSN, RN, CWS, Kim on 01/08/2018 13:03:25 Wound Measurements Length: (cm) 3 Width: (cm) 1.2 Depth: (cm) 0.4 Area: (cm) 2.827 Volume: (cm) 1.131 % Reduction in Area: -20% % Reduction in Volume: 40% Wound Description Classification: Unclassifiable Periwound Skin Texture Texture Color No Abnormalities Noted: No No Abnormalities Noted: No Moisture No Abnormalities Noted: No Treatment Notes Wound #5 (Left Toe Third) Notes all wounds hydrofera blue; hydrogel on dorsal right foot, kerlix and tubigrip Electronic Signature(s) Signed: 01/08/2018 5:33:18 PM By: Gretta Cool, BSN, RN, CWS, Kim RN, BSN [place], [place] ([id number]) Entered By: Gretta Cool, BSN, RN, CWS, Kim on 01/08/2018 09:44:42 Mote, [name] ([id number]) -------------------------------------------------------------------------------- Wound Assessment Details Patient Name: Marciel, Bracen S. Date of Service: 01/08/2018 9:15 AM Medical Record Number: [id number] Patient Account Number: [id number] Date of Birth/Sex: [birth year] (84 y.o. Male) Treating RN: Cornell Barman Primary Care Safiyya Stokes: Deborra Medina Other Clinician: Referring Dashanna Kinnamon: Deborra Medina Treating Geni Skorupski/Extender: STONE III, HOYT Weeks in Treatment: 12 Wound Status Wound Number: 6 Primary Etiology: To be determined Wound Location: Right, Dorsal Foot Wound Status: Open Wounding Event: Not Known Date Acquired: 12/25/2017 Weeks Of Treatment: 1 Clustered Wound: No Wound Measurements Length: (cm) 4.5 Width: (cm) 4.8 Depth: (cm) 0.1 Area: (cm) 16.965 Volume: (cm) 1.696 % Reduction in Area: 13.4% % Reduction in Volume: 13.4% Wound Description Full Thickness Without Exposed Support Classification: Structures Periwound Skin Texture Texture Color No Abnormalities Noted: No No Abnormalities Noted: No Moisture No Abnormalities Noted: No Treatment Notes Wound #6 (Right, Dorsal Foot) Notes all wounds hydrofera blue; hydrogel on dorsal right foot, kerlix and tubigrip Electronic Signature(s) Signed: 01/08/2018 5:33:18 PM By: Gretta Cool, BSN, RN, CWS, Kim RN, BSN Entered By: Gretta Cool, BSN, RN, CWS, Kim on 01/08/2018 09:44:42 Vink, [name] ([id number]) -------------------------------------------------------------------------------- Wound Assessment Details Patient Name: Reimers, Deval S. Date of Service: 01/08/2018 9:15 AM Medical Record Number: [id number] Patient Account Number: [id number] Date of Birth/Sex: 07-22-1924 (84 y.o. Male) Treating RN: Cornell Barman Primary Care Deeana Atwater: Deborra Medina Other Clinician: Referring Guage Efferson: Deborra Medina Treating  Shawnelle Spoerl/Extender: STONE III, HOYT Weeks in Treatment: 12 Wound Status Wound Number: 7 Primary Etiology: To be determined Wound Location: Right, Posterior Ankle Wound Status: Open Wounding Event: Not Known Date Acquired: 12/25/2017 Weeks Of Treatment: 1 Clustered Wound: No Photos Photo Uploaded By: Gretta Cool, BSN, RN, CWS, Kim on 01/08/2018 13:05:13 Wound Measurements Length: (cm) 0.7 Width: (cm) 0.9 Depth: (cm) 0.1 Area: (cm) 0.495 Volume: (cm) 0.049 % Reduction in Area: -57.6% % Reduction in Volume: -58.1% Wound Description Full Thickness Without Exposed Support Classification: Structures Periwound Skin Texture Texture Color No Abnormalities Noted: No No Abnormalities Noted: No Moisture No Abnormalities Noted: No Treatment Notes Wound #7 (Right, Posterior Ankle) Notes all wounds hydrofera blue; hydrogel on dorsal right foot, kerlix and tubigrip Electronic Signature(s) Thomley, KEMON DEVINCENZI ([id number]) Signed: 01/08/2018 5:33:18 PM By: Gretta Cool, BSN, RN, CWS, Kim RN, BSN Entered By: Gretta Cool, BSN, RN, CWS, Kim on 01/08/2018 09:44:43 Heide, [name] ([id number]) -------------------------------------------------------------------------------- Wound Assessment Details Patient Name: Raffield, Zachory S. Date of Service: 01/08/2018 9:15  AM Medical Record Number: [id number] Patient Account Number: [id number] Date of Birth/Sex: 1924-08-17 (84 y.o. Male) Treating RN: Cornell Barman Primary Care Fransico Sciandra: Deborra Medina Other Clinician: Referring Kurt Azimi: Deborra Medina Treating Tylicia Sherman/Extender: Melburn Hake, HOYT Weeks in Treatment: 12 Wound Status Wound Number: 8 Primary Arterial Insufficiency Ulcer Etiology: Wound Location: Left Malleolus - Medial Wound Open Wounding Event: Gradually Appeared Status: Date Acquired: 12/29/2017 Comorbid Cataracts, Lymphedema, Coronary Artery Weeks Of Treatment: 0 History: Disease, Hypertension, Peripheral Venous Clustered Wound: No Disease, Type  II Diabetes, End Stage Renal Disease, Rheumatoid Arthritis, Neuropathy Photos Photo Uploaded By: Gretta Cool, BSN, RN, CWS, Kim on 01/08/2018 13:05:13 Wound Measurements Length: (cm) 2 Width: (cm) 2.8 Depth: (cm) 0.1 Area: (cm) 4.398 Volume: (cm) 0.44 % Reduction in Area: % Reduction in Volume: Epithelialization: None Tunneling: No Undermining: No Wound Description Full Thickness Without Exposed Support Foul Odo Classification: Structures Slough/F Wound Margin: Flat and Intact Exudate Medium Amount: Exudate Type: Serous Exudate Color: amber r After Cleansing: No ibrino Yes Wound Bed Granulation Amount: Medium (34-66%) Exposed Structure Granulation Quality: Pink Fascia Exposed: No Necrotic Amount: Small (1-33%) Fat Layer (Subcutaneous Tissue) Exposed: Yes Necrotic Quality: Adherent Slough Tendon Exposed: No Muscle Exposed: No Joint Exposed: No Rothbauer, Spike S. ([id number]) Bone Exposed: No Periwound Skin Texture Texture Color No Abnormalities Noted: No No Abnormalities Noted: No Callus: Yes Atrophie Blanche: Yes Crepitus: Yes Cyanosis: Yes Excoriation: Yes Ecchymosis: Yes Induration: Yes Erythema: Yes Rash: Yes Hemosiderin Staining: Yes Scarring: Yes Mottled: Yes Pallor: Yes Moisture Rubor: Yes No Abnormalities Noted: No Dry / Scaly: Yes Maceration: Yes Treatment Notes Wound #8 (Left, Medial Malleolus) Notes all wounds hydrofera blue; hydrogel on dorsal right foot, kerlix and tubigrip Electronic Signature(s) Signed: 01/08/2018 5:33:18 PM By: Gretta Cool, BSN, RN, CWS, Kim RN, BSN Entered By: Gretta Cool, BSN, RN, CWS, Kim on 01/08/2018 09:43:05 Burridge, [name] ([id number]) -------------------------------------------------------------------------------- Vitals Details Patient Name: Frasco, Dionicio S. Date of Service: 01/08/2018 9:15 AM Medical Record Number: [id number] Patient Account Number: [id number] Date of Birth/Sex: [birth year] (84 y.o. Male) Treating  RN: Montey Hora Primary Care Janylah Belgrave: Deborra Medina Other Clinician: Referring Harshil Cavallaro: Deborra Medina Treating Jourden Gilson/Extender: Melburn Hake, HOYT Weeks in Treatment: 12 Vital Signs Time Taken: 09:11 Temperature (F): 98.1 Height (in): 68 Pulse (bpm): 64 Weight (lbs): 180 Respiratory Rate (breaths/min): 16 Body Mass Index (BMI): 27.4 Blood Pressure (mmHg): 144/43 Reference Range: 80 - 120 mg / dl Electronic Signature(s) Signed: 01/08/2018 1:06:46 PM By: Lorine Bears RCP, RRT, CHT Entered By: Lorine Bears on 01/08/2018 09:16:25

## 2018-01-17 DIAGNOSIS — I70203 Unspecified atherosclerosis of native arteries of extremities, bilateral legs: Secondary | ICD-10-CM | POA: Diagnosis not present

## 2018-01-17 DIAGNOSIS — L97513 Non-pressure chronic ulcer of other part of right foot with necrosis of muscle: Secondary | ICD-10-CM | POA: Diagnosis not present

## 2018-01-17 DIAGNOSIS — I10 Essential (primary) hypertension: Secondary | ICD-10-CM | POA: Diagnosis not present

## 2018-01-17 DIAGNOSIS — E11621 Type 2 diabetes mellitus with foot ulcer: Secondary | ICD-10-CM | POA: Diagnosis not present

## 2018-01-17 DIAGNOSIS — E1152 Type 2 diabetes mellitus with diabetic peripheral angiopathy with gangrene: Secondary | ICD-10-CM | POA: Diagnosis not present

## 2018-01-17 DIAGNOSIS — Z794 Long term (current) use of insulin: Secondary | ICD-10-CM | POA: Diagnosis not present

## 2018-01-19 DIAGNOSIS — E1152 Type 2 diabetes mellitus with diabetic peripheral angiopathy with gangrene: Secondary | ICD-10-CM | POA: Diagnosis not present

## 2018-01-19 DIAGNOSIS — I70203 Unspecified atherosclerosis of native arteries of extremities, bilateral legs: Secondary | ICD-10-CM | POA: Diagnosis not present

## 2018-01-19 DIAGNOSIS — L97513 Non-pressure chronic ulcer of other part of right foot with necrosis of muscle: Secondary | ICD-10-CM | POA: Diagnosis not present

## 2018-01-19 DIAGNOSIS — E11621 Type 2 diabetes mellitus with foot ulcer: Secondary | ICD-10-CM | POA: Diagnosis not present

## 2018-01-19 DIAGNOSIS — I10 Essential (primary) hypertension: Secondary | ICD-10-CM | POA: Diagnosis not present

## 2018-01-19 DIAGNOSIS — Z794 Long term (current) use of insulin: Secondary | ICD-10-CM | POA: Diagnosis not present

## 2018-01-21 ENCOUNTER — Ambulatory Visit (INDEPENDENT_AMBULATORY_CARE_PROVIDER_SITE_OTHER): Payer: Medicare Other | Admitting: Internal Medicine

## 2018-01-21 ENCOUNTER — Encounter: Payer: Self-pay | Admitting: Internal Medicine

## 2018-01-21 VITALS — BP 130/64 | HR 71 | Temp 97.9°F | Resp 15 | Ht 68.0 in | Wt 169.4 lb

## 2018-01-21 DIAGNOSIS — I10 Essential (primary) hypertension: Secondary | ICD-10-CM | POA: Diagnosis not present

## 2018-01-21 DIAGNOSIS — N183 Chronic kidney disease, stage 3 unspecified: Secondary | ICD-10-CM

## 2018-01-21 DIAGNOSIS — M79604 Pain in right leg: Secondary | ICD-10-CM

## 2018-01-21 DIAGNOSIS — G8929 Other chronic pain: Secondary | ICD-10-CM | POA: Diagnosis not present

## 2018-01-21 DIAGNOSIS — Z794 Long term (current) use of insulin: Secondary | ICD-10-CM | POA: Diagnosis not present

## 2018-01-21 DIAGNOSIS — I70203 Unspecified atherosclerosis of native arteries of extremities, bilateral legs: Secondary | ICD-10-CM | POA: Diagnosis not present

## 2018-01-21 DIAGNOSIS — I7025 Atherosclerosis of native arteries of other extremities with ulceration: Secondary | ICD-10-CM

## 2018-01-21 DIAGNOSIS — L97513 Non-pressure chronic ulcer of other part of right foot with necrosis of muscle: Secondary | ICD-10-CM | POA: Diagnosis not present

## 2018-01-21 DIAGNOSIS — M79605 Pain in left leg: Secondary | ICD-10-CM

## 2018-01-21 DIAGNOSIS — Z66 Do not resuscitate: Secondary | ICD-10-CM | POA: Diagnosis not present

## 2018-01-21 DIAGNOSIS — E1152 Type 2 diabetes mellitus with diabetic peripheral angiopathy with gangrene: Secondary | ICD-10-CM | POA: Diagnosis not present

## 2018-01-21 DIAGNOSIS — M5441 Lumbago with sciatica, right side: Secondary | ICD-10-CM | POA: Diagnosis not present

## 2018-01-21 DIAGNOSIS — M5442 Lumbago with sciatica, left side: Secondary | ICD-10-CM | POA: Diagnosis not present

## 2018-01-21 DIAGNOSIS — E11621 Type 2 diabetes mellitus with foot ulcer: Secondary | ICD-10-CM | POA: Diagnosis not present

## 2018-01-21 MED ORDER — HYDROCODONE-ACETAMINOPHEN 10-325 MG PO TABS: 1.0000 | ORAL_TABLET | Freq: Two times a day (BID) | ORAL | 0 refills | Status: AC | PRN

## 2018-01-21 NOTE — Patient Instructions (Addendum)
Steps for pain management:  Baclofen every 8 hours PLUS 2000 mg of acetominophen (tylenol) every day safely  In divided doses ( 1000 mg every 12 hours.)  You should not take advil or aleve   2)  STEP  2:  ADD  VICODIN AS NEEDED (NOT MORE THAN TWICE DAILY ) FOR SEVERE PAIN THAT IT IS EPISODIC   3) STEP 3:    If the pain becomes constant,  24/7,  PUT  ON THE FENTANYL PATCH WHICH LASTS FOR 3 DAYS .  YOU MUST PUT THE DATE ON THE PATCH TO KEEP TRACK OF WHEN YOU LAST CHANGED IT. YOU CAN USE VICODIN ON DAY 1 ,  BECAUSE THE PATCH WILL NOT BE IN FULL EFFECT FOR AT LEAST 12 HOURS     I WILL MAKE THE REFERRAL TO DR CHESNIS

## 2018-01-21 NOTE — Progress Notes (Signed)
Subjective:  Patient ID: [name], male    DOB: [birth year]  Age: 84 y.o. MRN: [id number]  CC: The primary encounter diagnosis was Chronic lower extremity pain (Primary Area of Pain) (Bilateral) (L>R). Diagnoses of Chronic bilateral low back pain with bilateral sciatica, CKD (chronic kidney disease) stage 3, GFR 30-59 ml/min (HCC), and Do not resuscitate were also pertinent to this visit.  HPI [name] presents for follow up on chronic lower extremity pain, presumed to be  multifactorial .  Last Seen on Dec 4 and baclofen plus fentanyl transdermal patches started . He had  reported that Lumbar spinal stenosis was suspected per Dr Dossie Arbour and patient had been referred for an MRI that had to be done at [facility] bc of his pacemaker. Based on today's visit,  An MRI is no longer planned,  . Now getting a CT scan on  Dec 27).  He also Saw Neurology on Dec 4  for follow up on neuropathic pain: EMG no longer planned or advised  And lumbar MRI  also not advised.  Recommended pain management as initiated by me.   The baclofen has relieved the leg cramps he was getting .  He is taking it three times daily.  He continues to take Advil prn for pain in feet despite his known kidney disease>  Th advils is not helping.  Has not been taking tylenol and has not tried fentanyl yet.    EDEMA HAS IMPROVED WITH TORSEMIDE   End of life discussion. He plans to refuse any future surgical interventions . Son is present for discussion. Discussed the role of Hospice when he is at that point .    Outpatient Medications Prior to Visit  Medication Sig Dispense Refill  . acetaminophen (TYLENOL) 325 MG tablet Take 2 tablets (650 mg total) by mouth every 6 (six) hours as needed for mild pain (or Fever >/= 101).    Marland Kitchen amLODipine (NORVASC) 10 MG tablet TAKE 1 TABLET (10 MG TOTAL) BY MOUTH DAILY. (Patient taking differently: Take 10 mg by mouth every morning. ) 90 tablet 2  . aspirin 325 MG tablet Take 325 mg by mouth  every evening.     Marland Kitchen atorvastatin (LIPITOR) 20 MG tablet TAKE 1 TABLET (20 MG TOTAL) BY MOUTH DAILY. (Patient taking differently: Take 20 mg by mouth daily at 6 PM. ) 90 tablet 1  . baclofen (LIORESAL) 10 MG tablet Take 1 tablet (10 mg total) by mouth 3 (three) times daily. 90 each 0  . Calcium Carbonate-Vitamin D (CALCIUM + D) 600-200 MG-UNIT TABS Take 1 tablet by mouth 2 (two) times daily.      . clopidogrel (PLAVIX) 75 MG tablet TAKE 1 TABLET (75 MG TOTAL) BY MOUTH DAILY. 90 tablet 1  . cyanocobalamin (,VITAMIN B-12,) 1000 MCG/ML injection INJECT 1 ML (1,000 MCG TOTAL) INTO THE MUSCLE EVERY 30 (THIRTY) DAYS. 3 mL 3  . fenofibrate (TRICOR) 48 MG tablet TAKE 1 TABLET BY MOUTH DAILY (Patient taking differently: Take 48 mg by mouth every morning. ) 90 tablet 1  . glucose blood (ACCU-CHEK AVIVA PLUS) test strip CHECK BLOOD SUGAR 3 TIMES A DAY AS NEEDED DX E11.51 300 each 5  . insulin aspart protamine - aspart (NOVOLOG MIX 70/30 FLEXPEN) (70-30) 100 UNIT/ML FlexPen INJECT 18 units two times daily , adjust dose using sliding scale 75 pen 1  . Insulin Pen Needle (B-D ULTRAFINE III SHORT PEN) 31G X 8 MM MISC USE AS DIRECTED 3 TIMES A DAY  300 each 5  . Insulin Syringe-Needle U-100 (B-D INS SYRINGE 0.5CC/31GX5/16) 31G X 5/16" 0.5 ML MISC 1 application by Does not apply route 3 (three) times daily before meals. 300 each 3  . latanoprost (XALATAN) 0.005 % ophthalmic solution Place 1 drop into both eyes at bedtime.  3  . losartan (COZAAR) 100 MG tablet TAKE 1 TABLET BY MOUTH EVERY DAY (Patient taking differently: Take 100 mg by mouth every morning. ) 90 tablet 1  . metoprolol tartrate (LOPRESSOR) 100 MG tablet TAKE 1 TABLET BY MOUTH TWICE A DAY 180 tablet 1  . Multiple Vitamin (MULTIVITAMIN) tablet Take 1 tablet by mouth every evening.     . polyethylene glycol (MIRALAX / GLYCOLAX) packet Take 17 g by mouth daily as needed for mild constipation. 14 each 0  . protein supplement shake (PREMIER PROTEIN) LIQD Take  325 mLs (11 oz total) by mouth 2 (two) times daily between meals. 60 Can 0  . Syringe, Disposable, 3 ML MISC For use with B12 injections weekly/monthly 25 each 0  . SYRINGE-NEEDLE, DISP, 3 ML (B-D 3CC LUER-LOK SYR 25GX1") 25G X 1" 3 ML MISC FOR USE WITH B12 INJECTIONS WEEKLY/MONTHLY 50 each 1  . torsemide (DEMADEX) 20 MG tablet Take 1 tablet (20 mg total) by mouth daily. 30 tablet 1  . collagenase (SANTYL) ointment Apply to wound twice a day (Patient not taking: Reported on 01/21/2018) 30 g 1  . silver sulfADIAZINE (SILVADENE) 1 % cream Apply 1 application topically daily. Apply amputation site and small toe daily (Patient not taking: Reported on 01/21/2018) 50 g 0  . furosemide (LASIX) 20 MG tablet Take 2 tablets (40 mg total) by mouth daily. AS NEEDED FOR FLUID RETENTION (Patient not taking: Reported on 01/12/2018) 180 tablet 1   No facility-administered medications prior to visit.     Review of Systems;  Patient denies headache, fevers, malaise, unintentional weight loss, skin rash, eye pain, sinus congestion and sinus pain, sore throat, dysphagia,  hemoptysis , cough, dyspnea, wheezing, chest pain, palpitations, orthopnea, edema, abdominal pain, nausea, melena, diarrhea, constipation, flank pain, dysuria, hematuria, urinary  Frequency, nocturia, numbness, tingling, seizures,  Focal weakness, Loss of consciousness,  Tremor, insomnia, depression, anxiety, and suicidal ideation.      Objective:  BP 130/64 (BP Location: Left Arm, Patient Position: Sitting, Cuff Size: Normal)   Pulse 71   Temp 97.9 F (36.6 C) (Oral)   Resp 15   Ht 5\' 8"  (1.727 m)   Wt 169 lb 6.4 oz (76.8 kg)   SpO2 98%   BMI 25.76 kg/m   BP Readings from Last 3 Encounters:  01/21/18 130/64  01/12/18 (!) 150/57  01/07/18 136/62    Wt Readings from Last 3 Encounters:  01/21/18 169 lb 6.4 oz (76.8 kg)  01/12/18 172 lb (78 kg)  01/07/18 181 lb (82.1 kg)    General appearance: alert, cooperative and appears stated  age Ears: normal TM's and external ear canals both ears Throat: lips, mucosa, and tongue normal; teeth and gums normal Neck: no adenopathy, no carotid bruit, supple, symmetrical, trachea midline and thyroid not enlarged, symmetric, no tenderness/mass/nodules Back: symmetric, no curvature. ROM normal. No CVA tenderness. Lungs: clear to auscultation bilaterally Heart: regular rate and rhythm, S1, S2 normal, no murmur, click, rub or gallop Abdomen: soft, non-tender; bowel sounds normal; no masses,  no organomegaly Pulses: 2+ and symmetric Skin: Skin color, texture, turgor normal. No rashes or lesions Lymph nodes: Cervical, supraclavicular, and axillary nodes normal.  Lab Results  Component Value Date   HGBA1C 7.2 (H) 11/07/2017   HGBA1C 6.8 (H) 07/09/2017   HGBA1C 7.5 (H) 04/07/2017    Lab Results  Component Value Date   CREATININE 1.98 (H) 01/21/2018   CREATININE 2.06 (H) 01/07/2018   CREATININE 1.94 (H) 11/10/2017    Lab Results  Component Value Date   WBC 7.2 11/10/2017   HGB 10.0 (L) 11/10/2017   HCT 29.6 (L) 11/10/2017   PLT 291 11/10/2017   GLUCOSE 295 (H) 01/21/2018   CHOL 102 04/07/2017   TRIG 119.0 04/07/2017   HDL 34.00 (L) 04/07/2017   LDLDIRECT 49.0 06/26/2015   LDLCALC 44 04/07/2017   ALT 13 11/07/2017   AST 16 11/07/2017   NA 142 01/21/2018   K 4.3 01/21/2018   CL 108 01/21/2018   CREATININE 1.98 (H) 01/21/2018   BUN 50 (H) 01/21/2018   CO2 23 01/21/2018   TSH 1.49 06/13/2014   INR 1.11 11/07/2017   HGBA1C 7.2 (H) 11/07/2017   MICROALBUR 3.1 (H) 04/07/2017    Dg C-arm 1-60 Min-no Report  Result Date: 12/25/2017 Fluoroscopy was utilized by the requesting physician.  No radiographic interpretation.    Assessment & Plan:   Problem List Items Addressed This Visit    Chronic lower extremity pain (Primary Area of Pain) (Bilateral) (L>R) - Primary (Chronic)    Pain management outlined as follows.  Continue baclofen and add tylenol for three times  daily use.  .  Add vicodin prn pain.  Then add fentanyl if pain becomes constant       Relevant Medications   HYDROcodone-acetaminophen (NORCO) 10-325 MG tablet   CKD (chronic kidney disease) stage 3, GFR 30-59 ml/min (HCC)    He has been taking torsemde daily for management of lower extremity edema'    Lab Results  Component Value Date   CREATININE 1.98 (H) 01/21/2018   Lab Results  Component Value Date   NA 142 01/21/2018   K 4.3 01/21/2018   CL 108 01/21/2018   CO2 23 01/21/2018         Relevant Orders   Basic metabolic panel (Completed)    Other Visit Diagnoses    Chronic bilateral low back pain with bilateral sciatica       Relevant Medications   HYDROcodone-acetaminophen (NORCO) 10-325 MG tablet   Other Relevant Orders   Ambulatory referral to Sports Medicine   Do not resuscitate       Relevant Orders   DNR (Do Not Resuscitate)    A total of 25 minutes of face to face time was spent with patient more than half of which was spent in counselling about the above mentioned conditions  and coordination of care  I have discontinued [name]. Rieger's furosemide. I am also having him start on HYDROcodone-acetaminophen. Additionally, I am having him maintain his aspirin, Calcium Carbonate-Vitamin D, latanoprost, Syringe (Disposable), Insulin Syringe-Needle U-100, SYRINGE-NEEDLE (DISP) 3 ML, Insulin Pen Needle, amLODipine, glucose blood, multivitamin, collagenase, cyanocobalamin, atorvastatin, clopidogrel, fenofibrate, losartan, acetaminophen, polyethylene glycol, protein supplement shake, insulin aspart protamine - aspart, silver sulfADIAZINE, metoprolol tartrate, torsemide, and baclofen.  Meds ordered this encounter  Medications  . HYDROcodone-acetaminophen (NORCO) 10-325 MG tablet    Sig: Take 1 tablet by mouth 2 (two) times daily as needed. FOR SEVERE PAIN    Dispense:  60 tablet    Refill:  0    Medications Discontinued During This Encounter  Medication Reason  .  furosemide (LASIX) 20 MG tablet Change in therapy  Follow-up: Return in about 6 weeks (around 03/04/2018).   Crecencio Mc, MD

## 2018-01-22 ENCOUNTER — Ambulatory Visit: Payer: Medicare Other | Admitting: Physician Assistant

## 2018-01-22 LAB — BASIC METABOLIC PANEL
BUN: 50 mg/dL — AB (ref 6–23)
CO2: 23 mEq/L (ref 19–32)
Calcium: 9 mg/dL (ref 8.4–10.5)
Chloride: 108 mEq/L (ref 96–112)
Creatinine, Ser: 1.98 mg/dL — ABNORMAL HIGH (ref 0.40–1.50)
GFR: 33.81 mL/min — AB (ref >60.00)
Glucose, Bld: 295 mg/dL — ABNORMAL HIGH (ref 70–99)
Potassium: 4.3 mEq/L (ref 3.5–5.1)
Sodium: 142 mEq/L (ref 135–145)

## 2018-01-23 ENCOUNTER — Ambulatory Visit
Admission: RE | Admit: 2018-01-23 | Discharge: 2018-01-23 | Disposition: A | Discharge status: Home / Self Care | Payer: Medicare Other | Source: Ambulatory Visit | Attending: Pain Medicine | Admitting: Pain Medicine

## 2018-01-23 DIAGNOSIS — G8929 Other chronic pain: Secondary | ICD-10-CM | POA: Diagnosis not present

## 2018-01-23 DIAGNOSIS — M47816 Spondylosis without myelopathy or radiculopathy, lumbar region: Secondary | ICD-10-CM | POA: Diagnosis not present

## 2018-01-23 DIAGNOSIS — M79605 Pain in left leg: Secondary | ICD-10-CM | POA: Diagnosis not present

## 2018-01-23 DIAGNOSIS — M79604 Pain in right leg: Secondary | ICD-10-CM | POA: Diagnosis not present

## 2018-01-23 DIAGNOSIS — M5136 Other intervertebral disc degeneration, lumbar region: Secondary | ICD-10-CM | POA: Diagnosis not present

## 2018-01-23 DIAGNOSIS — M48062 Spinal stenosis, lumbar region with neurogenic claudication: Secondary | ICD-10-CM | POA: Insufficient documentation

## 2018-01-23 DIAGNOSIS — M5126 Other intervertebral disc displacement, lumbar region: Secondary | ICD-10-CM | POA: Diagnosis not present

## 2018-01-24 ENCOUNTER — Encounter: Payer: Self-pay | Admitting: Internal Medicine

## 2018-01-24 DIAGNOSIS — I70203 Unspecified atherosclerosis of native arteries of extremities, bilateral legs: Secondary | ICD-10-CM | POA: Diagnosis not present

## 2018-01-24 DIAGNOSIS — L97513 Non-pressure chronic ulcer of other part of right foot with necrosis of muscle: Secondary | ICD-10-CM | POA: Diagnosis not present

## 2018-01-24 DIAGNOSIS — E11621 Type 2 diabetes mellitus with foot ulcer: Secondary | ICD-10-CM | POA: Diagnosis not present

## 2018-01-24 DIAGNOSIS — E1152 Type 2 diabetes mellitus with diabetic peripheral angiopathy with gangrene: Secondary | ICD-10-CM | POA: Diagnosis not present

## 2018-01-24 DIAGNOSIS — I10 Essential (primary) hypertension: Secondary | ICD-10-CM | POA: Diagnosis not present

## 2018-01-24 DIAGNOSIS — Z794 Long term (current) use of insulin: Secondary | ICD-10-CM | POA: Diagnosis not present

## 2018-01-24 NOTE — Assessment & Plan Note (Signed)
He has been taking torsemde daily for management of lower extremity edema'    Lab Results  Component Value Date   CREATININE 1.98 (H) 01/21/2018   Lab Results  Component Value Date   NA 142 01/21/2018   K 4.3 01/21/2018   CL 108 01/21/2018   CO2 23 01/21/2018

## 2018-01-24 NOTE — Assessment & Plan Note (Signed)
Pain management outlined as follows.  Continue baclofen and add tylenol for three times daily use.  .  Add vicodin prn pain.  Then add fentanyl if pain becomes constant

## 2018-01-26 DIAGNOSIS — I70203 Unspecified atherosclerosis of native arteries of extremities, bilateral legs: Secondary | ICD-10-CM | POA: Diagnosis not present

## 2018-01-26 DIAGNOSIS — E11621 Type 2 diabetes mellitus with foot ulcer: Secondary | ICD-10-CM | POA: Diagnosis not present

## 2018-01-26 DIAGNOSIS — Z794 Long term (current) use of insulin: Secondary | ICD-10-CM | POA: Diagnosis not present

## 2018-01-26 DIAGNOSIS — E1152 Type 2 diabetes mellitus with diabetic peripheral angiopathy with gangrene: Secondary | ICD-10-CM | POA: Diagnosis not present

## 2018-01-26 DIAGNOSIS — I10 Essential (primary) hypertension: Secondary | ICD-10-CM | POA: Diagnosis not present

## 2018-01-26 DIAGNOSIS — L97513 Non-pressure chronic ulcer of other part of right foot with necrosis of muscle: Secondary | ICD-10-CM | POA: Diagnosis not present

## 2018-01-29 ENCOUNTER — Other Ambulatory Visit: Payer: Self-pay | Admitting: Internal Medicine

## 2018-01-29 ENCOUNTER — Ambulatory Visit: Payer: Self-pay

## 2018-01-29 DIAGNOSIS — H04123 Dry eye syndrome of bilateral lacrimal glands: Secondary | ICD-10-CM | POA: Diagnosis not present

## 2018-01-29 DIAGNOSIS — L97513 Non-pressure chronic ulcer of other part of right foot with necrosis of muscle: Secondary | ICD-10-CM | POA: Diagnosis not present

## 2018-01-29 DIAGNOSIS — I10 Essential (primary) hypertension: Secondary | ICD-10-CM | POA: Diagnosis not present

## 2018-01-29 DIAGNOSIS — E11621 Type 2 diabetes mellitus with foot ulcer: Secondary | ICD-10-CM | POA: Diagnosis not present

## 2018-01-29 DIAGNOSIS — I70203 Unspecified atherosclerosis of native arteries of extremities, bilateral legs: Secondary | ICD-10-CM | POA: Diagnosis not present

## 2018-01-29 DIAGNOSIS — Z794 Long term (current) use of insulin: Secondary | ICD-10-CM | POA: Diagnosis not present

## 2018-01-29 DIAGNOSIS — E1152 Type 2 diabetes mellitus with diabetic peripheral angiopathy with gangrene: Secondary | ICD-10-CM | POA: Diagnosis not present

## 2018-01-29 NOTE — Telephone Encounter (Signed)
FYI

## 2018-01-29 NOTE — Telephone Encounter (Signed)
Call placed to [facility] Nurse with [facility].  She states that patient reports his pain at 8 today prior to dressing changes to both feet.  Danae Chen wants to notify the Dr that the patient  is non compliant with his pain medications. Pt encouraged by Danae Chen to take the medications. Son was present.   Attempted to contact pt to triage pt pain now that dressing changes have been completed Left VM to call ofice

## 2018-01-30 ENCOUNTER — Telehealth: Payer: Self-pay | Admitting: Internal Medicine

## 2018-01-30 ENCOUNTER — Encounter: Payer: Medicare Other | Admitting: Family Medicine

## 2018-01-30 ENCOUNTER — Telehealth: Payer: Self-pay

## 2018-01-30 DIAGNOSIS — I132 Hypertensive heart and chronic kidney disease with heart failure and with stage 5 chronic kidney disease, or end stage renal disease: Secondary | ICD-10-CM | POA: Diagnosis not present

## 2018-01-30 DIAGNOSIS — L97512 Non-pressure chronic ulcer of other part of right foot with fat layer exposed: Secondary | ICD-10-CM | POA: Diagnosis not present

## 2018-01-30 DIAGNOSIS — L97529 Non-pressure chronic ulcer of other part of left foot with unspecified severity: Secondary | ICD-10-CM | POA: Diagnosis not present

## 2018-01-30 DIAGNOSIS — L97329 Non-pressure chronic ulcer of left ankle with unspecified severity: Secondary | ICD-10-CM | POA: Diagnosis not present

## 2018-01-30 DIAGNOSIS — I70235 Atherosclerosis of native arteries of right leg with ulceration of other part of foot: Secondary | ICD-10-CM | POA: Diagnosis not present

## 2018-01-30 DIAGNOSIS — T8189XA Other complications of procedures, not elsewhere classified, initial encounter: Secondary | ICD-10-CM | POA: Diagnosis not present

## 2018-01-30 DIAGNOSIS — L97519 Non-pressure chronic ulcer of other part of right foot with unspecified severity: Secondary | ICD-10-CM | POA: Diagnosis not present

## 2018-01-30 DIAGNOSIS — N186 End stage renal disease: Secondary | ICD-10-CM | POA: Diagnosis not present

## 2018-01-30 DIAGNOSIS — L97522 Non-pressure chronic ulcer of other part of left foot with fat layer exposed: Secondary | ICD-10-CM | POA: Diagnosis not present

## 2018-01-30 DIAGNOSIS — E11621 Type 2 diabetes mellitus with foot ulcer: Secondary | ICD-10-CM | POA: Diagnosis not present

## 2018-01-30 DIAGNOSIS — L97526 Non-pressure chronic ulcer of other part of left foot with bone involvement without evidence of necrosis: Secondary | ICD-10-CM | POA: Diagnosis not present

## 2018-01-30 DIAGNOSIS — E1122 Type 2 diabetes mellitus with diabetic chronic kidney disease: Secondary | ICD-10-CM | POA: Diagnosis not present

## 2018-01-30 NOTE — Telephone Encounter (Signed)
Thanks for letting me know.  He has written out instructions (so does son) on how to treat his pain so no further action is needed from me at this time

## 2018-01-30 NOTE — Telephone Encounter (Signed)
Copied from [place] [id number]. Topic: General - Call Back - No Documentation >> Jan 30, 2018  7:10 AM Conception Chancy, NT wrote: Reason for CRM: patient is calling and states he had missed call from the office on his phone. He would like the office to call back.

## 2018-01-30 NOTE — Telephone Encounter (Signed)
Pt daughter Deneise Lever  called and stated that she missed a call from [place] and would like a call back please advise [phone]

## 2018-01-30 NOTE — Telephone Encounter (Signed)
CT of lumbar spine suggested spinal stenosis at the L2-3 level and severe degenerative changes.  Family is anxious for the appt with Dr Loistine Chance because I have been unable to get his pain under control and Dr Wynona Canes would not use epidural steroids . Can you let family (daughter uses patient's mychart)

## 2018-01-30 NOTE — Telephone Encounter (Signed)
Please advise 

## 2018-01-30 NOTE — Telephone Encounter (Signed)
[name] please let patient and daughter know that the CT of lumbar spine suggested moderate spinal stenosis at the L2-3 level and severe degenerative changes at other levels.   [name],  Patient and family is anxious and inquiring about the appt with Dr Loistine Chance (referral made  December 18 AND CT was done Dec 20)  because I have been unable to get his pain under control and Dr Wynona Canes would not use epidural steroids . Can you let family (daughter uses patient's mychart)  The status?

## 2018-02-01 NOTE — Progress Notes (Signed)
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 with x-ray them or I really did not show any definitive osteomyelitis of the right foot. Nonetheless in regard to this toe I think that being that there is no evidence of osteomyelitis by imaging currently we need to attempt to get this to granulate over as fast as possible and to that end I am going to make some dressing changes. 11/27/17 on evaluation today patient actually appears to be doing about the same in regard to his wounds. There still bone exposed in regard to the right second toe distal ulcer. The left fifth toe ulcer is also still shown signs of slow healing. The patient also has a new area on the dorsal surface of his left foot which was present last week but I was just watching this area appear to be more of a skin irritation. At this point actually show signs of being somewhat more there slough covering it is more of the wound definitely. I still think this emanated from irritation to the skin by adhesive. Again we are not managing the amputation site at this time. That is still under the postop global which is being managed by vascular. The patient did have a second opinion at [facility] with a vascular specialist there and they agree that there's really nothing more extreme from a vascular standpoint to improve the patient's overall vascular status. 12/04/17 on evaluation today patient actually appears to be doing about the same in regard to his right second toe ulcer although there's a little bit more in the way of slough noted over the surface of the wound. Bone is still exposed. In regard to left foot he has a lot of maceration around the surgical site even affecting the second and fourth toes surrounding due to all the drainage. He has been putting Santyl on this area although he tells me that no dressing has  been applied to the site. The dorsal surface of the foot does seem to be doing in better unfortunately the fifth toe with the to now independently was removed does not appear to be healing to well in my pinion. Overall I'm very unsure of how things are really doing as far as the overall appearance of everything is concerned. I'm beginning to question whether or not these wounds really gonna be able to be healed through traditional wound care measures. 12/11/17 on evaluation today patient actually appears to be doing about the same in regard to his foot ulcers. Overall I really do not see a significant amount of improvement. He still has sinuses to some degree in the left foot in particular the right is a little better. I had last week spoken with [facility] in the care of his surgical site they do want to transfer to [place] as well this is the left third toe amputation. Nonetheless I did have a conversation with the patient today discussing options as far as treatment is concerned. He does not really want to consider any further amputation as he wants to try to maintain mobility as much as possible. For that reason we're gonna maintain more palliative wound care at this point trying to do the best we can as far as treating the ulcers with the knowledge that due to his blood flow that may be difficult to get any of these areas to heal. 12/26/17 on evaluation today patient actually presents for follow-up concerning his bilateral lower extremity ulcers. At this time it  does not appear that the patient has any additional overall worsening at the surgical site of the left third toe. With that being said he does have a lot of necrotic tissue overlying the surface of this region that I think a careful selective debridement would benefit. Obviously I'm not gonna recommend anything too aggressive at the site just due to his poor microvascular blood flow. Nonetheless if we have any chance of  getting this to heal I think some careful debridement is going to be necessary. He has several blister areas unfortunately due to his swelling that is occurring. We are avoiding compression stockings at this time being that with his poor microvascular blood flow the last time he had these on everything turned a very poor blue color. The area that I marked to watch out for the red spreading of his foot does not appear to have caused any issues nor spread any further than what I saw during his office visit. Overall I'm extremely pleased with how things have progressed on not concerned as much today as I have been in the past with my visits. His daughter Kathlee Nations is present during the evaluation today. 01/08/18 on evaluation today patient appears to be doing a little worse all around in regard to his wounds. The right foot ulcers both on the posterior aspect of his Achilles region as well as the dorsal surface his foot both appear to be very dry. There are some areas that are drying out on the left foot as well but to be honest in most cases this is something that we probably want to happen in that regard. Again we have throughout the past several weeks and months discuss the possibility of invitation versus not. It is possible that he might would have to even have a bilateral below knee amputation if it proceeded to that degree. Nonetheless the patient up to this point has not wanted to even go down that road I'm definitely in agreement with honoring his wishes although I discussed with him again today that if he decides he would like to talk to someone from a surgical standpoint regarding this I would be more than happy to make the referral for him. 01/15/18 on evaluation today patient appears to be doing a little better in my pinion compared to the last evaluation. Things seem to at least be stable currently. He does have an appointment on the 31st with Dr. dew. Nonetheless he is not really Pfannenstiel, SAYYID HAREWOOD. ([id number]) interested in any application surgeries at this point he wants to try to maintain function as much as possible. With that being said we've had multiple discussions about how things could go poorly rather quickly in light of his current condition. He is aware of this as is his family. His daughter is present during the evaluation today. 01/30/18 Seen today for multiple ulcers to bilateral feet. He is having more pain than last visit. Per pt's nurse aid, he has not been walking as much as usual and using his wheelchair. Exam is limited today as he is unable to tolerate any movement or touching of LE. Left foot Wounds have a fair amount of necrotic tissue with adherent slough. He wound benefit from surgical intervention. Discussed the risk of infection with Mr. Leonides Schanz and his daughter Deneise Lever. He declines any surgery. At this time during visit there is no acute infection. Patient History Information obtained from Patient, Caregiver. Social History Former smoker, Marital Status - Widowed, Alcohol Use - Rarely, Drug  Use - No History, Caffeine Use - Never. 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 Respiratory The patient has no complaints or symptoms. Cardiovascular The patient has no complaints or symptoms. Gastrointestinal The patient has no complaints or symptoms. Integumentary (Skin) Complains or has symptoms of Wounds - Bilater LE foot ulcers. Objective Constitutional appears in no acute distress. Vitals Time Taken: 10:57 AM, Height: 68 in, Weight: 180 lbs, BMI: 27.4, Temperature: 97.7 F, Pulse: 78 bpm, Respiratory Rate: 16 breaths/min, Blood Pressure: 130/44 mmHg. Respiratory Respiratory effort is easy and symmetric bilaterally. Rate is normal at rest and on room air.. Cardiovascular Heart rhythm  and rate regular, without murmur or gallop.. Gastrointestinal (GI) Abdomen is soft and non-distended without masses or tenderness.Marland Kitchen Cayson, [name] ([id number]) General Notes: Patient's wounds in general appear to be unchanged. Lightly removed callous build up over the right dorsal wound. This caused pain, however he was able to tolerate procedure. No bleeding during procedure. Integumentary (Hair, Skin) multiple open foot wounds. Wound #1 status is Open. Original cause of wound was Gradually Appeared. The wound is located on the Right Toe Second. The wound measures 0.9cm length x 0.5cm width x 0.2cm depth; 0.353cm^2 area and 0.071cm^3 volume. Wound #3 status is Open. Original cause of wound was Not Known. The wound is located on the Left,Lateral Toe Fifth. The wound measures 1cm length x 1.5cm width x 0.1cm depth; 1.178cm^2 area and 0.118cm^3 volume. Wound #4 status is Open. Original cause of wound was Trauma. The wound is located on the Left,Medial,Dorsal Foot. The wound measures 2cm length x 1.2cm width x 0.3cm depth; 1.885cm^2 area and 0.565cm^3 volume. Wound #5 status is Open. Original cause of wound was Surgical Injury. The wound is located on the Left Toe Third. The wound measures 3.2cm length x 1.4cm width x 0.4cm depth; 3.519cm^2 area and 1.407cm^3 volume. Wound #6 status is Open. Original cause of wound was Not Known. The wound is located on the Right,Dorsal Foot. The wound measures 3cm length x 2.8cm width x 0.1cm depth; 6.597cm^2 area and 0.66cm^3 volume. Wound #8 status is Open. Original cause of wound was Gradually Appeared. The wound is located on the Left,Medial Malleolus. The wound measures 1.2cm length x 0.9cm width x 0.1cm depth; 0.848cm^2 area and 0.085cm^3 volume. 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 There was a Chemical/Enzymatic/Mechanical debridement performed by Beather Arbour, FNP. With the following instrument(s): gauze after achieving pain control using Lidocaine. Agent used was Other. A time out was conducted at 11:32, prior to the start of the procedure. There was no bleeding. The procedure was tolerated well with a pain level of 8 throughout and a pain level of 8 following the procedure. Post Debridement Measurements: 2cm length x 1.2cm width x 0.3cm depth; 0.565cm^3 volume. Character of Wound/Ulcer Post Debridement is improved. Severity of Tissue Post Debridement is: Fat layer exposed.  Post procedure Diagnosis Wound #4: Same as Pre-Procedure Azevedo, Aryon S. ([id number]) Wound #6 Pre-procedure diagnosis of Wound #6 is an Arterial Insufficiency Ulcer located on the Right,Dorsal Foot .Severity of Tissue Pre Debridement is: Fat layer exposed. There was a Chemical/Enzymatic/Mechanical debridement performed by Beather Arbour, FNP. With the following instrument(s): gauze after achieving pain control using Lidocaine. Agent used was Other. A time out was conducted at 11:32, prior to the start of the procedure. There was no bleeding. The procedure was tolerated well with a pain level of 8 throughout and a pain level of 8 following the procedure. Post Debridement Measurements: 3cm length x 2.8cm width x 0.1cm depth; 0.66cm^3 volume. Character of Wound/Ulcer Post Debridement is improved. Severity of Tissue Post Debridement  is: Fat layer exposed. Post procedure Diagnosis Wound #6: Same as Pre-Procedure Plan Wound Cleansing: Wound #1 Right Toe Second: Clean wound with Normal Saline. Clean wound with Normal Saline. May Shower, gently pat wound dry prior to applying new dressing. May Shower, gently pat wound dry prior to applying new dressing. Wound #3 Left,Lateral Toe Fifth: Clean wound with Normal Saline. May Shower, gently pat wound dry prior to applying new dressing. Wound #4 Left,Medial,Dorsal Foot: Clean wound with Normal Saline. May Shower, gently pat wound dry prior to applying new dressing. Wound #5 Left Toe Third: Clean wound with Normal Saline. May Shower, gently pat wound dry prior to applying new dressing. Wound #6 Right,Dorsal Foot: Clean wound with Normal Saline. May Shower, gently pat wound dry prior to applying new dressing. Wound #8 Left,Medial Malleolus: Clean wound with Normal Saline. May Shower, gently pat wound dry prior to applying new dressing. Primary Wound Dressing: Wound #1 Right Toe Second: Hydrafera Blue Ready Transfer Wound #3 Left,Lateral Toe Fifth: Hydrafera Blue Ready Transfer Wound #4 Left,Medial,Dorsal Foot: Hydrafera Blue Ready Transfer Wound #5 Left Toe Third: Hydrafera Blue Ready Transfer Wound #6 Right,Dorsal Foot: Hydrogel Hydrafera Blue Ready Transfer Wound #8 Left,Medial Malleolus: Hydrafera Blue Ready Transfer Secondary Dressing: Wound #1 Right Toe Second: Other - Coverlet Wound #3 Left,Lateral Toe Fifth: Gauze and Kerlix/Conform Patel, Endi S. ([id number]) Wound #4 Left,Medial,Dorsal Foot: Gauze and Kerlix/Conform Wound #6 Right,Dorsal Foot: Gauze and Kerlix/Conform Dressing Change Frequency: Wound #1 Right Toe Second: Change Dressing Monday, Wednesday, Friday - HOMEHEALTH-Monday, Wednesday, Friday Wound #3 Left,Lateral Toe Fifth: Change Dressing Monday, Wednesday, Friday - HOMEHEALTH-Monday, Wednesday, Friday Other: - Apply Lidocaine 4% to  wounds 15-20 minutes prior to wound care, Monday Wednesday and Friday. Wound #4 Left,Medial,Dorsal Foot: Change dressing every day. - HHRN to change dressing three times weekly and [facility] will change dressing once and family will attempt to change dressing the other days Wound #6 Right,Dorsal Foot: Change Dressing Monday, Wednesday, Friday - HOMEHEALTH-Monday, Wednesday, Friday Follow-up Appointments: Wound #1 Right Toe Second: Return Appointment in 2 weeks. Wound #3 Left,Lateral Toe Fifth: Return Appointment in 2 weeks. Wound #4 Left,Medial,Dorsal Foot: Return Appointment in 2 weeks. Wound #5 Left Toe Third: Return Appointment in 2 weeks. Wound #6 Right,Dorsal Foot: Return Appointment in 2 weeks. Wound #8 Left,Medial Malleolus: Return Appointment in 2 weeks. Home Health: Wound #1 Right Toe Second: Continue Home Health Visits - [place]: Monday, Wednesday and Friday Home Health Nurse may visit PRN to address patient s wound care needs. FACE TO FACE ENCOUNTER: MEDICARE and MEDICAID PATIENTS: I certify that this patient is under my care and that I had a face-to-face encounter that meets the physician face-to-face encounter requirements with this patient on this date. The encounter with the  patient was in whole or in part for the following MEDICAL CONDITION: (primary reason for [place]) MEDICAL NECESSITY: I certify, that based on my findings, NURSING services are a medically necessary home health service. HOME BOUND STATUS: I certify that my clinical findings support that this patient is homebound (i.e., Due to illness or injury, pt requires aid of supportive devices such as crutches, cane, wheelchairs, walkers, the use of special transportation or the assistance of another person to leave their place of residence. There is a normal inability to leave the home and doing so requires considerable and taxing effort. Other absences are for medical reasons /  religious services and are infrequent or of short duration when for other reasons). If current dressing causes regression in wound condition, may D/C ordered dressing product/s and apply Normal Saline Moist Dressing daily until next [place] / Other MD appointment. [place] of regression in wound condition at [phone]. Please direct any NON-WOUND related issues/requests for orders to patient's Primary Care Physician Wound #3 Left,Lateral Toe Fifth: [place] Visits - [facility]: Monday, Wednesday and Friday Home Health Nurse may visit PRN to address patient s wound care needs. FACE TO FACE ENCOUNTER: MEDICARE and MEDICAID PATIENTS: I certify that this patient is under my care and that I had a face-to-face encounter that meets the physician face-to-face encounter requirements with this patient on this date. The encounter with the patient was in whole or in part for the following MEDICAL CONDITION: (primary reason for [place]) MEDICAL NECESSITY: I certify, that based on my findings, NURSING services are a medically necessary home health service. HOME BOUND STATUS: I certify that my clinical findings support that this patient is homebound (i.e., Due to illness or injury, pt requires aid of supportive devices such as crutches, cane, wheelchairs, walkers, the use of special transportation or the assistance of another person to leave their place of residence. There is a normal inability to leave the home and doing so requires considerable and taxing effort. Other absences are for medical reasons / religious services and are infrequent or of short duration when for other reasons). If current dressing causes regression in wound condition, may D/C ordered dressing product/s and apply Normal Saline Moist Dressing daily until next [place] / Other MD appointment. [place] of regression in wound condition at  [phone]. Glander, ARACELI COUFAL ([id number]) Please direct any NON-WOUND related issues/requests for orders to patient's Primary Care Physician Wound #4 Left,Medial,Dorsal Foot: [place] Visits - [facility]: Monday, Wednesday and Friday Home Health Nurse may visit PRN to address patient s wound care needs. FACE TO FACE ENCOUNTER: MEDICARE and MEDICAID PATIENTS: I certify that this patient is under my care and that I had a face-to-face encounter that meets the physician face-to-face encounter requirements with this patient on this date. The encounter with the patient was in whole or in part for the following MEDICAL CONDITION: (primary reason for [place]) MEDICAL NECESSITY: I certify, that based on my findings, NURSING services are a medically necessary home health service. HOME BOUND STATUS: I certify that my clinical findings support that this patient is homebound (i.e., Due to illness or injury, pt requires aid of supportive devices such as crutches, cane, wheelchairs, walkers, the use of special transportation or the assistance of another person to leave their place of residence. There is a normal inability to leave the home and doing so requires considerable and taxing effort. Other absences are for medical  reasons / religious services and are infrequent or of short duration when for other reasons). If current dressing causes regression in wound condition, may D/C ordered dressing product/s and apply Normal Saline Moist Dressing daily until next [place] / Other MD appointment. [place] of regression in wound condition at [phone]. Please direct any NON-WOUND related issues/requests for orders to patient's Primary Care Physician Wound #6 Right,Dorsal Foot: [place] Visits - [facility]: Monday, Wednesday and Friday Home Health Nurse may visit PRN to address patient s wound care needs. FACE TO FACE ENCOUNTER: MEDICARE and  MEDICAID PATIENTS: I certify that this patient is under my care and that I had a face-to-face encounter that meets the physician face-to-face encounter requirements with this patient on this date. The encounter with the patient was in whole or in part for the following MEDICAL CONDITION: (primary reason for [place]) MEDICAL NECESSITY: I certify, that based on my findings, NURSING services are a medically necessary home health service. HOME BOUND STATUS: I certify that my clinical findings support that this patient is homebound (i.e., Due to illness or injury, pt requires aid of supportive devices such as crutches, cane, wheelchairs, walkers, the use of special transportation or the assistance of another person to leave their place of residence. There is a normal inability to leave the home and doing so requires considerable and taxing effort. Other absences are for medical reasons / religious services and are infrequent or of short duration when for other reasons). If current dressing causes regression in wound condition, may D/C ordered dressing product/s and apply Normal Saline Moist Dressing daily until next [place] / Other MD appointment. [place] of regression in wound condition at [phone]. Please direct any NON-WOUND related issues/requests for orders to patient's Primary Care Physician Medications-please add to medication list.: Other: - Apply Lidocaine 4% to wounds 15-20 minutes prior to wound care, Monday Wednesday and Friday. The following medication(s) was prescribed: lidocaine topical 4 % cream 0 Apply cream topically 15-20 prior to wound care daily on Monday, Wednesday, and Friday for Diabetic Ulcer starting 02/02/2018 Electronic Signature(s) Signed: 02/01/2018 12:42:22 AM By: Beather Arbour FNP-C Entered By: Beather Arbour on 01/30/2018 14:08:21 Biasi, [name]  ([id number]) -------------------------------------------------------------------------------- ROS/PFSH Details Patient Name: Quizhpi, Branndon S. Date of Service: 01/30/2018 11:00 AM Medical Record Number: [id number] Patient Account Number: [id number] Date of Birth/Sex: Aug 08, 1924 (84 y.o. M) Treating RN: Harold Barban Primary Care Provider: Deborra Medina Other Clinician: Referring Provider: Deborra Medina Treating Provider/Extender: Beather Arbour Weeks in Treatment: 16 Information Obtained From Patient Caregiver Wound History Integumentary (Skin) Complaints and Symptoms: Positive for: Wounds - Bilater LE foot ulcers Medical History: Negative for: History of Burn; History of pressure wounds Constitutional Symptoms (General Health) Complaints and Symptoms: No Complaints or Symptoms Eyes Medical History: Positive for: Cataracts Negative for: Glaucoma; Optic Neuritis Ear/Nose/Mouth/Throat Medical History: Negative for: Chronic sinus problems/congestion; Middle ear problems Hematologic/Lymphatic Medical History: Positive for: Lymphedema Negative for: Anemia; Hemophilia; Human Immunodeficiency Virus; Sickle Cell Disease Respiratory Complaints and Symptoms: No Complaints or Symptoms Medical History: Negative for: Aspiration; Asthma; Chronic Obstructive Pulmonary Disease (COPD); Pneumothorax; Sleep Apnea; Tuberculosis Cardiovascular Complaints and Symptoms: No Complaints or Symptoms Medical History: Positive for: Coronary Artery Disease; Hypertension; Peripheral Venous Disease Nadeem, Agastya S. ([id number]) Negative for: Angina; Arrhythmia; Congestive Heart Failure; Deep Vein Thrombosis; Hypotension; Myocardial Infarction; Peripheral Arterial Disease; Phlebitis; Vasculitis Past Medical History Notes: pacemaker, 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 leg, stent left leg Gastrointestinal Complaints and Symptoms:  No Complaints or Symptoms Medical History: Negative for: Cirrhosis ;  Colitis; Crohnos; Hepatitis A; Hepatitis B; Hepatitis C Endocrine Medical History: Positive for: Type II Diabetes - since 1988 Negative for: Type I Diabetes Treated with: Insulin Blood sugar tested every day: Yes Tested : 3 times daily Blood sugar testing results: Breakfast: 114 Genitourinary Medical History: Positive for: End Stage Renal Disease Immunological Medical History: Negative for: Lupus Erythematosus; Raynaudos; Scleroderma Musculoskeletal Medical History: Positive for: Rheumatoid Arthritis Negative for: Gout; Osteoarthritis; Osteomyelitis Neurologic Medical History: Positive for: Neuropathy - feet and legs Negative for: Dementia; Quadriplegia; Paraplegia; Seizure Disorder Psychiatric Medical History: Negative for: Anorexia/bulimia; Confinement Anxiety HBO Extended History Items Eyes: Cataracts Immunizations Pneumococcal Vaccine: Received Pneumococcal Vaccination: No [place], KONNAR BEN ([id number]) Implantable Devices Family and Social History Former smoker; Marital Status - Widowed; Alcohol Use: Rarely; Drug Use: No History; Caffeine Use: Never; Financial Concerns: No; Food, Clothing or Shelter Needs: No; Support System Lacking: No; Transportation Concerns: No; Advanced Directives: Yes (Not Provided); Patient does not want information on Advanced Directives; Do not resuscitate: No; Living Will: Yes (Not Provided); Medical Power of Attorney: Yes (Not Provided) Physician Affirmation I have reviewed and agree with the above information. Electronic Signature(s) Signed: 01/30/2018 5:10:00 PM By: Harold Barban Signed: 02/01/2018 12:42:22 AM By: Beather Arbour FNP-C Entered By: Beather Arbour on 01/30/2018 13:49:46 Giron, [name] ([id number]) -------------------------------------------------------------------------------- SuperBill Details Patient Name: Eyerly, Willow S. Date of Service: 01/30/2018 Medical Record Number: [id number] Patient Account Number:  [id number] Date of Birth/Sex: Feb 22, 1924 (84 y.o. 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 N18.9 Chronic kidney disease, unspecified I73.9 Peripheral vascular disease, unspecified I26.41 Chronic systolic (congestive) heart failure I10 Essential (primary) hypertension Facility Procedures CPT4 Code: [id number] Description: [phone] - DEBRIDE W/O ANES NON SELECT ICD-10 Diagnosis Description E11.621 Type 2 diabetes mellitus with foot ulcer Modifier: Quantity: 1 CPT4 Code: [id number] Description: 58592 - DEBRIDE W/O ANES NON SELECT ICD-10 Diagnosis Description L97.512 Non-pressure chronic ulcer of other part of right foot with fat Modifier: layer exposed Quantity: 1 Electronic Signature(s) Signed: 02/01/2018 12:42:22 AM By: Beather Arbour FNP-C Entered By: Beather Arbour on 01/30/2018 14:09:05

## 2018-02-01 NOTE — Progress Notes (Signed)
[name] ([id number]) Visit Report for 01/30/2018 Arrival Information Details Patient Name: [name], [name]. Date of Service: 01/30/2018 11:00 AM Medical Record Number: [id number] Patient Account Number: [id number] Date of Birth/Sex: 01-06-1925 (84 y.o. M) Treating RN: Cornell Barman Primary Care Johanna Matto: Deborra Medina Other Clinician: Referring Cortavious Nix: Deborra Medina Treating Abdulaziz Toman/Extender: Oneida Arenas in Treatment: 62 Visit Information History Since Last Visit Added or deleted any medications: No Patient Arrived: Wheel Chair Any new allergies or adverse reactions: No Arrival Time: 10:56 Had a fall or experienced change in No Accompanied By: self activities of daily living that may affect Transfer Assistance: None risk of falls: Patient Identification Verified: Yes Signs or symptoms of abuse/neglect since last visito No Secondary Verification Process Completed: Yes Hospitalized since last visit: No Implantable device outside of the clinic excluding No cellular tissue based products placed in the center since last visit: Has Dressing in Place as Prescribed: Yes Pain Present Now: Yes Electronic Signature(s) Signed: 01/30/2018 4:53:08 PM By: Gretta Cool, BSN, RN, CWS, Kim RN, BSN Entered By: Gretta Cool, BSN, RN, CWS, Kim on 01/30/2018 10:56:23 Krauser, [name] ([id number]) -------------------------------------------------------------------------------- Encounter Discharge Information Details Patient Name: Gum, [name] Date of Service: 01/30/2018 11:00 AM Medical Record Number: [id number] Patient Account Number: [id number] Date of Birth/Sex: Apr 14, 1924 (84 y.o. M) Treating RN: Harold Barban Primary Care Jerita Wimbush: Deborra Medina Other Clinician: Referring Thao Vanover: Deborra Medina Treating Jashanti Clinkscale/Extender: Oneida Arenas in Treatment: 16 Encounter Discharge Information Items Post Procedure Vitals Discharge Condition: Stable Temperature (F):  97.7 Ambulatory Status: Wheelchair Pulse (bpm): 78 Discharge Destination: Home Respiratory Rate (breaths/min): 16 Transportation: Private Auto Blood Pressure (mmHg): 130/44 Accompanied By: caregiver Schedule Follow-up Appointment: Yes Clinical Summary of Care: Electronic Signature(s) Signed: 01/30/2018 5:10:00 PM By: Harold Barban Entered By: Harold Barban on 01/30/2018 11:58:31 Mcleroy, [name] ([id number]) -------------------------------------------------------------------------------- Lower Extremity Assessment Details Patient Name: Ringer, Kentavious S. Date of Service: 01/30/2018 11:00 AM Medical Record Number: [id number] Patient Account Number: [id number] Date of Birth/Sex: 11/29/1924 (84 y.o. M) Treating RN: Cornell Barman Primary Care Haywood Meinders: Deborra Medina Other Clinician: Referring Rhetta Cleek: Deborra Medina Treating Jodene Polyak/Extender: Beather Arbour Weeks in Treatment: 16 Edema Assessment Assessed: Shirlyn Goltz: No] Patrice Paradise: No] [Left: Edema] [Right: :] Calf Left: Right: Point of Measurement: 30 cm From Medial Instep 34.6 cm 34 cm Ankle Left: Right: Point of Measurement: 10 cm From Medial Instep 24.5 cm 24.6 cm Vascular Assessment Pulses: Dorsalis Pedis Palpable: [Left:No] [Right:No] Posterior Tibial Extremity colors, hair growth, and conditions: Extremity Color: [Left:Pale] [Right:Pale] Hair Growth on Extremity: [Left:No] [Right:No] Temperature of Extremity: [Left:Warm] [Right:Cool] Capillary Refill: [Left:> 3 seconds] [Right:> 3 seconds] Toe Nail Assessment Left: Right: Thick: No No Discolored: Yes Yes Deformed: Yes Yes Improper Length and Hygiene: No No Electronic Signature(s) Signed: 01/30/2018 4:53:08 PM By: Gretta Cool, BSN, RN, CWS, Kim RN, BSN Entered By: Gretta Cool, BSN, RN, CWS, Kim on 01/30/2018 11:10:22 Gainey, [name] ([id number]) -------------------------------------------------------------------------------- Multi Wound Chart Details Patient Name: [name],  [name] Date of Service: 01/30/2018 11:00 AM Medical Record Number: [id number] Patient Account Number: [id number] Date of Birth/Sex: 03/16/1924 (84 y.o. M) Treating RN: Harold Barban Primary Care Michella Detjen: Deborra Medina Other Clinician: Referring Mitcheal Sweetin: Deborra Medina Treating Hosey Burmester/Extender: Beather Arbour Weeks in Treatment: 16 Vital Signs Height(in): 68 Pulse(bpm): 78 Weight(lbs): 180 Blood Pressure(mmHg): 130/44 Body Mass Index(BMI): 27 Temperature(F): 97.7 Respiratory Rate 16 (breaths/min): Photos: [1:No Photos] [3:No Photos] [4:No Photos] Wound Location: [1:Right Toe Second] [3:Left, Lateral Toe Fifth] [4:Left, Medial, Dorsal Foot] Wounding Event: [1:Gradually Appeared] [3:Not Known] [4:Trauma] Primary Etiology: [1:Diabetic Wound/Ulcer of  the Lower Extremity] [3:Diabetic Wound/Ulcer of the Lower Extremity] [4:Diabetic Wound/Ulcer of the Lower Extremity] Secondary Etiology: [1:Arterial Insufficiency Ulcer] [3:Arterial Insufficiency Ulcer] [4:Arterial Insufficiency Ulcer] Date Acquired: [1:06/04/2017] [3:08/05/2017] [4:11/20/2017] Weeks of Treatment: [1:16] [3:10] [4:9] Wound Status: [1:Open] [3:Open] [4:Open] Pending Amputation on [1:Yes] [3:No] [4:No] Presentation: Measurements L x W x D [1:0.9x0.5x0.2] [3:1x1.5x0.1] [4:2x1.2x0.3] (cm) Area (cm) : [1:0.353] [3:1.178] [4:1.885] Volume (cm) : [1:0.071] [3:0.118] [4:0.565] % Reduction in Area: [1:70.00%] [3:-150.10%] [4:-242.70%] % Reduction in Volume: [1:69.90%] [3:-151.10%] [4:-413.60%] Classification: [1:Grade 2] [3:Grade 2] [4:Grade 2] Debridement: [1:N/A] [3:N/A] [4:Chemical/Enzymatic/Mechanical] Pre-procedure [1:N/A] [3:N/A] [4:11:32] Verification/Time Out Taken: Pain Control: [1:N/A] [3:N/A] [4:Lidocaine] Instrument: [1:N/A] [3:N/A] [4:Other(gauze)] Bleeding: [1:N/A] [3:N/A] [4:None] Procedural Pain: [1:N/A] [3:N/A] [4:8] Post Procedural Pain: [1:N/A] [3:N/A] [4:8] Debridement Treatment [1:N/A] [3:N/A]  [4:Procedure was tolerated well] Response: Post Debridement [1:N/A] [3:N/A] [4:2x1.2x0.3] Measurements L x W x D (cm) Post Debridement Volume: [1:N/A] [3:N/A] [4:0.565] (cm) Periwound Skin Texture: [1:No Abnormalities Noted] [3:No Abnormalities Noted] [4:No Abnormalities Noted] Periwound Skin Moisture: [1:No Abnormalities Noted] [3:No Abnormalities Noted] [4:No Abnormalities Noted] Periwound Skin Color: [1:No Abnormalities Noted] [3:No Abnormalities Noted] [4:No Abnormalities Noted] Tenderness on Palpation: [1:No] [3:No] [4:No] Procedures Performed: N/A N/A Debridement Wound Number: [id number]  Photos: No Photos No Photos No Photos Wound Location: Left Toe Third Right, Dorsal Foot Left, Medial Malleolus Wounding Event: Surgical Injury Not Known Gradually Appeared 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 Secondary Etiology: N/A N/A N/A Date Acquired: 11/18/2017 12/25/2017 12/29/2017 Weeks of Treatment: 7 5 3  Wound Status: Open Open Open Pending Amputation on No No No Presentation: Measurements L x W x D 3.2x1.4x0.4 3x2.8x0.1 1.2x0.9x0.1 (cm) Area (cm) : 3.519 6.597 0.848 Volume (cm) : 1.407 0.66 0.085 % Reduction in Area: -49.40% 66.30% 80.70% % Reduction in Volume: 25.40% 66.30% 80.70% Classification: Full Thickness Without Full Thickness Without Full Thickness Without Exposed Support Structures Exposed Support Structures Exposed Support Structures Debridement: N/A Chemical/Enzymatic/Mechanical N/A Pre-procedure N/A 11:32 N/A Verification/Time Out Taken: Pain Control: N/A Lidocaine N/A Instrument: N/A Other(gauze) N/A Bleeding: N/A None N/A Procedural Pain: N/A 8 N/A Post Procedural Pain: N/A 8 N/A Debridement Treatment N/A Procedure was tolerated well N/A Response: Post Debridement N/A 3x2.8x0.1 N/A Measurements L x W x D (cm) Post Debridement Volume: N/A 0.66 N/A (cm) Periwound Skin Texture: No  Abnormalities Noted No Abnormalities Noted No Abnormalities Noted Periwound Skin Moisture: No Abnormalities Noted No Abnormalities Noted No Abnormalities Noted Periwound Skin Color: No Abnormalities Noted No Abnormalities Noted No Abnormalities Noted Tenderness on Palpation: No No No Procedures Performed: N/A Debridement N/A Treatment Notes Wound #1 (Right Toe Second) Wound #3 (Left, Lateral Toe Fifth) Wound #4 (Left, Medial, Dorsal Foot) Wound #5 (Left Toe Third) Wound #6 (Right, Dorsal Foot) Kennington, Erhard S. ([id number]) Wound #8 (Left, Medial Malleolus) Electronic Signature(s) Signed: 02/01/2018 12:42:22 AM By: Beather Arbour FNP-C Entered By: Beather Arbour on 01/30/2018 13:18:23 Colon, [name] ([id number]) -------------------------------------------------------------------------------- Multi-Disciplinary Care Plan Details Patient Name: Goehring, Jacquan S. Date of Service: 01/30/2018 11:00 AM Medical Record Number: [id number] Patient Account Number: [id number] Date of Birth/Sex: 1924-11-26 (84 y.o. M) Treating RN: Harold Barban Primary Care Jisselle Poth: Deborra Medina Other Clinician: Referring Everton Bertha: Deborra Medina Treating Kariem Wolfson/Extender: Beather Arbour Weeks in Treatment: 72 Active Inactive Abuse / Safety / Falls / Self Care Management Nursing Diagnoses: Potential for falls Goals: Patient will remain injury free related to falls Date Initiated: 10/10/2017 Target Resolution Date: 01/02/2018 Goal Status: Active Interventions: Assess fall risk on admission and as needed Notes: Orientation to the  Wound Care Program Nursing Diagnoses: Knowledge deficit related to the wound healing center program Goals: Patient/caregiver will verbalize understanding of the [facility] Program Date Initiated: 10/10/2017 Target Resolution Date: 01/02/2018 Goal Status: Active Interventions: Provide education on orientation to the wound center Notes: Pain, Acute or  Chronic Nursing Diagnoses: Potential alteration in comfort, pain Goals: Patient/caregiver will verbalize adequate pain control between visits Date Initiated: 10/10/2017 Target Resolution Date: 01/02/2018 Goal Status: Active Interventions: Complete pain assessment as per visit requirements Viloria, RITTER HELSLEY ([id number]) Notes: Wound/Skin Impairment Nursing Diagnoses: Impaired tissue integrity Goals: Ulcer/skin breakdown will heal within 14 weeks Date Initiated: 10/10/2017 Target Resolution Date: 01/02/2018 Goal Status: Active Interventions: Assess patient/caregiver ability to obtain necessary supplies Assess patient/caregiver ability to perform ulcer/skin care regimen upon admission and as needed Assess ulceration(s) every visit Notes: Electronic Signature(s) Signed: 01/30/2018 5:10:00 PM By: Harold Barban Entered By: Harold Barban on 01/30/2018 11:22:42 Swager, [name] ([id number]) -------------------------------------------------------------------------------- Pain Assessment Details Patient Name: Vangilder, Jvion S. Date of Service: 01/30/2018 11:00 AM Medical Record Number: [id number] Patient Account Number: [id number] Date of Birth/Sex: 02-15-1924 (84 y.o. M) Treating RN: Cornell Barman Primary Care Latorie Montesano: Deborra Medina Other Clinician: Referring Wylder Macomber: Deborra Medina Treating Laddie Math/Extender: Beather Arbour Weeks in Treatment: 16 Active Problems Location of Pain Severity and Description of Pain Patient Has Paino Yes Site Locations Pain Location: Generalized Pain With Dressing Change: Yes Rate the pain. Current Pain Level: 3 Character of Pain Describe the Pain: Aching, Burning, Sharp Pain Management and Medication Current Pain Management: Electronic Signature(s) Signed: 01/30/2018 4:53:08 PM By: Gretta Cool, BSN, RN, CWS, Kim RN, BSN Entered By: Gretta Cool, BSN, RN, CWS, Kim on 01/30/2018 10:56:56 Weng, [name]  ([id number]) -------------------------------------------------------------------------------- Patient/Caregiver Education Details Patient Name: Mirelez, [name]. Date of Service: 01/30/2018 11:00 AM Medical Record Number: [id number] Patient Account Number: [id number] Date of Birth/Gender: 02/14/1924 (84 y.o. M) Treating RN: Harold Barban Primary Care Physician: Deborra Medina Other Clinician: Referring Physician: Deborra Medina Treating Physician/Extender: Oneida Arenas in Treatment: 16 Education Assessment Education Provided To: Patient Education Topics Provided Electronic Signature(s) Signed: 01/30/2018 5:10:00 PM By: Harold Barban Entered By: Harold Barban on 01/30/2018 11:58:36 Custer, [name] ([id number]) -------------------------------------------------------------------------------- Wound Assessment Details Patient Name: Demmon, Daivik S. Date of Service: 01/30/2018 11:00 AM Medical Record Number: [id number] Patient Account Number: [id number] Date of Birth/Sex: [birth year] (84 y.o. M) Treating RN: Cornell Barman Primary Care Ayesha Markwell: Deborra Medina Other Clinician: Referring Malaina Mortellaro: Deborra Medina Treating Rigby Leonhardt/Extender: Beather Arbour Weeks in Treatment: 16 Wound Status Wound Number: 1 Primary Etiology: Diabetic Wound/Ulcer of the Lower Extremity Wound Location: Right Toe Second Secondary Arterial Insufficiency Ulcer Wounding Event: Gradually Appeared Etiology: Date Acquired: 06/04/2017 Wound Status: Open Weeks Of Treatment: 16 Clustered Wound: No Pending Amputation On Presentation Photos Photo Uploaded By: Gretta Cool, BSN, RN, CWS, Kim on 01/30/2018 14:19:56 Wound Measurements Length: (cm) 0.9 Width: (cm) 0.5 Depth: (cm) 0.2 Area: (cm) 0.353 Volume: (cm) 0.071 % Reduction in Area: 70% % Reduction in Volume: 69.9% Wound Description Classification: Grade 2 Periwound Skin Texture Texture Color No Abnormalities Noted: No No Abnormalities Noted:  No Moisture No Abnormalities Noted: No Treatment Notes Wound #1 (Right Toe Second) Electronic Signature(s) Signed: 01/30/2018 4:53:08 PM By: Gretta Cool, BSN, RN, CWS, Kim RN, BSN Entered By: Gretta Cool, BSN, RN, CWS, Kim on 01/30/2018 11:03:35 Sturdy, [name] ([id number]) Rahm, [name] ([id number]) -------------------------------------------------------------------------------- Wound Assessment Details Patient Name: Alpern, Seymore S. Date of Service: 01/30/2018 11:00 AM Medical Record Number: [id number] Patient Account Number: [id number] Date of Birth/Sex: 1924-05-19 (84 y.o. M)  Treating RN: Cornell Barman Primary Care Jahsiah Carpenter: Deborra Medina Other Clinician: Referring Teandre Hamre: Deborra Medina Treating Vern Guerette/Extender: Beather Arbour Weeks in Treatment: 16 Wound Status Wound Number: 3 Primary Etiology: Diabetic Wound/Ulcer of the Lower Extremity Wound Location: Left, Lateral Toe Fifth Secondary Arterial Insufficiency Ulcer Wounding Event: Not Known Etiology: Date Acquired: 08/05/2017 Wound Status: Open Weeks Of Treatment: 10 Clustered Wound: No Photos Photo Uploaded By: Gretta Cool, BSN, RN, CWS, Kim on 01/30/2018 14:19:56 Wound Measurements Length: (cm) 1 Width: (cm) 1.5 Depth: (cm) 0.1 Area: (cm) 1.178 Volume: (cm) 0.118 % Reduction in Area: -150.1% % Reduction in Volume: -151.1% Wound Description Classification: Grade 2 Periwound Skin Texture Texture Color No Abnormalities Noted: No No Abnormalities Noted: No Moisture No Abnormalities Noted: No Treatment Notes Wound #3 (Left, Lateral Toe Fifth) Electronic Signature(s) Signed: 01/30/2018 4:53:08 PM By: Gretta Cool, BSN, RN, CWS, Kim RN, BSN Entered By: Gretta Cool, BSN, RN, CWS, Kim on 01/30/2018 11:07:48 Hemsley, [name] ([id number]) Alvi, [name] ([id number]) -------------------------------------------------------------------------------- Wound Assessment Details Patient Name: Pizzo, Kalven S. Date of Service:  01/30/2018 11:00 AM Medical Record Number: [id number] Patient Account Number: [id number] Date of Birth/Sex: [birth year] (84 y.o. M) Treating RN: Cornell Barman Primary Care Dallis Darden: Deborra Medina Other Clinician: Referring Aamori Mcmasters: Deborra Medina Treating Kla Bily/Extender: Beather Arbour Weeks in Treatment: 16 Wound Status Wound Number: 4 Primary Etiology: Diabetic Wound/Ulcer of the Lower Extremity Wound Location: Left, Medial, Dorsal Foot Secondary Arterial Insufficiency Ulcer Wounding Event: Trauma Etiology: Date Acquired: 11/20/2017 Wound Status: Open Weeks Of Treatment: 9 Clustered Wound: No Photos Photo Uploaded By: Gretta Cool, BSN, RN, CWS, Kim on 01/30/2018 14:20:36 Wound Measurements Length: (cm) 2 Width: (cm) 1.2 Depth: (cm) 0.3 Area: (cm) 1.885 Volume: (cm) 0.565 % Reduction in Area: -242.7% % Reduction in Volume: -413.6% Wound Description Classification: Grade 2 Periwound Skin Texture Texture Color No Abnormalities Noted: No No Abnormalities Noted: No Moisture No Abnormalities Noted: No Treatment Notes Wound #4 (Left, Medial, Dorsal Foot) Electronic Signature(s) Signed: 01/30/2018 4:53:08 PM By: Gretta Cool, BSN, RN, CWS, Kim RN, BSN Entered By: Gretta Cool, BSN, RN, CWS, Kim on 01/30/2018 11:07:48 Filyaw, [name] ([id number]) Harnden, [name] ([id number]) -------------------------------------------------------------------------------- Wound Assessment Details Patient Name: Termini, Martese S. Date of Service: 01/30/2018 11:00 AM Medical Record Number: [id number] Patient Account Number: [id number] Date of Birth/Sex: 08/12/1924 (84 y.o. M) Treating RN: Cornell Barman Primary Care Elzada Pytel: Deborra Medina Other Clinician: Referring Tyan Lasure: Deborra Medina Treating Venola Castello/Extender: Beather Arbour Weeks in Treatment: 16 Wound Status Wound Number: 5 Primary Etiology: Arterial Insufficiency Ulcer Wound Location: Left Toe Third Wound Status: Open Wounding Event:  Surgical Injury Date Acquired: 11/18/2017 Weeks Of Treatment: 7 Clustered Wound: No Photos Photo Uploaded By: Gretta Cool, BSN, RN, CWS, Kim on 01/30/2018 14:20:36 Wound Measurements Length: (cm) 3.2 Width: (cm) 1.4 Depth: (cm) 0.4 Area: (cm) 3.519 Volume: (cm) 1.407 % Reduction in Area: -49.4% % Reduction in Volume: 25.4% Wound Description Full Thickness Without Exposed Support Classification: Structures Periwound Skin Texture Texture Color No Abnormalities Noted: No No Abnormalities Noted: No Moisture No Abnormalities Noted: No Treatment Notes Wound #5 (Left Toe Third) Electronic Signature(s) Signed: 01/30/2018 4:53:08 PM By: Gretta Cool, BSN, RN, CWS, Kim RN, BSN Entered By: Gretta Cool, BSN, RN, CWS, Kim on 01/30/2018 11:07:48 Rosasco, [name] ([id number]) Gibbs, [name] ([id number]) -------------------------------------------------------------------------------- Wound Assessment Details Patient Name: Derner, Xadrian S. Date of Service: 01/30/2018 11:00 AM Medical Record Number: [id number] Patient Account Number: [id number] Date of Birth/Sex: 1924-08-17 (84 y.o. M) Treating RN: Cornell Barman Primary Care Lory Nowaczyk: Deborra Medina Other Clinician: Referring Maebry Obrien: Deborra Medina Treating Media Pizzini/Extender:  Beather Arbour Weeks in Treatment: 16 Wound Status Wound Number: 6 Primary Etiology: Arterial Insufficiency Ulcer Wound Location: Right, Dorsal Foot Wound Status: Open Wounding Event: Not Known Date Acquired: 12/25/2017 Weeks Of Treatment: 5 Clustered Wound: No Photos Photo Uploaded By: Gretta Cool, BSN, RN, CWS, Kim on 01/30/2018 14:21:21 Wound Measurements Length: (cm) 3 Width: (cm) 2.8 Depth: (cm) 0.1 Area: (cm) 6.597 Volume: (cm) 0.66 % Reduction in Area: 66.3% % Reduction in Volume: 66.3% Wound Description Full Thickness Without Exposed Support Classification: Structures Periwound Skin Texture Texture Color No Abnormalities Noted: No No Abnormalities  Noted: No Moisture No Abnormalities Noted: No Treatment Notes Wound #6 (Right, Dorsal Foot) Electronic Signature(s) Signed: 01/30/2018 4:53:08 PM By: Gretta Cool, BSN, RN, CWS, Kim RN, BSN Entered By: Gretta Cool, BSN, RN, CWS, Kim on 01/30/2018 11:07:49 Schwering, [name] ([id number]) Hallberg, [name] ([id number]) -------------------------------------------------------------------------------- Wound Assessment Details Patient Name: Melle, Eugean S. Date of Service: 01/30/2018 11:00 AM Medical Record Number: [id number] Patient Account Number: [id number] Date of Birth/Sex: 1924/02/25 (84 y.o. M) Treating RN: Cornell Barman Primary Care Hamlin Devine: Deborra Medina Other Clinician: Referring Sapphire Tygart: Deborra Medina Treating Corinn Stoltzfus/Extender: Beather Arbour Weeks in Treatment: 16 Wound Status Wound Number: 8 Primary Etiology: Arterial Insufficiency Ulcer Wound Location: Left, Medial Malleolus Wound Status: Open Wounding Event: Gradually Appeared Date Acquired: 12/29/2017 Weeks Of Treatment: 3 Clustered Wound: No Photos Photo Uploaded By: Gretta Cool, BSN, RN, CWS, Kim on 01/30/2018 14:21:22 Wound Measurements Length: (cm) 1.2 Width: (cm) 0.9 Depth: (cm) 0.1 Area: (cm) 0.848 Volume: (cm) 0.085 % Reduction in Area: 80.7% % Reduction in Volume: 80.7% Wound Description Full Thickness Without Exposed Support Classification: Structures Periwound Skin Texture Texture Color No Abnormalities Noted: No No Abnormalities Noted: No Moisture No Abnormalities Noted: No Treatment Notes Wound #8 (Left, Medial Malleolus) Electronic Signature(s) Signed: 01/30/2018 4:53:08 PM By: Gretta Cool, BSN, RN, CWS, Kim RN, BSN Entered By: Gretta Cool, BSN, RN, CWS, Kim on 01/30/2018 11:07:49 Dubose, [name] ([id number]) Silverstein, [name] ([id number]) -------------------------------------------------------------------------------- Vitals Details Patient Name: Kos, Colbi S. Date of Service: 01/30/2018 11:00  AM Medical Record Number: [id number] Patient Account Number: [id number] Date of Birth/Sex: 1924-10-26 (84 y.o. M) Treating RN: Cornell Barman Primary Care Keiston Manley: Deborra Medina Other Clinician: Referring Kimori Tartaglia: Deborra Medina Treating Adlai Sinning/Extender: Beather Arbour Weeks in Treatment: 16 Vital Signs Time Taken: 10:57 Temperature (F): 97.7 Height (in): 68 Pulse (bpm): 78 Weight (lbs): 180 Respiratory Rate (breaths/min): 16 Body Mass Index (BMI): 27.4 Blood Pressure (mmHg): 130/44 Reference Range: 80 - 120 mg / dl Electronic Signature(s) Signed: 01/30/2018 4:53:08 PM By: Gretta Cool, BSN, RN, CWS, Kim RN, BSN Entered By: Gretta Cool, BSN, RN, CWS, Kim on 01/30/2018 10:57:15

## 2018-02-02 DIAGNOSIS — I70203 Unspecified atherosclerosis of native arteries of extremities, bilateral legs: Secondary | ICD-10-CM | POA: Diagnosis not present

## 2018-02-02 DIAGNOSIS — Z794 Long term (current) use of insulin: Secondary | ICD-10-CM | POA: Diagnosis not present

## 2018-02-02 DIAGNOSIS — I10 Essential (primary) hypertension: Secondary | ICD-10-CM | POA: Diagnosis not present

## 2018-02-02 DIAGNOSIS — L97513 Non-pressure chronic ulcer of other part of right foot with necrosis of muscle: Secondary | ICD-10-CM | POA: Diagnosis not present

## 2018-02-02 DIAGNOSIS — E1152 Type 2 diabetes mellitus with diabetic peripheral angiopathy with gangrene: Secondary | ICD-10-CM | POA: Diagnosis not present

## 2018-02-02 DIAGNOSIS — E11621 Type 2 diabetes mellitus with foot ulcer: Secondary | ICD-10-CM | POA: Diagnosis not present

## 2018-02-03 ENCOUNTER — Ambulatory Visit (INDEPENDENT_AMBULATORY_CARE_PROVIDER_SITE_OTHER): Payer: Medicare Other | Admitting: Vascular Surgery

## 2018-02-03 ENCOUNTER — Encounter (INDEPENDENT_AMBULATORY_CARE_PROVIDER_SITE_OTHER): Payer: Self-pay | Admitting: Vascular Surgery

## 2018-02-03 ENCOUNTER — Other Ambulatory Visit (INDEPENDENT_AMBULATORY_CARE_PROVIDER_SITE_OTHER): Payer: Self-pay | Admitting: Vascular Surgery

## 2018-02-03 ENCOUNTER — Encounter

## 2018-02-03 ENCOUNTER — Other Ambulatory Visit: Payer: Self-pay

## 2018-02-03 ENCOUNTER — Ambulatory Visit (INDEPENDENT_AMBULATORY_CARE_PROVIDER_SITE_OTHER): Payer: Medicare Other

## 2018-02-03 VITALS — BP 112/57 | HR 65 | Resp 14 | Ht 68.0 in | Wt 156.0 lb

## 2018-02-03 DIAGNOSIS — Z9862 Peripheral vascular angioplasty status: Secondary | ICD-10-CM

## 2018-02-03 DIAGNOSIS — E785 Hyperlipidemia, unspecified: Secondary | ICD-10-CM | POA: Diagnosis not present

## 2018-02-03 DIAGNOSIS — Z794 Long term (current) use of insulin: Secondary | ICD-10-CM

## 2018-02-03 DIAGNOSIS — E1121 Type 2 diabetes mellitus with diabetic nephropathy: Secondary | ICD-10-CM | POA: Diagnosis not present

## 2018-02-03 DIAGNOSIS — I739 Peripheral vascular disease, unspecified: Secondary | ICD-10-CM

## 2018-02-03 DIAGNOSIS — M79605 Pain in left leg: Secondary | ICD-10-CM

## 2018-02-03 DIAGNOSIS — M79604 Pain in right leg: Secondary | ICD-10-CM

## 2018-02-03 DIAGNOSIS — G8929 Other chronic pain: Secondary | ICD-10-CM

## 2018-02-03 DIAGNOSIS — I7025 Atherosclerosis of native arteries of other extremities with ulceration: Secondary | ICD-10-CM

## 2018-02-03 DIAGNOSIS — I10 Essential (primary) hypertension: Secondary | ICD-10-CM | POA: Diagnosis not present

## 2018-02-03 NOTE — Assessment & Plan Note (Signed)
This is likely a combination of neuropathy, chronic ulceration, and malperfusion particularly from the small vessels.

## 2018-02-03 NOTE — Progress Notes (Signed)
MRN : [id number]  [name] is a 84 y.o. (22-Apr-1924) male who presents with chief complaint of  Chief Complaint  Patient presents with  . atherosclerosis  .  History of Present Illness: Patient returns today in follow up of PAD and foot pain/ulcerations.  The addition of baclofen has significantly helped his lower extremity pain.  He is not having near the muscle spasms and other issues that he was having.  He continues to have nonhealing ulcerations.  He has decided that he will not have any further surgery/amputations and would not want dialysis if his kidneys fail.  When asked if he would want to have a procedure such as an angiogram if necessary, he is unsure of that at this time.  He does still have some pain in both feet with the left being the worse of the 2.  This is not constant but intermittent.  His noninvasive studies today showed noncompressible right lower extremity arteries with unreliable ABI of greater than 1.3.  He does have monophasic flow in all 3 tibial vessels.  On the left, the anterior tibial and posterior tibial arteries do not demonstrate flow but there is a decent monophasic waveform in the left peroneal artery which was his only known runoff previously.  Current Outpatient Medications  Medication Sig Dispense Refill  . acetaminophen (TYLENOL) 325 MG tablet Take 2 tablets (650 mg total) by mouth every 6 (six) hours as needed for mild pain (or Fever >/= 101).    Marland Kitchen amLODipine (NORVASC) 10 MG tablet TAKE 1 TABLET (10 MG TOTAL) BY MOUTH DAILY. (Patient taking differently: Take 10 mg by mouth every morning. ) 90 tablet 2  . aspirin 325 MG tablet Take 325 mg by mouth every evening.     Marland Kitchen atorvastatin (LIPITOR) 20 MG tablet TAKE 1 TABLET (20 MG TOTAL) BY MOUTH DAILY. (Patient taking differently: Take 20 mg by mouth daily at 6 PM. ) 90 tablet 1  . baclofen (LIORESAL) 10 MG tablet TAKE 1 TABLET BY MOUTH THREE TIMES A DAY 90 tablet 0  . Calcium Carbonate-Vitamin D  (CALCIUM + D) 600-200 MG-UNIT TABS Take 1 tablet by mouth 2 (two) times daily.      . clopidogrel (PLAVIX) 75 MG tablet TAKE 1 TABLET (75 MG TOTAL) BY MOUTH DAILY. 90 tablet 1  . collagenase (SANTYL) ointment Apply to wound twice a day 30 g 1  . cyanocobalamin (,VITAMIN B-12,) 1000 MCG/ML injection INJECT 1 ML (1,000 MCG TOTAL) INTO THE MUSCLE EVERY 30 (THIRTY) DAYS. 3 mL 3  . fenofibrate (TRICOR) 48 MG tablet TAKE 1 TABLET BY MOUTH DAILY (Patient taking differently: Take 48 mg by mouth every morning. ) 90 tablet 1  . glucose blood (ACCU-CHEK AVIVA PLUS) test strip CHECK BLOOD SUGAR 3 TIMES A DAY AS NEEDED DX E11.51 300 each 5  . HYDROcodone-acetaminophen (NORCO) 10-325 MG tablet Take 1 tablet by mouth 2 (two) times daily as needed. FOR SEVERE PAIN 60 tablet 0  . insulin aspart protamine - aspart (NOVOLOG MIX 70/30 FLEXPEN) (70-30) 100 UNIT/ML FlexPen INJECT 18 units two times daily , adjust dose using sliding scale 75 pen 1  . Insulin Pen Needle (B-D ULTRAFINE III SHORT PEN) 31G X 8 MM MISC USE AS DIRECTED 3 TIMES A DAY 300 each 5  . Insulin Syringe-Needle U-100 (B-D INS SYRINGE 0.5CC/31GX5/16) 31G X 5/16" 0.5 ML MISC 1 application by Does not apply [name] 3 (three) times daily before meals. 300 each 3  . latanoprost (  XALATAN) 0.005 % ophthalmic solution Place 1 drop into both eyes at bedtime.  3  . losartan (COZAAR) 100 MG tablet TAKE 1 TABLET BY MOUTH EVERY DAY (Patient taking differently: Take 100 mg by mouth every morning. ) 90 tablet 1  . metoprolol tartrate (LOPRESSOR) 100 MG tablet TAKE 1 TABLET BY MOUTH TWICE A DAY 180 tablet 1  . Multiple Vitamin (MULTIVITAMIN) tablet Take 1 tablet by mouth every evening.     . polyethylene glycol (MIRALAX / GLYCOLAX) packet Take 17 g by mouth daily as needed for mild constipation. 14 each 0  . protein supplement shake (PREMIER PROTEIN) LIQD Take 325 mLs (11 oz total) by mouth 2 (two) times daily between meals. 60 Can 0  . silver sulfADIAZINE (SILVADENE) 1 %  cream Apply 1 application topically daily. Apply amputation site and small toe daily 50 g 0  . Syringe, Disposable, 3 ML MISC For use with B12 injections weekly/monthly 25 each 0  . SYRINGE-NEEDLE, DISP, 3 ML (B-D 3CC LUER-LOK SYR 25GX1") 25G X 1" 3 ML MISC FOR USE WITH B12 INJECTIONS WEEKLY/MONTHLY 50 each 1  . torsemide (DEMADEX) 20 MG tablet TAKE 1 TABLET BY MOUTH EVERY DAY 30 tablet 1   No current facility-administered medications for this visit.     Past Medical History:  Diagnosis Date  . Arthritis   . Blood transfusion   . Cancer ([place])    skin cancer on forehead  . Coronary artery disease    h/o bypass coronary- 2001 & peripheral- 2011 , last full cardiac visit  in [place] , [place], [employer]  . Diabetes mellitus   . H/O: malaria 85   in [place]  . Hyperlipidemia   . Hypertension   . Peripheral artery disease ([place])    folowed by Eileen Kangas post bypass 2011 [place]  . Renal insufficiency    by Nov 2012 labs, no old records available    Past Surgical History:  Procedure Laterality Date  . AMPUTATION Left 10/29/2017   Procedure: AMPUTATION RAY ( THIRD TOE );  Surgeon: Algernon Huxley, MD;  Location: [facility] ORS;  Service: Vascular;  Laterality: Left;  . CORONARY ARTERY BYPASS GRAFT  2001   4 vessel[place] done in [place]   . FEMORAL BYPASS  2011   done in [place],  now followed by [facility]  . INSERT / REPLACE / REMOVE PACEMAKER     2004  . LOWER EXTREMITY ANGIOGRAPHY Left 08/18/2017   Procedure: LOWER EXTREMITY ANGIOGRAPHY;  Surgeon: Algernon Huxley, MD;  Location: [place] [facility];  Service: Cardiovascular;  Laterality: Left;  . LOWER EXTREMITY ANGIOGRAPHY Left 10/20/2017   Procedure: LOWER EXTREMITY ANGIOGRAPHY;  Surgeon: Algernon Huxley, MD;  Location: [place] [facility];  Service: Cardiovascular;  Laterality: Left;  . LOWER EXTREMITY ANGIOGRAPHY Right 10/27/2017   Procedure: LOWER EXTREMITY ANGIOGRAPHY;  Surgeon: Algernon Huxley, MD;  Location: [place] [facility];  Service:  Cardiovascular;  Laterality: Right;  . LOWER EXTREMITY ANGIOGRAPHY Left 11/10/2017   Procedure: Lower Extremity Angiography;  Surgeon: Algernon Huxley, MD;  Location: [place] [facility];  Service: Cardiovascular;  Laterality: Left;  . LUMBAR LAMINECTOMY/DECOMPRESSION MICRODISCECTOMY  01/16/2011   Procedure: LUMBAR LAMINECTOMY/DECOMPRESSION MICRODISCECTOMY;  Surgeon: Ophelia Charter;  Location: [place] [facility];  Service: Neurosurgery;  Laterality: N/A;  Lumbar four and lumbar five laminectomies   . PACEMAKER INSERTION    . SPINE SURGERY      Social History Social History   Tobacco Use  .  Smoking status: Former Smoker    Last attempt to quit: 09/27/1985    Years since quitting: 32.3  . Smokeless tobacco: Never Used  Substance Use Topics  . Alcohol use: No    Comment: occassional  . Drug use: No     Family History Family History  Problem Relation Age of Onset  . Mental illness Mother 59       alzheimers  . Peripheral vascular disease Father   . Diabetes Father   . Anesthesia problems Neg Hx   . Hypotension Neg Hx   . Malignant hyperthermia Neg Hx   . Pseudochol deficiency Neg Hx      Allergies  Allergen Reactions  . Hydrocodone-Acetaminophen Other (See Comments)    Hallucinations at higher doses  . Lyrica [Pregabalin] Other (See Comments)    dizziness  . Tramadol Other (See Comments)    Hallucinations with higher doses  . Trazodone And Nefazodone Other (See Comments)    tachycardia    Review of Systems: Negative Unless Checked Constitutional: '[]' ?Weight loss'[]' ?Fever'[]' ?Chills Cardiac:'[]' ?Chest pain'[]' ?Atrial Fibrillation'[]' ?Palpitations '[]' ?Shortness of breath when laying flat '[]' ?Shortness of breath with exertion. Vascular: '[x]' ?Pain in legs with walking'[]' ?Pain in legswith standing'[]' ?History of DVT '[]' ?Phlebitis '[]' ?Swelling in legs '[]' ?Varicose veins '[x]' ?Non-healing ulcers Pulmonary: '[]' ?Uses home oxygen '[]' ?Productive cough'[]' ?Hemoptysis  '[]' ?Wheeze '[]' ?COPD '[]' ?Asthma Neurologic: '[]' ?Dizziness'[]' ?Seizures '[]' ?History of stroke '[]' ?History of TIA'[]' ?Aphasia '[]' ?Vissual changes'[]' ?Weaknessor numbness in arm '[]' ?Weakness or numbnessin leg Musculoskeletal:'[]' ?Joint swelling '[]' ?Joint pain '[]' ?Low back pain '[]' ?History of Knee Replacement Hematologic:'[]' ?Easy bruising'[]' ?Easy bleeding '[]' ?Hypercoagulable state '[]' ?Anemic Gastrointestinal:'[]' ?Diarrhea '[]' ?Vomiting'[]' ?Gastroesophageal reflux/heartburn'[]' ?Difficulty swallowing. Genitourinary: '[x]' ?Chronic kidney disease '[]' ?Difficulturination '[]' ?Anuric'[]' ?Blood in urine Skin: '[]' ?Rashes '[]' ?Ulcers  Psychological: '[]' ?History of anxiety'[]' ?History of major depression '[]' ?Memory Difficulties   Physical Examination  BP (!) 112/57 (BP Location: Left Arm, Patient Position: Sitting)   Pulse 65   Resp 14   Ht '5\' 8"'  (1.727 m)   Wt 156 lb (70.8 kg)   BMI 23.72 kg/m  Gen:  WD/WN, NAD.  Appears younger than stated age Head: [place]/AT, No temporalis wasting. Ear/Nose/Throat: Hearing diminished, nares w/o erythema or drainage Eyes: Conjunctiva clear. Sclera non-icteric Neck: Supple.  Trachea midline Pulmonary:  Good air movement, no use of accessory muscles.  Cardiac: RRR, no JVD Vascular:  Vessel Right Left  Radial Palpable Palpable                          PT  not palpable  not palpable  DP  1+ palpable  not palpable    Musculoskeletal: M/S 5/5 throughout.  Uses a wheelchair.  1-2+ bilateral lower extremity edema.  Ulceration on the tip of the left great toe and the amputation site from the third toe.  Several ulcerations present on the right foot as well.  Neurologic: Sensation grossly intact in extremities.  Symmetrical.  Speech is fluent.  Psychiatric: Judgment intact, Mood & affect appropriate for pt's clinical situation. Dermatologic: Bilateral foot ulcerations as above       Labs Recent Results (from the past 2160 hour(s))  CBC  with Differential     Status: Abnormal   Collection Time: 11/07/17 12:06 PM  Result Value Ref Range   WBC 9.3 3.8 - 10.6 K/uL   RBC 3.15 (L) 4.40 - 5.90 MIL/uL   Hemoglobin 9.7 (L) 13.0 - 18.0 g/dL   HCT 30.5 (L) 40.0 - 52.0 %   MCV 96.6 80.0 - 100.0 fL   MCH 30.7 26.0 - 34.0 pg   MCHC 31.8 (L) 32.0 - 36.0 g/dL   RDW 15.2 (H) 11.5 -  14.5 %   Platelets 294 150 - 440 K/uL   Neutrophils Relative % 72 %   Neutro Abs 6.7 (H) 1.4 - 6.5 K/uL   Lymphocytes Relative 11 %   Lymphs Abs 1.0 1.0 - 3.6 K/uL   Monocytes Relative 8 %   Monocytes Absolute 0.8 0.2 - 1.0 K/uL   Eosinophils Relative 8 %   Eosinophils Absolute 0.7 0 - 0.7 K/uL   Basophils Relative 1 %   Basophils Absolute 0.1 0 - 0.1 K/uL    Comment: Performed at [facility], [address] [place], [place] [address]  Comprehensive metabolic panel     Status: Abnormal   Collection Time: 11/07/17 12:06 PM  Result Value Ref Range   Sodium 139 135 - 145 mmol/L   Potassium 5.3 (H) 3.5 - 5.1 mmol/L   Chloride 104 98 - 111 mmol/L   CO2 28 22 - 32 mmol/L   Glucose, Bld 132 (H) 70 - 99 mg/dL   BUN 48 (H) 8 - 23 mg/dL   Creatinine, Ser 1.76 (H) 0.61 - 1.24 mg/dL   Calcium 9.3 8.9 - 10.3 mg/dL   Total Protein 6.9 6.5 - 8.1 g/dL   Albumin 3.5 3.5 - 5.0 g/dL   AST 16 15 - 41 U/L   ALT 13 0 - 44 U/L   Alkaline Phosphatase 59 38 - 126 U/L   Total Bilirubin 0.4 0.3 - 1.2 mg/dL   GFR calc non Af Amer 32 (L) >60 mL/min   GFR calc Af Amer 37 (L) >60 mL/min    Comment: (NOTE) The eGFR has been calculated using the CKD EPI equation. This calculation has not been validated in all clinical situations. eGFR's persistently <60 mL/min signify possible Chronic Kidney Disease.    Anion gap 7 5 - 15    Comment: Performed at [facility], [address] [place], [place] [address]  Sedimentation rate     Status: Abnormal   Collection Time: 11/07/17 12:06 PM  Result Value Ref Range   Sed Rate 56 (H) 0 - 20 mm/hr    Comment:  Performed at [facility], [address] [place], [place] [address]  CK     Status: Abnormal   Collection Time: 11/07/17 12:06 PM  Result Value Ref Range   Total CK 30 (L) 49 - 397 U/L    Comment: Performed at [facility], [address] [place], [place] [address]  Hemoglobin A1c     Status: Abnormal   Collection Time: 11/07/17 12:06 PM  Result Value Ref Range   Hgb A1c MFr Bld 7.2 (H) 4.8 - 5.6 %    Comment: (NOTE)         Prediabetes: 5.7 - 6.4         Diabetes: >6.4         Glycemic control for adults with diabetes: <7.0    Mean Plasma Glucose 160 mg/dL    Comment: (NOTE) Performed At: [facility] [place], [place] [id number] Rush Farmer MD [id number]   APTT     Status: None   Collection Time: 11/07/17  3:36 PM  Result Value Ref Range   aPTT 32 24 - 36 seconds    Comment: Performed at [facility], [address] [place], [place] [address]  Protime-INR     Status: None   Collection Time: 11/07/17  3:36 PM  Result Value Ref Range   Prothrombin Time 14.2 11.4 - 15.2 seconds  INR 1.11     Comment: Performed at [facility], [address] [place], [place] [address]  CULTURE, BLOOD (ROUTINE X 2) w Reflex to ID Panel     Status: None   Collection Time: 11/07/17  3:48 PM  Result Value Ref Range   Specimen Description BLOOD L AC    Special Requests      BOTTLES DRAWN AEROBIC AND ANAEROBIC Blood Culture adequate volume   Culture      NO GROWTH 5 DAYS Performed at [facility], [address] [place], [place] [address]    Report Status 11/12/2017 FINAL   CULTURE, BLOOD (ROUTINE X 2) w Reflex to ID Panel     Status: None   Collection Time: 11/07/17  3:48 PM  Result Value Ref Range   Specimen Description BLOOD R HAND    Special Requests      BOTTLES DRAWN AEROBIC AND ANAEROBIC Blood Culture results may not be optimal due to an excessive volume of blood received in culture bottles   Culture       NO GROWTH 5 DAYS Performed at [facility], [address] [place], [place] [address]    Report Status 11/12/2017 FINAL   Glucose, capillary     Status: Abnormal   Collection Time: 11/07/17  4:46 PM  Result Value Ref Range   Glucose-Capillary 158 (H) 70 - 99 mg/dL  Glucose, capillary     Status: Abnormal   Collection Time: 11/07/17  9:09 PM  Result Value Ref Range   Glucose-Capillary 174 (H) 70 - 99 mg/dL  Heparin level (unfractionated)     Status: None   Collection Time: 11/08/17 12:53 AM  Result Value Ref Range   Heparin Unfractionated 0.47 0.30 - 0.70 IU/mL    Comment: (NOTE) If heparin results are below expected values, and patient dosage has  been confirmed, suggest follow up testing of antithrombin III levels. Performed at [facility], [address] [place], [place] [address]   CBC     Status: Abnormal   Collection Time: 11/08/17 12:53 AM  Result Value Ref Range   WBC 7.6 3.8 - 10.6 K/uL   RBC 2.77 (L) 4.40 - 5.90 MIL/uL   Hemoglobin 8.6 (L) 13.0 - 18.0 g/dL   HCT 26.3 (L) 40.0 - 52.0 %   MCV 94.8 80.0 - 100.0 fL   MCH 30.9 26.0 - 34.0 pg   MCHC 32.7 32.0 - 36.0 g/dL   RDW 14.8 (H) 11.5 - 14.5 %   Platelets 262 150 - 440 K/uL    Comment: Performed at [facility], [address] [place],  [address]  Basic metabolic panel     Status: Abnormal   Collection Time: 11/08/17 12:53 AM  Result Value Ref Range   Sodium 137 135 - 145 mmol/L   Potassium 4.3 3.5 - 5.1 mmol/L   Chloride 102 98 - 111 mmol/L   CO2 24 22 - 32 mmol/L   Glucose, Bld 282 (H) 70 - 99 mg/dL   BUN 48 (H) 8 - 23 mg/dL   Creatinine, Ser 2.07 (H) 0.61 - 1.24 mg/dL   Calcium 8.3 (L) 8.9 - 10.3 mg/dL   GFR calc non Af Amer 26 (L) >60 mL/min   GFR calc Af Amer 31 (L) >60 mL/min    Comment: (NOTE) The eGFR has been calculated using the CKD EPI equation. This calculation has not been validated in all clinical situations. eGFR's persistently <60 mL/min signify  possible  Chronic Kidney Disease.    Anion gap 11 5 - 15    Comment: Performed at [facility], [address] [place], [place] [address]  Magnesium     Status: None   Collection Time: 11/08/17 12:53 AM  Result Value Ref Range   Magnesium 1.8 1.7 - 2.4 mg/dL    Comment: Performed at [facility], [place], [place] [address]  Heparin level (unfractionated)     Status: None   Collection Time: 11/08/17  5:17 AM  Result Value Ref Range   Heparin Unfractionated 0.34 0.30 - 0.70 IU/mL    Comment: (NOTE) If heparin results are below expected values, and patient dosage has  been confirmed, suggest follow up testing of antithrombin III levels. Performed at [facility], [address] [place], [place] [address]   Glucose, capillary     Status: Abnormal   Collection Time: 11/08/17  8:06 AM  Result Value Ref Range   Glucose-Capillary 170 (H) 70 - 99 mg/dL   Comment 1 Notify RN   Glucose, capillary     Status: Abnormal   Collection Time: 11/08/17 11:57 AM  Result Value Ref Range   Glucose-Capillary 147 (H) 70 - 99 mg/dL   Comment 1 Notify RN   Glucose, capillary     Status: Abnormal   Collection Time: 11/08/17  4:35 PM  Result Value Ref Range   Glucose-Capillary 124 (H) 70 - 99 mg/dL  Glucose, capillary     Status: Abnormal   Collection Time: 11/08/17  9:43 PM  Result Value Ref Range   Glucose-Capillary 183 (H) 70 - 99 mg/dL  CBC     Status: Abnormal   Collection Time: 11/09/17  4:39 AM  Result Value Ref Range   WBC 9.3 3.8 - 10.6 K/uL   RBC 2.99 (L) 4.40 - 5.90 MIL/uL   Hemoglobin 9.4 (L) 13.0 - 18.0 g/dL   HCT 28.0 (L) 40.0 - 52.0 %   MCV 93.7 80.0 - 100.0 fL   MCH 31.5 26.0 - 34.0 pg   MCHC 33.7 32.0 - 36.0 g/dL   RDW 15.2 (H) 11.5 - 14.5 %   Platelets 276 150 - 440 K/uL    Comment: Performed at [facility], [address] [place], [place] [address]  Basic metabolic panel     Status: Abnormal   Collection Time:  11/09/17  4:39 AM  Result Value Ref Range   Sodium 137 135 - 145 mmol/L   Potassium 4.1 3.5 - 5.1 mmol/L   Chloride 103 98 - 111 mmol/L   CO2 25 22 - 32 mmol/L   Glucose, Bld 168 (H) 70 - 99 mg/dL   BUN 38 (H) 8 - 23 mg/dL   Creatinine, Ser 1.91 (H) 0.61 - 1.24 mg/dL   Calcium 8.7 (L) 8.9 - 10.3 mg/dL   GFR calc non Af Amer 29 (L) >60 mL/min   GFR calc Af Amer 34 (L) >60 mL/min    Comment: (NOTE) The eGFR has been calculated using the CKD EPI equation. This calculation has not been validated in all clinical situations. eGFR's persistently <60 mL/min signify possible Chronic Kidney Disease.    Anion gap 9 5 - 15    Comment: Performed at [facility], [address] [place], [place] [address]  Magnesium     Status: None   Collection Time: 11/09/17  4:39 AM  Result Value Ref Range   Magnesium 1.7 1.7 - 2.4 mg/dL  Comment: Performed at [facility], [place], [place] [address]  Heparin level (unfractionated)     Status: None   Collection Time: 11/09/17  4:39 AM  Result Value Ref Range   Heparin Unfractionated 0.48 0.30 - 0.70 IU/mL    Comment: (NOTE) If heparin results are below expected values, and patient dosage has  been confirmed, suggest follow up testing of antithrombin III levels. Performed at [facility], [address] [place], [place] [address]   Glucose, capillary     Status: Abnormal   Collection Time: 11/09/17  7:38 AM  Result Value Ref Range   Glucose-Capillary 153 (H) 70 - 99 mg/dL   Comment 1 Notify RN   Glucose, capillary     Status: Abnormal   Collection Time: 11/09/17 11:43 AM  Result Value Ref Range   Glucose-Capillary 183 (H) 70 - 99 mg/dL   Comment 1 Notify RN   Glucose, capillary     Status: Abnormal   Collection Time: 11/09/17  4:23 PM  Result Value Ref Range   Glucose-Capillary 134 (H) 70 - 99 mg/dL  Glucose, capillary     Status: None   Collection Time: 11/09/17  9:14 PM  Result Value Ref Range     Glucose-Capillary 89 70 - 99 mg/dL  CBC     Status: Abnormal   Collection Time: 11/10/17  4:02 AM  Result Value Ref Range   WBC 7.2 3.8 - 10.6 K/uL   RBC 3.13 (L) 4.40 - 5.90 MIL/uL   Hemoglobin 10.0 (L) 13.0 - 18.0 g/dL   HCT 29.6 (L) 40.0 - 52.0 %   MCV 94.7 80.0 - 100.0 fL   MCH 32.0 26.0 - 34.0 pg   MCHC 33.7 32.0 - 36.0 g/dL   RDW 14.8 (H) 11.5 - 14.5 %   Platelets 291 150 - 440 K/uL    Comment: Performed at [facility], [address] [place], [place] [address]  Basic metabolic panel     Status: Abnormal   Collection Time: 11/10/17  4:02 AM  Result Value Ref Range   Sodium 139 135 - 145 mmol/L   Potassium 4.0 3.5 - 5.1 mmol/L   Chloride 102 98 - 111 mmol/L   CO2 24 22 - 32 mmol/L   Glucose, Bld 124 (H) 70 - 99 mg/dL   BUN 32 (H) 8 - 23 mg/dL   Creatinine, Ser 1.94 (H) 0.61 - 1.24 mg/dL   Calcium 8.7 (L) 8.9 - 10.3 mg/dL   GFR calc non Af Amer 29 (L) >60 mL/min   GFR calc Af Amer 33 (L) >60 mL/min    Comment: (NOTE) The eGFR has been calculated using the CKD EPI equation. This calculation has not been validated in all clinical situations. eGFR's persistently <60 mL/min signify possible Chronic Kidney Disease.    Anion gap 13 5 - 15    Comment: Performed at [facility], [address] [place], [place] [address]  Magnesium     Status: None   Collection Time: 11/10/17  4:02 AM  Result Value Ref Range   Magnesium 1.7 1.7 - 2.4 mg/dL    Comment: Performed at [facility], [place], [place] [address]  Heparin level (unfractionated)     Status: None   Collection Time: 11/10/17  4:02 AM  Result Value Ref Range   Heparin Unfractionated 0.59 0.30 - 0.70 IU/mL    Comment: (NOTE) If heparin results are below expected values, and patient  dosage has  been confirmed, suggest follow up testing of antithrombin III levels. Performed at [facility], [address] [place], [place] [address]   Glucose, capillary      Status: Abnormal   Collection Time: 11/10/17  7:45 AM  Result Value Ref Range   Glucose-Capillary 119 (H) 70 - 99 mg/dL  Glucose, capillary     Status: Abnormal   Collection Time: 11/10/17 11:41 AM  Result Value Ref Range   Glucose-Capillary 157 (H) 70 - 99 mg/dL  Glucose, capillary     Status: Abnormal   Collection Time: 11/10/17  2:48 PM  Result Value Ref Range   Glucose-Capillary 118 (H) 70 - 99 mg/dL  Glucose, capillary     Status: None   Collection Time: 11/10/17  4:48 PM  Result Value Ref Range   Glucose-Capillary 97 70 - 99 mg/dL  Glucose, capillary     Status: Abnormal   Collection Time: 11/10/17  9:27 PM  Result Value Ref Range   Glucose-Capillary 188 (H) 70 - 99 mg/dL  Glucose, capillary     Status: Abnormal   Collection Time: 11/11/17  7:38 AM  Result Value Ref Range   Glucose-Capillary 156 (H) 70 - 99 mg/dL  Glucose, capillary     Status: Abnormal   Collection Time: 11/11/17 11:33 AM  Result Value Ref Range   Glucose-Capillary 195 (H) 70 - 99 mg/dL  Magnesium     Status: None   Collection Time: 01/07/18 11:18 AM  Result Value Ref Range   Magnesium 2.1 1.5 - 2.5 mg/dL  Basic metabolic panel     Status: Abnormal   Collection Time: 01/07/18 11:18 AM  Result Value Ref Range   Sodium 142 135 - 145 mEq/L   Potassium 4.8 3.5 - 5.1 mEq/L   Chloride 110 96 - 112 mEq/L   CO2 23 19 - 32 mEq/L   Glucose, Bld 190 (H) 70 - 99 mg/dL   BUN 51 (H) 6 - 23 mg/dL   Creatinine, Ser 2.06 (H) 0.40 - 1.50 mg/dL   Calcium 8.9 8.4 - 10.5 mg/dL   GFR 32.30 (L) >60.00 mL/min  Basic metabolic panel     Status: Abnormal   Collection Time: 01/21/18 11:50 AM  Result Value Ref Range   Sodium 142 135 - 145 mEq/L   Potassium 4.3 3.5 - 5.1 mEq/L   Chloride 108 96 - 112 mEq/L   CO2 23 19 - 32 mEq/L   Glucose, Bld 295 (H) 70 - 99 mg/dL   BUN 50 (H) 6 - 23 mg/dL   Creatinine, Ser 1.98 (H) 0.40 - 1.50 mg/dL   Calcium 9.0 8.4 - 10.5 mg/dL   GFR 33.81 (L) >60.00 mL/min    Radiology Ct  Lumbar Spine Wo Contrast  Result Date: 01/23/2018 CLINICAL DATA:  Chronic lower extremity pain, presumed to be multifactorial . Last Seen on Dec 4 and baclofen plus fentanyl transdermal patches started . He had reported that Lumbar spinal stenosis with suspected. EXAM: CT LUMBAR SPINE WITHOUT CONTRAST TECHNIQUE: Multidetector CT imaging of the lumbar spine was performed without intravenous contrast administration. Multiplanar CT image reconstructions were also generated. COMPARISON:  09/11/2010 FINDINGS: Segmentation: 5 lumbar type vertebrae. Alignment: Normal. Vertebrae: No acute fracture or focal pathologic process. Paraspinal and other soft tissues: No acute paraspinal abnormality. Abdominal aortic atherosclerosis. Infrarenal abdominal aortic ectasia measuring 2.6 cm. Disc levels: Degenerative disc disease with disc height loss at L5-S1. Anterior bridging osteophytes at L3-4 and L4-5. T12-L1: No disc protrusion.  No foraminal or central canal stenosis. Mild bilateral facet arthropathy. L1-L2: Mild broad-based disc bulge. Moderate bilateral facet arthropathy. No foraminal stenosis. L2-L3: Mild broad-based disc bulge. Moderate bilateral facet arthropathy. Bilateral lateral recess stenosis. Moderate spinal stenosis. Mild left foraminal stenosis. No right foraminal stenosis. L3-L4: Broad-based disc bulge. Severe bilateral facet arthropathy. Findings suspicious for spinal stenosis. L4-L5: Prior laminectomy. Broad-based disc bulge. Severe bilateral facet arthropathy. No foraminal stenosis. L5-S1: Prior laminectomy. Mild broad-based disc osteophyte complex. Severe bilateral facet arthropathy. Bilateral foraminal stenosis. IMPRESSION: 1. Lumbar spine spondylosis as described above. 2. At L2-3 there is a mild broad-based disc bulge. Moderate bilateral facet arthropathy and bilateral lateral recess stenosis. Findings suspicious for moderate spinal stenosis. 3. If there is persistent clinical concern regarding spinal  stenosis, recommend further evaluation with a CT myelogram for better characterization. 4. 2.6 cm ectatic abdominal aorta, at risk for aneurysm development. Recommend follow-up aortic ultrasound in 5 years. This recommendation follows ACR consensus guidelines: White Paper of the ACR Incidental Findings Committee II on Vascular Findings. J Am Coll Radiol 2013; 10:789-794. Electronically Signed   By: Kathreen Devoid   On: 01/23/2018 13:24    Assessment/Plan CKD (chronic kidney disease) stage 3, GFR 30-59 ml/min We have been very cognizant with each angiogram to try to limit the amount of contrast.    He has decided that if his kidneys fail, he would not have dialysis and understands this would be a fatal issue.  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 Also, involved in wound healing. On appropriate medications.   Hypertension blood pressure control important in reducing the progression of atherosclerotic disease. On appropriate oral medications.  Chronic lower extremity pain (Primary Area of Pain) (Bilateral) (L>R) This is likely a combination of neuropathy, chronic ulceration, and malperfusion particularly from the small vessels.  Atherosclerosis of native arteries of the extremities with ulceration (Avis) His noninvasive studies today showed noncompressible right lower extremity arteries with unreliable ABI of greater than 1.3.  He does have monophasic flow in all 3 tibial vessels.  On the left, the anterior tibial and posterior tibial arteries do not demonstrate flow but there is a decent monophasic waveform in the left peroneal artery which was his only known runoff previously. This point, I do not think that any intervention would likely be of any benefit.  I think the majority of the issues are small vessel disease.  I would not recommend any angiogram or other procedures at this time.   I will see him back in 3 months with noninvasive studies or sooner if problems develop in the interim.    Leotis Pain, MD  02/03/2018 1:13 PM    This note was created with Dragon medical transcription system.  Any errors from dictation are purely unintentional

## 2018-02-03 NOTE — Assessment & Plan Note (Signed)
His noninvasive studies today showed noncompressible right lower extremity arteries with unreliable ABI of greater than 1.3.  He does have monophasic flow in all 3 tibial vessels.  On the left, the anterior tibial and posterior tibial arteries do not demonstrate flow but there is a decent monophasic waveform in the left peroneal artery which was his only known runoff previously. This point, I do not think that any intervention would likely be of any benefit.  I think the majority of the issues are small vessel disease.  I would not recommend any angiogram or other procedures at this time.  I will see him back in 3 months with noninvasive studies or sooner if problems develop in the interim.

## 2018-02-05 DIAGNOSIS — H40013 Open angle with borderline findings, low risk, bilateral: Secondary | ICD-10-CM | POA: Diagnosis not present

## 2018-02-05 DIAGNOSIS — E1152 Type 2 diabetes mellitus with diabetic peripheral angiopathy with gangrene: Secondary | ICD-10-CM | POA: Diagnosis not present

## 2018-02-05 DIAGNOSIS — H04123 Dry eye syndrome of bilateral lacrimal glands: Secondary | ICD-10-CM | POA: Diagnosis not present

## 2018-02-05 DIAGNOSIS — E11621 Type 2 diabetes mellitus with foot ulcer: Secondary | ICD-10-CM | POA: Diagnosis not present

## 2018-02-05 DIAGNOSIS — Z794 Long term (current) use of insulin: Secondary | ICD-10-CM | POA: Diagnosis not present

## 2018-02-05 DIAGNOSIS — L97513 Non-pressure chronic ulcer of other part of right foot with necrosis of muscle: Secondary | ICD-10-CM | POA: Diagnosis not present

## 2018-02-05 DIAGNOSIS — I10 Essential (primary) hypertension: Secondary | ICD-10-CM | POA: Diagnosis not present

## 2018-02-05 DIAGNOSIS — I70203 Unspecified atherosclerosis of native arteries of extremities, bilateral legs: Secondary | ICD-10-CM | POA: Diagnosis not present

## 2018-02-05 DIAGNOSIS — Z961 Presence of intraocular lens: Secondary | ICD-10-CM | POA: Diagnosis not present

## 2018-02-06 ENCOUNTER — Telehealth: Payer: Self-pay | Admitting: *Deleted

## 2018-02-06 NOTE — Telephone Encounter (Signed)
Copied from [place] [phone]. Topic: Referral - Status >> Feb 06, 2018 11:23 AM [name], NT wrote: Reason for CRM: Pts daughter, [name] calling to check status on pts referral to Dr. Sharlet Salina. His office states they do not have the referral. She would also like to see if someone can speak with her dad to cut back on his baclofen as his family believe it is causing some muscle weakness and memory loss.

## 2018-02-08 ENCOUNTER — Other Ambulatory Visit: Payer: Self-pay | Admitting: Internal Medicine

## 2018-02-09 DIAGNOSIS — E11621 Type 2 diabetes mellitus with foot ulcer: Secondary | ICD-10-CM | POA: Diagnosis not present

## 2018-02-09 DIAGNOSIS — I70203 Unspecified atherosclerosis of native arteries of extremities, bilateral legs: Secondary | ICD-10-CM | POA: Diagnosis not present

## 2018-02-09 DIAGNOSIS — Z794 Long term (current) use of insulin: Secondary | ICD-10-CM | POA: Diagnosis not present

## 2018-02-09 DIAGNOSIS — I10 Essential (primary) hypertension: Secondary | ICD-10-CM | POA: Diagnosis not present

## 2018-02-09 DIAGNOSIS — L97513 Non-pressure chronic ulcer of other part of right foot with necrosis of muscle: Secondary | ICD-10-CM | POA: Diagnosis not present

## 2018-02-09 DIAGNOSIS — E1152 Type 2 diabetes mellitus with diabetic peripheral angiopathy with gangrene: Secondary | ICD-10-CM | POA: Diagnosis not present

## 2018-02-10 ENCOUNTER — Telehealth: Payer: Self-pay | Admitting: Internal Medicine

## 2018-02-10 MED ORDER — SERTRALINE HCL 50 MG PO TABS: 50.0000 mg | ORAL_TABLET | Freq: Every day | ORAL | 3 refills | Status: DC

## 2018-02-10 NOTE — Telephone Encounter (Signed)
Spoke with Magda Paganini from Hospice because called daughter, Kathlee Nations, and did not get an answer. Magda Paganini stated that the daughter stated that they had spoken with Dr. Sharlet Salina and he had stated that he would not be able to do the nerve block because the pt has diabetes. The daughter stated to Magda Paganini that it was discussed that you would speak with Dr. Sharlet Salina directly to let him know that the pt is under strict diabetes control and to see if maybe you talking to him would help. They stated if not then they would like to be referred to [facility] pain clinic.

## 2018-02-10 NOTE — Telephone Encounter (Signed)
Sertraline sent ot [facility] with 1/2 tablet daily with supper for first 4 days

## 2018-02-10 NOTE — Telephone Encounter (Signed)
Pt's daughter, Kathlee Nations, called back and stated that they spoke with Dr. Saralyn Pilar nurse this morning and she stated that they have received the office notes and that everything is laying on the doctors desk to be reviewed. So they are actually waiting on a definite answer from Dr. Sharlet Salina. However they are still wanting to know if Dr. Sharlet Salina does not work out will you be willing to refer the pt to [facility] pain clinic? Kathlee Nations also wanted to see if the Baclofen dose can be reduced for the pt because her other sister seems to think that it is causing memory loss and muscle weakness. She stated that the pt will not do it on his own because it is actually given him some pain relief but they are concerned because the pt has had to have more home health aids come in since starting the baclofen. 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 They are concerned that if he keeps doing this then he is going to be asked to leave the independent living and asked to be moved assisted living and they are wanting to try to prevent this the best they can because the pt wants to continue to be independent. So they are wanting to know if something could be called in to help with his anxiety.

## 2018-02-10 NOTE — Telephone Encounter (Signed)
See other telephone encounter. Left message with daughter to give [place] a call back. Please transfer pt's daughter to our office.

## 2018-02-10 NOTE — Telephone Encounter (Signed)
Copied from [place] [id number]. Topic: Quick Communication - See Telephone Encounter >> Feb 10, 2018 10:00 AM Valla Leaver wrote: CRM for notification. See Telephone encounter for: 02/10/18. Magda Paganini NP w/ palliative medicine calling ot request the patient start anti-anxiety/anti-depressant such as Sertralie 50mg  1x day. This has been dicussed with the atient's daughter as well. Please call Magda Paganini with any questions or concerns.

## 2018-02-10 NOTE — Telephone Encounter (Signed)
Magda Paganini w/ Palliative care inquiring about the status of getting the patient seen by Dr.Chasnis and is requesting someone contact the patients daughter to f/u.

## 2018-02-11 DIAGNOSIS — L97513 Non-pressure chronic ulcer of other part of right foot with necrosis of muscle: Secondary | ICD-10-CM | POA: Diagnosis not present

## 2018-02-11 DIAGNOSIS — E11621 Type 2 diabetes mellitus with foot ulcer: Secondary | ICD-10-CM | POA: Diagnosis not present

## 2018-02-11 DIAGNOSIS — Z794 Long term (current) use of insulin: Secondary | ICD-10-CM | POA: Diagnosis not present

## 2018-02-11 DIAGNOSIS — E1152 Type 2 diabetes mellitus with diabetic peripheral angiopathy with gangrene: Secondary | ICD-10-CM | POA: Diagnosis not present

## 2018-02-11 DIAGNOSIS — I70203 Unspecified atherosclerosis of native arteries of extremities, bilateral legs: Secondary | ICD-10-CM | POA: Diagnosis not present

## 2018-02-11 DIAGNOSIS — I10 Essential (primary) hypertension: Secondary | ICD-10-CM | POA: Diagnosis not present

## 2018-02-11 NOTE — Telephone Encounter (Signed)
Spoke with Magda Paganini from [place] and informed her that Dr. Derrel Nip has sent in the sertraline for the pt to take 1/2 tablet for the first 4 days. Magda Paganini gave a verbal understanding.

## 2018-02-12 ENCOUNTER — Other Ambulatory Visit: Payer: Self-pay | Admitting: Internal Medicine

## 2018-02-12 DIAGNOSIS — L97513 Non-pressure chronic ulcer of other part of right foot with necrosis of muscle: Secondary | ICD-10-CM | POA: Diagnosis not present

## 2018-02-12 DIAGNOSIS — I70242 Atherosclerosis of native arteries of left leg with ulceration of calf: Secondary | ICD-10-CM | POA: Diagnosis not present

## 2018-02-12 DIAGNOSIS — E1152 Type 2 diabetes mellitus with diabetic peripheral angiopathy with gangrene: Secondary | ICD-10-CM | POA: Diagnosis not present

## 2018-02-12 DIAGNOSIS — E11621 Type 2 diabetes mellitus with foot ulcer: Secondary | ICD-10-CM | POA: Diagnosis not present

## 2018-02-12 DIAGNOSIS — L97822 Non-pressure chronic ulcer of other part of left lower leg with fat layer exposed: Secondary | ICD-10-CM | POA: Diagnosis not present

## 2018-02-12 DIAGNOSIS — I70235 Atherosclerosis of native arteries of right leg with ulceration of other part of foot: Secondary | ICD-10-CM | POA: Diagnosis not present

## 2018-02-13 ENCOUNTER — Encounter: Payer: Medicare Other | Attending: Physician Assistant | Admitting: Physician Assistant

## 2018-02-13 ENCOUNTER — Other Ambulatory Visit: Payer: Self-pay | Admitting: Internal Medicine

## 2018-02-13 DIAGNOSIS — L97516 Non-pressure chronic ulcer of other part of right foot with bone involvement without evidence of necrosis: Secondary | ICD-10-CM | POA: Diagnosis not present

## 2018-02-13 DIAGNOSIS — E1151 Type 2 diabetes mellitus with diabetic peripheral angiopathy without gangrene: Secondary | ICD-10-CM | POA: Diagnosis not present

## 2018-02-13 DIAGNOSIS — E1140 Type 2 diabetes mellitus with diabetic neuropathy, unspecified: Secondary | ICD-10-CM | POA: Diagnosis not present

## 2018-02-13 DIAGNOSIS — M069 Rheumatoid arthritis, unspecified: Secondary | ICD-10-CM | POA: Insufficient documentation

## 2018-02-13 DIAGNOSIS — L97522 Non-pressure chronic ulcer of other part of left foot with fat layer exposed: Secondary | ICD-10-CM | POA: Diagnosis not present

## 2018-02-13 DIAGNOSIS — E1122 Type 2 diabetes mellitus with diabetic chronic kidney disease: Secondary | ICD-10-CM | POA: Diagnosis not present

## 2018-02-13 DIAGNOSIS — I2510 Atherosclerotic heart disease of native coronary artery without angina pectoris: Secondary | ICD-10-CM | POA: Diagnosis not present

## 2018-02-13 DIAGNOSIS — N186 End stage renal disease: Secondary | ICD-10-CM | POA: Insufficient documentation

## 2018-02-13 DIAGNOSIS — L97526 Non-pressure chronic ulcer of other part of left foot with bone involvement without evidence of necrosis: Secondary | ICD-10-CM | POA: Diagnosis not present

## 2018-02-13 DIAGNOSIS — I890 Lymphedema, not elsewhere classified: Secondary | ICD-10-CM | POA: Diagnosis not present

## 2018-02-13 DIAGNOSIS — I132 Hypertensive heart and chronic kidney disease with heart failure and with stage 5 chronic kidney disease, or end stage renal disease: Secondary | ICD-10-CM | POA: Insufficient documentation

## 2018-02-13 DIAGNOSIS — Z885 Allergy status to narcotic agent status: Secondary | ICD-10-CM | POA: Diagnosis not present

## 2018-02-13 DIAGNOSIS — I5022 Chronic systolic (congestive) heart failure: Secondary | ICD-10-CM | POA: Diagnosis not present

## 2018-02-13 DIAGNOSIS — E11622 Type 2 diabetes mellitus with other skin ulcer: Secondary | ICD-10-CM | POA: Insufficient documentation

## 2018-02-13 DIAGNOSIS — I771 Stricture of artery: Secondary | ICD-10-CM | POA: Diagnosis not present

## 2018-02-13 DIAGNOSIS — T8189XA Other complications of procedures, not elsewhere classified, initial encounter: Secondary | ICD-10-CM | POA: Diagnosis not present

## 2018-02-13 DIAGNOSIS — E11621 Type 2 diabetes mellitus with foot ulcer: Secondary | ICD-10-CM | POA: Diagnosis not present

## 2018-02-13 DIAGNOSIS — L97512 Non-pressure chronic ulcer of other part of right foot with fat layer exposed: Secondary | ICD-10-CM | POA: Diagnosis not present

## 2018-02-16 ENCOUNTER — Other Ambulatory Visit: Payer: Self-pay | Admitting: Internal Medicine

## 2018-02-16 DIAGNOSIS — Z89422 Acquired absence of other left toe(s): Secondary | ICD-10-CM

## 2018-02-16 DIAGNOSIS — Z794 Long term (current) use of insulin: Secondary | ICD-10-CM

## 2018-02-16 DIAGNOSIS — I70235 Atherosclerosis of native arteries of right leg with ulceration of other part of foot: Secondary | ICD-10-CM | POA: Diagnosis not present

## 2018-02-16 DIAGNOSIS — E11621 Type 2 diabetes mellitus with foot ulcer: Secondary | ICD-10-CM | POA: Diagnosis not present

## 2018-02-16 DIAGNOSIS — M6281 Muscle weakness (generalized): Secondary | ICD-10-CM | POA: Diagnosis not present

## 2018-02-16 DIAGNOSIS — L97513 Non-pressure chronic ulcer of other part of right foot with necrosis of muscle: Secondary | ICD-10-CM | POA: Diagnosis not present

## 2018-02-16 DIAGNOSIS — L97822 Non-pressure chronic ulcer of other part of left lower leg with fat layer exposed: Secondary | ICD-10-CM | POA: Diagnosis not present

## 2018-02-16 DIAGNOSIS — E1152 Type 2 diabetes mellitus with diabetic peripheral angiopathy with gangrene: Secondary | ICD-10-CM | POA: Diagnosis not present

## 2018-02-16 DIAGNOSIS — R2689 Other abnormalities of gait and mobility: Secondary | ICD-10-CM | POA: Diagnosis not present

## 2018-02-16 DIAGNOSIS — I70242 Atherosclerosis of native arteries of left leg with ulceration of calf: Secondary | ICD-10-CM | POA: Diagnosis not present

## 2018-02-16 DIAGNOSIS — L97922 Non-pressure chronic ulcer of unspecified part of left lower leg with fat layer exposed: Secondary | ICD-10-CM

## 2018-02-16 DIAGNOSIS — I10 Essential (primary) hypertension: Secondary | ICD-10-CM | POA: Diagnosis not present

## 2018-02-16 NOTE — Progress Notes (Addendum)
GRANVEL, PROUDFOOT ([id number]) Visit Report for 02/13/2018 Arrival Information Details Patient Name: [name], [name]. Date of Service: 02/13/2018 1:30 PM Medical Record Number: [id number] Patient Account Number: [id number] Date of Birth/Sex: 10-10-1924 (85 y.o. M) Treating RN: Montey Hora Primary Care Tor Tsuda: Deborra Medina Other Clinician: Referring Lakina Mcintire: Deborra Medina Treating Felder Lebeda/Extender: Melburn Hake, HOYT Weeks in Treatment: 18 Visit Information History Since Last Visit Added or deleted any medications: No Patient Arrived: Wheel Chair Any new allergies or adverse reactions: No Arrival Time: 13:24 Had a fall or experienced change in No Accompanied By: nurse aid- activities of daily living that may affect Stephanie risk of falls: Transfer Assistance: None Signs or symptoms of abuse/neglect since last visito No Patient Identification Verified: Yes Hospitalized since last visit: No Secondary Verification Process Yes Implantable device outside of the clinic excluding No Completed: cellular tissue based products placed in the center since last visit: Has Dressing in Place as Prescribed: Yes Pain Present Now: No Electronic Signature(s) Signed: 02/13/2018 3:31:07 PM By: Lorine Bears RCP, RRT, CHT Entered By: Lorine Bears on 02/13/2018 13:26:55 Revoir, [name] ([id number]) -------------------------------------------------------------------------------- Clinic Level of Care Assessment Details Patient Name: [name], [name]. Date of Service: 02/13/2018 1:30 PM Medical Record Number: [id number] Patient Account Number: [id number] Date of Birth/Sex: 1924-03-21 (85 y.o. M) Treating RN: Harold Barban Primary Care Dortha Neighbors: Deborra Medina Other Clinician: Referring Angeles Zehner: Deborra Medina Treating Rigel Filsinger/Extender: Melburn Hake, HOYT Weeks in Treatment: 18 Clinic Level of Care Assessment Items TOOL 4 Quantity Score []  - Use when only an  EandM is performed on FOLLOW-UP visit 0 ASSESSMENTS - Nursing Assessment / Reassessment X - Reassessment of Co-morbidities (includes updates in patient status) 1 10 X- 1 5 Reassessment of Adherence to Treatment Plan ASSESSMENTS - Wound and Skin Assessment / Reassessment []  - Simple Wound Assessment / Reassessment - one wound 0 X- 6 5 Complex Wound Assessment / Reassessment - multiple wounds []  - 0 Dermatologic / Skin Assessment (not related to wound area) ASSESSMENTS - Focused Assessment []  - Circumferential Edema Measurements - multi extremities 0 []  - 0 Nutritional Assessment / Counseling / Intervention []  - 0 Lower Extremity Assessment (monofilament, tuning fork, pulses) []  - 0 Peripheral Arterial Disease Assessment (using hand held doppler) ASSESSMENTS - Ostomy and/or Continence Assessment and Care []  - Incontinence Assessment and Management 0 []  - 0 Ostomy Care Assessment and Management (repouching, etc.) PROCESS - Coordination of Care X - Simple Patient / Family Education for ongoing care 1 15 []  - 0 Complex (extensive) Patient / Family Education for ongoing care X- 1 10 Staff obtains Programmer, systems, Records, Test Results / Process Orders []  - 0 Staff telephones HHA, Nursing Homes / Clarify orders / etc []  - 0 Routine Transfer to another Facility (non-emergent condition) []  - 0 Routine Hospital Admission (non-emergent condition) []  - 0 New Admissions / Biomedical engineer / Ordering NPWT, Apligraf, etc. []  - 0 Emergency Hospital Admission (emergent condition) X- 1 10 Simple Discharge Coordination Mosley, Tremel S. ([id number]) []  - 0 Complex (extensive) Discharge Coordination PROCESS - Special Needs []  - Pediatric / Minor Patient Management 0 []  - 0 Isolation Patient Management []  - 0 Hearing / Language / Visual special needs []  - 0 Assessment of Community assistance (transportation, D/C planning, etc.) []  - 0 Additional assistance / Altered mentation []  -  0 Support Surface(s) Assessment (bed, cushion, seat, etc.) INTERVENTIONS - Wound Cleansing / Measurement []  - Simple Wound Cleansing - one wound 0 X- 6 5 Complex Wound Cleansing - multiple  wounds X- 1 5 Wound Imaging (photographs - any number of wounds) []  - 0 Wound Tracing (instead of photographs) []  - 0 Simple Wound Measurement - one wound X- 6 5 Complex Wound Measurement - multiple wounds INTERVENTIONS - Wound Dressings X - Small Wound Dressing one or multiple wounds 6 10 []  - 0 Medium Wound Dressing one or multiple wounds []  - 0 Large Wound Dressing one or multiple wounds []  - 0 Application of Medications - topical []  - 0 Application of Medications - injection INTERVENTIONS - Miscellaneous []  - External ear exam 0 []  - 0 Specimen Collection (cultures, biopsies, blood, body fluids, etc.) []  - 0 Specimen(s) / Culture(s) sent or taken to Lab for analysis []  - 0 Patient Transfer (multiple staff / Civil Service fast streamer / Similar devices) []  - 0 Simple Staple / Suture removal (25 or less) []  - 0 Complex Staple / Suture removal (26 or more) []  - 0 Hypo / Hyperglycemic Management (close monitor of Blood Glucose) []  - 0 Ankle / Brachial Index (ABI) - do not check if billed separately X- 1 5 Vital Signs Archibald, Christy S. ([id number]) Has the patient been seen at the hospital within the last three years: Yes Total Score: 210 Level Of Care: New/Established - Level 5 Electronic Signature(s) Signed: 02/16/2018 3:50:02 PM By: Harold Barban Entered By: Harold Barban on 02/13/2018 14:29:48 Degrace, [name] ([id number]) -------------------------------------------------------------------------------- Encounter Discharge Information Details Patient Name: [name], [name] Date of Service: 02/13/2018 1:30 PM Medical Record Number: [id number] Patient Account Number: [id number] Date of Birth/Sex: [birth year] (85 y.o. M) Treating RN: Cornell Barman Primary Care Kaelyn Innocent: Deborra Medina Other  Clinician: Referring Farin Buhman: Deborra Medina Treating Brittany Amirault/Extender: Melburn Hake, HOYT Weeks in Treatment: 31 Encounter Discharge Information Items Discharge Condition: Stable Ambulatory Status: Wheelchair Discharge Destination: Home Transportation: Private Auto Accompanied By: caregiver Schedule Follow-up Appointment: Yes Clinical Summary of Care: Electronic Signature(s) Signed: 02/13/2018 3:19:28 PM By: Gretta Cool, BSN, RN, CWS, Kim RN, BSN Entered By: Gretta Cool, BSN, RN, CWS, Kim on 02/13/2018 14:59:31 Vandruff, [name] ([id number]) -------------------------------------------------------------------------------- Lower Extremity Assessment Details Patient Name: [name], [name] Date of Service: 02/13/2018 1:30 PM Medical Record Number: [id number] Patient Account Number: [id number] Date of Birth/Sex: Aug 07, 1924 (85 y.o. M) Treating RN: Secundino Ginger Primary Care Gentry Pilson: Deborra Medina Other Clinician: Referring Lydiana Milley: Deborra Medina Treating Naama Sappington/Extender: Melburn Hake, HOYT Weeks in Treatment: 18 Edema Assessment Assessed: [Left: No] Patrice Paradise: No] [Left: Edema] [Right: :] Calf Left: Right: Point of Measurement: 30 cm From Medial Instep cm cm Ankle Left: Right: Point of Measurement: 10 cm From Medial Instep cm cm Vascular Assessment Claudication: Claudication Assessment [Left:None] [Right:None] Pulses: Dorsalis Pedis Palpable: [Left:Yes] [Right:Yes] Posterior Tibial Extremity colors, hair growth, and conditions: Extremity Color: [Left:Hyperpigmented] [Right:Hyperpigmented] Hair Growth on Extremity: [Left:No] [Right:No] Temperature of Extremity: [Left:Warm] [Right:Warm] Capillary Refill: [Right:< 3 seconds] Toe Nail Assessment Left: Right: Thick: Yes Yes Discolored: No No Deformed: No No Improper Length and Hygiene: No No Electronic Signature(s) Signed: 02/13/2018 3:39:37 PM By: Secundino Ginger Entered By: Secundino Ginger on 02/13/2018 14:16:41 Echeverria, [name]  ([id number]) -------------------------------------------------------------------------------- Multi Wound Chart Details Patient Name: [name], [name] Date of Service: 02/13/2018 1:30 PM Medical Record Number: [id number] Patient Account Number: [id number] Date of Birth/Sex: September 07, 1924 (85 y.o. M) Treating RN: Harold Barban Primary Care Taelon Bendorf: Deborra Medina Other Clinician: Referring Estefano Victory: Deborra Medina Treating Edison Nicholson/Extender: Melburn Hake, HOYT Weeks in Treatment: 18 Vital Signs Height(in): 68 Pulse(bpm): 69 Weight(lbs): 180 Blood Pressure(mmHg): 132/55 Body Mass Index(BMI): 27 Temperature(F): 97.7 Respiratory Rate 16 (breaths/min): Photos: [1:No  Photos] [3:No Photos] [4:No Photos] Wound Location: [1:Right Toe Second] [3:Left Toe Fifth - Lateral] [4:Left Foot - Dorsal, Medial] Wounding Event: [1:Gradually Appeared] [3:Not Known] [4:Trauma] Primary Etiology: [1:Diabetic Wound/Ulcer of the Lower Extremity] [3:Diabetic Wound/Ulcer of the Lower Extremity] [4:Diabetic Wound/Ulcer of the Lower Extremity] Secondary Etiology: [1:Arterial Insufficiency Ulcer] [3:Arterial Insufficiency Ulcer] [4:Arterial Insufficiency Ulcer] Comorbid History: [1:Cataracts, Lymphedema, Coronary Artery Disease, Hypertension, Peripheral Venous Disease, Type II Diabetes, End Stage Renal Disease, Rheumatoid Arthritis, Neuropathy] [3:Cataracts, Lymphedema, Coronary Artery Disease, Hypertension,  Peripheral Venous Disease, Type II Diabetes, End Stage Renal Disease, Rheumatoid Arthritis, Neuropathy] [4:Cataracts, Lymphedema, Coronary Artery Disease, Hypertension, Peripheral Venous Disease, Type II Diabetes, End Stage Renal Disease, Rheumatoid  Arthritis, Neuropathy] Date Acquired: [1:06/04/2017] [3:08/05/2017] [4:11/20/2017] Weeks of Treatment: [1:18] [3:12] [4:11] Wound Status: [1:Open] [3:Open] [4:Open] Pending Amputation on [1:Yes] [3:No] [4:No] Presentation: Measurements L x W x D [1:0.7x0.5x0.2]  [3:1x1.5x0.1] [4:2.6x1.5x0.3] (cm) Area (cm) : [1:0.275] [3:1.178] [4:3.063] Volume (cm) : [1:0.055] [3:0.118] [4:0.919] % Reduction in Area: [1:76.70%] [3:-150.10%] [4:-456.90%] % Reduction in Volume: [1:76.70%] [3:-151.10%] [4:-735.50%] Classification: [1:Grade 2] [3:Grade 2] [4:Grade 2] Exudate Amount: [1:Small] [3:Small] [4:Medium] Exudate Type: [1:Serous] [3:Serosanguineous] [4:Serous] Exudate Color: [1:amber] [3:red, brown] [4:amber] Granulation Amount: [1:None Present (0%)] [3:None Present (0%)] [4:Small (1-33%)] Granulation Quality: [1:N/A] [3:N/A] [4:N/A] Necrotic Amount: [1:Small (1-33%)] [3:Large (67-100%)] [4:Large (67-100%)] Necrotic Tissue: [1:Adherent Slough] [3:Eschar] [4:Eschar, Adherent Slough] Exposed Structures: [1:Fat Layer (Subcutaneous Tissue) Exposed: Yes Fascia: No Tendon: No Muscle: No] [3:Fat Layer (Subcutaneous Tissue) Exposed: Yes Fascia: No Tendon: No Muscle: No] [4:Fat Layer (Subcutaneous Tissue) Exposed: Yes Fascia: No Tendon: No Muscle: No] Joint: No Joint: No Joint: No Bone: No Bone: No Bone: No Periwound Skin Texture: Excoriation: No Excoriation: No Excoriation: No Induration: No Induration: No Induration: No Callus: No Callus: No Callus: No Crepitus: No Crepitus: No Crepitus: No Rash: No Rash: No Rash: No Scarring: No Scarring: No Scarring: No Periwound Skin Moisture: Maceration: No Maceration: No Maceration: No Dry/Scaly: No Dry/Scaly: No Dry/Scaly: No Periwound Skin Color: Atrophie Blanche: No Atrophie Blanche: No Atrophie Blanche: No Cyanosis: No Cyanosis: No Cyanosis: No Ecchymosis: No Ecchymosis: No Ecchymosis: No Erythema: No Erythema: No Erythema: No Hemosiderin Staining: No Hemosiderin Staining: No Hemosiderin Staining: No Mottled: No Mottled: No Mottled: No Pallor: No Pallor: No Pallor: No Rubor: No Rubor: No Rubor: No Tenderness on Palpation: Yes No No Wound Preparation: Ulcer  Cleansing: Ulcer Cleansing: Ulcer Cleansing: Rinsed/Irrigated with Saline, Rinsed/Irrigated with Saline, Rinsed/Irrigated with Saline, Other: soap and water Other: soap and water Other: soap and water Topical Anesthetic Applied: Topical Anesthetic Applied: Topical Anesthetic Applied: Other: lidocaine 4% Other: lidocaine 4% Other: lidocaine 4% Wound Number: [id number]  Photos: No Photos No Photos No Photos Wound Location: Left Toe Third Right Foot - Dorsal Left Malleolus - Medial Wounding Event: Surgical Injury Not Known Gradually Appeared 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 Secondary Etiology: N/A N/A N/A Comorbid History: Cataracts, Lymphedema, Cataracts, Lymphedema, Cataracts, Lymphedema, Coronary Artery Disease, Coronary Artery Disease, Coronary Artery Disease, Hypertension, Peripheral Hypertension, Peripheral Hypertension, Peripheral Venous Disease, Type II Venous Disease, Type II Venous Disease, Type II Diabetes, End Stage Renal Diabetes, End Stage Renal Diabetes, End Stage Renal Disease, Rheumatoid Arthritis, Disease, Rheumatoid Arthritis, Disease, Rheumatoid Arthritis, Neuropathy Neuropathy Neuropathy Date Acquired: 11/18/2017 12/25/2017 12/29/2017 Weeks of Treatment: 9 7 5  Wound Status: Open Open Healed - Epithelialized Pending Amputation on No No No Presentation: Measurements L x W x D 3.2x1.4x0.3 3x2.8x0.1 0x0x0 (cm) Area (cm) : 3.519 6.597 0 Volume (cm) :  1.056 0.66 0 % Reduction in Area: -49.40% 66.30% 100.00% % Reduction in Volume: 44.00% 66.30% 100.00% Classification: Full Thickness Without Full Thickness Without Full Thickness Without Exposed Support Structures Exposed Support Structures Exposed Support Structures Exudate Amount: Small Medium N/A Exudate Type: Purulent Serous N/A Exudate Color: yellow, brown, green amber N/A Granulation Amount: None Present (0%) Small (1-33%) N/A Granulation  Quality: N/A N/A N/A Necrotic Amount: Large (67-100%) Large (67-100%) N/A Viereck, Fortino SMarland Kitchen ([id number]) Necrotic Tissue: Eschar, Adherent [place] N/A Exposed Structures: Fat Layer (Subcutaneous Fat Layer (Subcutaneous N/A Tissue) Exposed: Yes Tissue) Exposed: Yes Fascia: No Fascia: No Tendon: No Tendon: No Muscle: No Muscle: No Joint: No Joint: No Bone: No Bone: No Periwound Skin Texture: Excoriation: No Excoriation: No No Abnormalities Noted Induration: No Induration: No Callus: No Callus: No Crepitus: No Crepitus: No Rash: No Rash: No Scarring: No Scarring: No Periwound Skin Moisture: Maceration: No Maceration: No No Abnormalities Noted Dry/Scaly: No Dry/Scaly: No Periwound Skin Color: Atrophie Blanche: No Atrophie Blanche: No No Abnormalities Noted Cyanosis: No Cyanosis: No Ecchymosis: No Ecchymosis: No Erythema: No Erythema: No Hemosiderin Staining: No Hemosiderin Staining: No Mottled: No Mottled: No Pallor: No Pallor: No Rubor: No Rubor: No Tenderness on Palpation: No No No Wound Preparation: Ulcer Cleansing: Ulcer Cleansing: N/A Rinsed/Irrigated with Saline, Rinsed/Irrigated with Saline, Other: soap and water Other: soap and water Topical Anesthetic Applied: Topical Anesthetic Applied: Other: lidocaine 4% Other: lidocaine 4% Wound Number: 9 N/A N/A Photos: No Photos N/A N/A Wound Location: Left Lower Leg - Medial, Distal N/A N/A Wounding Event: Gradually Appeared N/A N/A Primary Etiology: Diabetic Wound/Ulcer of the N/A N/A Lower Extremity Secondary Etiology: N/A N/A N/A Comorbid History: Cataracts, Lymphedema, N/A N/A Coronary Artery Disease, Hypertension, Peripheral Venous Disease, Type II Diabetes, End Stage Renal Disease, Rheumatoid Arthritis, Neuropathy Date Acquired: 02/01/2018 N/A N/A Weeks of Treatment: 0 N/A N/A Wound Status: Open N/A N/A Pending Amputation on No N/A N/A Presentation: Measurements L x W x  D 7.5x10.3x0.1 N/A N/A (cm) Area (cm) : 49.702 N/A N/A Volume (cm) : 6.067 N/A N/A % Reduction in Area: N/A N/A N/A % Reduction in Volume: N/A N/A N/A Classification: Grade 2 N/A N/A [name], [name] ([id number]) Exudate Amount: Large N/A N/A Exudate Type: Serous N/A N/A Exudate Color: amber N/A N/A Granulation Amount: Small (1-33%) N/A N/A Granulation Quality: Pink, Pale, Hyper-granulation N/A N/A Necrotic Amount: Large (67-100%) N/A N/A Necrotic Tissue: Adherent Slough N/A N/A Exposed Structures: Fat Layer (Subcutaneous N/A N/A Tissue) Exposed: Yes Fascia: No Tendon: No Muscle: No Joint: No Bone: No Periwound Skin Texture: Excoriation: No N/A N/A Induration: No Callus: No Crepitus: No Rash: No Scarring: No Periwound Skin Moisture: Maceration: No N/A N/A Dry/Scaly: No Periwound Skin Color: Atrophie Blanche: No N/A N/A Cyanosis: No Ecchymosis: No Erythema: No Hemosiderin Staining: No Mottled: No Pallor: No Rubor: No Tenderness on Palpation: No N/A N/A Wound Preparation: Ulcer Cleansing: N/A N/A Rinsed/Irrigated with Saline, Other: soap and water Topical Anesthetic Applied: Other: lidocaine 4% Treatment Notes Electronic Signature(s) Signed: 02/16/2018 3:50:02 PM By: Harold Barban Entered By: Harold Barban on 02/13/2018 14:21:07 [name], [name] ([id number]) -------------------------------------------------------------------------------- [place] Details Patient Name: Picado, Indalecio S. Date of Service: 02/13/2018 1:30 PM Medical Record Number: [id number] Patient Account Number: [id number] Date of Birth/Sex: 1925/01/15 (85 y.o. M) Treating RN: Harold Barban Primary Care Kamyia Thomason: Deborra Medina Other Clinician: Referring Nuchem Grattan: Deborra Medina Treating Findley Vi/Extender: Melburn Hake, HOYT Weeks in Treatment: 37 Active Inactive Electronic Signature(s) Signed: 02/24/2018 5:52:01 PM By: Gretta Cool,  BSN, RN, CWS, Leisure centre manager, BSN Signed:  04/13/2018 10:53:34 AM By: Harold Barban Previous Signature: 02/16/2018 3:50:02 PM Version By: Harold Barban Entered By: Gretta Cool BSN, RN, CWS, Kim on 02/21/2018 17:52:00 Silba, [name] ([id number]) -------------------------------------------------------------------------------- Pain Assessment Details Patient Name: Beattie, Kaitlin S. Date of Service: 02/13/2018 1:30 PM Medical Record Number: [id number] Patient Account Number: [id number] Date of Birth/Sex: [birth year] (85 y.o. M) Treating RN: Montey Hora Primary Care Manuelito Poage: Deborra Medina Other Clinician: Referring Analeah Brame: Deborra Medina Treating Edrei Norgaard/Extender: Melburn Hake, HOYT Weeks in Treatment: 32 Active Problems Location of Pain Severity and Description of Pain Patient Has Paino No Site Locations Pain Management and Medication Current Pain Management: Electronic Signature(s) Signed: 02/13/2018 3:29:00 PM By: Montey Hora Signed: 02/13/2018 3:31:07 PM By: Lorine Bears RCP, RRT, CHT Entered By: Lorine Bears on 02/13/2018 13:27:10 Massimo, [name] ([id number]) -------------------------------------------------------------------------------- Patient/Caregiver Education Details Patient Name: Strickler, Delford S. Date of Service: 02/13/2018 1:30 PM Medical Record Number: [id number] Patient Account Number: [id number] Date of Birth/Gender: 09-May-1924 (85 y.o. M) Treating RN: Harold Barban Primary Care Physician: Deborra Medina Other Clinician: Referring Physician: Deborra Medina Treating Physician/Extender: Sharalyn Ink in Treatment: 42 Education Assessment Education Provided To: Patient Education Topics Provided Wound/Skin Impairment: Handouts: Caring for Your Ulcer Methods: Demonstration, Explain/Verbal Responses: State content correctly Electronic Signature(s) Signed: 02/16/2018 3:50:02 PM By: Harold Barban Entered By: Harold Barban on 02/13/2018 14:21:42 Peeler, [name]  ([id number]) -------------------------------------------------------------------------------- Wound Assessment Details Patient Name: Ripley, Andruw S. Date of Service: 02/13/2018 1:30 PM Medical Record Number: [id number] Patient Account Number: [id number] Date of Birth/Sex: July 19, 1924 (85 y.o. M) Treating RN: Secundino Ginger Primary Care Naevia Unterreiner: Deborra Medina Other Clinician: Referring Endya Austin: Deborra Medina Treating Jacari Iannello/Extender: Melburn Hake, HOYT Weeks in Treatment: 18 Wound Status Wound Number: 1 Primary Diabetic Wound/Ulcer of the Lower Extremity Etiology: Wound Location: Right Toe Second Secondary Arterial Insufficiency Ulcer Wounding Event: Gradually Appeared Etiology: Date Acquired: 06/04/2017 Wound Open Weeks Of Treatment: 18 Status: Clustered Wound: No Comorbid Cataracts, Lymphedema, Coronary Artery Pending Amputation On Presentation History: Disease, Hypertension, Peripheral Venous Disease, Type II Diabetes, End Stage Renal Disease, Rheumatoid Arthritis, Neuropathy Photos Photo Uploaded By: Secundino Ginger on 02/13/2018 14:24:54 Wound Measurements Length: (cm) 0.7 Width: (cm) 0.5 Depth: (cm) 0.2 Area: (cm) 0.275 Volume: (cm) 0.055 % Reduction in Area: 76.7% % Reduction in Volume: 76.7% Tunneling: No Undermining: No Wound Description Classification: Grade 2 Foul Odor A Exudate Amount: Small Slough/Fibr Exudate Type: Serous Exudate Color: amber fter Cleansing: No ino Yes Wound Bed Granulation Amount: None Present (0%) Exposed Structure Necrotic Amount: Small (1-33%) Fascia Exposed: No Necrotic Quality: Adherent Slough Fat Layer (Subcutaneous Tissue) Exposed: Yes Tendon Exposed: No Muscle Exposed: No Joint Exposed: No Bone Exposed: No Stegall, Domanick S. ([id number]) Periwound Skin Texture Texture Color No Abnormalities Noted: No No Abnormalities Noted: No Callus: No Atrophie Blanche: No Crepitus: No Cyanosis: No Excoriation: No Ecchymosis:  No Induration: No Erythema: No Rash: No Hemosiderin Staining: No Scarring: No Mottled: No Pallor: No Moisture Rubor: No No Abnormalities Noted: No Dry / Scaly: No Temperature / Pain Maceration: No Tenderness on Palpation: Yes Wound Preparation Ulcer Cleansing: Rinsed/Irrigated with Saline, Other: soap and water, Topical Anesthetic Applied: Other: lidocaine 4%, Electronic Signature(s) Signed: 02/13/2018 3:39:37 PM By: Secundino Ginger Entered By: Secundino Ginger on 02/13/2018 13:52:31 Taillon, [name] ([id number]) -------------------------------------------------------------------------------- Wound Assessment Details Patient Name: [name], [name] Date of Service: 02/13/2018 1:30 PM Medical Record Number: [id number] Patient Account Number: [id number] Date of Birth/Sex: [birth year] (85 y.o. M) Treating RN: Cletus Gash,  Wendi Primary Care Monicia Tse: Deborra Medina Other Clinician: Referring Laurel Harnden: Deborra Medina Treating Angie Piercey/Extender: Melburn Hake, HOYT Weeks in Treatment: 18 Wound Status Wound Number: 3 Primary Diabetic Wound/Ulcer of the Lower Extremity Etiology: Wound Location: Left Toe Fifth - Lateral Secondary Arterial Insufficiency Ulcer Wounding Event: Not Known Etiology: Date Acquired: 08/05/2017 Wound Open Weeks Of Treatment: 12 Status: Clustered Wound: No Comorbid Cataracts, Lymphedema, Coronary Artery History: Disease, Hypertension, Peripheral Venous Disease, Type II Diabetes, End Stage Renal Disease, Rheumatoid Arthritis, Neuropathy Photos Photo Uploaded By: Secundino Ginger on 02/13/2018 14:24:55 Wound Measurements Length: (cm) 1 % Reducti Width: (cm) 1.5 % Reducti Depth: (cm) 0.1 Tunneling Area: (cm) 1.178 Undermin Volume: (cm) 0.118 on in Area: -150.1% on in Volume: -151.1% : No ing: No Wound Description Classification: Grade 2 Foul Odor Exudate Amount: Small Slough/Fi Exudate Type: Serosanguineous Exudate Color: red, brown After Cleansing: No brino No Wound  Bed Granulation Amount: None Present (0%) Exposed Structure Necrotic Amount: Large (67-100%) Fascia Exposed: No Necrotic Quality: Eschar Fat Layer (Subcutaneous Tissue) Exposed: Yes Tendon Exposed: No Muscle Exposed: No Joint Exposed: No Bone Exposed: No Finks, Noah S. ([id number]) Periwound Skin Texture Texture Color No Abnormalities Noted: No No Abnormalities Noted: No Callus: No Atrophie Blanche: No Crepitus: No Cyanosis: No Excoriation: No Ecchymosis: No Induration: No Erythema: No Rash: No Hemosiderin Staining: No Scarring: No Mottled: No Pallor: No Moisture Rubor: No No Abnormalities Noted: No Dry / Scaly: No Maceration: No Wound Preparation Ulcer Cleansing: Rinsed/Irrigated with Saline, Other: soap and water, Topical Anesthetic Applied: Other: lidocaine 4%, Electronic Signature(s) Signed: 02/13/2018 3:39:37 PM By: Secundino Ginger Entered By: Secundino Ginger on 02/13/2018 13:54:59 Frisbie, [name] ([id number]) -------------------------------------------------------------------------------- Wound Assessment Details Patient Name: [name], [name] Date of Service: 02/13/2018 1:30 PM Medical Record Number: [id number] Patient Account Number: [id number] Date of Birth/Sex: 10-22-1924 (85 y.o. M) Treating RN: Secundino Ginger Primary Care Lastacia Solum: Deborra Medina Other Clinician: Referring Kypton Eltringham: Deborra Medina Treating Vika Buske/Extender: Melburn Hake, HOYT Weeks in Treatment: 18 Wound Status Wound Number: 4 Primary Diabetic Wound/Ulcer of the Lower Extremity Etiology: Wound Location: Left Foot - Dorsal, Medial Secondary Arterial Insufficiency Ulcer Wounding Event: Trauma Etiology: Date Acquired: 11/20/2017 Wound Open Weeks Of Treatment: 11 Status: Clustered Wound: No Comorbid Cataracts, Lymphedema, Coronary Artery History: Disease, Hypertension, Peripheral Venous Disease, Type II Diabetes, End Stage Renal Disease, Rheumatoid Arthritis, Neuropathy Photos Photo  Uploaded By: Secundino Ginger on 02/13/2018 14:30:27 Wound Measurements Length: (cm) 2.6 Width: (cm) 1.5 Depth: (cm) 0.3 Area: (cm) 3.063 Volume: (cm) 0.919 % Reduction in Area: -456.9% % Reduction in Volume: -735.5% Tunneling: No Undermining: No Wound Description Classification: Grade 2 Exudate Amount: Medium Exudate Type: Serous Exudate Color: amber Wound Bed Granulation Amount: Small (1-33%) Exposed Structure Necrotic Amount: Large (67-100%) Fascia Exposed: No Necrotic Quality: Eschar, Adherent Slough Fat Layer (Subcutaneous Tissue) Exposed: Yes Tendon Exposed: No Muscle Exposed: No Joint Exposed: No Bone Exposed: No Dabbs, Saat[place] S. ([id number]) Periwound Skin Texture Texture Color No Abnormalities Noted: No No Abnormalities Noted: No Callus: No Atrophie Blanche: No Crepitus: No Cyanosis: No Excoriation: No Ecchymosis: No Induration: No Erythema: No Rash: No Hemosiderin Staining: No Scarring: No Mottled: No Pallor: No Moisture Rubor: No No Abnormalities Noted: No Dry / Scaly: No Maceration: No Wound Preparation Ulcer Cleansing: Rinsed/Irrigated with Saline, Other: soap and water, Topical Anesthetic Applied: Other: lidocaine 4%, Electronic Signature(s) Signed: 02/13/2018 3:39:37 PM By: Secundino Ginger Entered By: Secundino Ginger on 02/13/2018 13:57:40 Didion, [name] ([id number]) -------------------------------------------------------------------------------- Wound Assessment Details Patient Name: Crew, [name] Date of Service:  02/13/2018 1:30 PM Medical Record Number: [id number] Patient Account Number: [id number] Date of Birth/Sex: October 09, 1924 (85 y.o. M) Treating RN: Secundino Ginger Primary Care Keyairra Kolinski: Deborra Medina Other Clinician: Referring Eragon Hammond: Deborra Medina Treating Ciera Beckum/Extender: Melburn Hake, HOYT Weeks in Treatment: 18 Wound Status Wound Number: 5 Primary Arterial Insufficiency Ulcer Etiology: Wound Location: Left Toe Third Wound  Open Wounding Event: Surgical Injury Status: Date Acquired: 11/18/2017 Comorbid Cataracts, Lymphedema, Coronary Artery Weeks Of Treatment: 9 History: Disease, Hypertension, Peripheral Venous Clustered Wound: No Disease, Type II Diabetes, End Stage Renal Disease, Rheumatoid Arthritis, Neuropathy Photos Photo Uploaded By: Secundino Ginger on 02/13/2018 14:30:28 Wound Measurements Length: (cm) 3.2 Width: (cm) 1.4 Depth: (cm) 0.3 Area: (cm) 3.519 Volume: (cm) 1.056 % Reduction in Area: -49.4% % Reduction in Volume: 44% Tunneling: No Undermining: No Wound Description Full Thickness Without Exposed Support Foul Classification: Structures Sloug Exudate Small Amount: Exudate Type: Purulent Exudate Color: yellow, brown, green Odor After Cleansing: No h/Fibrino No Wound Bed Granulation Amount: None Present (0%) Exposed Structure Necrotic Amount: Large (67-100%) Fascia Exposed: No Necrotic Quality: Eschar, Adherent Slough Fat Layer (Subcutaneous Tissue) Exposed: Yes Tendon Exposed: No Muscle Exposed: No Joint Exposed: No Bone Exposed: No Pareja, Kahmari S. ([id number]) Periwound Skin Texture Texture Color No Abnormalities Noted: No No Abnormalities Noted: No Callus: No Atrophie Blanche: No Crepitus: No Cyanosis: No Excoriation: No Ecchymosis: No Induration: No Erythema: No Rash: No Hemosiderin Staining: No Scarring: No Mottled: No Pallor: No Moisture Rubor: No No Abnormalities Noted: No Dry / Scaly: No Maceration: No Wound Preparation Ulcer Cleansing: Rinsed/Irrigated with Saline, Other: soap and water, Topical Anesthetic Applied: Other: lidocaine 4%, Electronic Signature(s) Signed: 02/13/2018 3:39:37 PM By: Secundino Ginger Entered By: Secundino Ginger on 02/13/2018 14:01:44 Bilton, [name] ([id number]) -------------------------------------------------------------------------------- Wound Assessment Details Patient Name: [name], [name] Date of Service: 02/13/2018  1:30 PM Medical Record Number: [id number] Patient Account Number: [id number] Date of Birth/Sex: 19-Jan-1925 (85 y.o. M) Treating RN: Secundino Ginger Primary Care Autumn Pruitt: Deborra Medina Other Clinician: Referring Luise Yamamoto: Deborra Medina Treating Theron Cumbie/Extender: Melburn Hake, HOYT Weeks in Treatment: 18 Wound Status Wound Number: 6 Primary Arterial Insufficiency Ulcer Etiology: Wound Location: Right Foot - Dorsal Wound Open Wounding Event: Not Known Status: Date Acquired: 12/25/2017 Comorbid Cataracts, Lymphedema, Coronary Artery Weeks Of Treatment: 7 History: Disease, Hypertension, Peripheral Venous Clustered Wound: No Disease, Type II Diabetes, End Stage Renal Disease, Rheumatoid Arthritis, Neuropathy Photos Photo Uploaded By: Secundino Ginger on 02/13/2018 14:31:20 Wound Measurements Length: (cm) 3 Width: (cm) 2.8 Depth: (cm) 0.1 Area: (cm) 6.597 Volume: (cm) 0.66 % Reduction in Area: 66.3% % Reduction in Volume: 66.3% Tunneling: No Undermining: No Wound Description Full Thickness Without Exposed Support Foul Classification: Structures Sloug Exudate Medium Amount: Exudate Type: Serous Exudate Color: amber Odor After Cleansing: No h/Fibrino Yes Wound Bed Granulation Amount: Small (1-33%) Exposed Structure Necrotic Amount: Large (67-100%) Fascia Exposed: No Necrotic Quality: Adherent Slough Fat Layer (Subcutaneous Tissue) Exposed: Yes Tendon Exposed: No Muscle Exposed: No Joint Exposed: No Bone Exposed: No Urquiza, Ransome S. ([id number]) Periwound Skin Texture Texture Color No Abnormalities Noted: No No Abnormalities Noted: No Callus: No Atrophie Blanche: No Crepitus: No Cyanosis: No Excoriation: No Ecchymosis: No Induration: No Erythema: No Rash: No Hemosiderin Staining: No Scarring: No Mottled: No Pallor: No Moisture Rubor: No No Abnormalities Noted: No Dry / Scaly: No Maceration: No Wound Preparation Ulcer Cleansing: Rinsed/Irrigated with Saline,  Other: soap and water, Topical Anesthetic Applied: Other: lidocaine 4%, Electronic Signature(s) Signed: 02/13/2018 3:39:37 PM By: Secundino Ginger Entered By: Secundino Ginger on  02/13/2018 14:04:12 Berres, FRAZIER BALFOUR ([id number]) -------------------------------------------------------------------------------- Wound Assessment Details Patient Name: [name], [name] MONCRIEF. Date of Service: 02/13/2018 1:30 PM Medical Record Number: [id number] Patient Account Number: [id number] Date of Birth/Sex: March 04, 1924 (85 y.o. M) Treating RN: Secundino Ginger Primary Care Fani Rotondo: Deborra Medina Other Clinician: Referring Glenyce Randle: Deborra Medina Treating Liany Mumpower/Extender: Melburn Hake, HOYT Weeks in Treatment: 18 Wound Status Wound Number: 8 Primary Arterial Insufficiency Ulcer Etiology: Wound Location: Left Malleolus - Medial Wound Healed - Epithelialized Wounding Event: Gradually Appeared Status: Date Acquired: 12/29/2017 Comorbid Cataracts, Lymphedema, Coronary Artery Weeks Of Treatment: 5 History: Disease, Hypertension, Peripheral Venous Clustered Wound: No Disease, Type II Diabetes, End Stage Renal Disease, Rheumatoid Arthritis, Neuropathy Photos Photo Uploaded By: Secundino Ginger on 02/13/2018 14:31:49 Wound Measurements Length: (cm) Width: (cm) Depth: (cm) Area: (cm) Volume: (cm) 0 % Reduction in Area: 100% 0 % Reduction in Volume: 100% 0 0 0 Wound Description Full Thickness Without Exposed Support Classification: Structures Periwound Skin Texture Texture Color No Abnormalities Noted: No No Abnormalities Noted: No Moisture No Abnormalities Noted: No Electronic Signature(s) Signed: 02/13/2018 3:39:37 PM By: Secundino Ginger Entered By: Secundino Ginger on 02/13/2018 14:07:40 Chizmar, [name] ([id number]) -------------------------------------------------------------------------------- Wound Assessment Details Patient Name: Cocking, Chalmer S. Date of Service: 02/13/2018 1:30 PM Medical Record Number:  [id number] Patient Account Number: [id number] Date of Birth/Sex: [birth year] (85 y.o. M) Treating RN: Secundino Ginger Primary Care Mecca Guitron: Deborra Medina Other Clinician: Referring Calene Paradiso: Deborra Medina Treating Felissa Blouch/Extender: Melburn Hake, HOYT Weeks in Treatment: 18 Wound Status Wound Number: 9 Primary Diabetic Wound/Ulcer of the Lower Extremity Etiology: Wound Location: Left Lower Leg - Medial, Distal Wound Open Wounding Event: Gradually Appeared Status: Date Acquired: 02/01/2018 Comorbid Cataracts, Lymphedema, Coronary Artery Weeks Of Treatment: 0 History: Disease, Hypertension, Peripheral Venous Clustered Wound: No Disease, Type II Diabetes, End Stage Renal Disease, Rheumatoid Arthritis, Neuropathy Photos Photo Uploaded By: Secundino Ginger on 02/13/2018 14:32:20 Wound Measurements Length: (cm) 7.5 % Reduc Width: (cm) 10.3 % Reduc Depth: (cm) 0.1 Tunneli Area: (cm) 60.672 Underm Volume: (cm) 6.067 tion in Area: tion in Volume: ng: No ining: No Wound Description Classification: Grade 2 Exudate Amount: Large Exudate Type: Serous Exudate Color: amber Foul Odor After Cleansing: No Slough/Fibrino Yes Wound Bed Granulation Amount: Small (1-33%) Exposed Structure Granulation Quality: Pink, Pale, Hyper-granulation Fascia Exposed: No Necrotic Amount: Large (67-100%) Fat Layer (Subcutaneous Tissue) Exposed: Yes Necrotic Quality: Adherent Slough Tendon Exposed: No Muscle Exposed: No Joint Exposed: No Bone Exposed: No Periwound Skin Texture Biglow, Keijuan S. ([id number]) Texture Color No Abnormalities Noted: No No Abnormalities Noted: No Callus: No Atrophie Blanche: No Crepitus: No Cyanosis: No Excoriation: No Ecchymosis: No Induration: No Erythema: No Rash: No Hemosiderin Staining: No Scarring: No Mottled: No Pallor: No Moisture Rubor: No No Abnormalities Noted: No Dry / Scaly: No Maceration: No Wound Preparation Ulcer Cleansing: Rinsed/Irrigated with  Saline, Other: soap and water, Topical Anesthetic Applied: Other: lidocaine 4%, Electronic Signature(s) Signed: 02/13/2018 3:39:37 PM By: Secundino Ginger Entered By: Secundino Ginger on 02/13/2018 14:11:46 Madlock, [name] ([id number]) -------------------------------------------------------------------------------- [place] Details Patient Name: Roa, Jarold S. Date of Service: 02/13/2018 1:30 PM Medical Record Number: [id number] Patient Account Number: [id number] Date of Birth/Sex: 1924-08-28 (85 y.o. M) Treating RN: Montey Hora Primary Care Micheala Morissette: Deborra Medina Other Clinician: Referring Audray Rumore: Deborra Medina Treating Regis Hinton/Extender: Melburn Hake, HOYT Weeks in Treatment: 18 Vital Signs Time Taken: 13:30 Temperature (F): 97.7 Height (in): 68 Pulse (bpm): 69 Weight (lbs): 180 Respiratory Rate (breaths/min): 16 Body Mass Index (BMI): 27.4 Blood Pressure (mmHg): 132/55 Reference Range: 80 -  120 mg / dl Electronic Signature(s) Signed: 02/13/2018 3:31:07 PM By: Lorine Bears RCP, RRT, CHT Entered By: Lorine Bears on 02/13/2018 13:33:16

## 2018-02-16 NOTE — Progress Notes (Addendum)
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 actually appears to be doing about the same in regard to his foot ulcers. Overall I really do not see a significant amount of improvement. He still has sinuses to some degree in the left foot in particular the right is a little better. I had last week spoken with [facility] in the care of his surgical site they do want to transfer to [place] as well this is the left third toe amputation. Nonetheless I did have a conversation with the patient today discussing options as far as treatment is concerned. He does not really want to consider any further amputation as he wants to try to maintain mobility as much as possible. For that reason we're gonna maintain more palliative wound care at this point trying to do the best we can as far as treating the ulcers with the knowledge that due to his blood flow that may be difficult to get any of these areas to heal. 12/26/17 on evaluation today patient actually presents for follow-up concerning his bilateral lower extremity ulcers. At this time it does not appear that the patient has any additional overall worsening at the surgical site of the left third toe. With that being said he does have a lot of necrotic tissue overlying the surface of this region that I think a careful selective debridement would benefit. Obviously I'm not gonna recommend anything too aggressive at the site just due to his poor microvascular blood flow. Nonetheless if we have any chance of getting this to heal I think some careful debridement is going to be necessary. He has several blister areas unfortunately due to his swelling that is occurring. We are avoiding compression stockings at this time being that with his poor microvascular blood flow the last time he had these on everything turned a very poor blue color. The area that I marked to watch out for the red spreading of his foot does not appear to have caused any issues nor spread any further than what I saw  during his office visit. Overall I'm extremely pleased with how things have progressed on not concerned as much today as I have been in the past with my visits. His daughter Kathlee Nations is present during the evaluation today. 01/08/18 on evaluation today patient appears to be doing a little worse all around in regard to his wounds. The right foot ulcers both on the posterior aspect of his Achilles region as well as the dorsal surface his foot both appear to be very dry. There are some areas that are drying out on the left foot as well but to be honest in most cases this  is something that we probably want to happen in that regard. Again we have throughout the past several [name] and months discuss the possibility of invitation versus not. It is possible that he might would have to even have a bilateral below knee amputation if it proceeded to that degree. Nonetheless the patient up to this point has not wanted to even go down that road I'm definitely in agreement with honoring his wishes although I discussed with him again today that if he decides he would like to talk to someone from a surgical standpoint regarding this I would be more than happy to make the referral for him. 01/15/18 on evaluation today patient appears to be doing a little better in my pinion compared to the last evaluation. Things seem to at least be stable currently. He does have an appointment on the 31st with [name]. Nonetheless he is not really Coury, JUANJOSE MOJICA. ([id number]) interested in any application surgeries at this point he wants to try to maintain function as much as possible. With that being said we've had multiple discussions about how things could go poorly rather quickly in light of his current condition. He is aware of this as is his family. His daughter is present during the evaluation today. 01/30/18 Seen today for multiple ulcers to bilateral feet. He is having more pain than last visit. Per pt's nurse aid, he has  not been walking as much as usual and using his wheelchair. Exam is limited today as he is unable to tolerate any movement or touching of LE. Left foot Wounds have a fair amount of necrotic tissue with adherent slough. He wound benefit from surgical intervention. Discussed the risk of infection with Mr. Leonides Schanz and his daughter Deneise Lever. He declines any surgery. At this time during visit there is no acute infection. 02/13/18 on evaluation today patient's wounds appear to be doing all at all about the same to me. He really does not seem to show any signs of improvement since my last evaluation with him which is unfortunate though not unexpected. He does have a few areas with their new blisters unfortunately there causing more complication and problems. Overall my biggest concern at this point is simply that I do not foresee these areas getting better I think he's gonna continue to have trouble healing due to his microvascular status. Patient History Information obtained from Patient. Social History Former smoker, Marital Status - Widowed, Alcohol Use - Rarely, Drug Use - No History, Caffeine Use - Never. Medical And Surgical History Notes Cardiovascular pacemaker, 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 leg, stent left leg Review of Systems (ROS) Constitutional Symptoms (General Health) Denies complaints or symptoms of Fever, Chills. Respiratory The patient has no complaints or symptoms. Cardiovascular The patient has no complaints or symptoms. Psychiatric The patient has no complaints or symptoms. Objective Constitutional Well-nourished and well-hydrated in no acute distress. Vitals Time Taken: 1:30 PM, Height: 68 in, Weight: 180 lbs, BMI: 27.4, Temperature: 97.7 F, Pulse: 69 bpm, Respiratory Rate: 16 breaths/min, Blood Pressure: 132/55 mmHg. Respiratory normal breathing without difficulty. clear to auscultation bilaterally. Cardiovascular regular rate and rhythm with normal S1, S2. Absent posterior  tibial and dorsalis pedis pulses bilateral lower extremities. Cecere, TRAVEION RUDDOCK ([id number]) Psychiatric this patient is able to make decisions and demonstrates good insight into disease process. Alert and Oriented x 3. pleasant and cooperative. General Notes: Patient's wounds currently show evidence of significant slough buildup but unfortunately I cannot perform any sharp debridement secondary to the fact that  again he has poor microvascular blood flow in the distal extremities. No fevers, chills, nausea, or vomiting noted at this time. Integumentary (Hair, Skin) Wound #1 status is Open. Original cause of wound was Gradually Appeared. The wound is located on the Right Toe Second. The wound measures 0.7cm length x 0.5cm width x 0.2cm depth; 0.275cm^2 area and 0.055cm^3 volume. There is Fat Layer (Subcutaneous Tissue) Exposed exposed. There is no tunneling or undermining noted. There is a small amount of serous drainage noted. There is no granulation within the wound bed. There is a small (1-33%) amount of necrotic tissue within the wound bed including Adherent Slough. The periwound skin appearance did not exhibit: Callus, Crepitus, Excoriation, Induration, Rash, Scarring, Dry/Scaly, Maceration, Atrophie Blanche, Cyanosis, Ecchymosis, Hemosiderin Staining, Mottled, Pallor, Rubor, Erythema. The periwound has tenderness on palpation. Wound #3 status is Open. Original cause of wound was Not Known. The wound is located on the Left,Lateral Toe Fifth. The wound measures 1cm length x 1.5cm width x 0.1cm depth; 1.178cm^2 area and 0.118cm^3 volume. There is Fat Layer (Subcutaneous Tissue) Exposed exposed. There is no tunneling or undermining noted. There is a small amount of serosanguineous drainage noted. There is no granulation within the wound bed. There is a large (67-100%) amount of necrotic tissue within the wound bed including Eschar. The periwound skin appearance did not exhibit: Callus,  Crepitus, Excoriation, Induration, Rash, Scarring, Dry/Scaly, Maceration, Atrophie Blanche, Cyanosis, Ecchymosis, Hemosiderin Staining, Mottled, Pallor, Rubor, Erythema. Wound #4 status is Open. Original cause of wound was Trauma. The wound is located on the Left,Medial,Dorsal Foot. The wound measures 2.6cm length x 1.5cm width x 0.3cm depth; 3.063cm^2 area and 0.919cm^3 volume. There is Fat Layer (Subcutaneous Tissue) Exposed exposed. There is no tunneling or undermining noted. There is a medium amount of serous drainage noted. There is small (1-33%) granulation within the wound bed. There is a large (67-100%) amount of necrotic tissue within the wound bed including Eschar and Adherent Slough. The periwound skin appearance did not exhibit: Callus, Crepitus, Excoriation, Induration, Rash, Scarring, Dry/Scaly, Maceration, Atrophie Blanche, Cyanosis, Ecchymosis, Hemosiderin Staining, Mottled, Pallor, Rubor, Erythema. Wound #5 status is Open. Original cause of wound was Surgical Injury. The wound is located on the Left Toe Third. The wound measures 3.2cm length x 1.4cm width x 0.3cm depth; 3.519cm^2 area and 1.056cm^3 volume. There is Fat Layer (Subcutaneous Tissue) Exposed exposed. There is no tunneling or undermining noted. There is a small amount of purulent drainage noted. There is no granulation within the wound bed. There is a large (67-100%) amount of necrotic tissue within the wound bed including Eschar and Adherent Slough. The periwound skin appearance did not exhibit: Callus, Crepitus, Excoriation, Induration, Rash, Scarring, Dry/Scaly, Maceration, Atrophie Blanche, Cyanosis, Ecchymosis, Hemosiderin Staining, Mottled, Pallor, Rubor, Erythema. Wound #6 status is Open. Original cause of wound was Not Known. The wound is located on the Right,Dorsal Foot. The wound measures 3cm length x 2.8cm width x 0.1cm depth; 6.597cm^2 area and 0.66cm^3 volume. There is Fat Layer (Subcutaneous Tissue)  Exposed exposed. There is no tunneling or undermining noted. There is a medium amount of serous drainage noted. There is small (1-33%) granulation within the wound bed. There is a large (67-100%) amount of necrotic tissue within the wound bed including Adherent Slough. The periwound skin appearance did not exhibit: Callus, Crepitus, Excoriation, Induration, Rash, Scarring, Dry/Scaly, Maceration, Atrophie Blanche, Cyanosis, Ecchymosis, Hemosiderin Staining, Mottled, Pallor, Rubor, Erythema. Wound #8 status is Healed - Epithelialized. Original cause of wound was Gradually Appeared. The wound  is located on the Left,Medial Malleolus. The wound measures 0cm length x 0cm width x 0cm depth; 0cm^2 area and 0cm^3 volume. Wound #9 status is Open. Original cause of wound was Gradually Appeared. The wound is located on the Left,Distal,Medial Lower Leg. The wound measures 7.5cm length x 10.3cm width x 0.1cm depth; 60.672cm^2 area and 6.067cm^3 volume. There is Fat Layer (Subcutaneous Tissue) Exposed exposed. There is no tunneling or undermining noted. There is a large amount of serous drainage noted. There is small (1-33%) pink, pale, hyper - granulation within the wound bed. There is a large (67-100%) amount of necrotic tissue within the wound bed including Adherent Slough. The periwound skin appearance did not exhibit: Callus, Crepitus, Excoriation, Induration, Rash, Scarring, Dry/Scaly, Maceration, Atrophie Blanche, Cyanosis, Szatkowski, Henry S. ([id number]) Ecchymosis, Hemosiderin Staining, Mottled, Pallor, Rubor, Erythema. 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 Clean wound with Normal Saline. May Shower, gently pat wound dry prior to applying new dressing. May Shower, gently pat wound dry prior to applying new dressing. Wound #3 Left,Lateral Toe Fifth: Clean wound with Normal Saline. May Shower, gently pat wound dry prior to applying new dressing. Wound #4 Left,Medial,Dorsal Foot: Clean wound with Normal Saline. May Shower, gently pat wound dry prior to applying new dressing. Wound #5 Left Toe Third: Clean wound with Normal Saline. May Shower, gently pat wound dry prior to applying new dressing. Wound #6 Right,Dorsal Foot: Clean wound with Normal Saline. May Shower, gently pat wound dry prior to applying new dressing. Primary Wound Dressing: Wound #1 Right Toe Second: Hydrafera Blue Ready Transfer Wound #3 Left,Lateral Toe Fifth: Hydrafera Blue Ready Transfer Wound #4 Left,Medial,Dorsal Foot: Hydrafera Blue Ready Transfer Wound #5 Left Toe Third: Hydrafera Blue Ready Transfer Wound #6 Right,Dorsal Foot: Hydrafera Blue Ready Transfer Secondary Dressing: Wound #1 Right Toe Second: Other - Coverlet Whitt, [place] ([id number]) Wound #3 Left,Lateral Toe Fifth: Gauze and Kerlix/Conform Wound #4 Left,Medial,Dorsal Foot: Gauze and Kerlix/Conform Wound #6 Right,Dorsal Foot: Gauze and Kerlix/Conform Dressing Change Frequency: Wound #1 Right Toe Second: Change Dressing Monday, Wednesday, Friday - HOMEHEALTH-Monday, Wednesday, Friday Wound #3 Left,Lateral Toe Fifth: Change Dressing Monday, Wednesday, Friday - HOMEHEALTH-Monday, Wednesday, Friday Other: - Apply Lidocaine 4% to wounds 15-20 minutes prior to wound care, Monday Wednesday and Friday. Wound #4 Left,Medial,Dorsal Foot: Change dressing every day. - HHRN to change dressing three times weekly and [facility] will change dressing once and family will attempt to change dressing the other days Wound #6 Right,Dorsal Foot: Change Dressing Monday, Wednesday, Friday - HOMEHEALTH-Monday, Wednesday, Friday Follow-up Appointments: Wound #1 Right Toe Second: Return Appointment in 2 [name]. Wound #3 Left,Lateral Toe Fifth: Return Appointment in 2 [name]. Wound #4 Left,Medial,Dorsal Foot: Return Appointment in 2 [name]. Wound #5 Left Toe Third: Return Appointment in 2 [name]. Wound #6 Right,Dorsal Foot: Return Appointment in 2 [name]. Edema Control: Other: - Tubi grip to both legs Home Health: Wound #1 Right Toe Second: Continue Home Health Visits - [place]: Monday, Wednesday and Friday Home Health Nurse may visit PRN to address patient s wound care needs. FACE TO FACE ENCOUNTER:  MEDICARE and MEDICAID PATIENTS: I certify that this patient is under my care and that I had a face-to-face encounter that meets the physician face-to-face encounter requirements with this patient on this date. The encounter with the patient was in whole or in part for the following MEDICAL CONDITION: (primary reason for [place]) MEDICAL NECESSITY: I certify, that based on my findings, NURSING services are a medically necessary home health service. HOME BOUND STATUS: I certify that my clinical findings support that this patient is homebound (i.e., Due to illness or injury, pt requires aid of supportive devices such as crutches, cane, wheelchairs, walkers, the use of special transportation or the assistance of another person to leave their place of residence. There is a normal inability to leave the home and doing so requires considerable and taxing effort. Other absences are for medical reasons / religious services and are infrequent or of short duration when for other reasons). If current dressing causes regression in wound condition, may D/C ordered dressing product/s and apply Normal Saline Moist Dressing  daily until next [place] / Other MD appointment. [place] of regression in wound condition at [phone]. Please direct any NON-WOUND related issues/requests for orders to patient's Primary Care Physician Wound #3 Left,Lateral Toe Fifth: [place] Visits - [facility]: Monday, Wednesday and Friday Home Health Nurse may visit PRN to address patient s wound care needs. FACE TO FACE ENCOUNTER: MEDICARE and MEDICAID PATIENTS: I certify that this patient is under my care and that I had a face-to-face encounter that meets the physician face-to-face encounter requirements with this patient on this date. The encounter with the patient was in whole or in part for the following MEDICAL CONDITION: (primary reason for [place]) MEDICAL NECESSITY: I certify, that based on my findings, NURSING services are a medically necessary home health service. HOME BOUND STATUS: I certify that my clinical findings support that this patient is homebound (i.e., Due to illness or injury, pt requires aid of supportive devices such as crutches, cane, wheelchairs, walkers, the use of special transportation or the assistance of another person to leave their place of residence. There is a normal inability to leave the home and doing so requires considerable and taxing effort. Other absences are for medical reasons / religious services and are infrequent or of short duration when for other reasons). If current dressing causes regression in wound condition, may D/C ordered dressing product/s and apply Normal Saline Wojciak, Timithy S. ([id number]) Moist Dressing daily until next [place] / Other MD appointment. [place] of regression in wound condition at [phone]. Please direct any NON-WOUND related issues/requests for orders to patient's Primary Care Physician Wound #4 Left,Medial,Dorsal Foot: [place] Visits - [facility]: Monday,  Wednesday and Friday Home Health Nurse may visit PRN to address patient s wound care needs. FACE TO FACE ENCOUNTER: MEDICARE and MEDICAID PATIENTS: I certify that this patient is under my care and that I had a face-to-face encounter that meets the physician face-to-face encounter requirements with this patient on this date. The encounter with the patient was in whole or in part for the following MEDICAL CONDITION: (primary reason for [place]) MEDICAL NECESSITY: I certify, that based on my findings, NURSING services are a medically necessary home health service. HOME BOUND STATUS: I certify that my clinical findings support that this patient is homebound (i.e., Due to illness or injury, pt requires aid of supportive devices such as crutches, cane, wheelchairs, walkers, the use  of special transportation or the assistance of another person to leave their place of residence. There is a normal inability to leave the home and doing so requires considerable and taxing effort. Other absences are for medical reasons / religious services and are infrequent or of short duration when for other reasons). If current dressing causes regression in wound condition, may D/C ordered dressing product/s and apply Normal Saline Moist Dressing daily until next [place] / Other MD appointment. [place] of regression in wound condition at [phone]. Please direct any NON-WOUND related issues/requests for orders to patient's Primary Care Physician Wound #6 Right,Dorsal Foot: [place] Visits - [facility]: Monday, Wednesday and Friday Home Health Nurse may visit PRN to address patient s wound care needs. FACE TO FACE ENCOUNTER: MEDICARE and MEDICAID PATIENTS: I certify that this patient is under my care and that I had a face-to-face encounter that meets the physician face-to-face encounter requirements with this patient on this date. The encounter with the patient was in  whole or in part for the following MEDICAL CONDITION: (primary reason for [place]) MEDICAL NECESSITY: I certify, that based on my findings, NURSING services are a medically necessary home health service. HOME BOUND STATUS: I certify that my clinical findings support that this patient is homebound (i.e., Due to illness or injury, pt requires aid of supportive devices such as crutches, cane, wheelchairs, walkers, the use of special transportation or the assistance of another person to leave their place of residence. There is a normal inability to leave the home and doing so requires considerable and taxing effort. Other absences are for medical reasons / religious services and are infrequent or of short duration when for other reasons). If current dressing causes regression in wound condition, may D/C ordered dressing product/s and apply Normal Saline Moist Dressing daily until next [place] / Other MD appointment. [place] of regression in wound condition at [phone]. Please direct any NON-WOUND related issues/requests for orders to patient's Primary Care Physician Medications-please add to medication list.: Other: - Apply Lidocaine 4% to wounds 15-20 minutes prior to wound care, Monday Wednesday and Friday. Again I explained to the patient that mainly we are just trying to keep things as safe as possible at this point. Fortunately he does not seem to show any signs of infection right now but as I've explained to him many times previous this could go downhill very quickly and he needs to be diligent in watching out for any significant worsening. He states he understands. Subsequently we're gonna see were things stand at follow-up. Please see above for specific wound care orders. We will see patient for re-evaluation in 2 week(s) here in the clinic. If anything worsens or changes patient will contact our office for additional recommendations. Electronic  Signature(s) Signed: 02/22/2018 1:38:17 AM By: Worthy Keeler PA-C Entered By: Worthy [name] on 02/22/2018 01:22:13 [place], [name] ([id number]) -------------------------------------------------------------------------------- ROS/PFSH Details Patient Name: Macomber, Sirron S. Date of Service: 02/13/2018 1:30 PM Medical Record Number: [id number] Patient Account Number: [id number] Date of Birth/Sex: 11-15-1924 (85 y.o. M) Treating RN: [name] Primary Care Provider: Deborra [name] Other Clinician: Referring Provider: Deborra [name] Treating Provider/Extender: [name], [name] in Treatment: 63 Information Obtained From Patient Wound History Constitutional Symptoms (General Health) Complaints and Symptoms: Negative for: Fever; Chills Eyes Medical History: Positive for: Cataracts Negative for: Glaucoma; Optic Neuritis Ear/Nose/Mouth/Throat Medical History: Negative for: Chronic sinus problems/congestion; Middle ear problems Hematologic/Lymphatic Medical History:  Positive for: Lymphedema Negative for: Anemia; Hemophilia; Human Immunodeficiency Virus; Sickle Cell Disease Respiratory Complaints and Symptoms: No Complaints or Symptoms Medical History: Negative for: Aspiration; Asthma; Chronic Obstructive Pulmonary Disease (COPD); Pneumothorax; Sleep Apnea; Tuberculosis Cardiovascular Complaints and Symptoms: No Complaints or Symptoms Medical History: Positive for: Coronary Artery Disease; Hypertension; Peripheral Venous Disease Negative for: Angina; Arrhythmia; Congestive Heart Failure; Deep Vein Thrombosis; Hypotension; Myocardial Infarction; Peripheral Arterial Disease; Phlebitis; Vasculitis Past Medical History Notes: pacemaker, 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 leg, stent left leg Gastrointestinal [place], Stonewall S. ([id number]) Medical History: Negative for: Cirrhosis ; Colitis; Crohnos; Hepatitis A; Hepatitis B; Hepatitis C Endocrine Medical History: Positive for:  Type II Diabetes - since 1988 Negative for: Type I Diabetes Treated with: Insulin Blood sugar tested every day: Yes Tested : 3 times daily Blood sugar testing results: Breakfast: 114 Genitourinary Medical History: Positive for: End Stage Renal Disease Immunological Medical History: Negative for: Lupus Erythematosus; Raynaudos; Scleroderma Integumentary (Skin) Medical History: Negative for: History of Burn; History of pressure wounds Musculoskeletal Medical History: Positive for: Rheumatoid Arthritis Negative for: Gout; Osteoarthritis; Osteomyelitis Neurologic Medical History: Positive for: Neuropathy - feet and legs Negative for: Dementia; Quadriplegia; Paraplegia; Seizure Disorder Psychiatric Complaints and Symptoms: No Complaints or Symptoms Medical History: Negative for: Anorexia/bulimia; Confinement Anxiety HBO Extended History Items Eyes: Cataracts Immunizations Pneumococcal Vaccine: Received Pneumococcal Vaccination: No Implantable Devices Brunn, MINDY BEHNKEN ([id number]) Family and Social History Former smoker; Marital Status - Widowed; Alcohol Use: Rarely; Drug Use: No History; Caffeine Use: Never; Financial Concerns: No; Food, Clothing or Shelter Needs: No; Support System Lacking: No; Transportation Concerns: No; Advanced Directives: Yes (Not Provided); Patient does not want information on Advanced Directives; Do not resuscitate: No; Living Will: Yes (Not Provided); Medical Power of Attorney: Yes (Not Provided) Physician Affirmation I have reviewed and agree with the above information. Electronic Signature(s) Signed: 02/22/2018 1:38:17 AM By: Worthy Keeler PA-C Signed: 04/13/2018 10:45:26 AM By: [name] Entered By: Worthy [name] on 02/22/2018 01:20:58 [place], [name] ([id number]) -------------------------------------------------------------------------------- [place] Details Patient Name: Ganus, Amine S. Date of Service: 02/13/2018 Medical  Record Number: [id number] Patient Account Number: [id number] Date of Birth/Sex: [birth year] (85 y.o. 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 N18.9 Chronic kidney disease, unspecified I73.9 Peripheral vascular disease, unspecified B58.30 Chronic systolic (congestive) heart failure I10 Essential (primary) hypertension Facility Procedures CPT4 Code: [id number] Description: 81103 - WOUND CARE VISIT-LEV 5 EST PT Modifier: Quantity: 1 Physician Procedures CPT4: Description Modifier Quantity Code [id number] - WC PHYS LEVEL 4 - EST PT 1 ICD-10 Diagnosis Description E11.621 Type 2 diabetes mellitus with foot ulcer E11.40 Type 2 diabetes mellitus with diabetic neuropathy, unspecified L97.512 Non-pressure  chronic ulcer of other part of right foot with fat layer exposed 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

## 2018-02-18 DIAGNOSIS — L97822 Non-pressure chronic ulcer of other part of left lower leg with fat layer exposed: Secondary | ICD-10-CM | POA: Diagnosis not present

## 2018-02-18 DIAGNOSIS — L97513 Non-pressure chronic ulcer of other part of right foot with necrosis of muscle: Secondary | ICD-10-CM | POA: Diagnosis not present

## 2018-02-18 DIAGNOSIS — E11621 Type 2 diabetes mellitus with foot ulcer: Secondary | ICD-10-CM | POA: Diagnosis not present

## 2018-02-18 DIAGNOSIS — I70242 Atherosclerosis of native arteries of left leg with ulceration of calf: Secondary | ICD-10-CM | POA: Diagnosis not present

## 2018-02-18 DIAGNOSIS — I70235 Atherosclerosis of native arteries of right leg with ulceration of other part of foot: Secondary | ICD-10-CM | POA: Diagnosis not present

## 2018-02-18 DIAGNOSIS — E1152 Type 2 diabetes mellitus with diabetic peripheral angiopathy with gangrene: Secondary | ICD-10-CM | POA: Diagnosis not present

## 2018-02-20 ENCOUNTER — Telehealth: Payer: Self-pay | Admitting: Internal Medicine

## 2018-02-20 DIAGNOSIS — L97822 Non-pressure chronic ulcer of other part of left lower leg with fat layer exposed: Secondary | ICD-10-CM | POA: Diagnosis not present

## 2018-02-20 DIAGNOSIS — L97513 Non-pressure chronic ulcer of other part of right foot with necrosis of muscle: Secondary | ICD-10-CM | POA: Diagnosis not present

## 2018-02-20 DIAGNOSIS — I70242 Atherosclerosis of native arteries of left leg with ulceration of calf: Secondary | ICD-10-CM | POA: Diagnosis not present

## 2018-02-20 DIAGNOSIS — I70235 Atherosclerosis of native arteries of right leg with ulceration of other part of foot: Secondary | ICD-10-CM | POA: Diagnosis not present

## 2018-02-20 DIAGNOSIS — E1152 Type 2 diabetes mellitus with diabetic peripheral angiopathy with gangrene: Secondary | ICD-10-CM | POA: Diagnosis not present

## 2018-02-20 DIAGNOSIS — E11621 Type 2 diabetes mellitus with foot ulcer: Secondary | ICD-10-CM | POA: Diagnosis not present

## 2018-02-20 NOTE — Telephone Encounter (Signed)
If the patient doesn't follow instructions about wound washing it will get bigger and it will become infected.  Let us know if it starts to have an odor  To the  drainage.  . As long as it doesn't look like it is  He needs to follow up with Vascular if the foot is more cool.

## 2018-02-20 NOTE — Telephone Encounter (Signed)
Danae Chen, LPN with [facility] called to report the patient's left medial multi-mid lower leg venous stasis ulcer is getting worse, spreading bigger than it was earlier in the week. She also says the patient's left foot was cooler to the touch, but the patient has feeling when she touched the bottom of the foot and it has color to the foot. She says she advised the patient to take sink baths, but he had gotten in the shower and the dressing was saturated. I advised I will send to the provider for review.

## 2018-02-20 NOTE — Telephone Encounter (Signed)
I have informed [name].  She will pass the information onto the patient and his daughter and son.  She did state that patient had a faint smell to the drainage.  I did inform her that patient can call in for triage in regard to scheduling for [facility].

## 2018-02-22 DIAGNOSIS — L97519 Non-pressure chronic ulcer of other part of right foot with unspecified severity: Secondary | ICD-10-CM | POA: Diagnosis present

## 2018-02-22 DIAGNOSIS — I2510 Atherosclerotic heart disease of native coronary artery without angina pectoris: Secondary | ICD-10-CM | POA: Diagnosis not present

## 2018-02-22 DIAGNOSIS — N179 Acute kidney failure, unspecified: Secondary | ICD-10-CM | POA: Diagnosis not present

## 2018-02-22 DIAGNOSIS — L03115 Cellulitis of right lower limb: Secondary | ICD-10-CM | POA: Diagnosis not present

## 2018-02-22 DIAGNOSIS — E1151 Type 2 diabetes mellitus with diabetic peripheral angiopathy without gangrene: Secondary | ICD-10-CM | POA: Diagnosis present

## 2018-02-22 DIAGNOSIS — N189 Chronic kidney disease, unspecified: Secondary | ICD-10-CM | POA: Diagnosis not present

## 2018-02-22 DIAGNOSIS — L03116 Cellulitis of left lower limb: Secondary | ICD-10-CM | POA: Diagnosis present

## 2018-02-22 DIAGNOSIS — Z89422 Acquired absence of other left toe(s): Secondary | ICD-10-CM | POA: Diagnosis not present

## 2018-02-22 DIAGNOSIS — E1169 Type 2 diabetes mellitus with other specified complication: Secondary | ICD-10-CM | POA: Diagnosis present

## 2018-02-22 DIAGNOSIS — Z89421 Acquired absence of other right toe(s): Secondary | ICD-10-CM | POA: Diagnosis not present

## 2018-02-22 DIAGNOSIS — L89312 Pressure ulcer of right buttock, stage 2: Secondary | ICD-10-CM | POA: Diagnosis present

## 2018-02-22 DIAGNOSIS — D649 Anemia, unspecified: Secondary | ICD-10-CM | POA: Diagnosis not present

## 2018-02-22 DIAGNOSIS — E11628 Type 2 diabetes mellitus with other skin complications: Secondary | ICD-10-CM | POA: Diagnosis not present

## 2018-02-22 DIAGNOSIS — Z66 Do not resuscitate: Secondary | ICD-10-CM | POA: Diagnosis present

## 2018-02-22 DIAGNOSIS — R5381 Other malaise: Secondary | ICD-10-CM | POA: Diagnosis not present

## 2018-02-22 DIAGNOSIS — E1122 Type 2 diabetes mellitus with diabetic chronic kidney disease: Secondary | ICD-10-CM | POA: Diagnosis present

## 2018-02-22 DIAGNOSIS — T8789 Other complications of amputation stump: Secondary | ICD-10-CM | POA: Diagnosis present

## 2018-02-22 DIAGNOSIS — L97529 Non-pressure chronic ulcer of other part of left foot with unspecified severity: Secondary | ICD-10-CM | POA: Diagnosis present

## 2018-02-22 DIAGNOSIS — D509 Iron deficiency anemia, unspecified: Secondary | ICD-10-CM | POA: Diagnosis present

## 2018-02-22 DIAGNOSIS — R9431 Abnormal electrocardiogram [ECG] [EKG]: Secondary | ICD-10-CM | POA: Diagnosis not present

## 2018-02-22 DIAGNOSIS — E11649 Type 2 diabetes mellitus with hypoglycemia without coma: Secondary | ICD-10-CM | POA: Diagnosis not present

## 2018-02-22 DIAGNOSIS — E1142 Type 2 diabetes mellitus with diabetic polyneuropathy: Secondary | ICD-10-CM | POA: Diagnosis present

## 2018-02-22 DIAGNOSIS — M868X6 Other osteomyelitis, lower leg: Secondary | ICD-10-CM | POA: Diagnosis present

## 2018-02-22 DIAGNOSIS — E11621 Type 2 diabetes mellitus with foot ulcer: Secondary | ICD-10-CM | POA: Diagnosis present

## 2018-02-22 DIAGNOSIS — T8140XA Infection following a procedure, unspecified, initial encounter: Secondary | ICD-10-CM | POA: Diagnosis not present

## 2018-02-22 DIAGNOSIS — S81802A Unspecified open wound, left lower leg, initial encounter: Secondary | ICD-10-CM | POA: Diagnosis not present

## 2018-02-22 DIAGNOSIS — Z794 Long term (current) use of insulin: Secondary | ICD-10-CM | POA: Diagnosis not present

## 2018-02-22 DIAGNOSIS — S81801A Unspecified open wound, right lower leg, initial encounter: Secondary | ICD-10-CM | POA: Diagnosis not present

## 2018-02-22 DIAGNOSIS — F05 Delirium due to known physiological condition: Secondary | ICD-10-CM | POA: Diagnosis not present

## 2018-02-22 DIAGNOSIS — M6281 Muscle weakness (generalized): Secondary | ICD-10-CM | POA: Diagnosis not present

## 2018-02-22 DIAGNOSIS — E668 Other obesity: Secondary | ICD-10-CM | POA: Diagnosis present

## 2018-02-22 DIAGNOSIS — L089 Local infection of the skin and subcutaneous tissue, unspecified: Secondary | ICD-10-CM | POA: Diagnosis not present

## 2018-02-22 DIAGNOSIS — R279 Unspecified lack of coordination: Secondary | ICD-10-CM | POA: Diagnosis not present

## 2018-02-22 DIAGNOSIS — J439 Emphysema, unspecified: Secondary | ICD-10-CM | POA: Diagnosis not present

## 2018-02-22 DIAGNOSIS — Z515 Encounter for palliative care: Secondary | ICD-10-CM | POA: Diagnosis not present

## 2018-02-22 DIAGNOSIS — N183 Chronic kidney disease, stage 3 (moderate): Secondary | ICD-10-CM | POA: Diagnosis present

## 2018-02-22 DIAGNOSIS — I739 Peripheral vascular disease, unspecified: Secondary | ICD-10-CM | POA: Diagnosis not present

## 2018-02-22 DIAGNOSIS — Z6827 Body mass index (BMI) 27.0-27.9, adult: Secondary | ICD-10-CM | POA: Diagnosis not present

## 2018-02-22 DIAGNOSIS — G8929 Other chronic pain: Secondary | ICD-10-CM | POA: Diagnosis not present

## 2018-02-22 DIAGNOSIS — R4182 Altered mental status, unspecified: Secondary | ICD-10-CM | POA: Diagnosis not present

## 2018-02-23 NOTE — Telephone Encounter (Signed)
Can the Home health nurse obtain a culture from the wound if it has a foul smell once the old dressing has been removed

## 2018-02-23 NOTE — Telephone Encounter (Signed)
Spoke with Danae Chen, LPN with [facility] and she stated that the pt has been admitted at [facility].

## 2018-02-23 NOTE — Telephone Encounter (Signed)
Can your office please follow up with home health in regard to Dr. Lupita Dawn response?   Thanks [employer]

## 2018-02-24 MED ORDER — MELATONIN 3 MG PO TABS
3.00 | ORAL_TABLET | ORAL | Status: DC
Start: ? — End: 2018-02-24

## 2018-02-24 MED ORDER — HYDROMORPHONE HCL 1 MG/ML IJ SOLN
0.50 | INTRAMUSCULAR | Status: DC
Start: ? — End: 2018-02-24

## 2018-02-24 MED ORDER — GENERIC EXTERNAL MEDICATION
Status: DC
Start: ? — End: 2018-02-24

## 2018-02-24 MED ORDER — BISACODYL 10 MG RE SUPP
10.00 | RECTAL | Status: DC
Start: ? — End: 2018-02-24

## 2018-02-24 MED ORDER — LORAZEPAM 2 MG/ML IJ SOLN
.50 | INTRAMUSCULAR | Status: DC
Start: ? — End: 2018-02-24

## 2018-02-24 MED ORDER — SCOPOLAMINE 1 MG/3DAYS TD PT72
1.00 | MEDICATED_PATCH | TRANSDERMAL | Status: DC
Start: 2018-02-27 — End: 2018-02-24

## 2018-02-26 ENCOUNTER — Telehealth: Payer: Self-pay

## 2018-02-26 MED ORDER — GENERIC EXTERNAL MEDICATION
Status: DC
Start: ? — End: 2018-02-26

## 2018-02-26 MED ORDER — BISACODYL 10 MG RE SUPP
10.00 | RECTAL | Status: DC
Start: ? — End: 2018-02-26

## 2018-02-26 MED ORDER — SCOPOLAMINE 1 MG/3DAYS TD PT72
1.00 | MEDICATED_PATCH | TRANSDERMAL | Status: DC
Start: 2018-02-27 — End: 2018-02-26

## 2018-02-26 MED ORDER — HYDROMORPHONE HCL 1 MG/ML IJ SOLN
0.80 | INTRAMUSCULAR | Status: DC
Start: ? — End: 2018-02-26

## 2018-02-26 MED ORDER — LORAZEPAM 2 MG/ML IJ SOLN
.50 | INTRAMUSCULAR | Status: DC
Start: ? — End: 2018-02-26

## 2018-02-26 MED ORDER — ACETAMINOPHEN 650 MG RE SUPP
650.00 | RECTAL | Status: DC
Start: ? — End: 2018-02-26

## 2018-02-26 NOTE — Telephone Encounter (Signed)
Copied from [place] [phone]. Topic: General - Deceased Patient >> 03-26-18  8:39 AM Berneta Levins wrote: Reason for CRM:  Pt's daughter, Arvid Right, calling in.  States that patient passed away during the night.  Daughter hasn't seen official death certificate but believes date of death will be listed as 03-26-18. Kathlee Nations can be reached at [phone].  Route to department's PEC Pool.

## 2018-02-26 NOTE — Telephone Encounter (Deleted)
Copied from [place] [phone]. Topic: General - Deceased Patient >> March 02, 2018  8:39 AM Berneta Levins wrote: Reason for CRM:  Pt's daughter, Arvid Right, calling in.  States that patient passed away during the night.  Daughter hasn't seen official death certificate but believes date of death will be listed as 02-Mar-2018. Kathlee Nations can be reached at [phone].  Route to department's PEC Pool.

## 2018-02-27 ENCOUNTER — Ambulatory Visit: Payer: Medicare Other | Admitting: Physician Assistant

## 2018-02-28 ENCOUNTER — Other Ambulatory Visit: Payer: Self-pay | Admitting: Internal Medicine

## 2018-03-04 ENCOUNTER — Ambulatory Visit: Payer: Medicare Other | Admitting: Internal Medicine

## 2018-03-07 DEATH — deceased

## 2018-03-19 ENCOUNTER — Ambulatory Visit: Payer: Medicare Other

## 2018-04-05 DEATH — deceased

## 2018-04-07 ENCOUNTER — Other Ambulatory Visit: Payer: Self-pay | Admitting: Internal Medicine

## 2018-05-05 ENCOUNTER — Ambulatory Visit (INDEPENDENT_AMBULATORY_CARE_PROVIDER_SITE_OTHER): Payer: Medicare Other | Admitting: Vascular Surgery

## 2018-05-05 ENCOUNTER — Encounter (INDEPENDENT_AMBULATORY_CARE_PROVIDER_SITE_OTHER): Payer: Medicare Other

## 2018-10-03 IMAGING — DX DG ANKLE COMPLETE 3+V*R*
3 series · 3 of 3 positions shown · non-contrast
Comparison: None

CLINICAL DATA: Swelling RIGHT lower leg for 2 days, redness,
history diabetes mellitus, hypertension, coronary artery disease,
peripheral arterial disease

EXAM:
RIGHT ANKLE - COMPLETE 3+ VIEW

[ankle ap]
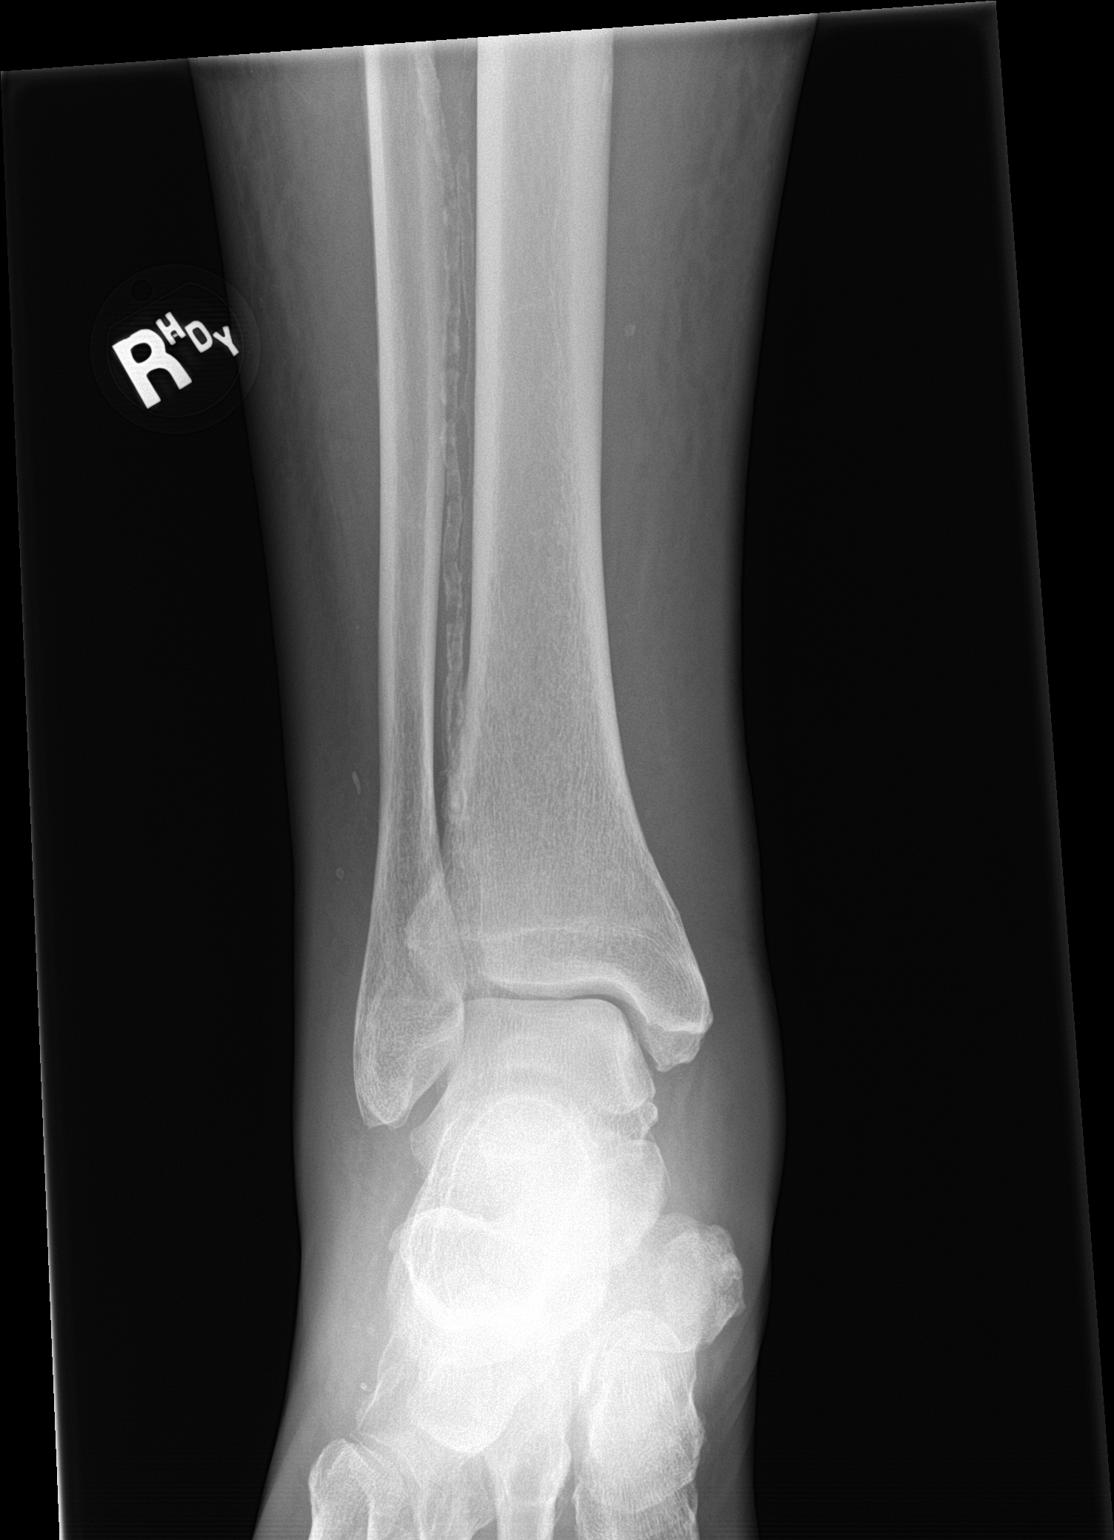

[ankle obl]
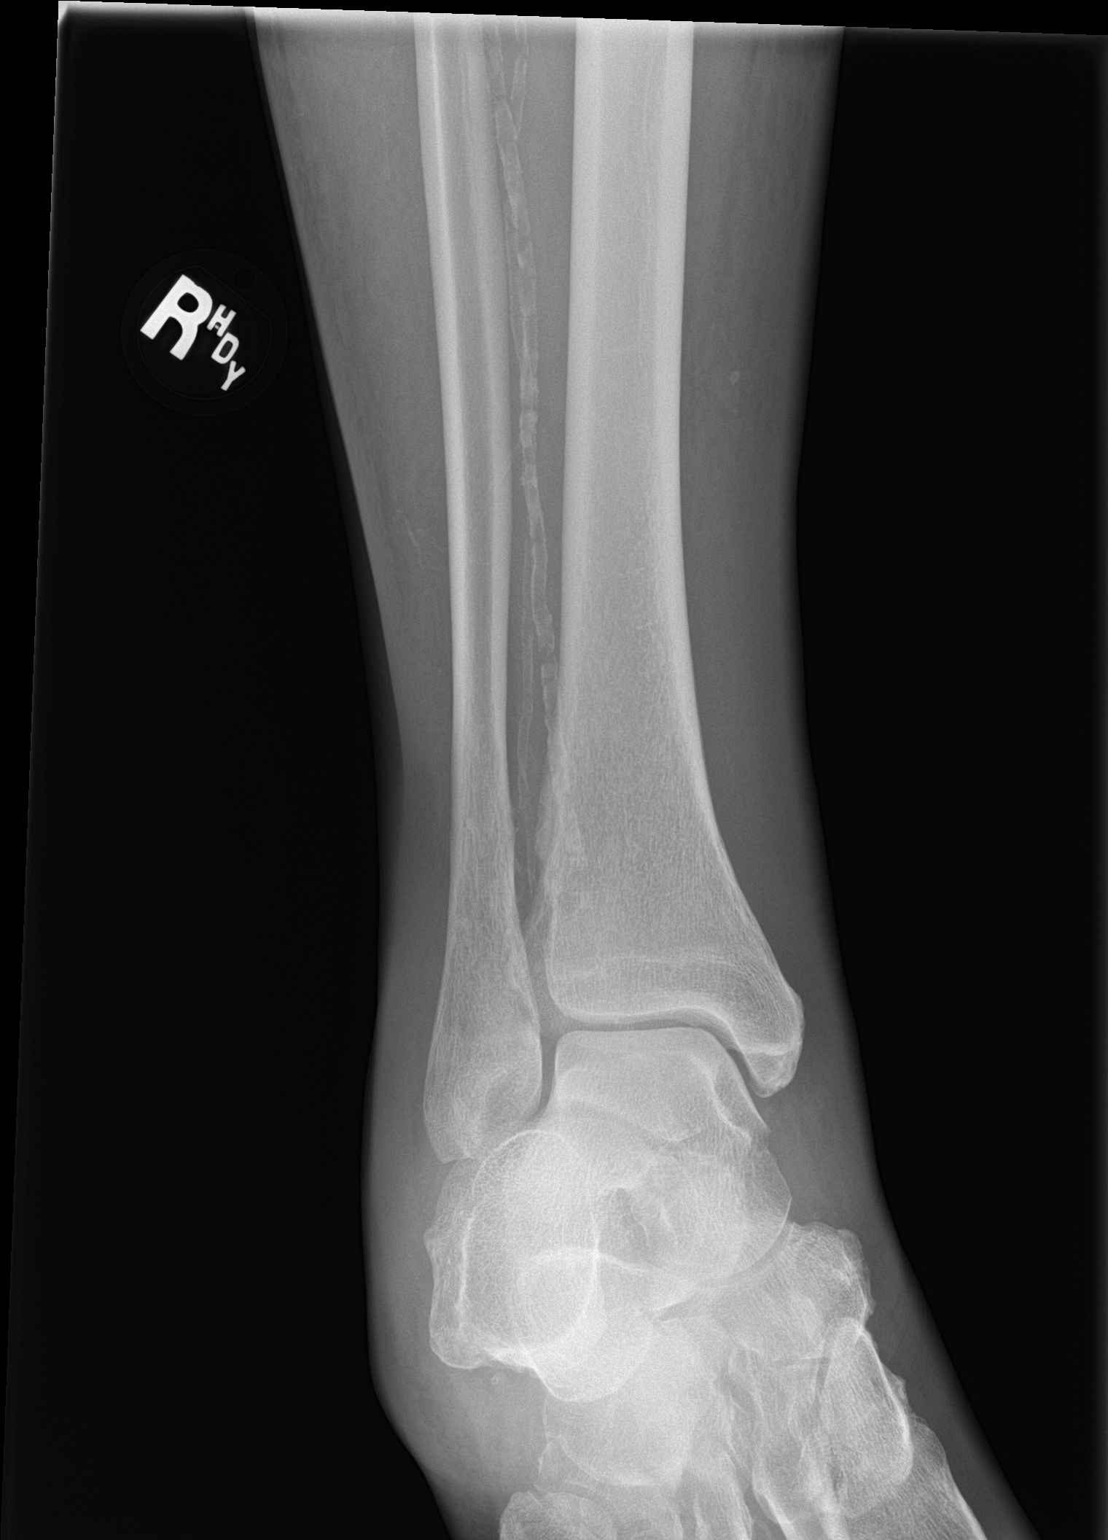

[ankle lat]
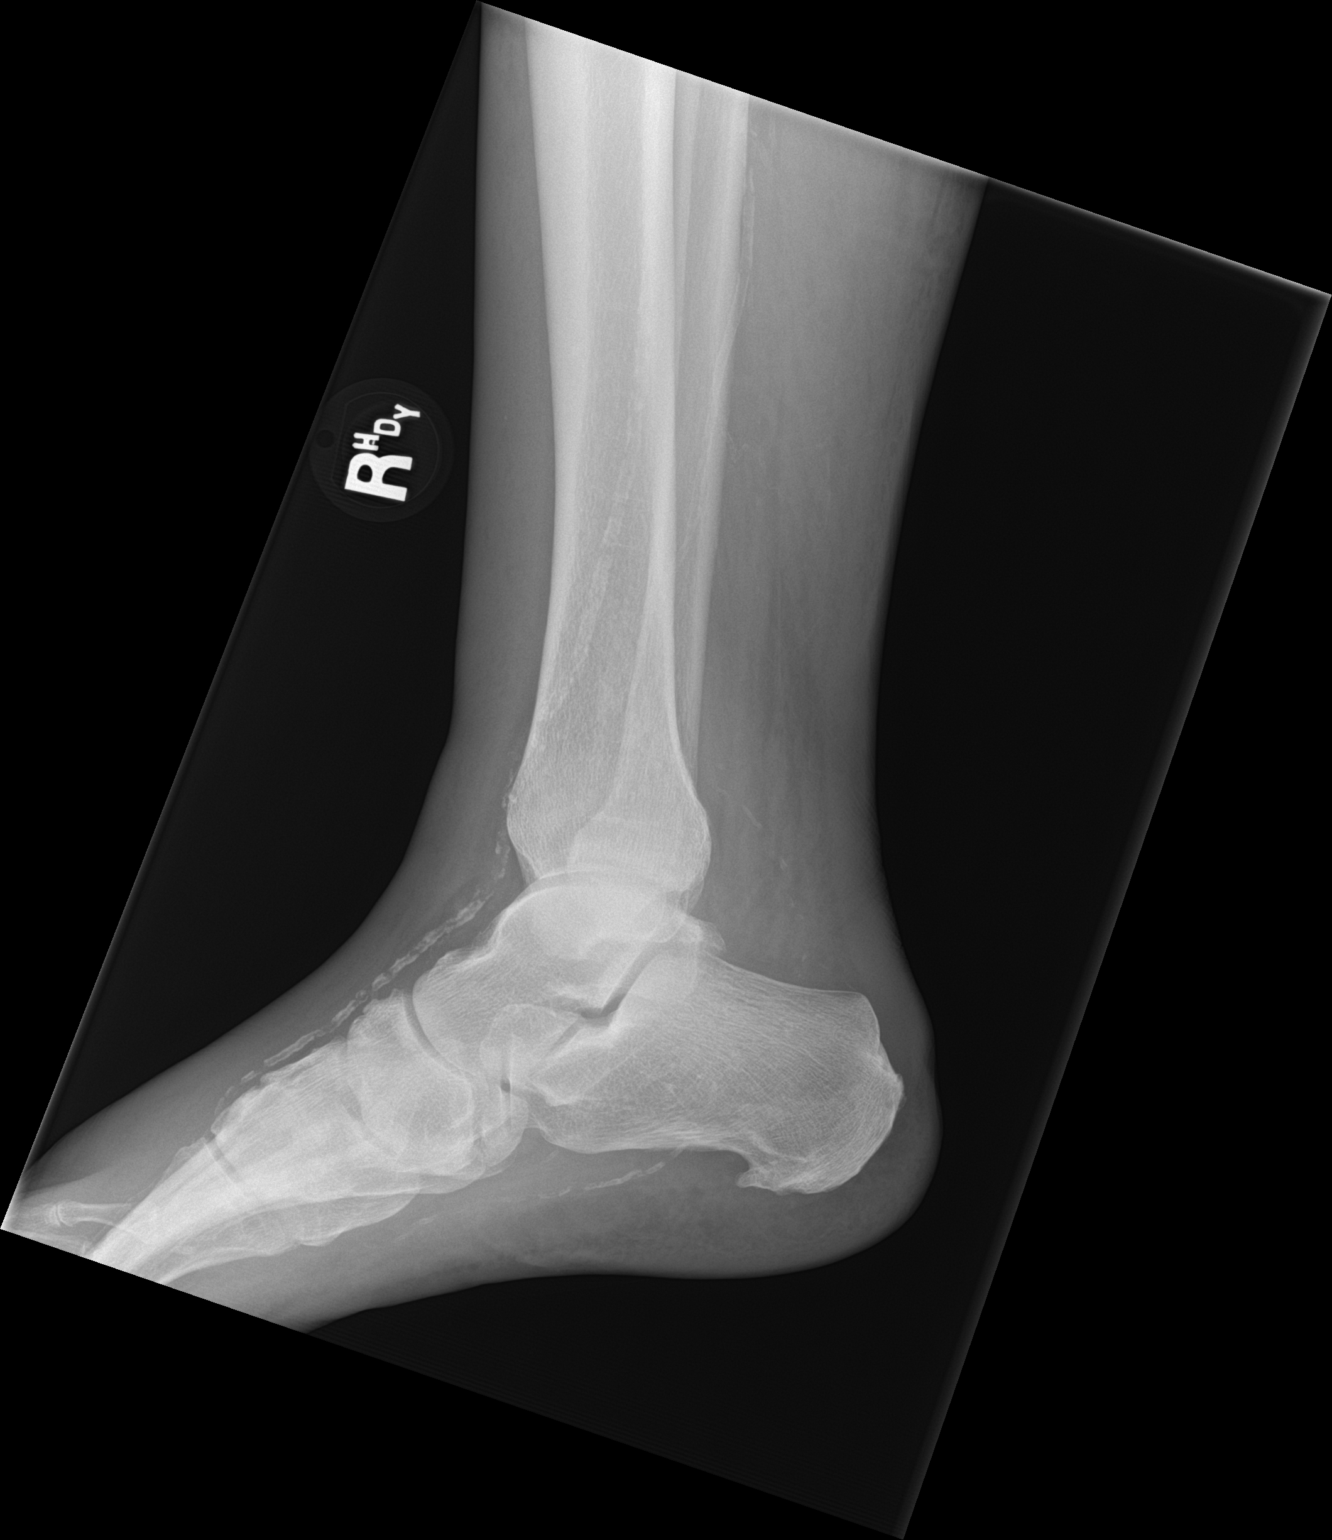

[3 of 3 positions shown; findings below may reference images not displayed]

FINDINGS: Scattered soft tissue swelling RIGHT lower leg and ankle especially
medial ankle.

Osseous mineralization normal.

Joint spaces preserved.

Small plantar calcaneal spur.

No acute fracture, dislocation, or bone destruction.

Extensive small vessel vascular calcifications at the RIGHT lower
leg into RIGHT foot.
IMPRESSION: Soft tissue swelling and extensive small vessel vascular
calcifications without acute osseous abnormalities.

## 2018-11-07 IMAGING — CR DG FOOT COMPLETE 3+V*L*
3 series · 3 of 3 positions shown · non-contrast
Comparison: CT LEFT foot October 22, 2017

CLINICAL DATA: Redness and swelling after toe amputation 2 weeks
ago.

EXAM:
LEFT FOOT - COMPLETE 3+ VIEW

[foot ap]
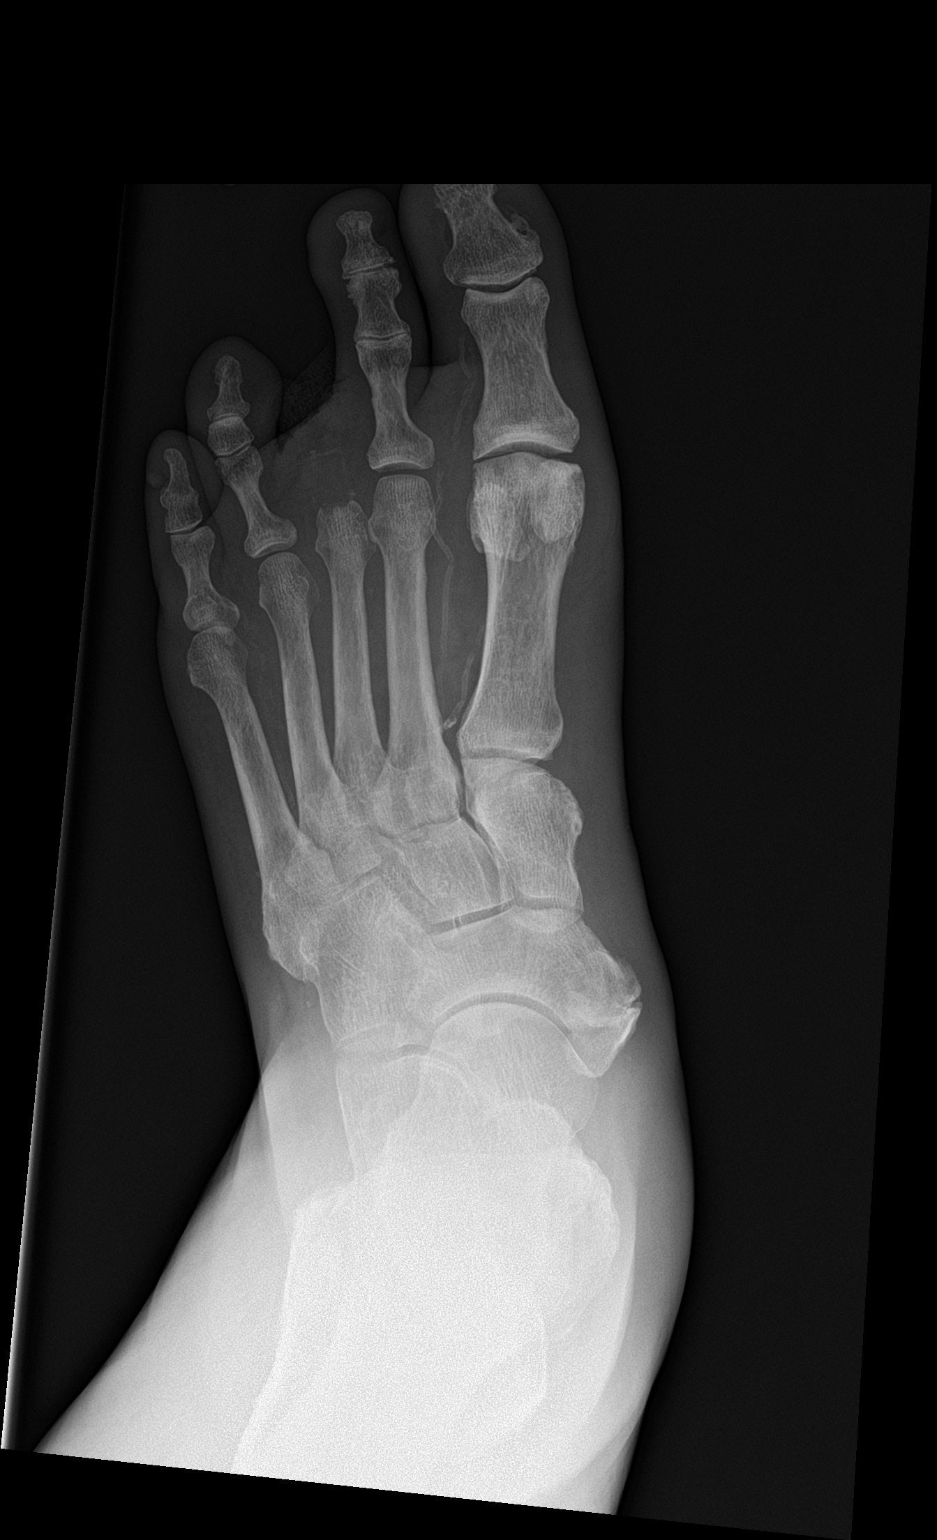

[foot obl]
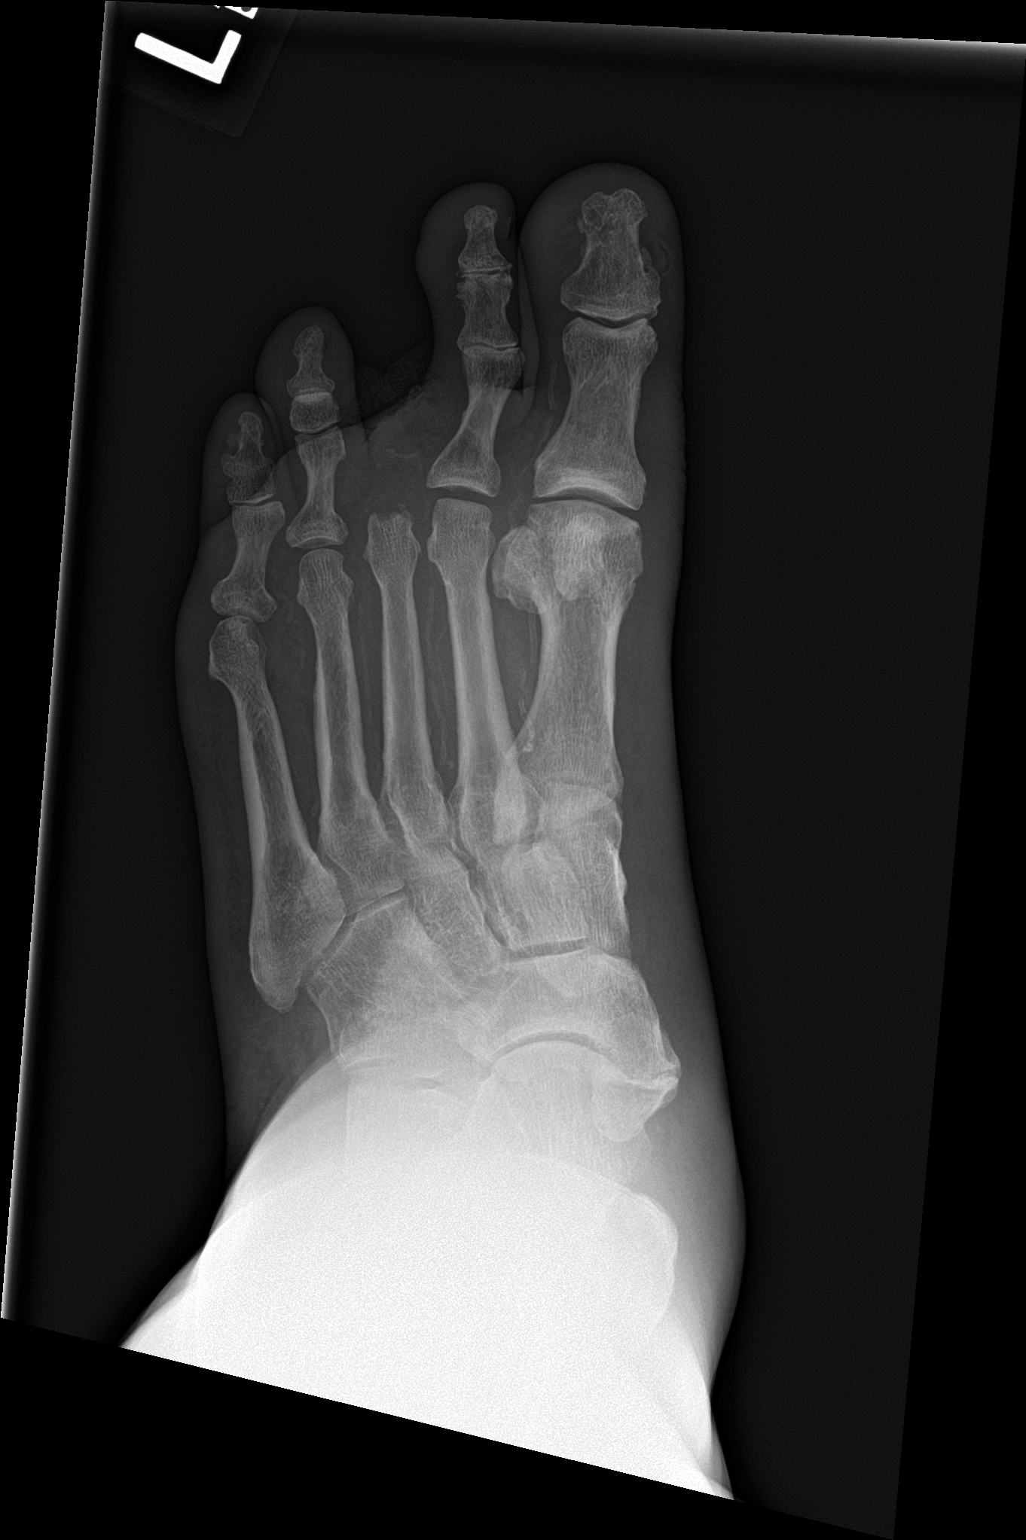

[foot lat]
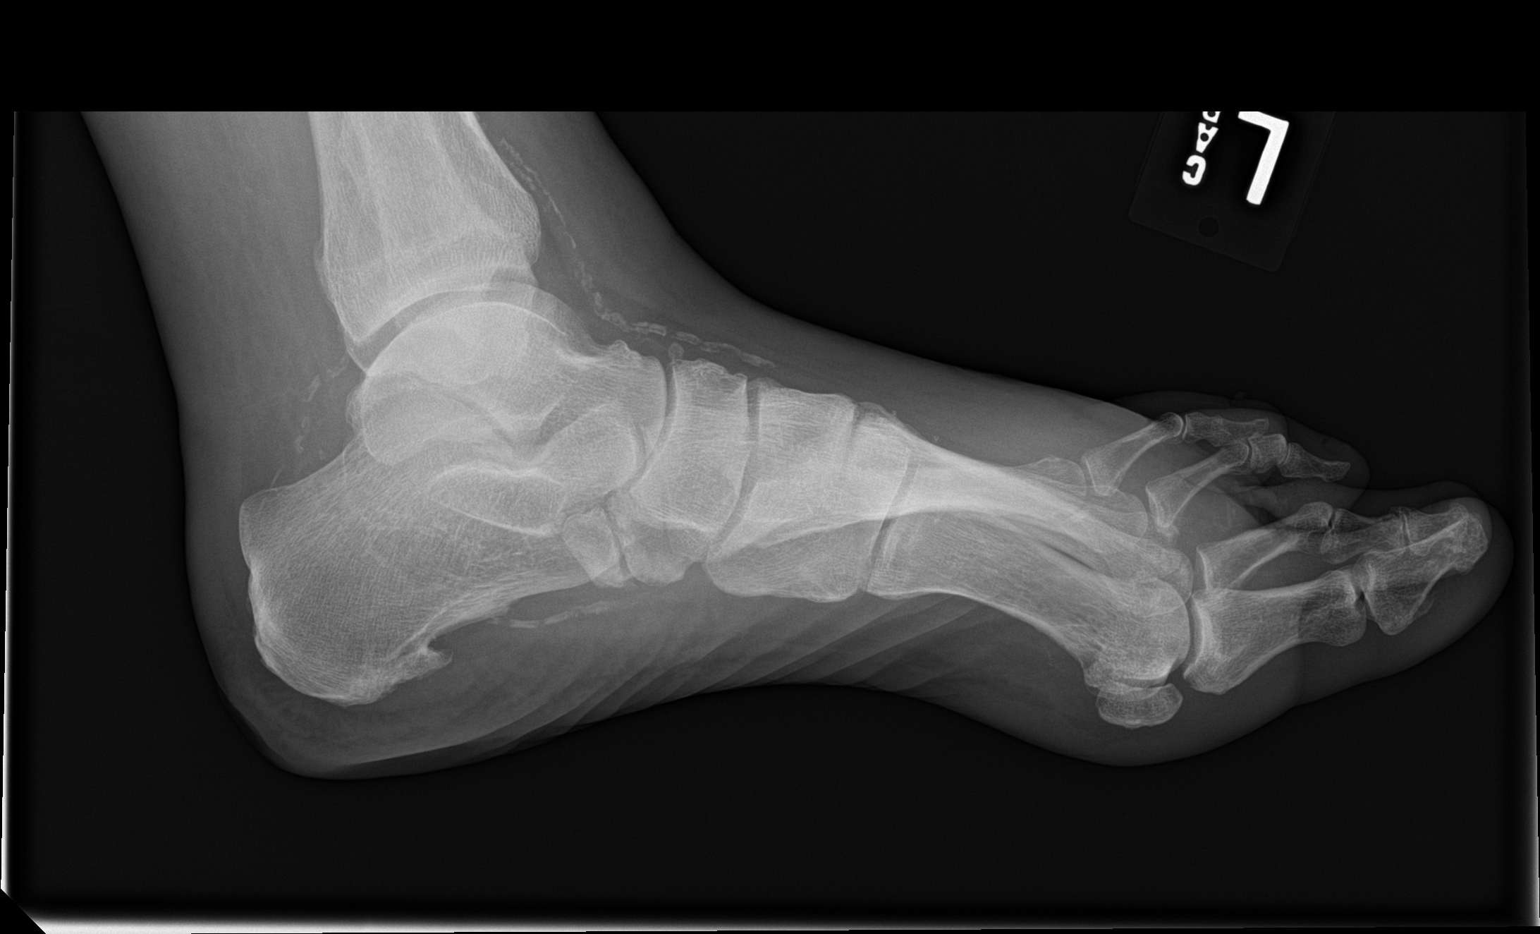

[3 of 3 positions shown; findings below may reference images not displayed]

FINDINGS: New erosive changes third metatarsal head, status post resection of
third phalanges. No acute fracture deformity. No dislocation. Mild
first MTP osteoarthrosis. Slight widening of the second MTP joint
space. Type 2 navicular bone. Severe vascular calcifications. Soft
tissue swelling without subcutaneous gas or radiopaque foreign
bodies.
IMPRESSION: 1. S/p third toe amputation. New postoperative defect third
metatarsal head versus osteomyelitis.
2. Second MTP joint space widening seen with effusion or
projectional artifact.
3. Soft tissue swelling, no subcutaneous gas.
